# Patient Record
Sex: Male | Born: 1935 | Race: White | Hispanic: No | Marital: Married | Smoking: Current every day smoker
Health system: Southern US, Community
[De-identification: ages and names within clinical notes are randomized; demographics above are authoritative.]

## PROBLEM LIST (undated history)

## (undated) DIAGNOSIS — Z9889 Other specified postprocedural states: Secondary | ICD-10-CM

## (undated) DIAGNOSIS — R112 Nausea with vomiting, unspecified: Secondary | ICD-10-CM

## (undated) DIAGNOSIS — I480 Paroxysmal atrial fibrillation: Secondary | ICD-10-CM

## (undated) DIAGNOSIS — I5032 Chronic diastolic (congestive) heart failure: Secondary | ICD-10-CM

## (undated) DIAGNOSIS — R011 Cardiac murmur, unspecified: Secondary | ICD-10-CM

## (undated) DIAGNOSIS — I10 Essential (primary) hypertension: Secondary | ICD-10-CM

## (undated) DIAGNOSIS — M069 Rheumatoid arthritis, unspecified: Secondary | ICD-10-CM

## (undated) DIAGNOSIS — J189 Pneumonia, unspecified organism: Secondary | ICD-10-CM

## (undated) DIAGNOSIS — I499 Cardiac arrhythmia, unspecified: Secondary | ICD-10-CM

## (undated) HISTORY — PX: PROSTATE SURGERY: SHX751

## (undated) HISTORY — PX: CATARACT EXTRACTION: SUR2

## (undated) HISTORY — DX: Paroxysmal atrial fibrillation: I48.0

## (undated) HISTORY — DX: Chronic diastolic (congestive) heart failure: I50.32

## (undated) HISTORY — DX: Rheumatoid arthritis, unspecified: M06.9

## (undated) HISTORY — DX: Essential (primary) hypertension: I10

## (undated) HISTORY — PX: COLONOSCOPY: SHX174

---

## 2011-03-12 DIAGNOSIS — M069 Rheumatoid arthritis, unspecified: Secondary | ICD-10-CM | POA: Diagnosis not present

## 2011-03-20 DIAGNOSIS — H40059 Ocular hypertension, unspecified eye: Secondary | ICD-10-CM | POA: Diagnosis not present

## 2011-03-20 DIAGNOSIS — H524 Presbyopia: Secondary | ICD-10-CM | POA: Diagnosis not present

## 2011-03-20 DIAGNOSIS — H1045 Other chronic allergic conjunctivitis: Secondary | ICD-10-CM | POA: Diagnosis not present

## 2011-03-20 DIAGNOSIS — H16229 Keratoconjunctivitis sicca, not specified as Sjogren's, unspecified eye: Secondary | ICD-10-CM | POA: Diagnosis not present

## 2011-03-20 DIAGNOSIS — H01009 Unspecified blepharitis unspecified eye, unspecified eyelid: Secondary | ICD-10-CM | POA: Diagnosis not present

## 2011-03-26 DIAGNOSIS — M069 Rheumatoid arthritis, unspecified: Secondary | ICD-10-CM | POA: Diagnosis not present

## 2011-04-12 DIAGNOSIS — M069 Rheumatoid arthritis, unspecified: Secondary | ICD-10-CM | POA: Diagnosis not present

## 2011-04-30 DIAGNOSIS — R918 Other nonspecific abnormal finding of lung field: Secondary | ICD-10-CM | POA: Diagnosis not present

## 2011-04-30 DIAGNOSIS — R5381 Other malaise: Secondary | ICD-10-CM | POA: Diagnosis not present

## 2011-04-30 DIAGNOSIS — J90 Pleural effusion, not elsewhere classified: Secondary | ICD-10-CM | POA: Diagnosis not present

## 2011-04-30 DIAGNOSIS — R5383 Other fatigue: Secondary | ICD-10-CM | POA: Diagnosis not present

## 2011-04-30 DIAGNOSIS — R509 Fever, unspecified: Secondary | ICD-10-CM | POA: Diagnosis not present

## 2011-04-30 DIAGNOSIS — M069 Rheumatoid arthritis, unspecified: Secondary | ICD-10-CM | POA: Diagnosis not present

## 2011-05-07 DIAGNOSIS — H40059 Ocular hypertension, unspecified eye: Secondary | ICD-10-CM | POA: Diagnosis not present

## 2011-05-10 DIAGNOSIS — M069 Rheumatoid arthritis, unspecified: Secondary | ICD-10-CM | POA: Diagnosis not present

## 2011-05-15 DIAGNOSIS — J189 Pneumonia, unspecified organism: Secondary | ICD-10-CM | POA: Diagnosis not present

## 2011-05-24 ENCOUNTER — Ambulatory Visit: Payer: Medicare Other | Admitting: Internal Medicine

## 2011-05-28 DIAGNOSIS — J90 Pleural effusion, not elsewhere classified: Secondary | ICD-10-CM | POA: Diagnosis not present

## 2011-05-28 DIAGNOSIS — R918 Other nonspecific abnormal finding of lung field: Secondary | ICD-10-CM | POA: Diagnosis not present

## 2011-05-28 DIAGNOSIS — J189 Pneumonia, unspecified organism: Secondary | ICD-10-CM | POA: Diagnosis not present

## 2011-06-05 ENCOUNTER — Encounter: Payer: Self-pay | Admitting: Internal Medicine

## 2011-06-05 ENCOUNTER — Ambulatory Visit
Admission: RE | Admit: 2011-06-05 | Discharge: 2011-06-05 | Disposition: A | Discharge status: Home / Self Care | Payer: Medicare Other | Source: Ambulatory Visit | Attending: Internal Medicine | Admitting: Internal Medicine

## 2011-06-05 ENCOUNTER — Ambulatory Visit (INDEPENDENT_AMBULATORY_CARE_PROVIDER_SITE_OTHER): Payer: Medicare Other | Admitting: Internal Medicine

## 2011-06-05 VITALS — BP 148/94 | HR 73 | Temp 97.9°F | Wt 151.0 lb

## 2011-06-05 DIAGNOSIS — H409 Unspecified glaucoma: Secondary | ICD-10-CM

## 2011-06-05 DIAGNOSIS — A150 Tuberculosis of lung: Secondary | ICD-10-CM | POA: Diagnosis not present

## 2011-06-05 DIAGNOSIS — I10 Essential (primary) hypertension: Secondary | ICD-10-CM | POA: Insufficient documentation

## 2011-06-05 DIAGNOSIS — M069 Rheumatoid arthritis, unspecified: Secondary | ICD-10-CM | POA: Insufficient documentation

## 2011-06-05 DIAGNOSIS — Z8701 Personal history of pneumonia (recurrent): Secondary | ICD-10-CM | POA: Diagnosis not present

## 2011-06-05 DIAGNOSIS — Z227 Latent tuberculosis: Secondary | ICD-10-CM

## 2011-06-05 DIAGNOSIS — R918 Other nonspecific abnormal finding of lung field: Secondary | ICD-10-CM | POA: Diagnosis not present

## 2011-06-05 MED ORDER — RIFAMPIN 300 MG PO CAPS: 300.0000 mg | ORAL_CAPSULE | Freq: Two times a day (BID) | ORAL | Status: AC

## 2011-06-05 NOTE — Progress Notes (Signed)
INFECTIOUS DISEASES INITIAL VISIT  RFV: positive quantiferon/latent TB in setting of using remicaide for RA  Subjective:    Patient ID: Cory Gonzalez, male    DOB: September 13, 1929, 78 y.o.   MRN: [id number]  HPI "Cory Gonzalez" is a pleasant 78yo Male with history of HTN, [name], currently on methotrexate and remicaide. He has received 2 doses of remicaide since end of February, but had delayed test results of positive quantiferon. The patient states that he used to work in funeral services and occasionally had to undergo TB skin testing. He states that his arm was often red, not raised, "didn't look right" but always passed. When he was started on enbrel in 2008, he did undergo a TB skin test which was also thought to be unreactive, although red appearing, and went on to getting immunomodulators. He feels that he may have been exposed to TB through his profession, but has never had systemic symptoms of cough, weight loss, nightsweats or fevers. He did however develop pneumonia, presenting with cough and fevers, in late March/early April for which he was treated with a course of levofloxacin for which he has improved completely. He had repeat CXR in late April which showed still residual abnormality from pneumonia.  Meds: mtx 10mg  weekly mvi daily Fish oil 1000mg  daily Amlodipine 10mg  daily Latanoprost QHS Tramadol PRN remicaide 100mg  3mg /kg, Q3months Active Ambulatory Problems    Diagnosis Date Noted  . 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 Rheumatoid arthritis 06/05/2011  . Hypertension 06/05/2011  . Glaucoma 06/05/2011   Resolved Ambulatory Problems    Diagnosis Date Noted  . No Resolved Ambulatory Problems   No Additional Past Medical History     Review of Systems  Constitutional: Negative for fever, chills, diaphoresis, activity change, appetite change, fatigue and unexpected weight change.  HENT: Negative for congestion, sore throat, rhinorrhea, sneezing, trouble  swallowing and sinus pressure.  Eyes: Negative for photophobia and visual disturbance.  Respiratory: Negative for cough, chest tightness, shortness of breath, wheezing and stridor.  Cardiovascular: Negative for chest pain, palpitations and leg swelling.  Gastrointestinal: Negative for nausea, vomiting, abdominal pain, diarrhea, constipation, blood in stool, abdominal distention and anal bleeding.  Genitourinary: Negative for dysuria, hematuria, flank pain and difficulty urinating.  Musculoskeletal: Negative for myalgias, back pain, joint swelling, arthralgias and gait problem.  Skin: Negative for color change, pallor, rash and wound.  Neurological: Negative for dizziness, tremors, weakness and light-headedness.  Hematological: Negative for adenopathy. Does not bruise/bleed easily.  Psychiatric/Behavioral: Negative for behavioral problems, confusion, sleep disturbance, dysphoric mood, decreased concentration and agitation.   Social history = married, lives outside of [place]. Previously worked in [employer]. Smokes 1-2 pipes daily. No drinking. No recent travel.   Family history = has no family status information on file.      Objective:   Physical Exam BP 148/94  Pulse 73  Temp(Src) 97.9 F (36.6 C) (Oral)  Wt 151 lb (68.493 kg)  General Appearance:    Alert, cooperative, no distress, appears stated age  Head:    Normocephalic, without obvious abnormality, atraumatic  Eyes:    PERRL, conjunctiva/corneas clear, EOM's intact,  Ears:    Normal TM's and external ear canals, both ears  Nose:   Nares normal, septum midline, mucosa normal, no drainage   or sinus tenderness  Throat:   Lips, mucosa, and tongue normal; teeth and gums normal  Neck:   Supple, symmetrical, trachea midline, no adenopathy;  Back:     Symmetric, no curvature, ROM normal, no CVA tenderness  Lungs:     Clear to auscultation bilaterally, respirations unlabored     Heart:    Regular rate and rhythm, S1  and S2 normal, no murmur, rub   or gallop  Abdomen:     Soft, non-tender, bowel sounds active all four quadrants,    no masses, no organomegaly        Extremities:   Extremities normal, atraumatic, no cyanosis or edema  Pulses:   2+ and symmetric all extremities  Skin:   Skin color, texture, turgor normal, no rashes or lesions  Lymph nodes:   Cervical, supraclavicular, and axillary nodes normal  Neurologic:   CNII-XII intact. Normal strength, sensation and reflexes      throughout         Assessment & Plan:  Latent tb = by history it appears that patient may have been exposed a few decades ago. His lifetime risk of having active TB is 5% by now, but he is receiving remicaide. We will advise to have him undergo tx for latent tb. we will do rif 300mg  BID x 4 months; will get cxr. Since patient does not have ongoing symptoms of fever, cough, it is less likely he has active TB. We have asked patient to call back if he has new onset fever, nightsweats, cough, jaundice, abdominal pain. Have given him precaution that his urine will become orange due to rifampin.  Drug interaction = we have reviewed his meds that it appears that it is ok to give rifampin with methotrexate.  Rheumatoid arthritis = would ask Dr. Anson [place] to hold on next few doses of remicaide ( next 4 months), until the patient finishes his latent tb treatment  SE surveillance =will check baseline LFTs and next check LFTS on weekly basis to ensure no liver inflammation associated with meds.  rtc in 2 months

## 2011-06-11 DIAGNOSIS — M069 Rheumatoid arthritis, unspecified: Secondary | ICD-10-CM | POA: Diagnosis not present

## 2011-06-11 DIAGNOSIS — R7612 Nonspecific reaction to cell mediated immunity measurement of gamma interferon antigen response without active tuberculosis: Secondary | ICD-10-CM | POA: Diagnosis not present

## 2011-06-29 DIAGNOSIS — R918 Other nonspecific abnormal finding of lung field: Secondary | ICD-10-CM | POA: Diagnosis not present

## 2011-06-29 DIAGNOSIS — J984 Other disorders of lung: Secondary | ICD-10-CM | POA: Diagnosis not present

## 2011-06-29 DIAGNOSIS — J189 Pneumonia, unspecified organism: Secondary | ICD-10-CM | POA: Diagnosis not present

## 2011-07-05 DIAGNOSIS — H40059 Ocular hypertension, unspecified eye: Secondary | ICD-10-CM | POA: Diagnosis not present

## 2011-07-17 DIAGNOSIS — M069 Rheumatoid arthritis, unspecified: Secondary | ICD-10-CM | POA: Diagnosis not present

## 2011-08-06 ENCOUNTER — Ambulatory Visit (INDEPENDENT_AMBULATORY_CARE_PROVIDER_SITE_OTHER): Payer: Medicare Other | Admitting: Internal Medicine

## 2011-08-06 ENCOUNTER — Encounter: Payer: Self-pay | Admitting: Internal Medicine

## 2011-08-06 VITALS — BP 115/66 | HR 62 | Temp 98.6°F | Wt 152.0 lb

## 2011-08-06 DIAGNOSIS — Z227 Latent tuberculosis: Secondary | ICD-10-CM

## 2011-08-06 DIAGNOSIS — A150 Tuberculosis of lung: Secondary | ICD-10-CM

## 2011-08-06 LAB — COMPREHENSIVE METABOLIC PANEL
AST: 27 U/L (ref 0–37)
Albumin: 3.4 g/dL — ABNORMAL LOW (ref 3.5–5.2)
Alkaline Phosphatase: 73 U/L (ref 39–117)
BUN: 10 mg/dL (ref 6–23)
Creat: 0.81 mg/dL (ref 0.50–1.35)
Glucose, Bld: 96 mg/dL (ref 70–99)
Potassium: 3.7 mEq/L (ref 3.5–5.3)

## 2011-08-07 NOTE — Progress Notes (Signed)
ID CLINIC VISIT  RFV: 2 month into latent TB treatment  Subjective:    Patient ID: Cory Gonzalez, male    DOB: 10/05/1929, 78 y.o.   MRN: [id number]  HPI "[name]" is a pleasant 78yo Male with history of HTN, [name], currently on methotrexate and remicaide. He has received 2 doses of remicaide since end of February, but had delayed test results of positive quantiferon. He was seen for the first time in our ID clinic 2 months ago for latent TB treatment and evaluation. The patient was started on rifampin 300mg  BID for which he has been taking regularly without any difficulty. He also took a baseline cxr at that time which showed some residual linear streaking to left upper/middle lobe likely from recent pneumonia. Since being off of remicaide the patient states that he has had 3 bouts of RA attacks, left hand, right hand and left knee where he takes pulse doses prednisone over a week. He last took prednisone roughly 2-3 weeks ago. He denies fever, chills, nightsweats, weight loss. He has a slight productive cough with clear phlegm that he has noticed. Otherwise, no difference in his energy level.  Meds: Rifampin 300mg  BID mtx 10mg  weekly  mvi daily  Fish oil 1000mg  daily  Amlodipine 10mg  daily  Latanoprost QHS  Tramadol PRN   Review of Systems Constitutional: Negative for fever, chills, diaphoresis, activity change, appetite change, fatigue and unexpected weight change.  HENT: Negative for congestion, sore throat, rhinorrhea, sneezing, trouble swallowing and sinus pressure.  Eyes: Negative for photophobia and visual disturbance.  Respiratory: Negative for cough, chest tightness, shortness of breath, wheezing and stridor.  Cardiovascular: Negative for chest pain, palpitations and leg swelling.  Gastrointestinal: Negative for nausea, vomiting, abdominal pain, diarrhea, constipation, blood in stool, abdominal distention and anal bleeding.  Genitourinary: Negative for  dysuria, hematuria, flank pain and difficulty urinating.  Musculoskeletal: + for arthralgias per hpi.  Skin: Negative for color change, pallor, rash and wound.  Neurological: Negative for dizziness, tremors, weakness and light-headedness.  Hematological: Negative for adenopathy. Does not bruise/bleed easily.  Psychiatric/Behavioral: Negative for behavioral problems, confusion, sleep disturbance, dysphoric mood, decreased concentration and agitation.       Objective:   Physical Exam  BP 115/66  Pulse 62  Temp 98.6 F (37 C) (Oral)  Wt 152 lb (68.947 kg) Physical Exam  Constitutional: He is oriented to person, place, and time. He appears well-developed and well-nourished. No distress.  HENT:  Mouth/Throat: Oropharynx is clear and moist. No oropharyngeal exudate.  Cardiovascular: Normal rate, regular rhythm and normal heart sounds. Exam reveals no gallop and no friction rub.  No murmur heard.  Pulmonary/Chest: Effort normal and breath sounds normal. No respiratory distress. He has no wheezes.  Abdominal: Soft. Bowel sounds are normal. He exhibits no distension. There is no tenderness.  Lymphadenopathy:  no cervical adenopathy.  Skin: Skin is warm and dry. No rash noted. No erythema.       Assessment & Plan:  1) latent TB = continue on 2 more months of rifampin to finish out course for latent TB. Will check CMP at this visit,  2) abn cxr = will repeat cxr at next visit to ensure changes from cxr in may are now resolved and will decide if need referral to pulmonology.  rtc in 2 months

## 2011-08-21 DIAGNOSIS — M069 Rheumatoid arthritis, unspecified: Secondary | ICD-10-CM | POA: Diagnosis not present

## 2011-08-21 DIAGNOSIS — R7612 Nonspecific reaction to cell mediated immunity measurement of gamma interferon antigen response without active tuberculosis: Secondary | ICD-10-CM | POA: Diagnosis not present

## 2011-08-31 DIAGNOSIS — M069 Rheumatoid arthritis, unspecified: Secondary | ICD-10-CM | POA: Diagnosis not present

## 2011-09-03 DIAGNOSIS — I10 Essential (primary) hypertension: Secondary | ICD-10-CM | POA: Diagnosis not present

## 2011-09-14 DIAGNOSIS — M069 Rheumatoid arthritis, unspecified: Secondary | ICD-10-CM | POA: Diagnosis not present

## 2011-09-17 DIAGNOSIS — I10 Essential (primary) hypertension: Secondary | ICD-10-CM | POA: Diagnosis not present

## 2011-10-03 DIAGNOSIS — N400 Enlarged prostate without lower urinary tract symptoms: Secondary | ICD-10-CM | POA: Diagnosis not present

## 2011-10-08 ENCOUNTER — Encounter: Payer: Self-pay | Admitting: Internal Medicine

## 2011-10-08 ENCOUNTER — Ambulatory Visit (INDEPENDENT_AMBULATORY_CARE_PROVIDER_SITE_OTHER): Payer: Medicare Other | Admitting: Internal Medicine

## 2011-10-08 ENCOUNTER — Ambulatory Visit
Admission: RE | Admit: 2011-10-08 | Discharge: 2011-10-08 | Disposition: A | Discharge status: Home / Self Care | Payer: Medicare Other | Source: Ambulatory Visit | Attending: Internal Medicine | Admitting: Internal Medicine

## 2011-10-08 VITALS — BP 122/67 | HR 52 | Temp 98.2°F | Wt 149.0 lb

## 2011-10-08 DIAGNOSIS — J449 Chronic obstructive pulmonary disease, unspecified: Secondary | ICD-10-CM | POA: Diagnosis not present

## 2011-10-08 DIAGNOSIS — Z227 Latent tuberculosis: Secondary | ICD-10-CM

## 2011-10-08 DIAGNOSIS — A150 Tuberculosis of lung: Secondary | ICD-10-CM

## 2011-10-08 NOTE — Assessment & Plan Note (Signed)
Patient has finished 4 month course of rifampin without difficulty. Repeat cxr today shows some improvement of lung infiltrate/scarring from his bout of pneumonia in the Spring.   - if needs to be re-screened for TB in the event of using other medications for RA, would recommend screening for pulmonary symptoms and cxr. His quantiferon testing will likely always remain positive.   - please note in his records that he finished a 4 month course of rifampin may to sept 2013 for latent TB

## 2011-10-08 NOTE — Progress Notes (Signed)
INFECTIOUS DISEASES CLINIC  RFV: completion of latent TB tx with rifampin x 4 months Subjective:    Patient ID: Laden Fieldhouse, male    DOB: 13-Nov-1929, 78 y.o.   MRN: [id number]  HPI "[name]" is a pleasant 78yo Male with history of HTN, [name], currently on methotrexate and remicaide. He has received 2 doses of remicaide since end of February, but had delayed test results of positive quantiferon.   He has just finished rifampin 300mg  BID x 4 months, last dose, last night for which he has been taking regularly without any difficulty. Since being off of remicaide the patient states that he has had 3 bouts of RA attacks, left hand, right hand and left knee where he has had 3 rounds of steroids. He was restarted on remicaide over the last month, and 3rd dose at the end of the week, then has a 2 month wait period. He denies fever, chills, nightsweats, weight loss. No cough. Otherwise, no difference in his energy level.  Joints not causing any problems after restarting remicaide. Currently getting tested LFTs q 2months.  Current Outpatient Prescriptions on File Prior to Visit  Medication Sig Dispense Refill  . amLODipine (NORVASC) 10 MG tablet Take 1 tablet by mouth Daily.      . Multiple Vitamin (MULITIVITAMIN WITH MINERALS) TABS Take 1 tablet by mouth daily.       Active Ambulatory Problems    Diagnosis Date Noted  . 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 Rheumatoid arthritis 06/05/2011  . Hypertension 06/05/2011   Resolved Ambulatory Problems    Diagnosis Date Noted  . Glaucoma 06/05/2011   No Additional Past Medical History     Review of Systems Constitutional: Negative for fever, chills, diaphoresis, activity change, appetite change, fatigue and unexpected weight change.  HENT: Negative for congestion, sore throat, rhinorrhea, sneezing, trouble swallowing and sinus pressure.  Eyes: Negative for photophobia and visual disturbance.  Respiratory: Negative for cough, chest  tightness, shortness of breath, wheezing and stridor.  Cardiovascular: Negative for chest pain, palpitations and leg swelling.  Gastrointestinal: Negative for nausea, vomiting, abdominal pain, diarrhea, constipation, blood in stool, abdominal distention and anal bleeding.  Genitourinary: Negative for dysuria, hematuria, flank pain and difficulty urinating.  Musculoskeletal: Negative for myalgias, back pain, joint swelling, arthralgias and gait problem.  Skin: Negative for color change, pallor, rash and wound.  Neurological: Negative for dizziness, tremors, weakness and light-headedness.  Hematological: Negative for adenopathy. Does not bruise/bleed easily.  Psychiatric/Behavioral: Negative for behavioral problems, confusion, sleep disturbance, dysphoric mood, decreased concentration and agitation.       Objective:   Physical Exam BP 122/67  Pulse 52  Temp 98.2 F (36.8 C) (Oral)  Wt 149 lb (67.586 kg) Physical Exam  Constitutional: He is oriented to person, place, and time. He appears well-developed and well-nourished. No distress.  HENT:  Mouth/Throat: Oropharynx is clear and moist. No oropharyngeal exudate.  Cardiovascular: Normal rate, regular rhythm and normal heart sounds.soft early systolic murmur BH apex. Pulmonary/Chest: Effort normal and breath sounds normal. No respiratory distress. He has no wheezes.  Abdominal: Soft. Bowel sounds are normal. He exhibits no distension. There is no tenderness.  Lymphadenopathy:  He has no cervical adenopathy.  Neurological: He is alert and oriented to person, place, and time.  Skin: Skin is warm and dry. No rash noted. No erythema.  Psychiatric: He has a normal mood and affect. His behavior is normal.   Imaging: cxr on 10/08/11: left lung area  looks slightly improved.      Assessment & Plan:  Cc: dr. Len Blalock. In [place] ([facility] . Dr. Dierdre Forth,

## 2011-10-10 DIAGNOSIS — N401 Enlarged prostate with lower urinary tract symptoms: Secondary | ICD-10-CM | POA: Diagnosis not present

## 2011-10-10 DIAGNOSIS — N529 Male erectile dysfunction, unspecified: Secondary | ICD-10-CM | POA: Diagnosis not present

## 2011-10-12 DIAGNOSIS — M069 Rheumatoid arthritis, unspecified: Secondary | ICD-10-CM | POA: Diagnosis not present

## 2011-10-22 DIAGNOSIS — R7612 Nonspecific reaction to cell mediated immunity measurement of gamma interferon antigen response without active tuberculosis: Secondary | ICD-10-CM | POA: Diagnosis not present

## 2011-10-22 DIAGNOSIS — Z23 Encounter for immunization: Secondary | ICD-10-CM | POA: Diagnosis not present

## 2011-10-22 DIAGNOSIS — M069 Rheumatoid arthritis, unspecified: Secondary | ICD-10-CM | POA: Diagnosis not present

## 2011-11-09 DIAGNOSIS — H40059 Ocular hypertension, unspecified eye: Secondary | ICD-10-CM | POA: Diagnosis not present

## 2011-12-07 DIAGNOSIS — M069 Rheumatoid arthritis, unspecified: Secondary | ICD-10-CM | POA: Diagnosis not present

## 2012-01-03 DIAGNOSIS — Z23 Encounter for immunization: Secondary | ICD-10-CM | POA: Diagnosis not present

## 2012-01-03 DIAGNOSIS — F17200 Nicotine dependence, unspecified, uncomplicated: Secondary | ICD-10-CM | POA: Diagnosis not present

## 2012-01-03 DIAGNOSIS — M5137 Other intervertebral disc degeneration, lumbosacral region: Secondary | ICD-10-CM | POA: Diagnosis not present

## 2012-01-03 DIAGNOSIS — N400 Enlarged prostate without lower urinary tract symptoms: Secondary | ICD-10-CM | POA: Diagnosis not present

## 2012-01-03 DIAGNOSIS — I10 Essential (primary) hypertension: Secondary | ICD-10-CM | POA: Diagnosis not present

## 2012-01-17 DIAGNOSIS — Z125 Encounter for screening for malignant neoplasm of prostate: Secondary | ICD-10-CM | POA: Diagnosis not present

## 2012-01-17 DIAGNOSIS — Z23 Encounter for immunization: Secondary | ICD-10-CM | POA: Diagnosis not present

## 2012-01-17 DIAGNOSIS — I10 Essential (primary) hypertension: Secondary | ICD-10-CM | POA: Diagnosis not present

## 2012-01-17 DIAGNOSIS — Z0000 Encounter for general adult medical examination without abnormal findings: Secondary | ICD-10-CM | POA: Diagnosis not present

## 2012-01-17 DIAGNOSIS — M069 Rheumatoid arthritis, unspecified: Secondary | ICD-10-CM | POA: Diagnosis not present

## 2012-01-31 DIAGNOSIS — N400 Enlarged prostate without lower urinary tract symptoms: Secondary | ICD-10-CM | POA: Diagnosis not present

## 2012-01-31 DIAGNOSIS — F17200 Nicotine dependence, unspecified, uncomplicated: Secondary | ICD-10-CM | POA: Diagnosis not present

## 2012-01-31 DIAGNOSIS — R011 Cardiac murmur, unspecified: Secondary | ICD-10-CM | POA: Diagnosis not present

## 2012-01-31 DIAGNOSIS — I10 Essential (primary) hypertension: Secondary | ICD-10-CM | POA: Diagnosis not present

## 2012-01-31 DIAGNOSIS — H6120 Impacted cerumen, unspecified ear: Secondary | ICD-10-CM | POA: Diagnosis not present

## 2012-02-06 DIAGNOSIS — M069 Rheumatoid arthritis, unspecified: Secondary | ICD-10-CM | POA: Diagnosis not present

## 2012-03-20 DIAGNOSIS — H40059 Ocular hypertension, unspecified eye: Secondary | ICD-10-CM | POA: Diagnosis not present

## 2012-04-01 DIAGNOSIS — M069 Rheumatoid arthritis, unspecified: Secondary | ICD-10-CM | POA: Diagnosis not present

## 2012-04-17 DIAGNOSIS — R7612 Nonspecific reaction to cell mediated immunity measurement of gamma interferon antigen response without active tuberculosis: Secondary | ICD-10-CM | POA: Diagnosis not present

## 2012-04-17 DIAGNOSIS — M069 Rheumatoid arthritis, unspecified: Secondary | ICD-10-CM | POA: Diagnosis not present

## 2012-05-27 DIAGNOSIS — M069 Rheumatoid arthritis, unspecified: Secondary | ICD-10-CM | POA: Diagnosis not present

## 2012-07-22 DIAGNOSIS — M069 Rheumatoid arthritis, unspecified: Secondary | ICD-10-CM | POA: Diagnosis not present

## 2012-07-24 DIAGNOSIS — H40059 Ocular hypertension, unspecified eye: Secondary | ICD-10-CM | POA: Diagnosis not present

## 2012-07-31 DIAGNOSIS — I10 Essential (primary) hypertension: Secondary | ICD-10-CM | POA: Diagnosis not present

## 2012-08-07 DIAGNOSIS — I10 Essential (primary) hypertension: Secondary | ICD-10-CM | POA: Diagnosis not present

## 2012-08-07 DIAGNOSIS — N400 Enlarged prostate without lower urinary tract symptoms: Secondary | ICD-10-CM | POA: Diagnosis not present

## 2012-08-07 DIAGNOSIS — F17200 Nicotine dependence, unspecified, uncomplicated: Secondary | ICD-10-CM | POA: Diagnosis not present

## 2012-08-07 DIAGNOSIS — M1990 Unspecified osteoarthritis, unspecified site: Secondary | ICD-10-CM | POA: Diagnosis not present

## 2012-08-23 DIAGNOSIS — W172XXA Fall into hole, initial encounter: Secondary | ICD-10-CM | POA: Diagnosis not present

## 2012-08-23 DIAGNOSIS — M25559 Pain in unspecified hip: Secondary | ICD-10-CM | POA: Diagnosis not present

## 2012-08-23 DIAGNOSIS — S79929A Unspecified injury of unspecified thigh, initial encounter: Secondary | ICD-10-CM | POA: Diagnosis not present

## 2012-08-23 DIAGNOSIS — IMO0001 Reserved for inherently not codable concepts without codable children: Secondary | ICD-10-CM | POA: Diagnosis not present

## 2012-09-16 DIAGNOSIS — T169XXA Foreign body in ear, unspecified ear, initial encounter: Secondary | ICD-10-CM | POA: Diagnosis not present

## 2012-09-16 DIAGNOSIS — M069 Rheumatoid arthritis, unspecified: Secondary | ICD-10-CM | POA: Diagnosis not present

## 2012-09-16 DIAGNOSIS — IMO0002 Reserved for concepts with insufficient information to code with codable children: Secondary | ICD-10-CM | POA: Diagnosis not present

## 2012-10-16 DIAGNOSIS — M25519 Pain in unspecified shoulder: Secondary | ICD-10-CM | POA: Diagnosis not present

## 2012-10-16 DIAGNOSIS — R7612 Nonspecific reaction to cell mediated immunity measurement of gamma interferon antigen response without active tuberculosis: Secondary | ICD-10-CM | POA: Diagnosis not present

## 2012-10-16 DIAGNOSIS — M069 Rheumatoid arthritis, unspecified: Secondary | ICD-10-CM | POA: Diagnosis not present

## 2012-10-23 DIAGNOSIS — N401 Enlarged prostate with lower urinary tract symptoms: Secondary | ICD-10-CM | POA: Diagnosis not present

## 2012-10-30 DIAGNOSIS — N529 Male erectile dysfunction, unspecified: Secondary | ICD-10-CM | POA: Diagnosis not present

## 2012-10-30 DIAGNOSIS — D126 Benign neoplasm of colon, unspecified: Secondary | ICD-10-CM | POA: Diagnosis not present

## 2012-10-30 DIAGNOSIS — N402 Nodular prostate without lower urinary tract symptoms: Secondary | ICD-10-CM | POA: Diagnosis not present

## 2012-10-30 DIAGNOSIS — N401 Enlarged prostate with lower urinary tract symptoms: Secondary | ICD-10-CM | POA: Diagnosis not present

## 2012-11-11 DIAGNOSIS — M069 Rheumatoid arthritis, unspecified: Secondary | ICD-10-CM | POA: Diagnosis not present

## 2012-11-24 DIAGNOSIS — H40059 Ocular hypertension, unspecified eye: Secondary | ICD-10-CM | POA: Diagnosis not present

## 2012-11-26 DIAGNOSIS — M25519 Pain in unspecified shoulder: Secondary | ICD-10-CM | POA: Diagnosis not present

## 2012-11-26 DIAGNOSIS — S43429A Sprain of unspecified rotator cuff capsule, initial encounter: Secondary | ICD-10-CM | POA: Diagnosis not present

## 2012-11-27 DIAGNOSIS — M25519 Pain in unspecified shoulder: Secondary | ICD-10-CM | POA: Diagnosis not present

## 2013-01-06 DIAGNOSIS — M069 Rheumatoid arthritis, unspecified: Secondary | ICD-10-CM | POA: Diagnosis not present

## 2013-02-03 DIAGNOSIS — Z125 Encounter for screening for malignant neoplasm of prostate: Secondary | ICD-10-CM | POA: Diagnosis not present

## 2013-02-03 DIAGNOSIS — Z0000 Encounter for general adult medical examination without abnormal findings: Secondary | ICD-10-CM | POA: Diagnosis not present

## 2013-02-03 DIAGNOSIS — I10 Essential (primary) hypertension: Secondary | ICD-10-CM | POA: Diagnosis not present

## 2013-02-03 DIAGNOSIS — F17200 Nicotine dependence, unspecified, uncomplicated: Secondary | ICD-10-CM | POA: Diagnosis not present

## 2013-02-03 DIAGNOSIS — Z1331 Encounter for screening for depression: Secondary | ICD-10-CM | POA: Diagnosis not present

## 2013-02-12 DIAGNOSIS — N400 Enlarged prostate without lower urinary tract symptoms: Secondary | ICD-10-CM | POA: Diagnosis not present

## 2013-02-12 DIAGNOSIS — N183 Chronic kidney disease, stage 3 unspecified: Secondary | ICD-10-CM | POA: Diagnosis not present

## 2013-02-12 DIAGNOSIS — F17200 Nicotine dependence, unspecified, uncomplicated: Secondary | ICD-10-CM | POA: Diagnosis not present

## 2013-02-12 DIAGNOSIS — I10 Essential (primary) hypertension: Secondary | ICD-10-CM | POA: Diagnosis not present

## 2013-02-12 DIAGNOSIS — R011 Cardiac murmur, unspecified: Secondary | ICD-10-CM | POA: Diagnosis not present

## 2013-02-13 ENCOUNTER — Other Ambulatory Visit (HOSPITAL_COMMUNITY): Payer: Self-pay | Admitting: Internal Medicine

## 2013-02-13 DIAGNOSIS — R011 Cardiac murmur, unspecified: Secondary | ICD-10-CM

## 2013-02-16 DIAGNOSIS — R7612 Nonspecific reaction to cell mediated immunity measurement of gamma interferon antigen response without active tuberculosis: Secondary | ICD-10-CM | POA: Diagnosis not present

## 2013-02-16 DIAGNOSIS — M069 Rheumatoid arthritis, unspecified: Secondary | ICD-10-CM | POA: Diagnosis not present

## 2013-02-16 DIAGNOSIS — M25519 Pain in unspecified shoulder: Secondary | ICD-10-CM | POA: Diagnosis not present

## 2013-02-26 ENCOUNTER — Ambulatory Visit (HOSPITAL_COMMUNITY)
Admission: RE | Admit: 2013-02-26 | Discharge: 2013-02-26 | Disposition: A | Discharge status: Home / Self Care | Payer: Medicare Other | Source: Ambulatory Visit | Attending: Cardiovascular Disease | Admitting: Cardiovascular Disease

## 2013-02-26 DIAGNOSIS — I369 Nonrheumatic tricuspid valve disorder, unspecified: Secondary | ICD-10-CM | POA: Diagnosis not present

## 2013-02-26 DIAGNOSIS — R011 Cardiac murmur, unspecified: Secondary | ICD-10-CM | POA: Diagnosis not present

## 2013-02-26 NOTE — Progress Notes (Signed)
2D Echo Performed 02/26/2013    Makari Sanko, RCS  

## 2013-03-03 DIAGNOSIS — M069 Rheumatoid arthritis, unspecified: Secondary | ICD-10-CM | POA: Diagnosis not present

## 2013-03-23 DIAGNOSIS — H40059 Ocular hypertension, unspecified eye: Secondary | ICD-10-CM | POA: Diagnosis not present

## 2013-03-23 DIAGNOSIS — H26499 Other secondary cataract, unspecified eye: Secondary | ICD-10-CM | POA: Diagnosis not present

## 2013-04-30 DIAGNOSIS — M069 Rheumatoid arthritis, unspecified: Secondary | ICD-10-CM | POA: Diagnosis not present

## 2013-05-07 DIAGNOSIS — M79609 Pain in unspecified limb: Secondary | ICD-10-CM | POA: Diagnosis not present

## 2013-05-14 DIAGNOSIS — M79609 Pain in unspecified limb: Secondary | ICD-10-CM | POA: Diagnosis not present

## 2013-05-14 DIAGNOSIS — S93609A Unspecified sprain of unspecified foot, initial encounter: Secondary | ICD-10-CM | POA: Diagnosis not present

## 2013-06-15 DIAGNOSIS — M7730 Calcaneal spur, unspecified foot: Secondary | ICD-10-CM | POA: Diagnosis not present

## 2013-06-15 DIAGNOSIS — M069 Rheumatoid arthritis, unspecified: Secondary | ICD-10-CM | POA: Diagnosis not present

## 2013-06-15 DIAGNOSIS — M25519 Pain in unspecified shoulder: Secondary | ICD-10-CM | POA: Diagnosis not present

## 2013-06-15 DIAGNOSIS — R7612 Nonspecific reaction to cell mediated immunity measurement of gamma interferon antigen response without active tuberculosis: Secondary | ICD-10-CM | POA: Diagnosis not present

## 2013-06-15 DIAGNOSIS — M79609 Pain in unspecified limb: Secondary | ICD-10-CM | POA: Diagnosis not present

## 2013-06-25 DIAGNOSIS — M069 Rheumatoid arthritis, unspecified: Secondary | ICD-10-CM | POA: Diagnosis not present

## 2013-07-21 DIAGNOSIS — H40059 Ocular hypertension, unspecified eye: Secondary | ICD-10-CM | POA: Diagnosis not present

## 2013-08-13 DIAGNOSIS — I10 Essential (primary) hypertension: Secondary | ICD-10-CM | POA: Diagnosis not present

## 2013-08-18 DIAGNOSIS — M069 Rheumatoid arthritis, unspecified: Secondary | ICD-10-CM | POA: Diagnosis not present

## 2013-08-20 DIAGNOSIS — N400 Enlarged prostate without lower urinary tract symptoms: Secondary | ICD-10-CM | POA: Diagnosis not present

## 2013-08-20 DIAGNOSIS — F17200 Nicotine dependence, unspecified, uncomplicated: Secondary | ICD-10-CM | POA: Diagnosis not present

## 2013-08-20 DIAGNOSIS — N183 Chronic kidney disease, stage 3 unspecified: Secondary | ICD-10-CM | POA: Diagnosis not present

## 2013-08-20 DIAGNOSIS — I10 Essential (primary) hypertension: Secondary | ICD-10-CM | POA: Diagnosis not present

## 2013-09-07 DIAGNOSIS — M25519 Pain in unspecified shoulder: Secondary | ICD-10-CM | POA: Diagnosis not present

## 2013-09-07 DIAGNOSIS — M069 Rheumatoid arthritis, unspecified: Secondary | ICD-10-CM | POA: Diagnosis not present

## 2013-09-07 DIAGNOSIS — R7612 Nonspecific reaction to cell mediated immunity measurement of gamma interferon antigen response without active tuberculosis: Secondary | ICD-10-CM | POA: Diagnosis not present

## 2013-09-07 DIAGNOSIS — L989 Disorder of the skin and subcutaneous tissue, unspecified: Secondary | ICD-10-CM | POA: Diagnosis not present

## 2013-09-21 DIAGNOSIS — D235 Other benign neoplasm of skin of trunk: Secondary | ICD-10-CM | POA: Diagnosis not present

## 2013-09-21 DIAGNOSIS — C44319 Basal cell carcinoma of skin of other parts of face: Secondary | ICD-10-CM | POA: Diagnosis not present

## 2013-09-21 DIAGNOSIS — L821 Other seborrheic keratosis: Secondary | ICD-10-CM | POA: Diagnosis not present

## 2013-09-21 DIAGNOSIS — I781 Nevus, non-neoplastic: Secondary | ICD-10-CM | POA: Diagnosis not present

## 2013-10-13 DIAGNOSIS — M069 Rheumatoid arthritis, unspecified: Secondary | ICD-10-CM | POA: Diagnosis not present

## 2013-10-19 DIAGNOSIS — Z85828 Personal history of other malignant neoplasm of skin: Secondary | ICD-10-CM | POA: Diagnosis not present

## 2013-10-19 DIAGNOSIS — L570 Actinic keratosis: Secondary | ICD-10-CM | POA: Diagnosis not present

## 2013-11-16 DIAGNOSIS — H40053 Ocular hypertension, bilateral: Secondary | ICD-10-CM | POA: Diagnosis not present

## 2013-12-08 DIAGNOSIS — Z23 Encounter for immunization: Secondary | ICD-10-CM | POA: Diagnosis not present

## 2013-12-08 DIAGNOSIS — M0589 Other rheumatoid arthritis with rheumatoid factor of multiple sites: Secondary | ICD-10-CM | POA: Diagnosis not present

## 2013-12-16 DIAGNOSIS — C44519 Basal cell carcinoma of skin of other part of trunk: Secondary | ICD-10-CM | POA: Diagnosis not present

## 2014-01-07 DIAGNOSIS — M0589 Other rheumatoid arthritis with rheumatoid factor of multiple sites: Secondary | ICD-10-CM | POA: Diagnosis not present

## 2014-01-07 DIAGNOSIS — M25511 Pain in right shoulder: Secondary | ICD-10-CM | POA: Diagnosis not present

## 2014-01-07 DIAGNOSIS — R7612 Nonspecific reaction to cell mediated immunity measurement of gamma interferon antigen response without active tuberculosis: Secondary | ICD-10-CM | POA: Diagnosis not present

## 2014-01-07 DIAGNOSIS — M25512 Pain in left shoulder: Secondary | ICD-10-CM | POA: Diagnosis not present

## 2014-02-02 DIAGNOSIS — M0589 Other rheumatoid arthritis with rheumatoid factor of multiple sites: Secondary | ICD-10-CM | POA: Diagnosis not present

## 2014-02-08 DIAGNOSIS — Z85828 Personal history of other malignant neoplasm of skin: Secondary | ICD-10-CM | POA: Diagnosis not present

## 2014-02-08 DIAGNOSIS — Z08 Encounter for follow-up examination after completed treatment for malignant neoplasm: Secondary | ICD-10-CM | POA: Diagnosis not present

## 2014-02-08 DIAGNOSIS — L570 Actinic keratosis: Secondary | ICD-10-CM | POA: Diagnosis not present

## 2014-02-08 DIAGNOSIS — X32XXXD Exposure to sunlight, subsequent encounter: Secondary | ICD-10-CM | POA: Diagnosis not present

## 2014-02-11 DIAGNOSIS — Z125 Encounter for screening for malignant neoplasm of prostate: Secondary | ICD-10-CM | POA: Diagnosis not present

## 2014-02-11 DIAGNOSIS — Z1389 Encounter for screening for other disorder: Secondary | ICD-10-CM | POA: Diagnosis not present

## 2014-02-11 DIAGNOSIS — F17290 Nicotine dependence, other tobacco product, uncomplicated: Secondary | ICD-10-CM | POA: Diagnosis not present

## 2014-02-11 DIAGNOSIS — Z0000 Encounter for general adult medical examination without abnormal findings: Secondary | ICD-10-CM | POA: Diagnosis not present

## 2014-02-11 DIAGNOSIS — E538 Deficiency of other specified B group vitamins: Secondary | ICD-10-CM | POA: Diagnosis not present

## 2014-02-11 DIAGNOSIS — Z23 Encounter for immunization: Secondary | ICD-10-CM | POA: Diagnosis not present

## 2014-02-11 DIAGNOSIS — I10 Essential (primary) hypertension: Secondary | ICD-10-CM | POA: Diagnosis not present

## 2014-02-12 DIAGNOSIS — H4011X4 Primary open-angle glaucoma, indeterminate stage: Secondary | ICD-10-CM | POA: Diagnosis not present

## 2014-02-18 DIAGNOSIS — I10 Essential (primary) hypertension: Secondary | ICD-10-CM | POA: Diagnosis not present

## 2014-02-18 DIAGNOSIS — N183 Chronic kidney disease, stage 3 (moderate): Secondary | ICD-10-CM | POA: Diagnosis not present

## 2014-02-18 DIAGNOSIS — F17290 Nicotine dependence, other tobacco product, uncomplicated: Secondary | ICD-10-CM | POA: Diagnosis not present

## 2014-02-18 DIAGNOSIS — R010 Benign and innocent cardiac murmurs: Secondary | ICD-10-CM | POA: Diagnosis not present

## 2014-02-18 DIAGNOSIS — N400 Enlarged prostate without lower urinary tract symptoms: Secondary | ICD-10-CM | POA: Diagnosis not present

## 2014-03-23 DIAGNOSIS — H40053 Ocular hypertension, bilateral: Secondary | ICD-10-CM | POA: Diagnosis not present

## 2014-03-23 DIAGNOSIS — H1045 Other chronic allergic conjunctivitis: Secondary | ICD-10-CM | POA: Diagnosis not present

## 2014-03-30 DIAGNOSIS — M0589 Other rheumatoid arthritis with rheumatoid factor of multiple sites: Secondary | ICD-10-CM | POA: Diagnosis not present

## 2014-05-10 DIAGNOSIS — C4491 Basal cell carcinoma of skin, unspecified: Secondary | ICD-10-CM | POA: Diagnosis not present

## 2014-05-10 DIAGNOSIS — M150 Primary generalized (osteo)arthritis: Secondary | ICD-10-CM | POA: Diagnosis not present

## 2014-05-10 DIAGNOSIS — M0589 Other rheumatoid arthritis with rheumatoid factor of multiple sites: Secondary | ICD-10-CM | POA: Diagnosis not present

## 2014-05-10 DIAGNOSIS — R7612 Nonspecific reaction to cell mediated immunity measurement of gamma interferon antigen response without active tuberculosis: Secondary | ICD-10-CM | POA: Diagnosis not present

## 2014-05-10 DIAGNOSIS — M25519 Pain in unspecified shoulder: Secondary | ICD-10-CM | POA: Diagnosis not present

## 2014-05-25 DIAGNOSIS — M0589 Other rheumatoid arthritis with rheumatoid factor of multiple sites: Secondary | ICD-10-CM | POA: Diagnosis not present

## 2014-07-21 DIAGNOSIS — M0589 Other rheumatoid arthritis with rheumatoid factor of multiple sites: Secondary | ICD-10-CM | POA: Diagnosis not present

## 2014-07-26 DIAGNOSIS — H40053 Ocular hypertension, bilateral: Secondary | ICD-10-CM | POA: Diagnosis not present

## 2014-08-12 DIAGNOSIS — I10 Essential (primary) hypertension: Secondary | ICD-10-CM | POA: Diagnosis not present

## 2014-08-12 DIAGNOSIS — N400 Enlarged prostate without lower urinary tract symptoms: Secondary | ICD-10-CM | POA: Diagnosis not present

## 2014-08-19 DIAGNOSIS — N183 Chronic kidney disease, stage 3 (moderate): Secondary | ICD-10-CM | POA: Diagnosis not present

## 2014-08-19 DIAGNOSIS — I129 Hypertensive chronic kidney disease with stage 1 through stage 4 chronic kidney disease, or unspecified chronic kidney disease: Secondary | ICD-10-CM | POA: Diagnosis not present

## 2014-08-19 DIAGNOSIS — J449 Chronic obstructive pulmonary disease, unspecified: Secondary | ICD-10-CM | POA: Diagnosis not present

## 2014-08-19 DIAGNOSIS — F17290 Nicotine dependence, other tobacco product, uncomplicated: Secondary | ICD-10-CM | POA: Diagnosis not present

## 2014-08-19 DIAGNOSIS — I5032 Chronic diastolic (congestive) heart failure: Secondary | ICD-10-CM | POA: Diagnosis not present

## 2014-08-24 DIAGNOSIS — M545 Low back pain: Secondary | ICD-10-CM | POA: Diagnosis not present

## 2014-08-24 DIAGNOSIS — M9904 Segmental and somatic dysfunction of sacral region: Secondary | ICD-10-CM | POA: Diagnosis not present

## 2014-08-24 DIAGNOSIS — G541 Lumbosacral plexus disorders: Secondary | ICD-10-CM | POA: Diagnosis not present

## 2014-08-31 DIAGNOSIS — M9904 Segmental and somatic dysfunction of sacral region: Secondary | ICD-10-CM | POA: Diagnosis not present

## 2014-08-31 DIAGNOSIS — M545 Low back pain: Secondary | ICD-10-CM | POA: Diagnosis not present

## 2014-08-31 DIAGNOSIS — M5126 Other intervertebral disc displacement, lumbar region: Secondary | ICD-10-CM | POA: Diagnosis not present

## 2014-08-31 DIAGNOSIS — M5416 Radiculopathy, lumbar region: Secondary | ICD-10-CM | POA: Diagnosis not present

## 2014-09-02 DIAGNOSIS — M0589 Other rheumatoid arthritis with rheumatoid factor of multiple sites: Secondary | ICD-10-CM | POA: Diagnosis not present

## 2014-09-02 DIAGNOSIS — Z79899 Other long term (current) drug therapy: Secondary | ICD-10-CM | POA: Diagnosis not present

## 2014-09-07 DIAGNOSIS — S336XXA Sprain of sacroiliac joint, initial encounter: Secondary | ICD-10-CM | POA: Diagnosis not present

## 2014-09-07 DIAGNOSIS — M545 Low back pain: Secondary | ICD-10-CM | POA: Diagnosis not present

## 2014-09-07 DIAGNOSIS — M5416 Radiculopathy, lumbar region: Secondary | ICD-10-CM | POA: Diagnosis not present

## 2014-09-07 DIAGNOSIS — M9904 Segmental and somatic dysfunction of sacral region: Secondary | ICD-10-CM | POA: Diagnosis not present

## 2014-09-14 DIAGNOSIS — M4806 Spinal stenosis, lumbar region: Secondary | ICD-10-CM | POA: Diagnosis not present

## 2014-09-14 DIAGNOSIS — M9904 Segmental and somatic dysfunction of sacral region: Secondary | ICD-10-CM | POA: Diagnosis not present

## 2014-09-14 DIAGNOSIS — M5126 Other intervertebral disc displacement, lumbar region: Secondary | ICD-10-CM | POA: Diagnosis not present

## 2014-09-14 DIAGNOSIS — M5416 Radiculopathy, lumbar region: Secondary | ICD-10-CM | POA: Diagnosis not present

## 2014-09-14 DIAGNOSIS — M545 Low back pain: Secondary | ICD-10-CM | POA: Diagnosis not present

## 2014-09-15 DIAGNOSIS — M0589 Other rheumatoid arthritis with rheumatoid factor of multiple sites: Secondary | ICD-10-CM | POA: Diagnosis not present

## 2014-09-15 DIAGNOSIS — R7612 Nonspecific reaction to cell mediated immunity measurement of gamma interferon antigen response without active tuberculosis: Secondary | ICD-10-CM | POA: Diagnosis not present

## 2014-09-15 DIAGNOSIS — R5383 Other fatigue: Secondary | ICD-10-CM | POA: Diagnosis not present

## 2014-09-17 DIAGNOSIS — S7002XA Contusion of left hip, initial encounter: Secondary | ICD-10-CM | POA: Diagnosis not present

## 2014-09-21 DIAGNOSIS — M9904 Segmental and somatic dysfunction of sacral region: Secondary | ICD-10-CM | POA: Diagnosis not present

## 2014-09-21 DIAGNOSIS — S336XXA Sprain of sacroiliac joint, initial encounter: Secondary | ICD-10-CM | POA: Diagnosis not present

## 2014-09-21 DIAGNOSIS — M5416 Radiculopathy, lumbar region: Secondary | ICD-10-CM | POA: Diagnosis not present

## 2014-09-21 DIAGNOSIS — M545 Low back pain: Secondary | ICD-10-CM | POA: Diagnosis not present

## 2014-09-28 DIAGNOSIS — M4806 Spinal stenosis, lumbar region: Secondary | ICD-10-CM | POA: Diagnosis not present

## 2014-09-28 DIAGNOSIS — M5126 Other intervertebral disc displacement, lumbar region: Secondary | ICD-10-CM | POA: Diagnosis not present

## 2014-09-28 DIAGNOSIS — M9904 Segmental and somatic dysfunction of sacral region: Secondary | ICD-10-CM | POA: Diagnosis not present

## 2014-09-28 DIAGNOSIS — M545 Low back pain: Secondary | ICD-10-CM | POA: Diagnosis not present

## 2014-09-28 DIAGNOSIS — M5416 Radiculopathy, lumbar region: Secondary | ICD-10-CM | POA: Diagnosis not present

## 2014-10-07 DIAGNOSIS — M797 Fibromyalgia: Secondary | ICD-10-CM | POA: Diagnosis not present

## 2014-10-07 DIAGNOSIS — M9903 Segmental and somatic dysfunction of lumbar region: Secondary | ICD-10-CM | POA: Diagnosis not present

## 2014-10-07 DIAGNOSIS — M545 Low back pain: Secondary | ICD-10-CM | POA: Diagnosis not present

## 2014-10-28 DIAGNOSIS — M4806 Spinal stenosis, lumbar region: Secondary | ICD-10-CM | POA: Diagnosis not present

## 2014-10-28 DIAGNOSIS — M9904 Segmental and somatic dysfunction of sacral region: Secondary | ICD-10-CM | POA: Diagnosis not present

## 2014-10-28 DIAGNOSIS — M5126 Other intervertebral disc displacement, lumbar region: Secondary | ICD-10-CM | POA: Diagnosis not present

## 2014-10-28 DIAGNOSIS — M5416 Radiculopathy, lumbar region: Secondary | ICD-10-CM | POA: Diagnosis not present

## 2014-10-28 DIAGNOSIS — M545 Low back pain: Secondary | ICD-10-CM | POA: Diagnosis not present

## 2014-11-04 DIAGNOSIS — M9904 Segmental and somatic dysfunction of sacral region: Secondary | ICD-10-CM | POA: Diagnosis not present

## 2014-11-04 DIAGNOSIS — M5416 Radiculopathy, lumbar region: Secondary | ICD-10-CM | POA: Diagnosis not present

## 2014-11-04 DIAGNOSIS — M4806 Spinal stenosis, lumbar region: Secondary | ICD-10-CM | POA: Diagnosis not present

## 2014-11-04 DIAGNOSIS — M5126 Other intervertebral disc displacement, lumbar region: Secondary | ICD-10-CM | POA: Diagnosis not present

## 2014-11-10 DIAGNOSIS — M0589 Other rheumatoid arthritis with rheumatoid factor of multiple sites: Secondary | ICD-10-CM | POA: Diagnosis not present

## 2014-11-10 DIAGNOSIS — R5383 Other fatigue: Secondary | ICD-10-CM | POA: Diagnosis not present

## 2014-11-24 DIAGNOSIS — M9904 Segmental and somatic dysfunction of sacral region: Secondary | ICD-10-CM | POA: Diagnosis not present

## 2014-11-24 DIAGNOSIS — M5432 Sciatica, left side: Secondary | ICD-10-CM | POA: Diagnosis not present

## 2014-11-24 DIAGNOSIS — M9903 Segmental and somatic dysfunction of lumbar region: Secondary | ICD-10-CM | POA: Diagnosis not present

## 2014-11-24 DIAGNOSIS — M545 Low back pain: Secondary | ICD-10-CM | POA: Diagnosis not present

## 2014-11-30 DIAGNOSIS — H40053 Ocular hypertension, bilateral: Secondary | ICD-10-CM | POA: Diagnosis not present

## 2014-12-02 ENCOUNTER — Ambulatory Visit (INDEPENDENT_AMBULATORY_CARE_PROVIDER_SITE_OTHER): Payer: Medicare Other | Admitting: Internal Medicine

## 2014-12-02 ENCOUNTER — Encounter: Payer: Self-pay | Admitting: Internal Medicine

## 2014-12-02 ENCOUNTER — Ambulatory Visit
Admission: RE | Admit: 2014-12-02 | Discharge: 2014-12-02 | Disposition: A | Discharge status: Home / Self Care | Payer: Medicare Other | Source: Ambulatory Visit | Attending: Internal Medicine | Admitting: Internal Medicine

## 2014-12-02 VITALS — BP 109/74 | HR 60 | Temp 97.7°F | Ht 66.0 in | Wt 143.0 lb

## 2014-12-02 DIAGNOSIS — R7611 Nonspecific reaction to tuberculin skin test without active tuberculosis: Secondary | ICD-10-CM

## 2014-12-02 DIAGNOSIS — Z227 Latent tuberculosis: Secondary | ICD-10-CM

## 2014-12-02 DIAGNOSIS — J449 Chronic obstructive pulmonary disease, unspecified: Secondary | ICD-10-CM | POA: Diagnosis not present

## 2014-12-02 NOTE — Progress Notes (Signed)
Rfv: latent tb treatment and recent weight loss Subjective:    Patient ID: Cory Gonzalez, male    DOB: [birth year], 81 y.o.   MRN: [id number]  HPI Cory Gonzalez is an 81yo M with Hx of rheumatoid arthritis for which he takes mtx as well as humira injections. He was treated for LTBI in Summer of 2013 for which he finished 4 month course of rifampin. He is establishing care with a new rheumatologist who drew basic labs, hepatitis panel and quantiferon. Since his quantiferon was positive, he was referred back to the ID clinic.  The patient denies any productive cough, fever, chills, nightsweats. Did have 10lb weight loss over the summer, that appears to be intentional  Overall doing well with his health.  I have reviewed his clinic paperwork from rheumatologist and labs recently drawn in mid october  No Known Allergies  Current outpatient prescriptions:  .  amLODipine (NORVASC) 10 MG tablet, Take 1 tablet by mouth Daily., Disp: , Rfl:  .  celecoxib (CELEBREX) 200 MG capsule, , Disp: , Rfl: 0 .  folic acid (FOLVITE) 1 MG tablet, Take 1 mg by mouth 2 (two) times daily., Disp: , Rfl:  .  InFLIXimab (REMICADE IV), Inject into the vein every 8 (eight) weeks., Disp: , Rfl:  .  latanoprost (XALATAN) 0.005 % ophthalmic solution, , Disp: , Rfl: 10 .  lisinopril-hydrochlorothiazide (PRINZIDE,ZESTORETIC) 10-12.5 MG tablet, , Disp: , Rfl: 0 .  methotrexate (RHEUMATREX) 2.5 MG tablet, , Disp: , Rfl: 1 .  Multiple Vitamin (MULITIVITAMIN WITH MINERALS) TABS, Take 1 tablet by mouth daily., Disp: , Rfl:  .  Omega-3 Fatty Acids (FISH OIL) 1000 MG CAPS, Take 1 capsule by mouth daily., Disp: , Rfl:  .  vitamin C (ASCORBIC ACID) 500 MG tablet, Take 500 mg by mouth daily., Disp: , Rfl:   Active Ambulatory Problems    Diagnosis Date Noted  . 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 Rheumatoid arthritis(714.0) 06/05/2011  . Hypertension 06/05/2011   Resolved Ambulatory Problems    Diagnosis Date Noted  . Glaucoma 06/05/2011     No Additional Past Medical History   Social History  Substance Use Topics  . Smoking status: Current Every Day Smoker -- 50 years    Types: Pipe  . Smokeless tobacco: Former Systems developer  . Alcohol Use: No  family history is not on file. no hx of TB in the family Review of Systems  Constitutional: Negative for fever, chills, diaphoresis, activity change, appetite change, fatigue and unexpected weight change.  HENT: Negative for congestion, sore throat, rhinorrhea, sneezing, trouble swallowing and sinus pressure.  Eyes: Negative for photophobia and visual disturbance.  Respiratory: Negative for cough, chest tightness, shortness of breath, wheezing and stridor.  Cardiovascular: Negative for chest pain, palpitations and leg swelling.  Gastrointestinal: Negative for nausea, vomiting, abdominal pain, diarrhea, constipation, blood in stool, abdominal distention and anal bleeding.  Genitourinary: Negative for dysuria, hematuria, flank pain and difficulty urinating.  Musculoskeletal: Negative for myalgias, back pain, joint swelling, arthralgias and gait problem.  Skin: Negative for color change, pallor, rash and wound.  Neurological: Negative for dizziness, tremors, weakness and light-headedness.  Hematological: Negative for adenopathy. Does not bruise/bleed easily.  Psychiatric/Behavioral: Negative for behavioral problems, confusion, sleep disturbance, dysphoric mood, decreased concentration and agitation.       Objective:   Physical Exam BP 109/74 mmHg  Pulse 60  Temp(Src) 97.7 F (36.5 C)  Ht 5\' 6"  (1.676 m)  Wt 143 lb (64.864 kg)  BMI 23.09 kg/m2  Physical Exam  Constitutional: He is oriented to person, place, and time. He appears well-developed and well-nourished. No distress.  HENT:  Mouth/Throat: Oropharynx is clear and moist. No oropharyngeal exudate.  Cardiovascular: Normal rate, regular rhythm and normal heart sounds. Exam reveals no gallop and no friction rub.  No murmur heard.   Pulmonary/Chest: Effort normal and breath sounds normal. No respiratory distress. He has no wheezes.  Lymphadenopathy:  He has no cervical adenopathy.  Neurological: He is alert and oriented to person, place, and time.  Skin: Skin is warm and dry. No rash noted. No erythema.  Psychiatric: He has a normal mood and affect. His behavior is normal.   Imaging 11/3: cxr looks unchanged from prior,  FINDINGS: Stable left and right lung base scarring. Decreased atelectasis left upper lobe with mild residual scarring.  Markedly uncoiling of the aorta unchanged. Heart size normal. Vascular pattern normal. Hyperinflation suggests COPD.  IMPRESSION: No acute findings. COPD with bilateral mild scarring.       Assessment & Plan:  HX of LTBI =Will get cxr tosee if any new lesions that would make one suspect active TB. Presently he does not subscribe to symptoms suggestive of active disease  He finished taking ltbi treatment in 2013, no need to repeat treatment. Can continue with RA treatment  Since CXR is negative, recommend to come back to clinic only if needs to or has symptoms of fevers, chills, nightsweat, SOB, cough

## 2014-12-15 DIAGNOSIS — M797 Fibromyalgia: Secondary | ICD-10-CM | POA: Diagnosis not present

## 2014-12-15 DIAGNOSIS — M545 Low back pain: Secondary | ICD-10-CM | POA: Diagnosis not present

## 2014-12-15 DIAGNOSIS — M9904 Segmental and somatic dysfunction of sacral region: Secondary | ICD-10-CM | POA: Diagnosis not present

## 2014-12-15 DIAGNOSIS — M9903 Segmental and somatic dysfunction of lumbar region: Secondary | ICD-10-CM | POA: Diagnosis not present

## 2014-12-28 DIAGNOSIS — R42 Dizziness and giddiness: Secondary | ICD-10-CM | POA: Diagnosis not present

## 2014-12-28 DIAGNOSIS — I10 Essential (primary) hypertension: Secondary | ICD-10-CM | POA: Diagnosis not present

## 2015-01-03 DIAGNOSIS — Z79899 Other long term (current) drug therapy: Secondary | ICD-10-CM | POA: Diagnosis not present

## 2015-01-03 DIAGNOSIS — M0589 Other rheumatoid arthritis with rheumatoid factor of multiple sites: Secondary | ICD-10-CM | POA: Diagnosis not present

## 2015-01-03 DIAGNOSIS — I129 Hypertensive chronic kidney disease with stage 1 through stage 4 chronic kidney disease, or unspecified chronic kidney disease: Secondary | ICD-10-CM | POA: Diagnosis not present

## 2015-01-03 DIAGNOSIS — M25512 Pain in left shoulder: Secondary | ICD-10-CM | POA: Diagnosis not present

## 2015-01-05 DIAGNOSIS — M4806 Spinal stenosis, lumbar region: Secondary | ICD-10-CM | POA: Diagnosis not present

## 2015-01-05 DIAGNOSIS — M5416 Radiculopathy, lumbar region: Secondary | ICD-10-CM | POA: Diagnosis not present

## 2015-01-05 DIAGNOSIS — M9904 Segmental and somatic dysfunction of sacral region: Secondary | ICD-10-CM | POA: Diagnosis not present

## 2015-01-05 DIAGNOSIS — M545 Low back pain: Secondary | ICD-10-CM | POA: Diagnosis not present

## 2015-02-02 DIAGNOSIS — M797 Fibromyalgia: Secondary | ICD-10-CM | POA: Diagnosis not present

## 2015-02-02 DIAGNOSIS — M9904 Segmental and somatic dysfunction of sacral region: Secondary | ICD-10-CM | POA: Diagnosis not present

## 2015-02-02 DIAGNOSIS — M9903 Segmental and somatic dysfunction of lumbar region: Secondary | ICD-10-CM | POA: Diagnosis not present

## 2015-02-02 DIAGNOSIS — M545 Low back pain: Secondary | ICD-10-CM | POA: Diagnosis not present

## 2015-02-17 DIAGNOSIS — Z0000 Encounter for general adult medical examination without abnormal findings: Secondary | ICD-10-CM | POA: Diagnosis not present

## 2015-02-17 DIAGNOSIS — Z1389 Encounter for screening for other disorder: Secondary | ICD-10-CM | POA: Diagnosis not present

## 2015-02-17 DIAGNOSIS — M25519 Pain in unspecified shoulder: Secondary | ICD-10-CM | POA: Diagnosis not present

## 2015-02-17 DIAGNOSIS — Z125 Encounter for screening for malignant neoplasm of prostate: Secondary | ICD-10-CM | POA: Diagnosis not present

## 2015-02-17 DIAGNOSIS — I129 Hypertensive chronic kidney disease with stage 1 through stage 4 chronic kidney disease, or unspecified chronic kidney disease: Secondary | ICD-10-CM | POA: Diagnosis not present

## 2015-02-17 DIAGNOSIS — M069 Rheumatoid arthritis, unspecified: Secondary | ICD-10-CM | POA: Diagnosis not present

## 2015-02-28 DIAGNOSIS — M0589 Other rheumatoid arthritis with rheumatoid factor of multiple sites: Secondary | ICD-10-CM | POA: Diagnosis not present

## 2015-03-02 DIAGNOSIS — M797 Fibromyalgia: Secondary | ICD-10-CM | POA: Diagnosis not present

## 2015-03-02 DIAGNOSIS — M9904 Segmental and somatic dysfunction of sacral region: Secondary | ICD-10-CM | POA: Diagnosis not present

## 2015-03-02 DIAGNOSIS — M545 Low back pain: Secondary | ICD-10-CM | POA: Diagnosis not present

## 2015-03-02 DIAGNOSIS — M9903 Segmental and somatic dysfunction of lumbar region: Secondary | ICD-10-CM | POA: Diagnosis not present

## 2015-03-30 DIAGNOSIS — M9904 Segmental and somatic dysfunction of sacral region: Secondary | ICD-10-CM | POA: Diagnosis not present

## 2015-03-30 DIAGNOSIS — M9903 Segmental and somatic dysfunction of lumbar region: Secondary | ICD-10-CM | POA: Diagnosis not present

## 2015-03-30 DIAGNOSIS — M797 Fibromyalgia: Secondary | ICD-10-CM | POA: Diagnosis not present

## 2015-03-30 DIAGNOSIS — M545 Low back pain: Secondary | ICD-10-CM | POA: Diagnosis not present

## 2015-04-01 DIAGNOSIS — H52203 Unspecified astigmatism, bilateral: Secondary | ICD-10-CM | POA: Diagnosis not present

## 2015-04-01 DIAGNOSIS — H40053 Ocular hypertension, bilateral: Secondary | ICD-10-CM | POA: Diagnosis not present

## 2015-04-01 DIAGNOSIS — H26492 Other secondary cataract, left eye: Secondary | ICD-10-CM | POA: Diagnosis not present

## 2015-04-01 DIAGNOSIS — Z961 Presence of intraocular lens: Secondary | ICD-10-CM | POA: Diagnosis not present

## 2015-04-04 DIAGNOSIS — M0589 Other rheumatoid arthritis with rheumatoid factor of multiple sites: Secondary | ICD-10-CM | POA: Diagnosis not present

## 2015-04-04 DIAGNOSIS — Z79899 Other long term (current) drug therapy: Secondary | ICD-10-CM | POA: Diagnosis not present

## 2015-04-04 DIAGNOSIS — M25512 Pain in left shoulder: Secondary | ICD-10-CM | POA: Diagnosis not present

## 2015-04-12 DIAGNOSIS — J449 Chronic obstructive pulmonary disease, unspecified: Secondary | ICD-10-CM | POA: Diagnosis not present

## 2015-04-12 DIAGNOSIS — I129 Hypertensive chronic kidney disease with stage 1 through stage 4 chronic kidney disease, or unspecified chronic kidney disease: Secondary | ICD-10-CM | POA: Diagnosis not present

## 2015-04-12 DIAGNOSIS — I4891 Unspecified atrial fibrillation: Secondary | ICD-10-CM | POA: Diagnosis not present

## 2015-04-12 DIAGNOSIS — I499 Cardiac arrhythmia, unspecified: Secondary | ICD-10-CM | POA: Diagnosis not present

## 2015-04-12 DIAGNOSIS — I5032 Chronic diastolic (congestive) heart failure: Secondary | ICD-10-CM | POA: Diagnosis not present

## 2015-04-25 DIAGNOSIS — M0589 Other rheumatoid arthritis with rheumatoid factor of multiple sites: Secondary | ICD-10-CM | POA: Diagnosis not present

## 2015-04-26 ENCOUNTER — Telehealth: Payer: Self-pay | Admitting: Cardiovascular Disease

## 2015-04-26 NOTE — Telephone Encounter (Signed)
Received records from [facility] for appointment on 05/03/15 with Dr Oval Linsey.  Records given to [facility] (medical records) for Dr Blenda Mounts schedule on 05/03/15. lp

## 2015-04-27 DIAGNOSIS — M797 Fibromyalgia: Secondary | ICD-10-CM | POA: Diagnosis not present

## 2015-04-27 DIAGNOSIS — M4806 Spinal stenosis, lumbar region: Secondary | ICD-10-CM | POA: Diagnosis not present

## 2015-04-27 DIAGNOSIS — M9904 Segmental and somatic dysfunction of sacral region: Secondary | ICD-10-CM | POA: Diagnosis not present

## 2015-04-27 DIAGNOSIS — M9903 Segmental and somatic dysfunction of lumbar region: Secondary | ICD-10-CM | POA: Diagnosis not present

## 2015-05-02 NOTE — Progress Notes (Signed)
Cardiology Office Note   Date:  05/03/2015   ID:  Cory Gonzalez, DOB [birth year], MRN [id number]  PCP:  No primary care provider on file.  Cardiologist:   Cory Harness, MD   Chief Complaint  Patient presents with  . New Patient (Initial Visit)    denies cp, sob, le edema, or claudication      History of Present Illness: Cory Gonzalez is a 82 y.o. male with hypertension, chronic diastolic heart failure, COPD, CKD 3 and RA who presents for management of new onset atrial fibrillation. Cory Gonzalez saw his PCP, Dr. Thressa Gonzalez on 04/12/15.  At that appointment he was noted to be in atrial fibrillation with a heart rate of 56. He was started on Xarelto and scheduled to follow-up with cardiology.  He was asymptomatic at the time and continues to deny palpitations, shortness of breath, lightheadedness, or dizziness.  He cut his foot since being on Xarelto and notes that it took a very long time for the stop bleeding. Cory Gonzalez reports at least a year of intermittent episodes of chest pain. The episodes last for a few minutes at a time and typically occur with exertion. It improves with rest. There is no associated shortness of breath, nausea, vomiting, or diaphoresis. The pain is substernal and does not radiate. It is mild in severity. He denies any prior stress testing.  He has not noted any lower extremity edema, orthopnea or PND.    Past Medical History  Diagnosis Date  . Hypertension   . Chronic diastolic heart failure ([place])   . Paroxysmal atrial fibrillation (H[facility])   . Rheumatoid arthritis ([place])     No past surgical history on file.   Current Outpatient Prescriptions  Medication Sig Dispense Refill  . amLODipine (NORVASC) 10 MG tablet Take 1 tablet by mouth Daily.    . celecoxib (CELEBREX) 200 MG capsule Take 200 mg by mouth daily as needed for mild pain.   0  . folic acid (FOLVITE) 1 MG tablet Take 1 mg by mouth 2 (two) times daily.    . InFLIXimab (REMICADE IV) Inject into  the vein every 8 (eight) weeks.    [name] Kitchen latanoprost (XALATAN) 0.005 % ophthalmic solution Place 1 drop into both eyes at bedtime.   10  . lisinopril-hydrochlorothiazide (PRINZIDE,ZESTORETIC) 10-12.5 MG tablet Take 1 tablet by mouth daily.   0  . methotrexate (RHEUMATREX) 2.5 MG tablet Take 10 mg by mouth once a week.   1  . Multiple Vitamin (MULITIVITAMIN WITH MINERALS) TABS Take 1 tablet by mouth daily.    . Omega-3 Fatty Acids (FISH OIL) 1000 MG CAPS Take 1 capsule by mouth daily.    . rivaroxaban (XARELTO) 20 MG TABS tablet Take 1 tablet (20 mg total) by mouth daily with supper. 90 tablet 3  . vitamin C (ASCORBIC ACID) 500 MG tablet Take 500 mg by mouth daily.     No current facility-administered medications for this visit.    Allergies:   Review of patient's allergies indicates no known allergies.    Social History:  The patient  reports that he has been smoking Pipe.  He has quit using smokeless tobacco. He reports that he does not drink alcohol or use illicit drugs.   Family History:  The patient's family history includes Aneurysm in his brother; COPD in his sister; Dementia in his sister.    ROS:  Please see the history of present illness.   Otherwise, review of systems are positive  for none.   All other systems are reviewed and negative.    PHYSICAL EXAM: VS:  BP 140/90 mmHg  Pulse 72  Ht 5\' 6"  (1.676 m)  Wt 64.774 kg (142 lb 12.8 oz)  BMI 23.06 kg/m2 , BMI Body mass index is 23.06 kg/(m^2). GENERAL:  Well appearing HEENT:  Pupils equal round and reactive, fundi not visualized, oral mucosa unremarkable NECK:  No jugular venous distention, waveform within normal limits, carotid upstroke brisk and symmetric, no bruits, no thyromegaly LYMPHATICS:  No cervical adenopathy LUNGS:  Clear to auscultation bilaterally HEART:  RRR.  PMI not displaced or sustained,S1 and S2 within normal limits, no S3, no S4, no clicks, no rubs, I/VI systolic murmur at the RUSB ABD:  Flat, positive bowel  sounds normal in frequency in pitch, no bruits, no rebound, no guarding, no midline pulsatile mass, no hepatomegaly, no splenomegaly EXT:  2 plus pulses throughout, no edema, no cyanosis no clubbing SKIN:  No rashes no nodules NEURO:  Cranial nerves II through XII grossly intact, motor grossly intact throughout PSYCH:  Cognitively intact, oriented to person place and time    EKG:  EKG is ordered today. The ekg ordered today demonstrates sinus rhythm rate 72 bpm.    04/12/15: Atrial fibrillation. Rate 60 bpm. Left axis deviation.  Echo 02/26/13:   Study Conclusions  - Left ventricle: The cavity size was normal. Wall thickness was increased in a pattern of mild LVH. There was mild focal basal hypertrophy of the septum. Systolic function was normal. The estimated ejection fraction was in the range of 55% to 60%. Wall motion was normal; there were no regional wall motion abnormalities. Doppler parameters are consistent with abnormal left ventricular relaxation (grade 1 diastolic dysfunction). - Mitral valve: Calcified annulus. - Left atrium: The atrium was mildly dilated. - Pulmonary arteries: PA peak pressure: 77mm Hg (S).    Recent Labs: No results found for requested labs within last 365 days.   04/25/15: WBC 5.0, hemoglobin 13.1, hematocrit 38, platelets 208 Sodium 136, potassium 4, BUN 24, creatinine 1 AST 31, ALT 27   Lipid Panel No results found for: CHOL, TRIG, HDL, CHOLHDL, VLDL, LDLCALC, LDLDIRECT    Wt Readings from Last 3 Encounters:  05/03/15 64.774 kg (142 lb 12.8 oz)  12/02/14 64.864 kg (143 lb)  10/08/11 67.586 kg (149 lb)      ASSESSMENT AND PLAN:  # Paroxysmal atrial fibrillation:  Cory Gonzalez was found to be in atrial fibrillation when he recently saw his primary care physician. He is not symptomatic. We'll obtain an echocardiogram, TSH, and free T4. He already had basically lives with his primary care physician.  Xarelto as dosed  appropriately for his renal function. He does not require any rate control.  He has been taking Celebrex for rheumatoid arthritis pain. He should not be taking NSAIDs while on oral anticoagulation. He'll discuss this with his rheumatologist. This patients CHA2DS2-VASc Score and unadjusted Ischemic Stroke Rate (% per year) is equal to 3.2 % stroke rate/year from a score of 3 Above score calculated as 1 point each if present [CHF, HTN, DM, Vascular=MI/PAD/Aortic Plaque, Age if 65-74, or Male] Above score calculated as 2 points each if present [Age > 75, or Stroke/TIA/TE]  # Chest pain: Ms. Yuill reports episodes of intermittent chest pain. The symptoms are atypical, but he has several risk factors for ischemia. He is unable to walk on a treadmill. Therefore, we will obtain a Lexiscan Cardiolite to evaluate for ischemia.  Current medicines are reviewed at length with the patient today.  The patient does not have concerns regarding medicines.  The following changes have been made:  no change  Labs/ tests ordered today include:   Orders Placed This Encounter  Procedures  . TSH  . T4, free  . Myocardial Perfusion Imaging  . EKG 12-Lead  . ECHOCARDIOGRAM COMPLETE     Disposition:   FU with Anniya Whiters C. Oval Linsey, MD, [facility] 6 months   This note was written with the assistance of speech recognition software.  Please excuse any transcriptional errors.  Signed, Katya Rolston C. Oval Linsey, MD, [facility]  05/03/2015 11:17 PM    [facility]

## 2015-05-03 ENCOUNTER — Ambulatory Visit (INDEPENDENT_AMBULATORY_CARE_PROVIDER_SITE_OTHER): Payer: Medicare Other | Admitting: Cardiovascular Disease

## 2015-05-03 ENCOUNTER — Encounter: Payer: Self-pay | Admitting: Cardiovascular Disease

## 2015-05-03 VITALS — BP 140/90 | HR 72 | Ht 66.0 in | Wt 142.8 lb

## 2015-05-03 DIAGNOSIS — I4891 Unspecified atrial fibrillation: Secondary | ICD-10-CM | POA: Diagnosis not present

## 2015-05-03 DIAGNOSIS — I480 Paroxysmal atrial fibrillation: Secondary | ICD-10-CM | POA: Insufficient documentation

## 2015-05-03 DIAGNOSIS — I5032 Chronic diastolic (congestive) heart failure: Secondary | ICD-10-CM | POA: Insufficient documentation

## 2015-05-03 DIAGNOSIS — R079 Chest pain, unspecified: Secondary | ICD-10-CM

## 2015-05-03 DIAGNOSIS — M069 Rheumatoid arthritis, unspecified: Secondary | ICD-10-CM | POA: Insufficient documentation

## 2015-05-03 MED ORDER — RIVAROXABAN 20 MG PO TABS: 20.0000 mg | ORAL_TABLET | Freq: Every day | ORAL | Status: DC

## 2015-05-03 NOTE — Patient Instructions (Addendum)
Medication Instructions:  STOP YOUR CELEBREX AND    Labwork: Your physician recommends that you return for lab work AS SOON AS POSSIBLE. The lab can be found on the FIRST FLOOR of out building in [address]    Testing/Procedures: Your physician has requested that you have an echocardiogram. Echocardiography is a painless test that uses sound waves to create images of your heart. It provides your doctor with information about the size and shape of your heart and how well your heart's chambers and valves are working. This procedure takes approximately one hour. There are no restrictions for this procedure.  Your physician has requested that you have a lexiscan myoview. For further information please visit [email or web address]. Please follow instruction sheet, as given.    Follow-Up: Your physician wants you to follow-up in: 6 MONTHS.  You will receive a reminder letter in the mail two months in advance. If you don't receive a letter, please call our office to schedule the follow-up appointment.    If you need a refill on your cardiac medications before your next appointment, please call your pharmacy.   Echocardiogram An echocardiogram, or echocardiography, uses sound waves (ultrasound) to produce an image of your heart. The echocardiogram is simple, painless, obtained within a short period of time, and offers valuable information to your health care provider. The images from an echocardiogram can provide information such as:  Evidence of coronary artery disease (CAD).  Heart size.  Heart muscle function.  Heart valve function.  Aneurysm detection.  Evidence of a past heart attack.  Fluid buildup around the heart.  Heart muscle thickening.  Assess heart valve function. LET [facility] CARE PROVIDER KNOW ABOUT:  Any allergies you have.  All medicines you are taking, including vitamins, herbs, eye drops, creams, and over-the-counter medicines.  Previous problems you or  members of your family have had with the use of anesthetics.  Any blood disorders you have.  Previous surgeries you have had.  Medical conditions you have.  Possibility of pregnancy, if this applies. BEFORE THE PROCEDURE  No special preparation is needed. Eat and drink normally.  PROCEDURE   In order to produce an image of your heart, gel will be applied to your chest and a wand-like tool (transducer) will be moved over your chest. The gel will help transmit the sound waves from the transducer. The sound waves will harmlessly bounce off your heart to allow the heart images to be captured in real-time motion. These images will then be recorded.  You may need an IV to receive a medicine that improves the quality of the pictures. AFTER THE PROCEDURE You may return to your normal schedule including diet, activities, and medicines, unless your health care provider tells you otherwise.   This information is not intended to replace advice given to you by your health care provider. Make sure you discuss any questions you have with your health care provider.   Document Released: 01/13/2000 Document Revised: 02/05/2014 Document Reviewed: 09/22/2012 Elsevier Interactive Patient Education [employer].

## 2015-05-11 ENCOUNTER — Telehealth (HOSPITAL_COMMUNITY): Payer: Self-pay

## 2015-05-11 NOTE — Telephone Encounter (Signed)
Encounter complete. 

## 2015-05-13 ENCOUNTER — Ambulatory Visit (HOSPITAL_COMMUNITY)
Admission: RE | Admit: 2015-05-13 | Discharge: 2015-05-13 | Disposition: A | Discharge status: Home / Self Care | Payer: Medicare Other | Source: Ambulatory Visit | Attending: Cardiovascular Disease | Admitting: Cardiovascular Disease

## 2015-05-13 ENCOUNTER — Ambulatory Visit (HOSPITAL_BASED_OUTPATIENT_CLINIC_OR_DEPARTMENT_OTHER)
Admission: RE | Admit: 2015-05-13 | Discharge: 2015-05-13 | Disposition: A | Discharge status: Home / Self Care | Payer: Medicare Other | Source: Ambulatory Visit | Attending: Cardiovascular Disease | Admitting: Cardiovascular Disease

## 2015-05-13 DIAGNOSIS — I4891 Unspecified atrial fibrillation: Secondary | ICD-10-CM

## 2015-05-13 DIAGNOSIS — J449 Chronic obstructive pulmonary disease, unspecified: Secondary | ICD-10-CM | POA: Diagnosis not present

## 2015-05-13 DIAGNOSIS — I059 Rheumatic mitral valve disease, unspecified: Secondary | ICD-10-CM | POA: Insufficient documentation

## 2015-05-13 DIAGNOSIS — I110 Hypertensive heart disease with heart failure: Secondary | ICD-10-CM | POA: Insufficient documentation

## 2015-05-13 DIAGNOSIS — R079 Chest pain, unspecified: Secondary | ICD-10-CM

## 2015-05-13 DIAGNOSIS — R5383 Other fatigue: Secondary | ICD-10-CM | POA: Insufficient documentation

## 2015-05-13 DIAGNOSIS — I509 Heart failure, unspecified: Secondary | ICD-10-CM | POA: Diagnosis not present

## 2015-05-13 LAB — MYOCARDIAL PERFUSION IMAGING
CHL CUP NUCLEAR SDS: 2
CSEPPHR: 67 {beats}/min
LV dias vol: 78 mL (ref 62–150)
LV sys vol: 27 mL
Rest HR: 48 {beats}/min
SRS: 1
SSS: 3
TID: 1.13

## 2015-05-13 LAB — TSH: TSH: 0.6 m[IU]/L (ref 0.40–4.50)

## 2015-05-13 LAB — ECHOCARDIOGRAM COMPLETE
Height: 66 in
WEIGHTICAEL: 2272 [oz_av]

## 2015-05-13 LAB — T4, FREE: Free T4: 1.3 ng/dL (ref 0.8–1.8)

## 2015-05-13 MED ORDER — TECHNETIUM TC 99M SESTAMIBI GENERIC - CARDIOLITE
28.9000 | Freq: Once | INTRAVENOUS | Status: AC | PRN
  Administered 2015-05-13: 28.9 via INTRAVENOUS

## 2015-05-13 MED ORDER — REGADENOSON 0.4 MG/5ML IV SOLN
0.4000 mg | Freq: Once | INTRAVENOUS | Status: AC
  Administered 2015-05-13: 0.4 mg via INTRAVENOUS

## 2015-05-13 MED ORDER — TECHNETIUM TC 99M SESTAMIBI GENERIC - CARDIOLITE
10.0000 | Freq: Once | INTRAVENOUS | Status: AC | PRN
  Administered 2015-05-13: 10 via INTRAVENOUS

## 2015-05-19 ENCOUNTER — Telehealth: Payer: Self-pay | Admitting: *Deleted

## 2015-05-19 NOTE — Telephone Encounter (Signed)
-----   Message from Skeet Latch, MD sent at 05/17/2015 12:25 PM EDT ----- Normal thyroid function

## 2015-05-19 NOTE — Telephone Encounter (Signed)
-----   Message from Skeet Latch, MD sent at 05/15/2015 10:47 PM EDT ----- Echo shows that his heart does not relax completely.  It will be important keep his blood pressure well-controlled.  Otherwise normal echo.

## 2015-05-19 NOTE — Telephone Encounter (Signed)
-----   Message from Skeet Latch, MD sent at 05/15/2015 10:46 PM EDT ----- Normal stress test

## 2015-05-19 NOTE — Telephone Encounter (Signed)
Advised patient of results.  

## 2015-05-31 ENCOUNTER — Encounter: Payer: Self-pay | Admitting: Cardiovascular Disease

## 2015-06-01 DIAGNOSIS — M545 Low back pain: Secondary | ICD-10-CM | POA: Diagnosis not present

## 2015-06-01 DIAGNOSIS — M797 Fibromyalgia: Secondary | ICD-10-CM | POA: Diagnosis not present

## 2015-06-01 DIAGNOSIS — M9903 Segmental and somatic dysfunction of lumbar region: Secondary | ICD-10-CM | POA: Diagnosis not present

## 2015-06-01 DIAGNOSIS — M9904 Segmental and somatic dysfunction of sacral region: Secondary | ICD-10-CM | POA: Diagnosis not present

## 2015-06-21 DIAGNOSIS — M0589 Other rheumatoid arthritis with rheumatoid factor of multiple sites: Secondary | ICD-10-CM | POA: Diagnosis not present

## 2015-06-29 DIAGNOSIS — M797 Fibromyalgia: Secondary | ICD-10-CM | POA: Diagnosis not present

## 2015-06-29 DIAGNOSIS — M9903 Segmental and somatic dysfunction of lumbar region: Secondary | ICD-10-CM | POA: Diagnosis not present

## 2015-06-29 DIAGNOSIS — M9904 Segmental and somatic dysfunction of sacral region: Secondary | ICD-10-CM | POA: Diagnosis not present

## 2015-06-29 DIAGNOSIS — M545 Low back pain: Secondary | ICD-10-CM | POA: Diagnosis not present

## 2015-07-05 DIAGNOSIS — W57XXXA Bitten or stung by nonvenomous insect and other nonvenomous arthropods, initial encounter: Secondary | ICD-10-CM | POA: Diagnosis not present

## 2015-07-05 DIAGNOSIS — S20461A Insect bite (nonvenomous) of right back wall of thorax, initial encounter: Secondary | ICD-10-CM | POA: Diagnosis not present

## 2015-07-06 NOTE — Addendum Note (Signed)
Addended by: Alvina Filbert B on: 07/06/2015 09:58 AM   Modules accepted: Orders, Medications

## 2015-07-27 DIAGNOSIS — M545 Low back pain: Secondary | ICD-10-CM | POA: Diagnosis not present

## 2015-07-27 DIAGNOSIS — M542 Cervicalgia: Secondary | ICD-10-CM | POA: Diagnosis not present

## 2015-07-27 DIAGNOSIS — M9903 Segmental and somatic dysfunction of lumbar region: Secondary | ICD-10-CM | POA: Diagnosis not present

## 2015-07-27 DIAGNOSIS — M9901 Segmental and somatic dysfunction of cervical region: Secondary | ICD-10-CM | POA: Diagnosis not present

## 2015-08-04 DIAGNOSIS — M25512 Pain in left shoulder: Secondary | ICD-10-CM | POA: Diagnosis not present

## 2015-08-04 DIAGNOSIS — Z79899 Other long term (current) drug therapy: Secondary | ICD-10-CM | POA: Diagnosis not present

## 2015-08-04 DIAGNOSIS — M0589 Other rheumatoid arthritis with rheumatoid factor of multiple sites: Secondary | ICD-10-CM | POA: Diagnosis not present

## 2015-08-08 DIAGNOSIS — H40053 Ocular hypertension, bilateral: Secondary | ICD-10-CM | POA: Diagnosis not present

## 2015-08-16 DIAGNOSIS — M0589 Other rheumatoid arthritis with rheumatoid factor of multiple sites: Secondary | ICD-10-CM | POA: Diagnosis not present

## 2015-08-24 DIAGNOSIS — M545 Low back pain: Secondary | ICD-10-CM | POA: Diagnosis not present

## 2015-08-24 DIAGNOSIS — M9904 Segmental and somatic dysfunction of sacral region: Secondary | ICD-10-CM | POA: Diagnosis not present

## 2015-08-24 DIAGNOSIS — M9903 Segmental and somatic dysfunction of lumbar region: Secondary | ICD-10-CM | POA: Diagnosis not present

## 2015-08-24 DIAGNOSIS — M797 Fibromyalgia: Secondary | ICD-10-CM | POA: Diagnosis not present

## 2015-09-21 DIAGNOSIS — M9901 Segmental and somatic dysfunction of cervical region: Secondary | ICD-10-CM | POA: Diagnosis not present

## 2015-09-21 DIAGNOSIS — M545 Low back pain: Secondary | ICD-10-CM | POA: Diagnosis not present

## 2015-09-21 DIAGNOSIS — M797 Fibromyalgia: Secondary | ICD-10-CM | POA: Diagnosis not present

## 2015-09-21 DIAGNOSIS — M9903 Segmental and somatic dysfunction of lumbar region: Secondary | ICD-10-CM | POA: Diagnosis not present

## 2015-10-06 DIAGNOSIS — I129 Hypertensive chronic kidney disease with stage 1 through stage 4 chronic kidney disease, or unspecified chronic kidney disease: Secondary | ICD-10-CM | POA: Diagnosis not present

## 2015-10-06 DIAGNOSIS — I10 Essential (primary) hypertension: Secondary | ICD-10-CM | POA: Diagnosis not present

## 2015-10-06 DIAGNOSIS — N400 Enlarged prostate without lower urinary tract symptoms: Secondary | ICD-10-CM | POA: Diagnosis not present

## 2015-10-06 DIAGNOSIS — M0589 Other rheumatoid arthritis with rheumatoid factor of multiple sites: Secondary | ICD-10-CM | POA: Diagnosis not present

## 2015-10-11 DIAGNOSIS — M0589 Other rheumatoid arthritis with rheumatoid factor of multiple sites: Secondary | ICD-10-CM | POA: Diagnosis not present

## 2015-10-12 DIAGNOSIS — J449 Chronic obstructive pulmonary disease, unspecified: Secondary | ICD-10-CM | POA: Diagnosis not present

## 2015-10-12 DIAGNOSIS — I129 Hypertensive chronic kidney disease with stage 1 through stage 4 chronic kidney disease, or unspecified chronic kidney disease: Secondary | ICD-10-CM | POA: Diagnosis not present

## 2015-10-12 DIAGNOSIS — N183 Chronic kidney disease, stage 3 (moderate): Secondary | ICD-10-CM | POA: Diagnosis not present

## 2015-10-12 DIAGNOSIS — F17290 Nicotine dependence, other tobacco product, uncomplicated: Secondary | ICD-10-CM | POA: Diagnosis not present

## 2015-10-12 DIAGNOSIS — Z23 Encounter for immunization: Secondary | ICD-10-CM | POA: Diagnosis not present

## 2015-10-12 DIAGNOSIS — I4891 Unspecified atrial fibrillation: Secondary | ICD-10-CM | POA: Diagnosis not present

## 2015-10-18 DIAGNOSIS — M9904 Segmental and somatic dysfunction of sacral region: Secondary | ICD-10-CM | POA: Diagnosis not present

## 2015-10-18 DIAGNOSIS — M797 Fibromyalgia: Secondary | ICD-10-CM | POA: Diagnosis not present

## 2015-10-18 DIAGNOSIS — M545 Low back pain: Secondary | ICD-10-CM | POA: Diagnosis not present

## 2015-10-18 DIAGNOSIS — M9903 Segmental and somatic dysfunction of lumbar region: Secondary | ICD-10-CM | POA: Diagnosis not present

## 2015-11-04 DIAGNOSIS — Z79899 Other long term (current) drug therapy: Secondary | ICD-10-CM | POA: Diagnosis not present

## 2015-11-04 DIAGNOSIS — M0589 Other rheumatoid arthritis with rheumatoid factor of multiple sites: Secondary | ICD-10-CM | POA: Diagnosis not present

## 2015-11-04 DIAGNOSIS — M25512 Pain in left shoulder: Secondary | ICD-10-CM | POA: Diagnosis not present

## 2015-11-10 ENCOUNTER — Ambulatory Visit (INDEPENDENT_AMBULATORY_CARE_PROVIDER_SITE_OTHER): Payer: Medicare Other | Admitting: Cardiovascular Disease

## 2015-11-10 VITALS — BP 139/79 | HR 58 | Ht 65.0 in | Wt 139.4 lb

## 2015-11-10 DIAGNOSIS — I10 Essential (primary) hypertension: Secondary | ICD-10-CM | POA: Diagnosis not present

## 2015-11-10 DIAGNOSIS — I480 Paroxysmal atrial fibrillation: Secondary | ICD-10-CM | POA: Diagnosis not present

## 2015-11-10 NOTE — Patient Instructions (Addendum)

## 2015-11-10 NOTE — Progress Notes (Signed)
Cardiology Office Note   Date:  11/10/2015   ID:  Cory Gonzalez, DOB [birth year], MRN [id number]  PCP:  Cory Sheller, MD  Cardiologist:   Cory Latch, MD   Chief Complaint  Patient presents with  . Follow-up    pt stated he's doing great, pt denied chest pain and SOB      History of Present Illness: Cory Gonzalez is a 82 y.o. male with hypertension, chronic diastolic heart failure, paroxysmal atrial fibrillation, COPD, CKD 3 and RA who presents for follow up.   Cory Gonzalez saw his PCP, Dr. Thressa Gonzalez on 04/12/15 and was noted to be in atrial fibrillation with a heart rate of 56. He was started on Xarelto and scheduled to follow-up with cardiology.  He was seen in clinic 04/2015 and referred for an echo that revealed LVEF 60-65% with grade 1 diastolic dysfunction.  At that appointment he also reported episodes of intermittent atypical chest pain and was referred for [facility] that was negative for ischemia.  Since his last appointment Cory Gonzalez has been doing well.  He denies any chest pain or shortness of breath. He hasn't noted any palpitations, lightheadedness or dizziness.  His main Complaint is that he has pain from arthritis and is no longer able to take Celebrex due to being on Xarelto.  He also wonders if he needs to be on the Xarelto anymore and is scared by the comercials on TV. He denies any bleeding.  Cory Gonzalez likes to garden and mow his lawn.  He has no exertional symptoms or edema.   Past Medical History:  Diagnosis Date  . Chronic diastolic heart failure ([place])   . Hypertension   . Paroxysmal atrial fibrillation ([facility])   . Rheumatoid arthritis [facility])     Past Surgical History:  Procedure Laterality Date  . CATARACT EXTRACTION    . PROSTATE SURGERY       Current Outpatient Prescriptions  Medication Sig Dispense Refill  . amLODipine (NORVASC) 10 MG tablet Take 1 tablet by mouth Daily.    . folic acid (FOLVITE) 1 MG tablet Take 1 mg by mouth 2 (two)  times daily.    . InFLIXimab (REMICADE IV) Inject into the vein every 8 (eight) weeks.    [name] Kitchen latanoprost (XALATAN) 0.005 % ophthalmic solution Place 1 drop into both eyes at bedtime.   10  . lisinopril-hydrochlorothiazide (PRINZIDE,ZESTORETIC) 10-12.5 MG tablet Take 1 tablet by mouth daily.   0  . methotrexate (RHEUMATREX) 2.5 MG tablet Take 10 mg by mouth once a week.   1  . Multiple Vitamin (MULITIVITAMIN WITH MINERALS) TABS Take 1 tablet by mouth daily.    . Omega-3 Fatty Acids (FISH OIL) 1000 MG CAPS Take 1 capsule by mouth daily.    . rivaroxaban (XARELTO) 20 MG TABS tablet Take 1 tablet (20 mg total) by mouth daily with supper. 90 tablet 3  . vitamin C (ASCORBIC ACID) 500 MG tablet Take 500 mg by mouth daily.     No current facility-administered medications for this visit.     Allergies:   Review of patient's allergies indicates no known allergies.    Social History:  The patient  reports that he has been smoking Pipe.  He has smoked for the past 50.00 years. He has quit using smokeless tobacco. He reports that he does not drink alcohol or use drugs.   Family History:  The patient's family history includes Aneurysm in his brother; COPD in his sister; Dementia in his  sister.    ROS:  Please see the history of present illness.   Otherwise, review of systems are positive for none.   All other systems are reviewed and negative.    PHYSICAL EXAM: VS:  BP 139/79   Pulse (!) 58   Ht 5\' 5"  (1.651 m)   Wt 139 lb 6.4 oz (63.2 kg)   BMI 23.20 kg/m  , BMI Body mass index is 23.2 kg/m. GENERAL:  Well appearing HEENT:  Pupils equal round and reactive, fundi not visualized, oral mucosa unremarkable NECK:  No jugular venous distention, waveform within normal limits, carotid upstroke brisk and symmetric, no bruits, no thyromegaly LYMPHATICS:  No cervical adenopathy LUNGS:  Clear to auscultation bilaterally HEART:  RRR.  PMI not displaced or sustained,S1 and S2 within normal limits, no S3, no  S4, no clicks, no rubs, I/VI systolic murmur at the RUSB ABD:  Flat, positive bowel sounds normal in frequency in pitch, no bruits, no rebound, no guarding, no midline pulsatile mass, no hepatomegaly, no splenomegaly EXT:  2 plus pulses throughout, no edema, no cyanosis no clubbing SKIN:  No rashes no nodules NEURO:  Cranial nerves II through XII grossly intact, motor grossly intact throughout PSYCH:  Cognitively intact, oriented to person place and time   EKG:  EKG is not ordered today. The ekg ordered 05/03/15 demonstrates sinus rhythm rate 72 bpm.    04/12/15: Atrial fibrillation. Rate 60 bpm. Left axis deviation.  Echo 05/13/15: LVEF 60-65%.  Grade 1 diastolic dysfunction.  Lexiscan Myoview 05/13/15:   Normal Lexiscan myoview study  There was no ST segment deviation noted during stress.  The study is normal.  This is a low risk study.  Nuclear stress EF: 65%. Normal LV function    Recent Labs: 05/13/2015: TSH 0.60   04/25/15: WBC 5.0, hemoglobin 13.1, hematocrit 38, platelets 208 Sodium 136, potassium 4, BUN 24, creatinine 1 AST 31, ALT 27   Lipid Panel No results found for: CHOL, TRIG, HDL, CHOLHDL, VLDL, LDLCALC, LDLDIRECT    Wt Readings from Last 3 Encounters:  11/10/15 139 lb 6.4 oz (63.2 kg)  05/13/15 142 lb (64.4 kg)  05/03/15 142 lb 12.8 oz (64.8 kg)      ASSESSMENT AND PLAN:  # Paroxysmal atrial fibrillation:  Cory Gonzalez remains asymptomatic. He is currently in sinus rhythm.  Echo was unremarkable. He would like to stop anticoagulation but has agreed to continue Xarelto.  This patients CHA2DS2-VASc Score and unadjusted Ischemic Stroke Rate (% per year) is equal to 3.2 % stroke rate/year from a score of 3 Above score calculated as 1 point each if present [CHF, HTN, DM, Vascular=MI/PAD/Aortic Plaque, Age if 65-74, or Male] Above score calculated as 2 points each if present [Age > 75, or Stroke/TIA/TE]  # Chest pain: Ms. Cory Gonzalez denies recurrent chest pain and  stress was negative.  # Hypertension: Blood pressure well-controlled on amlodipine, lisinopril and HCTZ.   Current medicines are reviewed at length with the patient today.  The patient does not have concerns regarding medicines.  The following changes have been made:  no change  Labs/ tests ordered today include:   No orders of the defined types were placed in this encounter.    Disposition:   FU with Louanna Vanliew C. Oval Linsey, MD, [facility] 6 months   This note was written with the assistance of speech recognition software.  Please excuse any transcriptional errors.  Signed, Plez Belton C. Oval Linsey, MD, [facility]  11/10/2015 9:26 AM    [facility]

## 2015-11-11 ENCOUNTER — Encounter: Payer: Self-pay | Admitting: Cardiovascular Disease

## 2015-11-12 DIAGNOSIS — S20219A Contusion of unspecified front wall of thorax, initial encounter: Secondary | ICD-10-CM | POA: Diagnosis not present

## 2015-11-12 DIAGNOSIS — R918 Other nonspecific abnormal finding of lung field: Secondary | ICD-10-CM | POA: Diagnosis not present

## 2015-11-12 DIAGNOSIS — S7002XA Contusion of left hip, initial encounter: Secondary | ICD-10-CM | POA: Diagnosis not present

## 2015-11-12 DIAGNOSIS — R42 Dizziness and giddiness: Secondary | ICD-10-CM | POA: Diagnosis not present

## 2015-11-12 DIAGNOSIS — S4992XA Unspecified injury of left shoulder and upper arm, initial encounter: Secondary | ICD-10-CM | POA: Diagnosis not present

## 2015-11-12 DIAGNOSIS — M25522 Pain in left elbow: Secondary | ICD-10-CM | POA: Diagnosis not present

## 2015-11-12 DIAGNOSIS — S40812A Abrasion of left upper arm, initial encounter: Secondary | ICD-10-CM | POA: Diagnosis not present

## 2015-11-12 DIAGNOSIS — T148XXA Other injury of unspecified body region, initial encounter: Secondary | ICD-10-CM | POA: Diagnosis not present

## 2015-11-12 DIAGNOSIS — S40012A Contusion of left shoulder, initial encounter: Secondary | ICD-10-CM | POA: Diagnosis not present

## 2015-11-12 DIAGNOSIS — S59902A Unspecified injury of left elbow, initial encounter: Secondary | ICD-10-CM | POA: Diagnosis not present

## 2015-11-12 DIAGNOSIS — M25552 Pain in left hip: Secondary | ICD-10-CM | POA: Diagnosis not present

## 2015-11-12 DIAGNOSIS — Z23 Encounter for immunization: Secondary | ICD-10-CM | POA: Diagnosis not present

## 2015-11-12 DIAGNOSIS — Z79899 Other long term (current) drug therapy: Secondary | ICD-10-CM | POA: Diagnosis not present

## 2015-11-12 DIAGNOSIS — I10 Essential (primary) hypertension: Secondary | ICD-10-CM | POA: Diagnosis not present

## 2015-11-12 DIAGNOSIS — S79912A Unspecified injury of left hip, initial encounter: Secondary | ICD-10-CM | POA: Diagnosis not present

## 2015-11-12 DIAGNOSIS — M25512 Pain in left shoulder: Secondary | ICD-10-CM | POA: Diagnosis not present

## 2015-11-12 DIAGNOSIS — R270 Ataxia, unspecified: Secondary | ICD-10-CM | POA: Diagnosis not present

## 2015-11-13 ENCOUNTER — Encounter: Payer: Self-pay | Admitting: Cardiovascular Disease

## 2015-11-14 ENCOUNTER — Encounter: Payer: Self-pay | Admitting: *Deleted

## 2015-11-14 NOTE — Telephone Encounter (Signed)
This encounter was created in error - please disregard.

## 2015-12-12 DIAGNOSIS — M0589 Other rheumatoid arthritis with rheumatoid factor of multiple sites: Secondary | ICD-10-CM | POA: Diagnosis not present

## 2015-12-12 DIAGNOSIS — R5383 Other fatigue: Secondary | ICD-10-CM | POA: Diagnosis not present

## 2015-12-21 DIAGNOSIS — H40053 Ocular hypertension, bilateral: Secondary | ICD-10-CM | POA: Diagnosis not present

## 2016-02-06 DIAGNOSIS — M0589 Other rheumatoid arthritis with rheumatoid factor of multiple sites: Secondary | ICD-10-CM | POA: Diagnosis not present

## 2016-02-21 DIAGNOSIS — H26492 Other secondary cataract, left eye: Secondary | ICD-10-CM | POA: Diagnosis not present

## 2016-02-21 DIAGNOSIS — H40053 Ocular hypertension, bilateral: Secondary | ICD-10-CM | POA: Diagnosis not present

## 2016-03-08 DIAGNOSIS — Z79899 Other long term (current) drug therapy: Secondary | ICD-10-CM | POA: Diagnosis not present

## 2016-03-08 DIAGNOSIS — M25512 Pain in left shoulder: Secondary | ICD-10-CM | POA: Diagnosis not present

## 2016-03-08 DIAGNOSIS — M0589 Other rheumatoid arthritis with rheumatoid factor of multiple sites: Secondary | ICD-10-CM | POA: Diagnosis not present

## 2016-04-02 DIAGNOSIS — M0589 Other rheumatoid arthritis with rheumatoid factor of multiple sites: Secondary | ICD-10-CM | POA: Diagnosis not present

## 2016-04-03 DIAGNOSIS — I10 Essential (primary) hypertension: Secondary | ICD-10-CM | POA: Diagnosis not present

## 2016-04-03 DIAGNOSIS — Z0000 Encounter for general adult medical examination without abnormal findings: Secondary | ICD-10-CM | POA: Diagnosis not present

## 2016-04-03 DIAGNOSIS — I129 Hypertensive chronic kidney disease with stage 1 through stage 4 chronic kidney disease, or unspecified chronic kidney disease: Secondary | ICD-10-CM | POA: Diagnosis not present

## 2016-04-03 DIAGNOSIS — Z125 Encounter for screening for malignant neoplasm of prostate: Secondary | ICD-10-CM | POA: Diagnosis not present

## 2016-04-11 DIAGNOSIS — I4891 Unspecified atrial fibrillation: Secondary | ICD-10-CM | POA: Diagnosis not present

## 2016-04-11 DIAGNOSIS — I10 Essential (primary) hypertension: Secondary | ICD-10-CM | POA: Diagnosis not present

## 2016-04-11 DIAGNOSIS — J449 Chronic obstructive pulmonary disease, unspecified: Secondary | ICD-10-CM | POA: Diagnosis not present

## 2016-04-11 DIAGNOSIS — Z720 Tobacco use: Secondary | ICD-10-CM | POA: Diagnosis not present

## 2016-04-19 DIAGNOSIS — H01004 Unspecified blepharitis left upper eyelid: Secondary | ICD-10-CM | POA: Diagnosis not present

## 2016-04-19 DIAGNOSIS — H01001 Unspecified blepharitis right upper eyelid: Secondary | ICD-10-CM | POA: Diagnosis not present

## 2016-04-19 DIAGNOSIS — H01005 Unspecified blepharitis left lower eyelid: Secondary | ICD-10-CM | POA: Diagnosis not present

## 2016-04-19 DIAGNOSIS — H01002 Unspecified blepharitis right lower eyelid: Secondary | ICD-10-CM | POA: Diagnosis not present

## 2016-04-19 DIAGNOSIS — H524 Presbyopia: Secondary | ICD-10-CM | POA: Diagnosis not present

## 2016-04-19 DIAGNOSIS — H40053 Ocular hypertension, bilateral: Secondary | ICD-10-CM | POA: Diagnosis not present

## 2016-04-19 DIAGNOSIS — H0100B Unspecified blepharitis left eye, upper and lower eyelids: Secondary | ICD-10-CM | POA: Insufficient documentation

## 2016-04-19 DIAGNOSIS — H52203 Unspecified astigmatism, bilateral: Secondary | ICD-10-CM | POA: Diagnosis not present

## 2016-04-19 DIAGNOSIS — Z961 Presence of intraocular lens: Secondary | ICD-10-CM | POA: Insufficient documentation

## 2016-05-29 DIAGNOSIS — M0589 Other rheumatoid arthritis with rheumatoid factor of multiple sites: Secondary | ICD-10-CM | POA: Diagnosis not present

## 2016-07-02 DIAGNOSIS — S80862A Insect bite (nonvenomous), left lower leg, initial encounter: Secondary | ICD-10-CM | POA: Diagnosis not present

## 2016-07-09 DIAGNOSIS — W57XXXD Bitten or stung by nonvenomous insect and other nonvenomous arthropods, subsequent encounter: Secondary | ICD-10-CM | POA: Diagnosis not present

## 2016-07-09 DIAGNOSIS — M25512 Pain in left shoulder: Secondary | ICD-10-CM | POA: Diagnosis not present

## 2016-07-09 DIAGNOSIS — Z79899 Other long term (current) drug therapy: Secondary | ICD-10-CM | POA: Diagnosis not present

## 2016-07-09 DIAGNOSIS — M0589 Other rheumatoid arthritis with rheumatoid factor of multiple sites: Secondary | ICD-10-CM | POA: Diagnosis not present

## 2016-07-25 DIAGNOSIS — M0589 Other rheumatoid arthritis with rheumatoid factor of multiple sites: Secondary | ICD-10-CM | POA: Diagnosis not present

## 2016-08-27 DIAGNOSIS — H40053 Ocular hypertension, bilateral: Secondary | ICD-10-CM | POA: Diagnosis not present

## 2016-09-20 DIAGNOSIS — M0589 Other rheumatoid arthritis with rheumatoid factor of multiple sites: Secondary | ICD-10-CM | POA: Diagnosis not present

## 2016-10-12 DIAGNOSIS — I5032 Chronic diastolic (congestive) heart failure: Secondary | ICD-10-CM | POA: Diagnosis not present

## 2016-10-12 DIAGNOSIS — N183 Chronic kidney disease, stage 3 (moderate): Secondary | ICD-10-CM | POA: Diagnosis not present

## 2016-10-12 DIAGNOSIS — I129 Hypertensive chronic kidney disease with stage 1 through stage 4 chronic kidney disease, or unspecified chronic kidney disease: Secondary | ICD-10-CM | POA: Diagnosis not present

## 2016-10-12 DIAGNOSIS — Z23 Encounter for immunization: Secondary | ICD-10-CM | POA: Diagnosis not present

## 2016-10-12 DIAGNOSIS — M150 Primary generalized (osteo)arthritis: Secondary | ICD-10-CM | POA: Diagnosis not present

## 2016-11-08 DIAGNOSIS — Z79899 Other long term (current) drug therapy: Secondary | ICD-10-CM | POA: Diagnosis not present

## 2016-11-08 DIAGNOSIS — M0589 Other rheumatoid arthritis with rheumatoid factor of multiple sites: Secondary | ICD-10-CM | POA: Diagnosis not present

## 2016-11-08 DIAGNOSIS — M4120 Other idiopathic scoliosis, site unspecified: Secondary | ICD-10-CM | POA: Diagnosis not present

## 2016-11-15 DIAGNOSIS — M0589 Other rheumatoid arthritis with rheumatoid factor of multiple sites: Secondary | ICD-10-CM | POA: Diagnosis not present

## 2016-12-07 ENCOUNTER — Encounter (HOSPITAL_BASED_OUTPATIENT_CLINIC_OR_DEPARTMENT_OTHER): Payer: Self-pay | Admitting: *Deleted

## 2016-12-07 ENCOUNTER — Emergency Department (HOSPITAL_BASED_OUTPATIENT_CLINIC_OR_DEPARTMENT_OTHER)
Admission: EM | Admit: 2016-12-07 | Discharge: 2016-12-07 | Disposition: A | Discharge status: Home / Self Care | Payer: Medicare Other | Attending: Emergency Medicine | Admitting: Emergency Medicine

## 2016-12-07 ENCOUNTER — Emergency Department (HOSPITAL_BASED_OUTPATIENT_CLINIC_OR_DEPARTMENT_OTHER): Payer: Medicare Other

## 2016-12-07 ENCOUNTER — Other Ambulatory Visit: Payer: Self-pay

## 2016-12-07 DIAGNOSIS — Z79899 Other long term (current) drug therapy: Secondary | ICD-10-CM | POA: Insufficient documentation

## 2016-12-07 DIAGNOSIS — I110 Hypertensive heart disease with heart failure: Secondary | ICD-10-CM | POA: Diagnosis not present

## 2016-12-07 DIAGNOSIS — F17290 Nicotine dependence, other tobacco product, uncomplicated: Secondary | ICD-10-CM | POA: Insufficient documentation

## 2016-12-07 DIAGNOSIS — R1030 Lower abdominal pain, unspecified: Secondary | ICD-10-CM | POA: Diagnosis not present

## 2016-12-07 DIAGNOSIS — I5032 Chronic diastolic (congestive) heart failure: Secondary | ICD-10-CM | POA: Diagnosis not present

## 2016-12-07 DIAGNOSIS — K449 Diaphragmatic hernia without obstruction or gangrene: Secondary | ICD-10-CM | POA: Diagnosis not present

## 2016-12-07 DIAGNOSIS — L03113 Cellulitis of right upper limb: Secondary | ICD-10-CM | POA: Insufficient documentation

## 2016-12-07 LAB — TROPONIN I: Troponin I: <0.03 ng/mL (ref <0.03)

## 2016-12-07 LAB — COMPREHENSIVE METABOLIC PANEL
ALK PHOS: 60 U/L (ref 38–126)
ALT: 21 U/L (ref 17–63)
AST: 41 U/L (ref 15–41)
Albumin: 4.1 g/dL (ref 3.5–5.0)
Anion gap: 9 (ref 5–15)
BUN: 15 mg/dL (ref 6–20)
CALCIUM: 9.1 mg/dL (ref 8.9–10.3)
CO2: 24 mmol/L (ref 22–32)
Chloride: 101 mmol/L (ref 101–111)
Creatinine, Ser: 1.11 mg/dL (ref 0.61–1.24)
GFR calc non Af Amer: 59 mL/min — ABNORMAL LOW (ref >60)
GLUCOSE: 121 mg/dL — AB (ref 65–99)
Potassium: 3.5 mmol/L (ref 3.5–5.1)
Sodium: 134 mmol/L — ABNORMAL LOW (ref 135–145)
Total Bilirubin: 1 mg/dL (ref 0.3–1.2)
Total Protein: 7.3 g/dL (ref 6.5–8.1)

## 2016-12-07 LAB — URINALYSIS, ROUTINE W REFLEX MICROSCOPIC
Bilirubin Urine: NEGATIVE
Glucose, UA: NEGATIVE mg/dL
Hgb urine dipstick: NEGATIVE
Ketones, ur: NEGATIVE mg/dL
LEUKOCYTES UA: NEGATIVE
NITRITE: NEGATIVE
PH: 6.5 (ref 5.0–8.0)
Protein, ur: NEGATIVE mg/dL

## 2016-12-07 LAB — CBC WITH DIFFERENTIAL/PLATELET
Basophils Absolute: 0 10*3/uL (ref 0.0–0.1)
Basophils Relative: 0 %
Eosinophils Absolute: 0 10*3/uL (ref 0.0–0.7)
Eosinophils Relative: 0 %
HEMATOCRIT: 41.1 % (ref 39.0–52.0)
Hemoglobin: 14.4 g/dL (ref 13.0–17.0)
LYMPHS ABS: 1 10*3/uL (ref 0.7–4.0)
LYMPHS PCT: 9 %
MCH: 35.6 pg — ABNORMAL HIGH (ref 26.0–34.0)
MCHC: 35 g/dL (ref 30.0–36.0)
MCV: 101.7 fL — AB (ref 78.0–100.0)
MONO ABS: 1.1 10*3/uL — AB (ref 0.1–1.0)
MONOS PCT: 9 %
Neutro Abs: 9.8 10*3/uL — ABNORMAL HIGH (ref 1.7–7.7)
Neutrophils Relative %: 82 %
Platelets: 188 10*3/uL (ref 150–400)
RBC: 4.04 MIL/uL — ABNORMAL LOW (ref 4.22–5.81)
RDW: 13.4 % (ref 11.5–15.5)
WBC: 11.9 10*3/uL — ABNORMAL HIGH (ref 4.0–10.5)

## 2016-12-07 LAB — LIPASE, BLOOD: Lipase: 41 U/L (ref 11–51)

## 2016-12-07 MED ORDER — CEPHALEXIN 500 MG PO CAPS: 1000.0000 mg | ORAL_CAPSULE | Freq: Two times a day (BID) | ORAL | 0 refills | Status: DC

## 2016-12-07 MED ORDER — IOPAMIDOL (ISOVUE-300) INJECTION 61%
100.0000 mL | Freq: Once | INTRAVENOUS | Status: AC | PRN
  Administered 2016-12-07: 100 mL via INTRAVENOUS

## 2016-12-07 MED FILL — CEPHALEXIN 500 MG CAPSULE: 500 | 7 days supply | Qty: 28 | Fill #0

## 2016-12-07 NOTE — ED Notes (Signed)
Pt on monitor 

## 2016-12-07 NOTE — ED Triage Notes (Signed)
Pt c/o diffuse abd pain x 2 days, also c/o right hand redness and swelling x 2 days

## 2016-12-07 NOTE — ED Notes (Signed)
Patient transported to CT 

## 2016-12-07 NOTE — ED Provider Notes (Signed)
[facility] EMERGENCY DEPARTMENT Provider Note   CSN: [id number] Arrival date & time: 12/07/16  1316     History   Chief Complaint Chief Complaint  Patient presents with  . Abdominal Pain    HPI Chukwuma Straus is a 83 y.o. male.  HPI Patient has had abdominal pain periodically over about the past month.  He reports typically is in his lower abdomen.  This morning it was fairly intense.  There was no associated vomiting, diarrhea.  Patient reports occasionally has constipation.  No urinary incontinence burning or urgency.  No fever.  Ports at this time it has improved significantly.  Patient also is having swelling and redness of his right hand.  He scraped it mowing the lawn about a week ago.  His wife has been cleaning it and putting antibiotic ointment on it.  She reports the wound seems to be healing well but starting last night today it started becoming swollen and red.  He has not developed any fever or general malaise. Past Medical History:  Diagnosis Date  . Chronic diastolic heart failure ([place])   . Hypertension   . Paroxysmal atrial fibrillation (HCC)   . Rheumatoid arthritis ([place])     There are no active problems to display for this patient.   Past Surgical History:  Procedure Laterality Date  . CATARACT EXTRACTION    . PROSTATE SURGERY         Home Medications    Prior to Admission medications   Medication Sig Start Date End Date Taking? Authorizing Provider  amLODipine (NORVASC) 10 MG tablet Take 1 tablet by mouth Daily. 08/06/11   [provider]  cephALEXin (KEFLEX) 500 MG capsule Take 2 capsules (1,000 mg total) 2 (two) times daily by mouth. 12/07/16   Charlesetta Shanks, MD  folic acid (FOLVITE) 1 MG tablet Take 1 mg by mouth 2 (two) times daily.    [provider]  InFLIXimab (REMICADE IV) Inject into the vein every 8 (eight) weeks.    [provider]  latanoprost (XALATAN) 0.005 % ophthalmic solution Place 1 drop into both  eyes at bedtime.  11/01/14   [provider]  lisinopril-hydrochlorothiazide (PRINZIDE,ZESTORETIC) 20-12.5 MG tablet Take 1 tablet by mouth daily. 11/03/15   [provider]  methotrexate (RHEUMATREX) 2.5 MG tablet Take 10 mg by mouth once a week.  10/06/14   [provider]  Multiple Vitamin (MULITIVITAMIN WITH MINERALS) TABS Take 1 tablet by mouth daily.    [provider]  Omega-3 Fatty Acids (FISH OIL) 1000 MG CAPS Take 1 capsule by mouth daily.    [provider]  rivaroxaban (XARELTO) 20 MG TABS tablet Take 1 tablet (20 mg total) by mouth daily with supper. 05/03/15   Skeet Latch, MD  vitamin C (ASCORBIC ACID) 500 MG tablet Take 500 mg by mouth daily.    [provider]    Family History Family History  Problem Relation Age of Onset  . COPD Sister   . Dementia Sister   . Aneurysm Brother     Social History Social History   Tobacco Use  . Smoking status: Current Every Day Smoker    Years: 50.00    Types: Pipe  . Smokeless tobacco: Former Network engineer Use Topics  . Alcohol use: No    Alcohol/week: 0.0 oz  . Drug use: No     Allergies   Patient has no known allergies.   Review of Systems Review of Systems 10 Systems  reviewed and are negative for acute change except as noted in the HPI.   Physical Exam Updated Vital Signs BP 127/83   Pulse 73   Temp 99.3 F (37.4 C)   Resp 17   Ht 5\' 4"  (1.626 m)   Wt 63 kg (139 lb)   SpO2 94%   BMI 23.86 kg/m   Physical Exam  Constitutional: He is oriented to person, place, and time. He appears well-developed and well-nourished. He does not appear ill.  HENT:  Head: Normocephalic and atraumatic.  Mouth/Throat: Oropharynx is clear and moist. No oropharyngeal exudate.  Tongue has brownish white plaque on it.  Posterior oropharynx is widely patent.  Eyes: EOM are normal. Pupils are equal, round, and reactive to light.  Neck: Neck supple.  Cardiovascular: Normal  rate, regular rhythm, normal heart sounds and intact distal pulses.  Pulmonary/Chest: Effort normal and breath sounds normal. No respiratory distress.  Abdominal: Soft. Bowel sounds are normal. He exhibits no distension. There is tenderness.  Mild suprapubic tenderness.  No guarding no mass.  Genitourinary:  Genitourinary Comments: Patient has penile prosthesis.  Swelling at the base of the penis.  Musculoskeletal: Normal range of motion. He exhibits edema and tenderness.  Has swelling and erythema of the dorsum of the right hand.  There is a healing abrasion.  No swelling that encompasses the palmar surface or the volar surface of the forearm. good Range of motion of the hand patient can flex and extend the fingers without significant pain.  See attached images.  Neurological: He is alert and oriented to person, place, and time. He has normal strength. No cranial nerve deficit. He exhibits normal muscle tone. Coordination normal. GCS eye subscore is 4. GCS verbal subscore is 5. GCS motor subscore is 6.  Skin: Skin is warm, dry and intact.  Psychiatric: He has a normal mood and affect.           ED Treatments / Results  Labs (all labs ordered are listed, but only abnormal results are displayed) Labs Reviewed  URINALYSIS, ROUTINE W REFLEX MICROSCOPIC - Abnormal; Notable for the following components:      Result Value   Specific Gravity, Urine <1.005 (*)    All other components within normal limits  CBC WITH DIFFERENTIAL/PLATELET - Abnormal; Notable for the following components:   WBC 11.9 (*)    RBC 4.04 (*)    MCV 101.7 (*)    MCH 35.6 (*)    Neutro Abs 9.8 (*)    Monocytes Absolute 1.1 (*)    All other components within normal limits  COMPREHENSIVE METABOLIC PANEL - Abnormal; Notable for the following components:   Sodium 134 (*)    Glucose, Bld 121 (*)    GFR calc non Af Amer 59 (*)    All other components within normal limits  LIPASE, BLOOD  TROPONIN I    EKG  EKG  Interpretation None       Radiology Ct Abdomen Pelvis W Contrast  Result Date: 12/07/2016 CLINICAL DATA:  Diffuse abdominal pain for 2 days. EXAM: CT ABDOMEN AND PELVIS WITH CONTRAST TECHNIQUE: Multidetector CT imaging of the abdomen and pelvis was performed using the standard protocol following bolus administration of intravenous contrast. CONTRAST:  124mL ISOVUE-300 IOPAMIDOL (ISOVUE-300) INJECTION 61% COMPARISON:  None. FINDINGS: Lower chest: Fat containing right posterior diaphragmatic hernia. Small hiatal hernia. Hepatobiliary: No focal liver abnormality is seen. No gallstones, gallbladder wall thickening, or biliary dilatation. Pancreas: Unremarkable. No pancreatic ductal dilatation or surrounding inflammatory  changes. Spleen: Normal in size without focal abnormality. Adrenals/Urinary Tract: Normal adrenal glands. 3.2 cm left renal cyst. Other smaller, too small to be accurately characterized by CT hypoattenuated lesions throughout both kidneys. No evidence of hydronephrosis. Stomach/Bowel: Stomach is within normal limits. Appendix appears normal. No evidence of bowel wall thickening, distention, or inflammatory changes. Vascular/Lymphatic: Aortic atherosclerosis and tortuosity. No enlarged abdominal or pelvic lymph nodes. Reproductive: Heterogeneous prostate gland. Penile prosthesis in place. Other: No abdominal wall hernia or abnormality. No abdominopelvic ascites. Musculoskeletal: Reverse S-shaped scoliosis with moderate to severe osteoarthritic changes of the lumbosacral spine. IMPRESSION: Small hiatal hernia. Otherwise no evidence of acute abnormalities within the gastrointestinal tract. Calcific atherosclerotic disease and tortuosity of the aorta. Heterogeneous appearance of the prostate gland. Please correlate to serum PSA values. Electronically Signed   By: Fidela Salisbury M.D.   On: 12/07/2016 15:55    Procedures Procedures (including critical care time)  Medications Ordered in  ED Medications  iopamidol (ISOVUE-300) 61 % injection 100 mL (100 mLs Intravenous Contrast Given 12/07/16 1526)     Initial Impression / Assessment and Plan / ED Course  I have reviewed the triage vital signs and the nursing notes.  Pertinent labs & imaging results that were available during my care of the patient were reviewed by me and considered in my medical decision making (see chart for details).  Clinical Course as of Dec 08 1631  Fri Dec 07, 2016  1453 BUN: 15 [MP]    Clinical Course User Index [MP] Charlesetta Shanks, MD     Final Clinical Impressions(s) / ED Diagnoses   Final diagnoses:  Lower abdominal pain  Cellulitis of right upper extremity  Patient presented with lower abdominal pain that has been sporadic.  It is actually resolved at this time.  CT does not show any acute findings.  Patient will continue to follow-up with his PCP regarding this issue. Patient also has cellulitis of the dorsum of the right hand.  He sustained an abrasion a week ago and swelling and redness of just started.  Patient has excellent range of motion of the hand he can flex and extend the fingers without any discomfort.  Will initiate Keflex at this time.  They have been counseled on the necessity to return should he develop any significant pain or difficulty with moving his hand.  ED Discharge Orders        Ordered    cephALEXin (KEFLEX) 500 MG capsule  2 times daily     12/07/16 1623       Charlesetta Shanks, MD 12/07/16 [phone]

## 2016-12-12 DIAGNOSIS — L570 Actinic keratosis: Secondary | ICD-10-CM | POA: Diagnosis not present

## 2016-12-12 DIAGNOSIS — X32XXXD Exposure to sunlight, subsequent encounter: Secondary | ICD-10-CM | POA: Diagnosis not present

## 2016-12-12 DIAGNOSIS — C44311 Basal cell carcinoma of skin of nose: Secondary | ICD-10-CM | POA: Diagnosis not present

## 2016-12-24 DIAGNOSIS — H40053 Ocular hypertension, bilateral: Secondary | ICD-10-CM | POA: Diagnosis not present

## 2017-01-09 DIAGNOSIS — X32XXXD Exposure to sunlight, subsequent encounter: Secondary | ICD-10-CM | POA: Diagnosis not present

## 2017-01-09 DIAGNOSIS — Z85828 Personal history of other malignant neoplasm of skin: Secondary | ICD-10-CM | POA: Diagnosis not present

## 2017-01-09 DIAGNOSIS — Z08 Encounter for follow-up examination after completed treatment for malignant neoplasm: Secondary | ICD-10-CM | POA: Diagnosis not present

## 2017-01-09 DIAGNOSIS — L570 Actinic keratosis: Secondary | ICD-10-CM | POA: Diagnosis not present

## 2017-01-10 DIAGNOSIS — M0589 Other rheumatoid arthritis with rheumatoid factor of multiple sites: Secondary | ICD-10-CM | POA: Diagnosis not present

## 2017-02-19 DIAGNOSIS — H40053 Ocular hypertension, bilateral: Secondary | ICD-10-CM | POA: Diagnosis not present

## 2017-02-19 DIAGNOSIS — H26492 Other secondary cataract, left eye: Secondary | ICD-10-CM | POA: Diagnosis not present

## 2017-03-07 DIAGNOSIS — M0589 Other rheumatoid arthritis with rheumatoid factor of multiple sites: Secondary | ICD-10-CM | POA: Diagnosis not present

## 2017-03-13 DIAGNOSIS — M4120 Other idiopathic scoliosis, site unspecified: Secondary | ICD-10-CM | POA: Diagnosis not present

## 2017-03-13 DIAGNOSIS — Z79899 Other long term (current) drug therapy: Secondary | ICD-10-CM | POA: Diagnosis not present

## 2017-03-13 DIAGNOSIS — M0589 Other rheumatoid arthritis with rheumatoid factor of multiple sites: Secondary | ICD-10-CM | POA: Diagnosis not present

## 2017-03-13 DIAGNOSIS — R42 Dizziness and giddiness: Secondary | ICD-10-CM | POA: Diagnosis not present

## 2017-04-19 DIAGNOSIS — Z0000 Encounter for general adult medical examination without abnormal findings: Secondary | ICD-10-CM | POA: Diagnosis not present

## 2017-04-19 DIAGNOSIS — I10 Essential (primary) hypertension: Secondary | ICD-10-CM | POA: Diagnosis not present

## 2017-04-19 DIAGNOSIS — I5032 Chronic diastolic (congestive) heart failure: Secondary | ICD-10-CM | POA: Diagnosis not present

## 2017-04-19 DIAGNOSIS — N183 Chronic kidney disease, stage 3 (moderate): Secondary | ICD-10-CM | POA: Diagnosis not present

## 2017-04-19 DIAGNOSIS — I129 Hypertensive chronic kidney disease with stage 1 through stage 4 chronic kidney disease, or unspecified chronic kidney disease: Secondary | ICD-10-CM | POA: Diagnosis not present

## 2017-04-23 DIAGNOSIS — H524 Presbyopia: Secondary | ICD-10-CM | POA: Diagnosis not present

## 2017-04-23 DIAGNOSIS — H40053 Ocular hypertension, bilateral: Secondary | ICD-10-CM | POA: Diagnosis not present

## 2017-04-23 DIAGNOSIS — Z961 Presence of intraocular lens: Secondary | ICD-10-CM | POA: Diagnosis not present

## 2017-04-23 DIAGNOSIS — H0100A Unspecified blepharitis right eye, upper and lower eyelids: Secondary | ICD-10-CM | POA: Diagnosis not present

## 2017-04-23 DIAGNOSIS — H0100B Unspecified blepharitis left eye, upper and lower eyelids: Secondary | ICD-10-CM | POA: Diagnosis not present

## 2017-04-23 DIAGNOSIS — H52203 Unspecified astigmatism, bilateral: Secondary | ICD-10-CM | POA: Diagnosis not present

## 2017-04-26 DIAGNOSIS — M069 Rheumatoid arthritis, unspecified: Secondary | ICD-10-CM | POA: Diagnosis not present

## 2017-04-26 DIAGNOSIS — I4891 Unspecified atrial fibrillation: Secondary | ICD-10-CM | POA: Diagnosis not present

## 2017-04-26 DIAGNOSIS — Z720 Tobacco use: Secondary | ICD-10-CM | POA: Diagnosis not present

## 2017-04-26 DIAGNOSIS — J449 Chronic obstructive pulmonary disease, unspecified: Secondary | ICD-10-CM | POA: Diagnosis not present

## 2017-04-26 DIAGNOSIS — R42 Dizziness and giddiness: Secondary | ICD-10-CM | POA: Diagnosis not present

## 2017-04-26 DIAGNOSIS — I10 Essential (primary) hypertension: Secondary | ICD-10-CM | POA: Diagnosis not present

## 2017-05-09 DIAGNOSIS — M0589 Other rheumatoid arthritis with rheumatoid factor of multiple sites: Secondary | ICD-10-CM | POA: Diagnosis not present

## 2017-05-14 ENCOUNTER — Encounter: Payer: Self-pay | Admitting: Physical Therapy

## 2017-05-14 ENCOUNTER — Other Ambulatory Visit: Payer: Self-pay

## 2017-05-14 ENCOUNTER — Ambulatory Visit: Payer: Medicare Other | Attending: Internal Medicine | Admitting: Physical Therapy

## 2017-05-14 DIAGNOSIS — R2681 Unsteadiness on feet: Secondary | ICD-10-CM | POA: Insufficient documentation

## 2017-05-14 DIAGNOSIS — R2689 Other abnormalities of gait and mobility: Secondary | ICD-10-CM | POA: Insufficient documentation

## 2017-05-14 DIAGNOSIS — R42 Dizziness and giddiness: Secondary | ICD-10-CM

## 2017-05-14 NOTE — Therapy (Signed)
[facility] [address]  [place], [place], [address] Phone: [phone]   Fax:  [phone]  Physical Therapy Evaluation  Patient Details  Name: Cory Gonzalez MRN: [id number] Date of Birth: 20-Jul-1929 Referring Provider: Dr. Merrilee Seashore   Encounter Date: 05/14/2017  PT End of Session - 05/14/17 1057    Visit Number  1    Number of Visits  16    Date for PT Re-Evaluation  07/09/17    Authorization Type  Medicare    PT Start Time  0925    PT Stop Time  1013    PT Time Calculation (min)  48 min    Activity Tolerance  Patient tolerated treatment well    Behavior During Therapy  [facility] for tasks assessed/performed       Past Medical History:  Diagnosis Date  . Chronic diastolic heart failure ([place])   . Hypertension   . Paroxysmal atrial fibrillation (HCC)   . Rheumatoid arthritis [facility])     Past Surgical History:  Procedure Laterality Date  . CATARACT EXTRACTION    . PROSTATE SURGERY      There were no vitals filed for this visit.   Subjective Assessment - 05/14/17 0926    Subjective  Patient rpeorting difficulty walking, balance, and dizziness. In the past 6 months has had several falls. May be dizzy when falling, otheriwse just looses balance. Tends to retropulse. Uses SPC intermitently - has good and bad days. Reports dizziness with positional changes - supine, sitting, standings. Denies N&T into legs/feet. Reports a history of back pain - sees a chiro for this.     Pertinent History  HTN, RA    Patient Stated Goals  improve balance    Currently in Pain?  No/denies         [facility] PT Assessment - 05/14/17 0932      Assessment   Medical Diagnosis  balance, dizziness    Referring Provider  Dr. Merrilee Seashore    Next MD Visit  prn    Prior Therapy  no      Precautions   Precautions  None      Restrictions   Weight Bearing Restrictions  No      Balance Screen   Has the patient fallen in  the past 6 months  Yes    How many times?  -- several    Has the patient had a decrease in activity level because of a fear of falling?   No    Is the patient reluctant to leave their home because of a fear of falling?   No      Home Film/video editor residence    Living Arrangements  Spouse/significant other    Home Access  Stairs to enter    Entrance Stairs-Number of Steps  1    [place]  One level    [place] - single point      Prior Function   Level of Stem  Retired    Insurance underwriter and farmer    Leisure  gardening      Cognition   Overall Cognitive Status  Within Functional Limits for tasks assessed      Sensation   Light Touch  Appears Intact      Coordination   Gross Motor Movements are Fluid and Coordinated  Yes  ROM / Strength   AROM / PROM / Strength  AROM;Strength      AROM   Overall AROM   Within functional limits for tasks performed    Overall AROM Comments  B LE      Strength   Strength Assessment Site  Hip;Knee    Right/Left Hip  Right;Left    Right Hip Flexion  4/5    Left Hip Flexion  4/5    Right/Left Knee  Right;Left    Right Knee Flexion  4+/5    Right Knee Extension  4+/5    Left Knee Flexion  4+/5    Left Knee Extension  4+/5      Transfers   Five time sit to stand comments   16.93      Ambulation/Gait   Ambulation/Gait  Yes    Ambulation/Gait Assistance  6: Modified independent (Device/Increase time)    Ambulation Distance (Feet)  100 Feet    Assistive device  Straight cane    Gait Pattern  Step-through pattern;Trunk flexed;Wide base of support    Ambulation Surface  Level;Indoor      Balance   Balance Assessed  Yes      Standardized Balance Assessment   Standardized Balance Assessment  Berg Balance Test;Timed Up and Go Test;Five Times Sit to Stand;Dynamic Gait Index      Berg Balance Test   Sit to Stand  Able to stand   independently using hands    Standing Unsupported  Able to stand 2 minutes with supervision    Sitting with Back Unsupported but Feet Supported on Floor or Stool  Able to sit safely and securely 2 minutes    Stand to Sit  Controls descent by using hands    Transfers  Able to transfer safely, definite need of hands    Standing Unsupported with Eyes Closed  Able to stand 10 seconds with supervision    Standing Ubsupported with Feet Together  Needs help to attain position but able to stand for 30 seconds with feet together    From Standing, Reach Forward with Outstretched Arm  Can reach forward >5 cm safely (2")    From Standing Position, Pick up Object from Floor  Able to pick up shoe, needs supervision    From Standing Position, Turn to Look Behind Over each Shoulder  Looks behind from both sides and weight shifts well    Turn 360 Degrees  Able to turn 360 degrees safely but slowly    Standing Unsupported, Alternately Place Feet on Step/Stool  Able to complete >2 steps/needs minimal assist    Standing Unsupported, One Foot in Front  Needs help to step but can hold 15 seconds    Standing on One Leg  Tries to lift leg/unable to hold 3 seconds but remains standing independently    Total Score  34      Dynamic Gait Index   Level Surface  Normal    Change in Gait Speed  Moderate Impairment    Gait with Horizontal Head Turns  Mild Impairment    Gait with Vertical Head Turns  Mild Impairment    Gait and Pivot Turn  Mild Impairment    Step Over Obstacle  Mild Impairment    Step Around Obstacles  Mild Impairment    Steps  Moderate Impairment    Total Score  15      Timed Up and Go Test   Normal TUG (seconds)  14.25    Manual TUG (  seconds)  13.53                Objective measurements completed on examination: See above findings.              PT Education - 05/14/17 1056    Education provided  Yes    Education Details  exam findings, POC    Person(s) Educated  Patient     Methods  Explanation    Comprehension  Verbalized understanding;Returned demonstration       PT Short Term Goals - 05/14/17 1102      PT SHORT TERM GOAL #1   Title  patient to be independent with initial HEP    Status  New    Target Date  06/04/17      PT SHORT TERM GOAL #2   Title  patient to demonstrate gait speed of >/= 2.62 ft/sec without evidence of instability    Status  New    Target Date  06/04/17        PT Long Term Goals - 05/14/17 1103      PT LONG TERM GOAL #1   Title  patient to be independent with advanced HEP    Status  New    Target Date  07/09/17      PT LONG TERM GOAL #2   Title  patient to improve Berg Balance to >/= 46/56 for reduced fall risk    Status  New    Target Date  07/09/17      PT LONG TERM GOAL #3   Title  patient to improve DGI to >/=19 for reduced fall risk    Status  New    Target Date  07/09/17      PT LONG TERM GOAL #4   Title  patient to report no falls for greater than 1 month    Status  New    Target Date  07/09/17             Plan - 05/14/17 1105    Clinical Impression Statement  Cory Gonzalez is a pleasant 84 y/o male presenting to [facility] today regarding balance deficits with a history of several falls within the past 6 months. Patient and wife report dizziness associated with falls - unsure of true etiology of dizziness currently - will plan to continue to assess this. Patient today scoring in high fall risk with Berg Balance and DGI today. Patient and wife educated on POC and need for skilled PT intervention. Patient to benefit from skilled PT intervetnion to address fall risk and promote reduced falls.     Clinical Presentation  Stable    Clinical Decision Making  Low    Rehab Potential  Good    PT Frequency  2x / week    PT Duration  8 weeks    PT Treatment/Interventions  ADLs/Self Care Home Management;Cryotherapy;Electrical Stimulation;Moist Heat;Therapeutic exercise;Therapeutic activities;Functional mobility  training;Stair training;Gait training;DME Instruction;Balance training;Neuromuscular re-education;Patient/family education;Manual techniques;Taping    Consulted and Agree with Plan of Care  Patient       Patient will benefit from skilled therapeutic intervention in order to improve the following deficits and impairments:  Decreased balance, Decreased mobility, Dizziness, Decreased safety awareness  Visit Diagnosis: Unsteadiness on feet  Other abnormalities of gait and mobility  Dizziness and giddiness     Problem List There are no active problems to display for this patient.    Lanney Gins, PT, DPT 05/14/17 11:12 AM   [facility]  [facility] [address]  [place], [place], [address] Phone: [phone]   Fax:  [phone]  Name: Cory Gonzalez MRN: [id number] Date of Birth: 26-May-1929

## 2017-05-17 ENCOUNTER — Encounter: Payer: Self-pay | Admitting: Physical Therapy

## 2017-05-17 ENCOUNTER — Ambulatory Visit: Payer: Medicare Other | Admitting: Physical Therapy

## 2017-05-17 DIAGNOSIS — R2681 Unsteadiness on feet: Secondary | ICD-10-CM | POA: Diagnosis not present

## 2017-05-17 DIAGNOSIS — R42 Dizziness and giddiness: Secondary | ICD-10-CM

## 2017-05-17 DIAGNOSIS — R2689 Other abnormalities of gait and mobility: Secondary | ICD-10-CM

## 2017-05-17 NOTE — Therapy (Signed)
[facility] [address]  [place], [place], [address] Phone: [phone]   Fax:  [phone]  Physical Therapy Treatment  Patient Details  Name: Cory Gonzalez MRN: [id number] Date of Birth: 11-22-1929 Referring Provider: Dr. Merrilee Seashore   Encounter Date: 05/17/2017  PT End of Session - 05/17/17 0840    Visit Number  2    Number of Visits  16    Date for PT Re-Evaluation  07/09/17    Authorization Type  Medicare    PT Start Time  0753    PT Stop Time  0835    PT Time Calculation (min)  42 min    Activity Tolerance  Patient tolerated treatment well    Behavior During Therapy  [facility] for tasks assessed/performed       Past Medical History:  Diagnosis Date  . Chronic diastolic heart failure ([place])   . Hypertension   . Paroxysmal atrial fibrillation (HCC)   . Rheumatoid arthritis [facility])     Past Surgical History:  Procedure Laterality Date  . CATARACT EXTRACTION    . PROSTATE SURGERY      There were no vitals filed for this visit.  Subjective Assessment - 05/17/17 0839    Subjective  doing well - able to garden yesterdya with no falls    Pertinent History  HTN, RA    Patient Stated Goals  improve balance    Currently in Pain?  No/denies                            Balance Exercises - 05/17/17 0758      OTAGO PROGRAM   Head Movements  Standing;5 reps    Neck Movements  Standing;5 reps    Back Extension  Standing;5 reps    Trunk Movements  Standing;5 reps    Ankle Movements  Sitting;10 reps    Knee Extensor  10 reps    Knee Flexor  10 reps    Hip ABductor  10 reps    Ankle Plantorflexors  20 reps, support    Ankle Dorsiflexors  20 reps, support    Knee Bends  10 reps, support    Backwards Walking  Support    Walking and Turning Around  No assistive device    Sideways Walking  Assistive device    Tandem Stance  10 seconds, support    Tandem Walk  Support    One Leg Stand   10 seconds, support    Heel Walking  Support    Toe Walk  Support    Heel Toe Walking Backward  No support support    Sit to Stand  10 reps, no support    Stair Walking  -- deferred          PT Short Term Goals - 05/17/17 0840      PT SHORT TERM GOAL #1   Title  patient to be independent with initial HEP    Status  On-going      PT SHORT TERM GOAL #2   Title  patient to demonstrate gait speed of >/= 2.62 ft/sec without evidence of instability    Status  On-going        PT Long Term Goals - 05/17/17 0840      PT LONG TERM GOAL #1   Title  patient to be independent with advanced HEP    Status  On-going  PT LONG TERM GOAL #2   Title  patient to improve Berg Balance to >/= 46/56 for reduced fall risk    Status  On-going      PT LONG TERM GOAL #3   Title  patient to improve DGI to >/=19 for reduced fall risk    Status  On-going      PT LONG TERM GOAL #4   Title  patient to report no falls for greater than 1 month    Status  On-going            Plan - 05/17/17 0840    Clinical Impression Statement  Patient doing well today - feels today is a "good day". Without SPC today with noted slight scissoring gait pattern increasing his fall risk. Instructed on home use of OTAGO program today without issue. Does require counter/UE support for near all activities with re-inforcement to use support at home for good safety with good understanding. Will continue to progress towards goals.      PT Treatment/Interventions  ADLs/Self Care Home Management;Cryotherapy;Electrical Stimulation;Moist Heat;Therapeutic exercise;Therapeutic activities;Functional mobility training;Stair training;Gait training;DME Instruction;Balance training;Neuromuscular re-education;Patient/family education;Manual techniques;Taping    Consulted and Agree with Plan of Care  Patient       Patient will benefit from skilled therapeutic intervention in order to improve the following deficits and impairments:   Decreased balance, Decreased mobility, Dizziness, Decreased safety awareness  Visit Diagnosis: Unsteadiness on feet  Other abnormalities of gait and mobility  Dizziness and giddiness     Problem List There are no active problems to display for this patient.    Lanney Gins, PT, DPT 05/17/17 8:42 AM   [facility] [address]  [place], [place], [address] Phone: [phone]   Fax:  [phone]  Name: Daquavion Catala MRN: [id number] Date of Birth: 09/17/1929

## 2017-05-21 ENCOUNTER — Encounter: Payer: Self-pay | Admitting: Physical Therapy

## 2017-05-21 ENCOUNTER — Ambulatory Visit: Payer: Medicare Other | Admitting: Physical Therapy

## 2017-05-21 DIAGNOSIS — R2689 Other abnormalities of gait and mobility: Secondary | ICD-10-CM

## 2017-05-21 DIAGNOSIS — R2681 Unsteadiness on feet: Secondary | ICD-10-CM

## 2017-05-21 DIAGNOSIS — R42 Dizziness and giddiness: Secondary | ICD-10-CM

## 2017-05-21 NOTE — Therapy (Signed)
[facility] [address]  [place], [place], [address] Phone: [phone]   Fax:  [phone]  Physical Therapy Treatment  Patient Details  Name: Cory Gonzalez MRN: [id number] Date of Birth: 1929/05/18 Referring Provider: Dr. Merrilee Seashore   Encounter Date: 05/21/2017  PT End of Session - 05/21/17 0840    Visit Number  3    Number of Visits  16    Date for PT Re-Evaluation  07/09/17    Authorization Type  Medicare    PT Start Time  0838    PT Stop Time  0920    PT Time Calculation (min)  42 min    Activity Tolerance  Patient tolerated treatment well    Behavior During Therapy  [facility] for tasks assessed/performed       Past Medical History:  Diagnosis Date  . Chronic diastolic heart failure ([place])   . Hypertension   . Paroxysmal atrial fibrillation (HCC)   . Rheumatoid arthritis [facility])     Past Surgical History:  Procedure Laterality Date  . CATARACT EXTRACTION    . PROSTATE SURGERY      There were no vitals filed for this visit.  Subjective Assessment - 05/21/17 0839    Subjective  doing well - feeling some dizzy this moring - worked hard in the garden this weekend    Pertinent History  HTN, RA    Patient Stated Goals  improve balance    Currently in Pain?  No/denies                       [facility] Adult PT Treatment/Exercise - 05/21/17 0841      Exercises   Exercises  Knee/Hip      Knee/Hip Exercises: Aerobic   Nustep  L5 x 6 min          Balance Exercises - 05/21/17 0902      Balance Exercises: Standing   Standing Eyes Opened  Narrow base of support (BOS);Foam/compliant surface feet together and tandem stance - tossing bean bags    Standing Eyes Closed  Narrow base of support (BOS);3 reps;20 secs moderate sway    Step Ups  Forward;6 inch;UE support 1 R LE x 12, L LE x 12    Tandem Gait  Forward;Upper extremity support x 40 feet    Cone Rotation  Solid surface;Upper  extremity assist 2 double taps, knockovers    Sit to Stand Time  sit to stand - sitting and standing on foam x 12 reps - no UE support          PT Short Term Goals - 05/17/17 0840      PT SHORT TERM GOAL #1   Title  patient to be independent with initial HEP    Status  On-going      PT SHORT TERM GOAL #2   Title  patient to demonstrate gait speed of >/= 2.62 ft/sec without evidence of instability    Status  On-going        PT Long Term Goals - 05/17/17 0840      PT LONG TERM GOAL #1   Title  patient to be independent with advanced HEP    Status  On-going      PT LONG TERM GOAL #2   Title  patient to improve Berg Balance to >/= 46/56 for reduced fall risk    Status  On-going      PT LONG  TERM GOAL #3   Title  patient to improve DGI to >/=19 for reduced fall risk    Status  On-going      PT LONG TERM GOAL #4   Title  patient to report no falls for greater than 1 month    Status  On-going            Plan - 05/21/17 0841    Clinical Impression Statement  Ray doing well - feels like today is an off-balance day - worked long hours on knees yesterday on his knees. Patient doing well with all balance work today - does require CGA to SUP for general safety as patient with moderate sway with narrow BOS, balance with EC, as well as on foam/compliant surfaces. Will continue to progress balance at upcoming visits.     PT Treatment/Interventions  ADLs/Self Care Home Management;Cryotherapy;Electrical Stimulation;Moist Heat;Therapeutic exercise;Therapeutic activities;Functional mobility training;Stair training;Gait training;DME Instruction;Balance training;Neuromuscular re-education;Patient/family education;Manual techniques;Taping    Consulted and Agree with Plan of Care  Patient       Patient will benefit from skilled therapeutic intervention in order to improve the following deficits and impairments:  Decreased balance, Decreased mobility, Dizziness, Decreased safety  awareness  Visit Diagnosis: Unsteadiness on feet  Other abnormalities of gait and mobility  Dizziness and giddiness     Problem List There are no active problems to display for this patient.    Lanney Gins, PT, DPT 05/21/17 9:38 AM   [facility] [address]  [place], [place], [address] Phone: [phone]   Fax:  [phone]  Name: Chayne Baumgart MRN: [id number] Date of Birth: 1929-03-18

## 2017-05-24 ENCOUNTER — Ambulatory Visit: Payer: Medicare Other | Admitting: Physical Therapy

## 2017-05-24 ENCOUNTER — Encounter: Payer: Self-pay | Admitting: Physical Therapy

## 2017-05-24 DIAGNOSIS — R2681 Unsteadiness on feet: Secondary | ICD-10-CM

## 2017-05-24 DIAGNOSIS — R42 Dizziness and giddiness: Secondary | ICD-10-CM | POA: Diagnosis not present

## 2017-05-24 DIAGNOSIS — R2689 Other abnormalities of gait and mobility: Secondary | ICD-10-CM | POA: Diagnosis not present

## 2017-05-24 NOTE — Therapy (Signed)
[facility] [address]  [place], [place], [address] Phone: [phone]   Fax:  [phone]  Physical Therapy Treatment  Patient Details  Name: Cory Gonzalez MRN: [id number] Date of Birth: [birth year] Referring Provider: Dr. Merrilee Seashore   Encounter Date: 05/24/2017  PT End of Session - 05/24/17 0930    Visit Number  4    Number of Visits  16    Date for PT Re-Evaluation  07/09/17    Authorization Type  Medicare    PT Start Time  0928    PT Stop Time  1012    PT Time Calculation (min)  44 min    Activity Tolerance  Patient tolerated treatment well    Behavior During Therapy  [facility] for tasks assessed/performed       Past Medical History:  Diagnosis Date  . Chronic diastolic heart failure ([place])   . Hypertension   . Paroxysmal atrial fibrillation (HCC)   . Rheumatoid arthritis [facility]     Past Surgical History:  Procedure Laterality Date  . CATARACT EXTRACTION    . PROSTATE SURGERY      There were no vitals filed for this visit.  Subjective Assessment - 05/24/17 0929    Subjective  balance was good yesterday - has been doing HEP    Pertinent History  HTN, RA    Patient Stated Goals  improve balance    Currently in Pain?  No/denies                       [facility] Adult PT Treatment/Exercise - 05/24/17 0931      Knee/Hip Exercises: Aerobic   Nustep  L5 x 6 min          Balance Exercises - 05/24/17 1052      Balance Exercises: Standing   Standing Eyes Opened  Narrow base of support (BOS);Head turns;Foam/compliant surface narrow BOS + tandem - horizontal/vertical head turns    Step Ups  -- 8" - 1 step firm, 1 + foam    Balance Beam  fwd/bwd tandem - UE support; side stepping - UE support    Other Standing Exercises  standing ball kicks x 2-3 min    Overall Comments  -- standing perturbations from PT - 1 LOB          PT Short Term Goals - 05/17/17 0840      PT SHORT TERM  GOAL #1   Title  patient to be independent with initial HEP    Status  On-going      PT SHORT TERM GOAL #2   Title  patient to demonstrate gait speed of >/= 2.62 ft/sec without evidence of instability    Status  On-going        PT Long Term Goals - 05/17/17 0840      PT LONG TERM GOAL #1   Title  patient to be independent with advanced HEP    Status  On-going      PT LONG TERM GOAL #2   Title  patient to improve Berg Balance to >/= 46/56 for reduced fall risk    Status  On-going      PT LONG TERM GOAL #3   Title  patient to improve DGI to >/=19 for reduced fall risk    Status  On-going      PT LONG TERM GOAL #4   Title  patient to report no falls for greater  than 1 month    Status  On-going            Plan - 05/24/17 0930    Clinical Impression Statement  Cory Gonzalez doing well today - reports his balance is feeling better. Tolerable to all balance work today with narrow BOS, tandem, as well as external perturbations from PT. Does continue to require CGA to SUP for general safety. Making good progress towards goals.     PT Treatment/Interventions  ADLs/Self Care Home Management;Cryotherapy;Electrical Stimulation;Moist Heat;Therapeutic exercise;Therapeutic activities;Functional mobility training;Stair training;Gait training;DME Instruction;Balance training;Neuromuscular re-education;Patient/family education;Manual techniques;Taping    Consulted and Agree with Plan of Care  Patient       Patient will benefit from skilled therapeutic intervention in order to improve the following deficits and impairments:  Decreased balance, Decreased mobility, Dizziness, Decreased safety awareness  Visit Diagnosis: Unsteadiness on feet  Other abnormalities of gait and mobility  Dizziness and giddiness     Problem List There are no active problems to display for this patient.   Lanney Gins, PT, DPT 05/24/17 10:57 AM   [facility] [address]  [place], [place], [address] Phone: [phone]   Fax:  [phone]  Name: Cory Gonzalez MRN: [id number] Date of Birth: November 22, 1929

## 2017-05-28 ENCOUNTER — Ambulatory Visit: Payer: Medicare Other | Admitting: Physical Therapy

## 2017-05-28 ENCOUNTER — Encounter: Payer: Self-pay | Admitting: Physical Therapy

## 2017-05-28 DIAGNOSIS — R2681 Unsteadiness on feet: Secondary | ICD-10-CM

## 2017-05-28 DIAGNOSIS — R42 Dizziness and giddiness: Secondary | ICD-10-CM

## 2017-05-28 DIAGNOSIS — R2689 Other abnormalities of gait and mobility: Secondary | ICD-10-CM | POA: Diagnosis not present

## 2017-05-28 NOTE — Therapy (Signed)
[facility] [address]  [place], [place], [address] Phone: [phone]   Fax:  [phone]  Physical Therapy Treatment  Patient Details  Name: Cory Gonzalez MRN: [id number] Date of Birth: 1929-07-10 Referring Provider: Dr. Merrilee Seashore   Encounter Date: 05/28/2017  PT End of Session - 05/28/17 0850    Visit Number  5    Number of Visits  16    Date for PT Re-Evaluation  07/09/17    Authorization Type  Medicare    PT Start Time  0848    PT Stop Time  0928    PT Time Calculation (min)  40 min    Activity Tolerance  Patient tolerated treatment well    Behavior During Therapy  [facility] for tasks assessed/performed       Past Medical History:  Diagnosis Date  . Chronic diastolic heart failure ([place])   . Hypertension   . Paroxysmal atrial fibrillation (HCC)   . Rheumatoid arthritis [facility])     Past Surgical History:  Procedure Laterality Date  . CATARACT EXTRACTION    . PROSTATE SURGERY      There were no vitals filed for this visit.  Subjective Assessment - 05/28/17 0849    Subjective  doing well - thinks his balance is getting better    Pertinent History  HTN, RA    Patient Stated Goals  improve balance    Currently in Pain?  No/denies                       [facility] Adult PT Treatment/Exercise - 05/28/17 0852      Ambulation/Gait   Stairs  Yes    Stairs Assistance  6: Modified independent (Device/Increase time);5: Supervision    Stair Management Technique  One rail Right;Alternating pattern    Number of Stairs  13 2 sets    Height of Stairs  8      Neuro Re-ed    Neuro Re-ed Details   gait on grassy surfaces x 500 feet - no LOB      Knee/Hip Exercises: Aerobic   Nustep  L5 x 6 min      Knee/Hip Exercises: Machines for Strengthening   Cybex Leg Press  B LE - 25# x 20      Knee/Hip Exercises: Standing   Heel Raises  Both;15 reps negative          Balance Exercises -  05/28/17 0924      Balance Exercises: Standing   Standing Eyes Opened  Narrow base of support (BOS);Foam/compliant surface vertical and horizontal head turns     Tandem Stance  Eyes open;Foam/compliant surface vertical and horziontal head turns    Step Ups  -- curb navigation x 6 reps    Other Standing Exercises  B side stepping - yellow tband x 20 feet; fwd/bwd resisted walking - yellow tband 2 x 20 feet          PT Short Term Goals - 05/17/17 0840      PT SHORT TERM GOAL #1   Title  patient to be independent with initial HEP    Status  On-going      PT SHORT TERM GOAL #2   Title  patient to demonstrate gait speed of >/= 2.62 ft/sec without evidence of instability    Status  On-going        PT Long Term Goals - 05/17/17 0840  PT LONG TERM GOAL #1   Title  patient to be independent with advanced HEP    Status  On-going      PT LONG TERM GOAL #2   Title  patient to improve Berg Balance to >/= 46/56 for reduced fall risk    Status  On-going      PT LONG TERM GOAL #3   Title  patient to improve DGI to >/=19 for reduced fall risk    Status  On-going      PT LONG TERM GOAL #4   Title  patient to report no falls for greater than 1 month    Status  On-going            Plan - 05/28/17 0851    Clinical Impression Statement  Patient doing well today with all strengthening and balance work - able to [employer] on grassy surface without LOB as well as up down curb - some instability noted with turning but doe not loose balance or hands on assist for balance - simply SUP to CGA. Patient making good progess towards goals.     PT Treatment/Interventions  ADLs/Self Care Home Management;Cryotherapy;Electrical Stimulation;Moist Heat;Therapeutic exercise;Therapeutic activities;Functional mobility training;Stair training;Gait training;DME Instruction;Balance training;Neuromuscular re-education;Patient/family education;Manual techniques;Taping    Consulted and Agree with Plan of  Care  Patient       Patient will benefit from skilled therapeutic intervention in order to improve the following deficits and impairments:  Decreased balance, Decreased mobility, Dizziness, Decreased safety awareness  Visit Diagnosis: Unsteadiness on feet  Other abnormalities of gait and mobility  Dizziness and giddiness     Problem List There are no active problems to display for this patient.   Lanney Gins, PT, DPT 05/28/17 12:16 PM   [facility] [address]  [place], [place], [address] Phone: [phone]   Fax:  [phone]  Name: Cory Gonzalez MRN: [id number] Date of Birth: [birth year]

## 2017-05-31 ENCOUNTER — Encounter: Payer: Self-pay | Admitting: Physical Therapy

## 2017-05-31 ENCOUNTER — Emergency Department (HOSPITAL_BASED_OUTPATIENT_CLINIC_OR_DEPARTMENT_OTHER): Payer: Medicare Other

## 2017-05-31 ENCOUNTER — Ambulatory Visit: Payer: Medicare Other | Attending: Internal Medicine | Admitting: Physical Therapy

## 2017-05-31 ENCOUNTER — Emergency Department (HOSPITAL_BASED_OUTPATIENT_CLINIC_OR_DEPARTMENT_OTHER)
Admission: EM | Admit: 2017-05-31 | Discharge: 2017-05-31 | Disposition: A | Discharge status: Home / Self Care | Payer: Medicare Other | Attending: Emergency Medicine | Admitting: Emergency Medicine

## 2017-05-31 ENCOUNTER — Other Ambulatory Visit: Payer: Self-pay

## 2017-05-31 ENCOUNTER — Encounter (HOSPITAL_BASED_OUTPATIENT_CLINIC_OR_DEPARTMENT_OTHER): Payer: Self-pay | Admitting: Emergency Medicine

## 2017-05-31 VITALS — BP 118/68

## 2017-05-31 DIAGNOSIS — Y9389 Activity, other specified: Secondary | ICD-10-CM | POA: Diagnosis not present

## 2017-05-31 DIAGNOSIS — W228XXA Striking against or struck by other objects, initial encounter: Secondary | ICD-10-CM | POA: Diagnosis not present

## 2017-05-31 DIAGNOSIS — F17290 Nicotine dependence, other tobacco product, uncomplicated: Secondary | ICD-10-CM | POA: Insufficient documentation

## 2017-05-31 DIAGNOSIS — R2681 Unsteadiness on feet: Secondary | ICD-10-CM | POA: Diagnosis not present

## 2017-05-31 DIAGNOSIS — I110 Hypertensive heart disease with heart failure: Secondary | ICD-10-CM | POA: Diagnosis not present

## 2017-05-31 DIAGNOSIS — I5032 Chronic diastolic (congestive) heart failure: Secondary | ICD-10-CM | POA: Diagnosis not present

## 2017-05-31 DIAGNOSIS — R2689 Other abnormalities of gait and mobility: Secondary | ICD-10-CM

## 2017-05-31 DIAGNOSIS — Y999 Unspecified external cause status: Secondary | ICD-10-CM | POA: Insufficient documentation

## 2017-05-31 DIAGNOSIS — L03113 Cellulitis of right upper limb: Secondary | ICD-10-CM | POA: Insufficient documentation

## 2017-05-31 DIAGNOSIS — Y9289 Other specified places as the place of occurrence of the external cause: Secondary | ICD-10-CM | POA: Insufficient documentation

## 2017-05-31 DIAGNOSIS — R42 Dizziness and giddiness: Secondary | ICD-10-CM | POA: Diagnosis not present

## 2017-05-31 DIAGNOSIS — M79641 Pain in right hand: Secondary | ICD-10-CM | POA: Diagnosis not present

## 2017-05-31 MED ORDER — CEPHALEXIN 500 MG PO CAPS: 500.0000 mg | ORAL_CAPSULE | Freq: Three times a day (TID) | ORAL | 0 refills | Status: AC

## 2017-05-31 MED FILL — CEPHALEXIN 500 MG CAPSULE: 500 | 7 days supply | Qty: 21 | Fill #0

## 2017-05-31 NOTE — Therapy (Signed)
[facility] [address]  [place], [place], [address] Phone: [phone]   Fax:  [phone]  Physical Therapy Treatment  Patient Details  Name: Cory Gonzalez MRN: [id number] Date of Birth: Jun 02, 1929 Referring Provider: Dr. Merrilee Seashore   Encounter Date: 05/31/2017  PT End of Session - 05/31/17 0946    Visit Number  6    Number of Visits  16    Date for PT Re-Evaluation  07/09/17    Authorization Type  Medicare    PT Start Time  0845    PT Stop Time  0928    PT Time Calculation (min)  43 min    Equipment Utilized During Treatment  Gait belt    Activity Tolerance  Patient tolerated treatment well    Behavior During Therapy  [facility] for tasks assessed/performed       Past Medical History:  Diagnosis Date  . Chronic diastolic heart failure ([place])   . Hypertension   . Paroxysmal atrial fibrillation (HCC)   . Rheumatoid arthritis [facility])     Past Surgical History:  Procedure Laterality Date  . CATARACT EXTRACTION    . PROSTATE SURGERY      Vitals:   05/31/17 0932  BP: 118/68    Subjective Assessment - 05/31/17 0848    Subjective  Patient reports that he fell the day before yesterday and "I have not been good." Occured when he felt "drunk/dizzy" and fell; suffered a laceration on L forearm. Reports he hurt his R wrist pulling a water hose.    Currently in Pain?  Yes    Pain Location  Wrist    Pain Orientation  Right    Pain Type  Acute pain                       [facility] Adult PT Treatment/Exercise - 05/31/17 0001      High Level Balance   High Level Balance Activities  Marching forwards    High Level Balance Comments  6 min Walking forward while catching blue ball; min A to correct perturbations      Exercises   Exercises  Knee/Hip      Knee/Hip Exercises: Aerobic   Nustep  L5 x 7 min      Knee/Hip Exercises: Standing   Hip Extension  AROM;15 reps;Right;Left;Limitations    Extension Limitations  Verbal and tactile cues to straighten knee and keep upright trunk; 2 finger UE support on counter    Other Standing Knee Exercises  Step on and off dynadisc; each LE 1 min Min guard/assist intermittently    Other Standing Knee Exercises  Mini wall squats; 15x Verbal/tactile cues to keep spine on wall and avoid valgus R      Knee/Hip Exercises: Seated   Sit to Sand  5 reps;2 sets;with UE support Cues to engage glutes and upright; on airex; min guard                PT Short Term Goals - 05/17/17 0840      PT SHORT TERM GOAL #1   Title  patient to be independent with initial HEP    Status  On-going      PT SHORT TERM GOAL #2   Title  patient to demonstrate gait speed of >/= 2.62 ft/sec without evidence of instability    Status  On-going        PT Long Term Goals - 05/17/17 0840  PT LONG TERM GOAL #1   Title  patient to be independent with advanced HEP    Status  On-going      PT LONG TERM GOAL #2   Title  patient to improve Berg Balance to >/= 46/56 for reduced fall risk    Status  On-going      PT LONG TERM GOAL #3   Title  patient to improve DGI to >/=19 for reduced fall risk    Status  On-going      PT LONG TERM GOAL #4   Title  patient to report no falls for greater than 1 month    Status  On-going            Plan - 05/31/17 0947    Clinical Impression Statement  Patient arrived to session with wife today and without cane. Reports that he had a fall a couple days ago and sustained a L forearm laceration due to dizziness. Also reports that he hurt his wrist pulling on a garden hose- R hand appears tight and edematous and wrist PROM is restricted  and painful in flexion/extension. Patient with slowness and difficulty walking when arriving to clinic; wife reports this is not a good week for him. Took patient's BP- 118/82mmHg. Patient tolerated functional LE ther-ex with verbal/tactile cues to correct form. Tolerated balance challenges  with min A to recover from perturbations. Due to concern about R wrist swelling, escorted patient to ED downstairs.    PT Treatment/Interventions  ADLs/Self Care Home Management;Cryotherapy;Electrical Stimulation;Moist Heat;Therapeutic exercise;Therapeutic activities;Functional mobility training;Stair training;Gait training;DME Instruction;Balance training;Neuromuscular re-education;Patient/family education;Manual techniques;Taping    PT Next Visit Plan  Re-assess STS on foam and general balance abilities; progress as tolerated    Consulted and Agree with Plan of Care  Patient       Patient will benefit from skilled therapeutic intervention in order to improve the following deficits and impairments:  Decreased balance, Decreased mobility, Dizziness, Decreased safety awareness  Visit Diagnosis: Unsteadiness on feet  Other abnormalities of gait and mobility  Dizziness and giddiness     Problem List There are no active problems to display for this patient.   Janene Harvey, PT, DPT 05/31/17 10:24 AM    [facility] [place], [place], [address] Phone: [phone]   Fax:  [phone]  Name: Cory Gonzalez MRN: [id number] Date of Birth: 1929-03-08

## 2017-05-31 NOTE — Discharge Instructions (Signed)
You were seen in the ED today with right hand pain and swelling. We are starting an antibiotic for possible skin infection. Call your PCP today and schedule a follow up appointment for next week. Return to the ED with any fever, chills, or worsening hand redness/pain.

## 2017-05-31 NOTE — ED Triage Notes (Signed)
Patient reports injury to right hand after hitting it last Friday.  Swelling and erythema noted to right hand.

## 2017-05-31 NOTE — ED Provider Notes (Signed)
Emergency Department Provider Note   I have reviewed the triage vital signs and the nursing notes.   HISTORY  Chief Complaint Cellulitis   HPI Cory Gonzalez is a 84 y.o. male with PMH of dCHF, HTN, PAF, and RA presents to the emergency department for evaluation of right hand pain and swelling.  The patient injured the hand during minor farm accident with 1 week prior.  Patient was using an ax to try and get a rock out of the tiller when he pulled it backwards and struck his hand on tiller frame. He had pain in the hand with swelling which has continued over the last week.  He reports hand warmness and redness.  He has mild pain with movement of the hand.  No fevers or shaking chills.  Denies any wrist or elbow pain.  No lacerations or abrasions during the injury. Denies numbness/tingling. Patient is right hand dominant.    Past Medical History:  Diagnosis Date  . Chronic diastolic heart failure ([place])   . Hypertension   . Paroxysmal atrial fibrillation (H[facility])   . Rheumatoid arthritis ([place])     There are no active problems to display for this patient.   Past Surgical History:  Procedure Laterality Date  . CATARACT EXTRACTION    . PROSTATE SURGERY      Current Outpatient Rx  . Order #: [id number] Class: Historical Med  . Order #: [id number] Class: Historical Med  . Order #: [id number] Class: Print  . Order #: [id number] Class: Historical Med  . Order #: [id number] Class: Historical Med  . Order #: [id number] Class: Historical Med  . Order #: [id number] Class: Historical Med  . Order #: [id number] Class: Historical Med  . Order #: [id number] Class: Historical Med  . Order #: [id number] Class: Historical Med  . Order #: [id number] Class: Normal  . Order #: [id number] Class: Historical Med    Allergies Patient has no known allergies.  Family History  Problem Relation Age of Onset  . COPD Sister   . Dementia Sister   . Aneurysm Brother     Social History Social History   Tobacco Use  .  Smoking status: Current Every Day Smoker    Years: 50.00    Types: Pipe  . Smokeless tobacco: Former Network engineer Use Topics  . Alcohol use: No    Alcohol/week: 0.0 oz  . Drug use: No    Review of Systems  Constitutional: No fever/chills Eyes: No visual changes. ENT: No sore throat. Cardiovascular: Denies chest pain. Respiratory: Denies shortness of breath. Gastrointestinal: No abdominal pain.  No nausea, no vomiting.  No diarrhea.  No constipation. Genitourinary: Negative for dysuria. Musculoskeletal: Negative for back pain. Positive right hand pain/swelling.  Skin: Right hand redness.  Neurological: Negative for headaches, focal weakness or numbness.  10-point ROS otherwise negative.  ____________________________________________   PHYSICAL EXAM:  VITAL SIGNS: ED Triage Vitals  Enc Vitals Group     BP 05/31/17 0934 134/86     Pulse Rate 05/31/17 0934 94     Resp 05/31/17 0934 20     Temp 05/31/17 0934 97.9 F (36.6 C)     Temp Source 05/31/17 0934 Oral     SpO2 05/31/17 0934 96 %     Weight 05/31/17 0934 140 lb (63.5 kg)     Height 05/31/17 0934 5\' 5"  (1.651 m)     Pain Score 05/31/17 0940 0   Constitutional: Alert and oriented. Well appearing and in no acute distress. Eyes: Conjunctivae are normal.  Head: Atraumatic. Nose: No  congestion/rhinnorhea. Mouth/Throat: Mucous membranes are moist.   Neck: No stridor.  Cardiovascular: Good peripheral circulation.  Respiratory: Normal respiratory effort.  Gastrointestinal:  No distention.  Musculoskeletal: No lower extremity tenderness nor edema. No gross deformities of extremities. Swelling diffusely over the dorsum of the right hand. No focal joint swelling/pain. Mild erythema with warmth. No tracking erythema up the forearm. No wrist tenderness to diffuse palpation including the snuff box.  Neurologic:No gross focal neurologic deficits are appreciated.  Skin:  Skin is warm, dry and intact.    ____________________________________________  RADIOLOGY  Dg Hand Complete Right  Result Date: 05/31/2017 CLINICAL DATA:  Right hand pain EXAM: RIGHT HAND - COMPLETE 3+ VIEW COMPARISON:  None. FINDINGS: Joint space narrowing diffusely throughout the IP joints, most pronounced in the DIP joints. Joint space narrowing in the 1st through 3rd MCP joints. Degenerative changes in the wrist at the 1st carpometacarpal joint. No acute bony abnormality. Specifically, no fracture, subluxation, or dislocation. Diffuse soft tissue swelling. IMPRESSION: Moderate arthritic changes throughout the right hand and wrist, likely osteoarthritis. No acute bony abnormality. Electronically Signed   By: Rolm Baptise M.D.   On: 05/31/2017 10:16    ____________________________________________   PROCEDURES  Procedure(s) performed:   Procedures  None ____________________________________________   INITIAL IMPRESSION / ASSESSMENT AND PLAN / ED COURSE  Pertinent labs & imaging results that were available during my care of the patient were reviewed by me and considered in my medical decision making (see chart for details).  Patient presents to the emergency department for evaluation of right hand swelling after injury 1 week prior.  There is no evidence of laceration or skin breakdown on exam.  Patient has diffuse swelling with mild erythema.  Suspect a mild cellulitis.  Patient is afebrile.  Given symptom onset with injury plan for plain film of the right hand and reassessment after fracture rule out. No evidence of compartment syndrome or developing sepsis. No concern for upper extremity DVT or clinical indication for further imaging.   Plain film of the hand negative for fracture. Plan for Keflex coverage and 48 hour follow up either with PCP or the ED. Discussed reasons to return to the ED sooner.   At this time, I do not feel there is any life-threatening condition present. I have reviewed and discussed all  results (EKG, imaging, lab, urine as appropriate), exam findings with patient. I have reviewed nursing notes and appropriate previous records.  I feel the patient is safe to be discharged home without further emergent workup. Discussed usual and customary return precautions. Patient and family (if present) verbalize understanding and are comfortable with this plan.  Patient will follow-up with their primary care provider. If they do not have a primary care provider, information for follow-up has been provided to them. All questions have been answered.    ____________________________________________  FINAL CLINICAL IMPRESSION(S) / ED DIAGNOSES  Final diagnoses:  Cellulitis of right hand    Note:  This document was prepared using Dragon voice recognition software and may include unintentional dictation errors.  Nanda Quinton, MD Emergency Medicine    Shawan Tosh, [facility], MD 05/31/17 [phone]

## 2017-06-05 DIAGNOSIS — L03113 Cellulitis of right upper limb: Secondary | ICD-10-CM | POA: Diagnosis present

## 2017-06-05 DIAGNOSIS — M1990 Unspecified osteoarthritis, unspecified site: Secondary | ICD-10-CM | POA: Diagnosis not present

## 2017-06-05 DIAGNOSIS — S4991XA Unspecified injury of right shoulder and upper arm, initial encounter: Secondary | ICD-10-CM | POA: Diagnosis not present

## 2017-06-05 DIAGNOSIS — M7989 Other specified soft tissue disorders: Secondary | ICD-10-CM | POA: Diagnosis not present

## 2017-06-05 DIAGNOSIS — E785 Hyperlipidemia, unspecified: Secondary | ICD-10-CM | POA: Diagnosis not present

## 2017-06-05 DIAGNOSIS — I10 Essential (primary) hypertension: Secondary | ICD-10-CM | POA: Diagnosis present

## 2017-06-05 DIAGNOSIS — M19049 Primary osteoarthritis, unspecified hand: Secondary | ICD-10-CM | POA: Diagnosis not present

## 2017-06-05 DIAGNOSIS — F17200 Nicotine dependence, unspecified, uncomplicated: Secondary | ICD-10-CM | POA: Diagnosis present

## 2017-06-05 DIAGNOSIS — S60221A Contusion of right hand, initial encounter: Secondary | ICD-10-CM | POA: Diagnosis not present

## 2017-06-11 ENCOUNTER — Encounter: Payer: Self-pay | Admitting: Physical Therapy

## 2017-06-11 ENCOUNTER — Ambulatory Visit: Payer: Medicare Other | Admitting: Physical Therapy

## 2017-06-11 DIAGNOSIS — R42 Dizziness and giddiness: Secondary | ICD-10-CM

## 2017-06-11 DIAGNOSIS — R2689 Other abnormalities of gait and mobility: Secondary | ICD-10-CM | POA: Diagnosis not present

## 2017-06-11 DIAGNOSIS — R2681 Unsteadiness on feet: Secondary | ICD-10-CM | POA: Diagnosis not present

## 2017-06-11 NOTE — Therapy (Signed)
[facility] [address]  [place], [place], [address] Phone: [phone]   Fax:  [phone]  Physical Therapy Treatment  Patient Details  Name: Cory Gonzalez MRN: [id number] Date of Birth: 03/31/1929 Referring Provider: Dr. Merrilee Seashore   Encounter Date: 06/11/2017  PT End of Session - 06/11/17 1311    Visit Number  7    Number of Visits  16    Date for PT Re-Evaluation  07/09/17    Authorization Type  Medicare    PT Start Time  1308    PT Stop Time  1350    PT Time Calculation (min)  42 min    Activity Tolerance  Patient tolerated treatment well    Behavior During Therapy  [facility] for tasks assessed/performed       Past Medical History:  Diagnosis Date  . Chronic diastolic heart failure ([place])   . Hypertension   . Paroxysmal atrial fibrillation (HCC)   . Rheumatoid arthritis [facility])     Past Surgical History:  Procedure Laterality Date  . CATARACT EXTRACTION    . PROSTATE SURGERY      There were no vitals filed for this visit.  Subjective Assessment - 06/11/17 1310    Subjective  was in hospital 3 days due to R hand swelling - [place] rule out blood clot - negative, xray negative, blood cultures negative.    Pertinent History  HTN, RA    Patient Stated Goals  improve balance    Currently in Pain?  No/denies    Pain Score  0-No pain                       [facility] Adult PT Treatment/Exercise - 06/11/17 1312      Knee/Hip Exercises: Aerobic   Nustep  L5 x 6 min      Knee/Hip Exercises: Seated   Other Seated Knee/Hip Exercises  seated on orange pball - alternating LAQ x 10, alternating hip flexion x 10    Hamstring Curl  Strengthening;Both;20 reps    Hamstring Limitations  green tband    Abduction/Adduction   Strengthening;Both;15 reps    Abd/Adduction Limitations  green tband - abduction          Balance Exercises - 06/11/17 1321      Balance Exercises: Standing   SLS  Eyes  open;5 reps;10 secs;Upper extremity support 2;Solid surface    Standing, One Foot on a Step  Eyes open;8 inch alternating toe taps 2 x 10 reps - 1 set foam surface    Tandem Gait  Forward;2 reps 30 feet    Retro Gait  2 reps 30 feet    Other Standing Exercises  balloon taps - stnaidng on AirEx x 3 min; diagonal ball pass 2 x 10 reps - on AirEx          PT Short Term Goals - 05/17/17 0840      PT SHORT TERM GOAL #1   Title  patient to be independent with initial HEP    Status  On-going      PT SHORT TERM GOAL #2   Title  patient to demonstrate gait speed of >/= 2.62 ft/sec without evidence of instability    Status  On-going        PT Long Term Goals - 05/17/17 0840      PT LONG TERM GOAL #1   Title  patient to be independent with advanced HEP  Status  On-going      PT LONG TERM GOAL #2   Title  patient to improve Berg Balance to >/= 46/56 for reduced fall risk    Status  On-going      PT LONG TERM GOAL #3   Title  patient to improve DGI to >/=19 for reduced fall risk    Status  On-going      PT LONG TERM GOAL #4   Title  patient to report no falls for greater than 1 month    Status  On-going            Plan - 06/11/17 1312    Clinical Impression Statement  Cory Gonzalez reporting 3 night stay at hospital in [facility] due to R hand swelling - no definitive diagnosis as all test were negative. patient today with still some minor swelling but no reports of pain or tenderness at R hand. Patient doing well with all balance activities - noted improvements in balance on compliant surface, but does still require some CGA to SUP for safety. Patient making good progress towards goals.     PT Treatment/Interventions  ADLs/Self Care Home Management;Cryotherapy;Electrical Stimulation;Moist Heat;Therapeutic exercise;Therapeutic activities;Functional mobility training;Stair training;Gait training;DME Instruction;Balance training;Neuromuscular re-education;Patient/family education;Manual  techniques;Taping    Consulted and Agree with Plan of Care  Patient       Patient will benefit from skilled therapeutic intervention in order to improve the following deficits and impairments:  Decreased balance, Decreased mobility, Dizziness, Decreased safety awareness  Visit Diagnosis: Unsteadiness on feet  Other abnormalities of gait and mobility  Dizziness and giddiness     Problem List There are no active problems to display for this patient.    Lanney Gins, PT, DPT 06/11/17 2:00 PM   [facility] [address]  [place], [place], [address] Phone: [phone]   Fax:  [phone]  Name: Cory Gonzalez MRN: [id number] Date of Birth: 07-21-1929

## 2017-06-14 ENCOUNTER — Ambulatory Visit: Payer: Medicare Other | Admitting: Physical Therapy

## 2017-06-14 ENCOUNTER — Encounter: Payer: Self-pay | Admitting: Physical Therapy

## 2017-06-14 DIAGNOSIS — R2689 Other abnormalities of gait and mobility: Secondary | ICD-10-CM | POA: Diagnosis not present

## 2017-06-14 DIAGNOSIS — R42 Dizziness and giddiness: Secondary | ICD-10-CM | POA: Diagnosis not present

## 2017-06-14 DIAGNOSIS — R2681 Unsteadiness on feet: Secondary | ICD-10-CM | POA: Diagnosis not present

## 2017-06-14 NOTE — Therapy (Signed)
Cory Gonzalez [address]  [place], [place], [address] Phone: [phone]   Fax:  [phone]  Physical Therapy Treatment  Patient Details  Name: Cory Gonzalez MRN: [id number] Date of Birth: 1929-06-27 Referring Provider: Dr. Merrilee Seashore   Encounter Date: 06/14/2017  PT End of Session - 06/14/17 0841    Visit Number  8    Number of Visits  16    Date for PT Re-Evaluation  07/09/17    Authorization Type  Medicare    PT Start Time  0838    PT Stop Time  0921    PT Time Calculation (min)  43 min    Activity Tolerance  Patient tolerated treatment well    Behavior During Therapy  [facility] for tasks assessed/performed       Past Medical History:  Diagnosis Date  . Chronic diastolic heart failure ([place])   . Hypertension   . Paroxysmal atrial fibrillation (HCC)   . Rheumatoid arthritis [facility])     Past Surgical History:  Procedure Laterality Date  . CATARACT EXTRACTION    . PROSTATE SURGERY      There were no vitals filed for this visit.  Subjective Assessment - 06/14/17 0840    Subjective  doing well - doesn't feel like his balance is too good today    Pertinent History  HTN, RA    Patient Stated Goals  improve balance    Currently in Pain?  No/denies    Pain Score  0-No pain                       [facility] Adult PT Treatment/Exercise - 06/14/17 0001      Knee/Hip Exercises: Aerobic   Nustep  L5 x 6 min      Knee/Hip Exercises: Standing   Heel Raises  Both;15 reps 3#; toe raises x 15    Hip Abduction  Stengthening;Both;15 reps;Knee straight    Abduction Limitations  3#    Hip Extension  Stengthening;Both;15 reps;Knee straight    Extension Limitations  3#    Functional Squat  15 reps BUE support    Other Standing Knee Exercises  toe clear to 8" step - 3# at ankle x 15 reps      Knee/Hip Exercises: Seated   Long Arc Quad  Strengthening;Both;15 reps;Weights    Long Arc Quad Weight  3 lbs.           Balance Exercises - 06/14/17 0908      Balance Exercises: Standing   Tandem Stance  Eyes open;2 reps;30 secs B LE    SLS  Eyes open;Upper extremity support 1;5 reps;10 secs;Solid surface          PT Short Term Goals - 05/17/17 0840      PT SHORT TERM GOAL #1   Title  patient to be independent with initial HEP    Status  On-going      PT SHORT TERM GOAL #2   Title  patient to demonstrate gait speed of >/= 2.62 ft/sec without evidence of instability    Status  On-going        PT Long Term Goals - 05/17/17 0840      PT LONG TERM GOAL #1   Title  patient to be independent with advanced HEP    Status  On-going      PT LONG TERM GOAL #2   Title  patient to improve [employer]  to >/= 46/56 for reduced fall risk    Status  On-going      PT LONG TERM GOAL #3   Title  patient to improve DGI to >/=19 for reduced fall risk    Status  On-going      PT LONG TERM GOAL #4   Title  patient to report no falls for greater than 1 month    Status  On-going            Plan - 06/14/17 0842    Clinical Impression Statement  Cory Gonzalez doing well today - did a lot of activity yesterday in garden, with residual fatigue today. Subjective reports of feeling off balance, but no LOB or instability noted today in session. PT sesison focusing on LE strengthening as well as narrow BOS balance with good tolerance. Making good progess towards goals.     PT Treatment/Interventions  ADLs/Self Care Home Management;Cryotherapy;Electrical Stimulation;Moist Heat;Therapeutic exercise;Therapeutic activities;Functional mobility training;Stair training;Gait training;DME Instruction;Balance training;Neuromuscular re-education;Patient/family education;Manual techniques;Taping    Consulted and Agree with Plan of Care  Patient       Patient will benefit from skilled therapeutic intervention in order to improve the following deficits and impairments:  Decreased balance, Decreased mobility, Dizziness,  Decreased safety awareness  Visit Diagnosis: Unsteadiness on feet  Other abnormalities of gait and mobility  Dizziness and giddiness     Problem List There are no active problems to display for this patient.    Lanney Gins, PT, DPT 06/14/17 9:25 AM   [facility] [address]  [place], [place], [address] Phone: [phone]   Fax:  [phone]  Name: Cory Gonzalez MRN: [id number] Date of Birth: [birth year]

## 2017-06-18 ENCOUNTER — Ambulatory Visit: Payer: Medicare Other | Admitting: Physical Therapy

## 2017-06-18 ENCOUNTER — Encounter: Payer: Self-pay | Admitting: Physical Therapy

## 2017-06-18 DIAGNOSIS — R2681 Unsteadiness on feet: Secondary | ICD-10-CM | POA: Diagnosis not present

## 2017-06-18 DIAGNOSIS — R2689 Other abnormalities of gait and mobility: Secondary | ICD-10-CM | POA: Diagnosis not present

## 2017-06-18 DIAGNOSIS — R42 Dizziness and giddiness: Secondary | ICD-10-CM | POA: Diagnosis not present

## 2017-06-18 NOTE — Therapy (Signed)
[facility] [address]  [place], [place], [address] Phone: [phone]   Fax:  [phone]  Physical Therapy Treatment  Patient Details  Name: Cory Gonzalez MRN: [id number] Date of Birth: 03/16/1929 Referring Provider: Dr. Merrilee Seashore   Encounter Date: 06/18/2017  PT End of Session - 06/18/17 0843    Visit Number  9    Number of Visits  16    Date for PT Re-Evaluation  07/09/17    Authorization Type  Medicare    PT Start Time  0840    PT Stop Time  0921    PT Time Calculation (min)  41 min    Activity Tolerance  Patient tolerated treatment well    Behavior During Therapy  [facility] for tasks assessed/performed       Past Medical History:  Diagnosis Date  . Chronic diastolic heart failure ([place])   . Hypertension   . Paroxysmal atrial fibrillation (HCC)   . Rheumatoid arthritis [facility])     Past Surgical History:  Procedure Laterality Date  . CATARACT EXTRACTION    . PROSTATE SURGERY      There were no vitals filed for this visit.  Subjective Assessment - 06/18/17 0842    Subjective  feeling well - no new complaints; does have a slight black eye for unknown reason    Pertinent History  HTN, RA    Patient Stated Goals  improve balance    Currently in Pain?  No/denies    Pain Score  0-No pain                       [facility] Adult PT Treatment/Exercise - 06/18/17 0844      Knee/Hip Exercises: Aerobic   Nustep  L5 x 6 min          Balance Exercises - 06/18/17 0846      Balance Exercises: Standing   Standing Eyes Opened  Narrow base of support (BOS);Head turns;Foam/compliant surface horizontal and vertical head turns    Tandem Stance  Eyes open;Upper extremity support 1;1 rep;30 secs bilateral    Step Ups  Forward;Lateral;UE support 1 8" x 15 reps each    Sidestepping  2 reps;Theraband red tband - 2 x 20 feet each direction    Other Standing Exercises  resisted forward giat - 2 laps  around gym - black tband; gait with cognitive tasks - heavy VC for forward gaze          PT Short Term Goals - 05/17/17 0840      PT SHORT TERM GOAL #1   Title  patient to be independent with initial HEP    Status  On-going      PT SHORT TERM GOAL #2   Title  patient to demonstrate gait speed of >/= 2.62 ft/sec without evidence of instability    Status  On-going        PT Long Term Goals - 05/17/17 0840      PT LONG TERM GOAL #1   Title  patient to be independent with advanced HEP    Status  On-going      PT LONG TERM GOAL #2   Title  patient to improve Berg Balance to >/= 46/56 for reduced fall risk    Status  On-going      PT LONG TERM GOAL #3   Title  patient to improve DGI to >/=19 for reduced fall risk  Status  On-going      PT LONG TERM GOAL #4   Title  patient to report no falls for greater than 1 month    Status  On-going            Plan - 06/18/17 0844    Clinical Impression Statement  Ray doing well today - good tolerance and ability to perform standing balance activities with greater stability and less need for physical assist to maintain upright. Patient reporting no falls in a few weeks at home while maintaining high activity level on various surfaces.     PT Treatment/Interventions  ADLs/Self Care Home Management;Cryotherapy;Electrical Stimulation;Moist Heat;Therapeutic exercise;Therapeutic activities;Functional mobility training;Stair training;Gait training;DME Instruction;Balance training;Neuromuscular re-education;Patient/family education;Manual techniques;Taping    Consulted and Agree with Plan of Care  Patient       Patient will benefit from skilled therapeutic intervention in order to improve the following deficits and impairments:  Decreased balance, Decreased mobility, Dizziness, Decreased safety awareness  Visit Diagnosis: Unsteadiness on feet  Other abnormalities of gait and mobility  Dizziness and giddiness     Problem  List There are no active problems to display for this patient.   Lanney Gins, PT, DPT 06/18/17 9:29 AM   [facility] [address]  [place], [place], [address] Phone: [phone]   Fax:  [phone]  Name: Hung Rhinesmith MRN: [id number] Date of Birth: 1929/09/16

## 2017-06-25 ENCOUNTER — Ambulatory Visit: Payer: Medicare Other | Admitting: Physical Therapy

## 2017-06-25 ENCOUNTER — Encounter: Payer: Self-pay | Admitting: Physical Therapy

## 2017-06-25 DIAGNOSIS — R2681 Unsteadiness on feet: Secondary | ICD-10-CM | POA: Diagnosis not present

## 2017-06-25 DIAGNOSIS — R42 Dizziness and giddiness: Secondary | ICD-10-CM

## 2017-06-25 DIAGNOSIS — R2689 Other abnormalities of gait and mobility: Secondary | ICD-10-CM | POA: Diagnosis not present

## 2017-06-25 NOTE — Therapy (Signed)
[facility] [address]  [place], [place], [address] Phone: [phone]   Fax:  [phone]  Physical Therapy Treatment  Patient Details  Name: Cory Gonzalez MRN: [id number] Date of Birth: Apr 11, 1929 Referring Provider: Dr. Merrilee Seashore  Progress Note Reporting Period 05/14/17 to 06/25/17  See note below for Objective Data and Assessment of Progress/Goals.     Encounter Date: 06/25/2017  PT End of Session - 06/25/17 1359    Visit Number  10    Number of Visits  16    Date for PT Re-Evaluation  07/09/17    Authorization Type  Medicare    PT Start Time  1357    PT Stop Time  1443    PT Time Calculation (min)  46 min    Activity Tolerance  Patient tolerated treatment well    Behavior During Therapy  WFL for tasks assessed/performed       Past Medical History:  Diagnosis Date  . Chronic diastolic heart failure ([place])   . Hypertension   . Paroxysmal atrial fibrillation (HCC)   . Rheumatoid arthritis [facility])     Past Surgical History:  Procedure Laterality Date  . CATARACT EXTRACTION    . PROSTATE SURGERY      There were no vitals filed for this visit.  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  No/denies    Pain Score  0-No pain         [facility] PT Assessment - 06/25/17 0001      Ambulation/Gait   Gait velocity  3.5 ft/sec      Berg Balance Test   Sit to Stand  Able to stand without using hands and stabilize independently    Standing Unsupported  Able to stand safely 2 minutes    Sitting with Back Unsupported but Feet Supported on Floor or Stool  Able to sit safely and securely 2 minutes    Stand to Sit  Sits safely with minimal use of hands    Transfers  Able to transfer safely, minor use of hands    Standing Unsupported with Eyes Closed   Able to stand 10 seconds safely    Standing Ubsupported with Feet Together  Able to place feet together independently and stand for 1 minute with supervision    From Standing, Reach Forward with Outstretched Arm  Can reach forward >12 cm safely (5")    From Standing Position, Pick up Object from Floor  Able to pick up shoe safely and easily    From Standing Position, Turn to Look Behind Over each Shoulder  Looks behind from both sides and weight shifts well    Turn 360 Degrees  Able to turn 360 degrees safely but slowly    Standing Unsupported, Alternately Place Feet on Step/Stool  Able to complete >2 steps/needs minimal assist    Standing Unsupported, One Foot in Front  Needs help to step but can hold 15 seconds    Standing on One Leg  Tries to lift leg/unable to hold 3 seconds but remains standing independently    Total Score  43      Dynamic Gait Index   Level Surface  Normal    Change in Gait Speed  Normal    Gait  with Horizontal Head Turns  Normal    Gait with Vertical Head Turns  Mild Impairment    Gait and Pivot Turn  Mild Impairment    Step Over Obstacle  Mild Impairment    Step Around Obstacles  Normal    Steps  Mild Impairment    Total Score  20                   [facility] Adult PT Treatment/Exercise - 06/25/17 0001      Knee/Hip Exercises: Aerobic   Nustep  L5 x 6 min      Knee/Hip Exercises: Seated   Other Seated Knee/Hip Exercises  4 way resisted ankle - green tband x 15 reps each - bilateral               PT Short Term Goals - 06/25/17 1419      PT SHORT TERM GOAL #1   Title  patient to be independent with initial HEP    Status  Achieved      PT SHORT TERM GOAL #2   Title  patient to demonstrate gait speed of >/= 2.62 ft/sec without evidence of instability    Status  Achieved        PT Long Term Goals - 06/25/17 1423      PT LONG TERM GOAL #1   Title  patient to be independent with advanced HEP    Status  On-going      PT LONG TERM  GOAL #2   Title  patient to improve Berg Balance to >/= 46/56 for reduced fall risk    Status  On-going      PT LONG TERM GOAL #3   Title  patient to improve DGI to >/=19 for reduced fall risk    Status  Achieved      PT LONG TERM GOAL #4   Title  patient to report no falls for greater than 1 month    Status  Achieved            Plan - 06/25/17 1359    Clinical Impression Statement  patient presenting to PT today - general fatigue as patient has been busy with working recently. Does report continnued fear of falling on grassy and uneven surfaces - refuses to use cane during work. Patient making good progress with gaol testing today. Meeting DGI and gait speed goal, and nearly achieving Berg goal. Patient and PT to decide at next visit on continuation of treatment vs transition to HEP. Patient to continue to benefit from skilled PT intervention to address remaining balance deficits. No falls x 1 month.     PT Treatment/Interventions  ADLs/Self Care Home Management;Cryotherapy;Electrical Stimulation;Moist Heat;Therapeutic exercise;Therapeutic activities;Functional mobility training;Stair training;Gait training;DME Instruction;Balance training;Neuromuscular re-education;Patient/family education;Manual techniques;Taping    Consulted and Agree with Plan of Care  Patient       Patient will benefit from skilled therapeutic intervention in order to improve the following deficits and impairments:  Decreased balance, Decreased mobility, Dizziness, Decreased safety awareness  Visit Diagnosis: Unsteadiness on feet  Other abnormalities of gait and mobility  Dizziness and giddiness     Problem List There are no active problems to display for this patient.    Lanney Gins, PT, DPT 06/25/17 2:58 PM   [facility] [address]  [place], [place], [address] Phone: [phone]   Fax:  [phone]  Name: Cory Gonzalez MRN: [id number] Date of Birth:  05/07/1929   

## 2017-06-28 ENCOUNTER — Ambulatory Visit: Payer: Medicare Other | Admitting: Physical Therapy

## 2017-06-28 ENCOUNTER — Encounter: Payer: Self-pay | Admitting: Physical Therapy

## 2017-06-28 DIAGNOSIS — R2681 Unsteadiness on feet: Secondary | ICD-10-CM

## 2017-06-28 DIAGNOSIS — R2689 Other abnormalities of gait and mobility: Secondary | ICD-10-CM

## 2017-06-28 DIAGNOSIS — R42 Dizziness and giddiness: Secondary | ICD-10-CM

## 2017-06-28 NOTE — Therapy (Signed)
[facility] [address]  [place], [place], [address] Phone: [phone]   Fax:  [phone]  Physical Therapy Treatment  Patient Details  Name: Cory Gonzalez MRN: [id number] Date of Birth: 1929-02-11 Referring Provider: Dr. Merrilee Seashore   Encounter Date: 06/28/2017  PT End of Session - 06/28/17 0806    Visit Number  11    Number of Visits  16    Date for PT Re-Evaluation  07/09/17    Authorization Type  Medicare    PT Start Time  0803    PT Stop Time  0842    PT Time Calculation (min)  39 min    Activity Tolerance  Patient tolerated treatment well    Behavior During Therapy  [facility] for tasks assessed/performed       Past Medical History:  Diagnosis Date  . Chronic diastolic heart failure ([place])   . Hypertension   . Paroxysmal atrial fibrillation (HCC)   . Rheumatoid arthritis [facility])     Past Surgical History:  Procedure Laterality Date  . CATARACT EXTRACTION    . PROSTATE SURGERY      There were no vitals filed for this visit.  Subjective Assessment - 06/28/17 0805    Subjective  has had a busy week - feels like balance is better    Pertinent History  HTN, RA    Patient Stated Goals  improve balance    Currently in Pain?  No/denies    Pain Score  0-No pain                       [facility] Adult PT Treatment/Exercise - 06/28/17 0807      Knee/Hip Exercises: Aerobic   Nustep  L6 x 6 min          Balance Exercises - 06/28/17 0813      Balance Exercises: Standing   Standing Eyes Opened  Narrow base of support (BOS);Foam/compliant surface;Head turns    Standing Eyes Closed  Narrow base of support (BOS);Foam/compliant surface;30 secs    Step Ups  Forward 8" step - no UE support    Balance Beam  fwd/bwd tandem - UE support; side stepping - UE support    Sit to Stand Time  sit to stand from mat table - onto AirEx x 15 - UE support to push up    Other Standing Exercises  standing  alternating ball kicks - CGA to SUP          PT Short Term Goals - 06/25/17 1419      PT SHORT TERM GOAL #1   Title  patient to be independent with initial HEP    Status  Achieved      PT SHORT TERM GOAL #2   Title  patient to demonstrate gait speed of >/= 2.62 ft/sec without evidence of instability    Status  Achieved        PT Long Term Goals - 06/28/17 0949      PT LONG TERM GOAL #1   Title  patient to be independent with advanced HEP    Status  Achieved      PT LONG TERM GOAL #2   Title  patient to improve Berg Balance to >/= 46/56 for reduced fall risk    Status  Partially Met      PT LONG TERM GOAL #3   Title  patient to improve DGI to >/=19 for reduced fall  risk    Status  Achieved      PT LONG TERM GOAL #4   Title  patient to report no falls for greater than 1 month    Status  Achieved            Plan - 06/28/17 0807    Clinical Impression Statement  Cory Gonzalez doing well - has progressed balance wonderfully in all aspects including narrow BOS, compliant surfaces, eyes closed, as well as stepping outside of BOS. Discussion to continue to work on [employer] program at home and PT and patient both feel as if he can safely transition to home program at this time. Patient reports no falls in one month which a great achievement for him. Patient meeting or partially meeting all goals at this time. WIll plan to d/c from PT at this time. WIll gladly see patient in the future for any other concerns.     PT Treatment/Interventions  ADLs/Self Care Home Management;Cryotherapy;Electrical Stimulation;Moist Heat;Therapeutic exercise;Therapeutic activities;Functional mobility training;Stair training;Gait training;DME Instruction;Balance training;Neuromuscular re-education;Patient/family education;Manual techniques;Taping    Consulted and Agree with Plan of Care  Patient       Patient will benefit from skilled therapeutic intervention in order to improve the following deficits and  impairments:  Decreased balance, Decreased mobility, Dizziness, Decreased safety awareness  Visit Diagnosis: Unsteadiness on feet  Other abnormalities of gait and mobility  Dizziness and giddiness     Problem List There are no active problems to display for this patient.    Lanney Gins, PT, DPT 06/28/17 9:52 AM   [facility] [address]  [place], [place], [address] Phone: [phone]   Fax:  [phone]  Name: Cory Gonzalez MRN: [id number] Date of Birth: 1930/01/27

## 2017-07-02 ENCOUNTER — Ambulatory Visit: Payer: Medicare Other | Admitting: Physical Therapy

## 2017-07-04 DIAGNOSIS — M0589 Other rheumatoid arthritis with rheumatoid factor of multiple sites: Secondary | ICD-10-CM | POA: Diagnosis not present

## 2017-07-29 DIAGNOSIS — L570 Actinic keratosis: Secondary | ICD-10-CM | POA: Diagnosis not present

## 2017-07-29 DIAGNOSIS — C4441 Basal cell carcinoma of skin of scalp and neck: Secondary | ICD-10-CM | POA: Diagnosis not present

## 2017-07-29 DIAGNOSIS — X32XXXD Exposure to sunlight, subsequent encounter: Secondary | ICD-10-CM | POA: Diagnosis not present

## 2017-07-29 DIAGNOSIS — Z08 Encounter for follow-up examination after completed treatment for malignant neoplasm: Secondary | ICD-10-CM | POA: Diagnosis not present

## 2017-07-29 DIAGNOSIS — Z85828 Personal history of other malignant neoplasm of skin: Secondary | ICD-10-CM | POA: Diagnosis not present

## 2017-07-29 DIAGNOSIS — L739 Follicular disorder, unspecified: Secondary | ICD-10-CM | POA: Diagnosis not present

## 2017-08-28 DIAGNOSIS — X32XXXD Exposure to sunlight, subsequent encounter: Secondary | ICD-10-CM | POA: Diagnosis not present

## 2017-08-28 DIAGNOSIS — L570 Actinic keratosis: Secondary | ICD-10-CM | POA: Diagnosis not present

## 2017-08-28 DIAGNOSIS — Z85828 Personal history of other malignant neoplasm of skin: Secondary | ICD-10-CM | POA: Diagnosis not present

## 2017-08-28 DIAGNOSIS — Z08 Encounter for follow-up examination after completed treatment for malignant neoplasm: Secondary | ICD-10-CM | POA: Diagnosis not present

## 2017-08-29 DIAGNOSIS — M0589 Other rheumatoid arthritis with rheumatoid factor of multiple sites: Secondary | ICD-10-CM | POA: Diagnosis not present

## 2017-09-05 DIAGNOSIS — R42 Dizziness and giddiness: Secondary | ICD-10-CM | POA: Diagnosis not present

## 2017-09-05 DIAGNOSIS — M4120 Other idiopathic scoliosis, site unspecified: Secondary | ICD-10-CM | POA: Diagnosis not present

## 2017-09-05 DIAGNOSIS — Z79899 Other long term (current) drug therapy: Secondary | ICD-10-CM | POA: Diagnosis not present

## 2017-09-05 DIAGNOSIS — M0589 Other rheumatoid arthritis with rheumatoid factor of multiple sites: Secondary | ICD-10-CM | POA: Diagnosis not present

## 2017-09-10 ENCOUNTER — Emergency Department (HOSPITAL_BASED_OUTPATIENT_CLINIC_OR_DEPARTMENT_OTHER)
Admission: EM | Admit: 2017-09-10 | Discharge: 2017-09-10 | Disposition: A | Discharge status: Home / Self Care | Payer: Medicare Other | Attending: Emergency Medicine | Admitting: Emergency Medicine

## 2017-09-10 ENCOUNTER — Encounter (HOSPITAL_BASED_OUTPATIENT_CLINIC_OR_DEPARTMENT_OTHER): Payer: Self-pay | Admitting: *Deleted

## 2017-09-10 ENCOUNTER — Emergency Department (HOSPITAL_BASED_OUTPATIENT_CLINIC_OR_DEPARTMENT_OTHER): Payer: Medicare Other

## 2017-09-10 ENCOUNTER — Other Ambulatory Visit: Payer: Self-pay

## 2017-09-10 DIAGNOSIS — Y9289 Other specified places as the place of occurrence of the external cause: Secondary | ICD-10-CM | POA: Insufficient documentation

## 2017-09-10 DIAGNOSIS — S52024A Nondisplaced fracture of olecranon process without intraarticular extension of right ulna, initial encounter for closed fracture: Secondary | ICD-10-CM | POA: Insufficient documentation

## 2017-09-10 DIAGNOSIS — Y9389 Activity, other specified: Secondary | ICD-10-CM | POA: Insufficient documentation

## 2017-09-10 DIAGNOSIS — S4991XA Unspecified injury of right shoulder and upper arm, initial encounter: Secondary | ICD-10-CM | POA: Diagnosis not present

## 2017-09-10 DIAGNOSIS — S52034A Nondisplaced fracture of olecranon process with intraarticular extension of right ulna, initial encounter for closed fracture: Secondary | ICD-10-CM | POA: Diagnosis not present

## 2017-09-10 DIAGNOSIS — I110 Hypertensive heart disease with heart failure: Secondary | ICD-10-CM | POA: Insufficient documentation

## 2017-09-10 DIAGNOSIS — S52021A Displaced fracture of olecranon process without intraarticular extension of right ulna, initial encounter for closed fracture: Secondary | ICD-10-CM

## 2017-09-10 DIAGNOSIS — W01198A Fall on same level from slipping, tripping and stumbling with subsequent striking against other object, initial encounter: Secondary | ICD-10-CM | POA: Diagnosis not present

## 2017-09-10 DIAGNOSIS — M25511 Pain in right shoulder: Secondary | ICD-10-CM | POA: Insufficient documentation

## 2017-09-10 DIAGNOSIS — Y999 Unspecified external cause status: Secondary | ICD-10-CM | POA: Diagnosis not present

## 2017-09-10 DIAGNOSIS — Z7901 Long term (current) use of anticoagulants: Secondary | ICD-10-CM | POA: Diagnosis not present

## 2017-09-10 DIAGNOSIS — Z79899 Other long term (current) drug therapy: Secondary | ICD-10-CM | POA: Diagnosis not present

## 2017-09-10 DIAGNOSIS — W19XXXA Unspecified fall, initial encounter: Secondary | ICD-10-CM

## 2017-09-10 DIAGNOSIS — I5032 Chronic diastolic (congestive) heart failure: Secondary | ICD-10-CM | POA: Insufficient documentation

## 2017-09-10 DIAGNOSIS — F17210 Nicotine dependence, cigarettes, uncomplicated: Secondary | ICD-10-CM | POA: Diagnosis not present

## 2017-09-10 MED ORDER — HYDROCODONE-ACETAMINOPHEN 5-325 MG PO TABS: 1.0000 | ORAL_TABLET | ORAL | 0 refills | Status: DC | PRN

## 2017-09-10 MED ORDER — HYDROCODONE-ACETAMINOPHEN 5-325 MG PO TABS
1.0000 | ORAL_TABLET | Freq: Once | ORAL | Status: AC
  Administered 2017-09-10: 1 via ORAL
  Filled 2017-09-10: qty 1

## 2017-09-10 NOTE — Discharge Instructions (Signed)
You were evaluated in the emergency department for injuries from a fall.  You had x-rays of your shoulder and elbow and were found to have an elbow fracture.  We placed you in a splint and a sling.  You will need to keep the splint on until you follow-up with orthopedics.  Please call Dr. Narda Amber office tomorrow.  Tylenol for pain.

## 2017-09-10 NOTE — ED Provider Notes (Signed)
[facility] EMERGENCY DEPARTMENT Provider Note   CSN: [id number] Arrival date & time: 09/10/17  1951     History   Chief Complaint Chief Complaint  Patient presents with  . Fall    HPI Cory Gonzalez is a 84 y.o. male.  He presents after a fall outside when slipping on the wet grass.  He is right-hand dominant and he said he lost his balance on slipping and landed on his right elbow and also injured his right shoulder.  He is complaining of moderate right elbow pain and mild right shoulder pain.  They are both worse with any kind of movement.  Is not associated with any numbness.  He denies any other injuries and no loss of consciousness.  He is tried nothing for it.  The history is provided by the patient and the spouse.  Fall  This is a new problem. The current episode started 3 to 5 hours ago. The problem occurs constantly. The problem has not changed since onset.Pertinent negatives include no chest pain, no abdominal pain, no headaches and no shortness of breath. The symptoms are aggravated by bending. The symptoms are relieved by rest. He has tried rest for the symptoms. The treatment provided mild relief.    Past Medical History:  Diagnosis Date  . Chronic diastolic heart failure ([place])   . Hypertension   . Paroxysmal atrial fibrillation (HCC)   . Rheumatoid arthritis ([place])     There are no active problems to display for this patient.   Past Surgical History:  Procedure Laterality Date  . CATARACT EXTRACTION    . PROSTATE SURGERY          Home Medications    Prior to Admission medications   Medication Sig Start Date End Date Taking? Authorizing Provider  amLODipine (NORVASC) 10 MG tablet Take 1 tablet by mouth Daily. 08/06/11   [provider]  folic acid (FOLVITE) 1 MG tablet Take 1 mg by mouth 2 (two) times daily.    [provider]  InFLIXimab (REMICADE IV) Inject into the vein every 8 (eight) weeks.    [provider]    latanoprost (XALATAN) 0.005 % ophthalmic solution Place 1 drop into both eyes at bedtime.  11/01/14   [provider]  lisinopril-hydrochlorothiazide (PRINZIDE,ZESTORETIC) 20-12.5 MG tablet Take 1 tablet by mouth daily. 11/03/15   [provider]  methotrexate (RHEUMATREX) 2.5 MG tablet Take 10 mg by mouth once a week.  10/06/14   [provider]  Multiple Vitamin (MULITIVITAMIN WITH MINERALS) TABS Take 1 tablet by mouth daily.    [provider]  Omega-3 Fatty Acids (FISH OIL) 1000 MG CAPS Take 1 capsule by mouth daily.    [provider]  predniSONE (DELTASONE) 5 MG tablet Take 5 mg by mouth daily with breakfast.    [provider]  rivaroxaban (XARELTO) 20 MG TABS tablet Take 1 tablet (20 mg total) by mouth daily with supper. Patient not taking: Reported on 05/14/2017 05/03/15   Skeet Latch, MD  vitamin C (ASCORBIC ACID) 500 MG tablet Take 500 mg by mouth daily.    [provider]    Family History Family History  Problem Relation Age of Onset  . COPD Sister   . Dementia Sister   . Aneurysm Brother     Social History Social History   Tobacco Use  . Smoking status: Current Every Day Smoker    Years: 50.00    Types: Pipe  . Smokeless tobacco:  Former Systems developer  Substance Use Topics  . Alcohol use: No    Alcohol/week: 0.0 standard drinks  . Drug use: No     Allergies   Patient has no known allergies.   Review of Systems Review of Systems  Constitutional: Negative for fever.  HENT: Negative for sore throat.   Eyes: Negative for visual disturbance.  Respiratory: Negative for shortness of breath.   Cardiovascular: Negative for chest pain.  Gastrointestinal: Negative for abdominal pain.  Genitourinary: Negative for dysuria.  Skin: Negative for rash.  Neurological: Negative for headaches.     Physical Exam Updated Vital Signs BP (!) 153/87 (BP Location: Left Arm)   Pulse 65   Temp 98.7 F (37.1 C) (Oral)    Resp 16   Ht 5\' 5"  (1.651 m)   Wt 63.5 kg   SpO2 97%   BMI 23.30 kg/m   Physical Exam  Constitutional: He appears well-developed and well-nourished.  HENT:  Head: Normocephalic.  Nose: No sinus tenderness.  Eyes: Pupils are equal, round, and reactive to light. Conjunctivae and EOM are normal.  Neck: Neck supple.  Cardiovascular: Normal rate and regular rhythm.  No murmur heard. Pulmonary/Chest: Effort normal and breath sounds normal. No respiratory distress.  Abdominal: Soft. There is no tenderness.  Musculoskeletal: He exhibits no edema.  Patient has tenderness over his olecranon.  He does get some vague shoulder tenderness.  He is nontender forearm wrist and hand.  Distal intact motor and sensation.  Radial pulse 2+ and cap refill brisk.  Other extremities full range of motion without any tenderness or limitations.  Neurological: He is alert.  Skin: Skin is warm and dry. Capillary refill takes less than 2 seconds.  Psychiatric: He has a normal mood and affect.  Nursing note and vitals reviewed.    ED Treatments / Results  Labs (all labs ordered are listed, but only abnormal results are displayed) Labs Reviewed - No data to display  EKG None  Radiology Dg Shoulder Right  Result Date: 09/10/2017 CLINICAL DATA:  Initial evaluation for acute trauma, fall. EXAM: RIGHT SHOULDER - 2+ VIEW COMPARISON:  None. FINDINGS: No acute fracture or dislocation. Humeral head mildly subluxed superiorly relative to the glenoid, suggesting underlying rotator cuff pathology. Osteoarthritic changes about the acromioclavicular and glenohumeral articulations. No acute soft tissue abnormality. Osteopenia noted. IMPRESSION: 1. No acute osseous abnormality about the right shoulder. 2. 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 Electronically Signed   By: Jeannine Boga M.D.   On: 09/10/2017 20:55   Dg Elbow Complete Right  Result Date:  09/10/2017 CLINICAL DATA:  Initial evaluation for acute trauma, fall. EXAM: RIGHT ELBOW - COMPLETE 3+ VIEW COMPARISON:  None. FINDINGS: There is an acute oblique nondisplaced fracture through the olecranon with intra-articular extension. Radial head intact. Distal humerus intact. Soft tissue swelling at the posterior elbow. Scattered osteoarthritic changes noted about the elbow. IMPRESSION: Acute nondisplaced fracture through the olecranon with intra-articular extension. Electronically Signed   By: Jeannine Boga M.D.   On: 09/10/2017 20:52    Procedures Procedures (including critical care time)  Medications Ordered in ED Medications - No data to display   Initial Impression / Assessment and Plan / ED Course  I have reviewed the triage vital signs and the nursing notes.  Pertinent labs & imaging results that were available during my care of the patient were reviewed by me and considered in my medical decision making (see chart for details).  Clinical Course as of Aug  [place] Sep 10, 2017  2121 Discussed with Dr. Lorin Mercy from orthopedics.  He recommends placing the patient in a long-arm posterior splint and sling and he can see them in the office tomorrow or Friday.  I relayed these instructions to the patient and his spouse and they understand.   [MB]  2159 Splint applied by tech and when I reevaluated him he is in good position with normal CSMs distal.  He understands the plan for follow-up.  He got a dose of hydrocodone here and his wife and he both understand that it may be sedating and to be careful of it.   [MB]    Clinical Course User Index [MB] Hayden Rasmussen, MD    Final Clinical Impressions(s) / ED Diagnoses   Final diagnoses:  Closed fracture of right olecranon process, initial encounter  Acute pain of right shoulder  Fall, initial encounter    ED Discharge Orders    None       Hayden Rasmussen, MD 09/11/17 1143

## 2017-09-10 NOTE — ED Notes (Signed)
ED Provider at bedside. 

## 2017-09-10 NOTE — ED Triage Notes (Signed)
He slipped outside and fell. Injury to his right elbow and shoulder.

## 2017-09-10 NOTE — ED Notes (Signed)
Pt offered ice, refused.

## 2017-09-11 ENCOUNTER — Ambulatory Visit (INDEPENDENT_AMBULATORY_CARE_PROVIDER_SITE_OTHER): Payer: Medicare Other | Admitting: Orthopaedic Surgery

## 2017-09-11 ENCOUNTER — Encounter (INDEPENDENT_AMBULATORY_CARE_PROVIDER_SITE_OTHER): Payer: Self-pay | Admitting: Orthopaedic Surgery

## 2017-09-11 VITALS — BP 135/86 | HR 68 | Ht 65.0 in | Wt 140.0 lb

## 2017-09-11 DIAGNOSIS — S52021A Displaced fracture of olecranon process without intraarticular extension of right ulna, initial encounter for closed fracture: Secondary | ICD-10-CM

## 2017-09-11 NOTE — Progress Notes (Signed)
Office Visit Note   Patient: Cory Gonzalez           Date of Birth: 23-Dec-1929           MRN: [id number] Visit Date: 09/11/2017              Requested by: Thressa Sheller, [address], [place], [place] [address] PCP: [facility]   Assessment & Plan: Visit Diagnoses:  1. Closed fracture of right olecranon process, initial encounter     Plan: Plan ORIF right olecranon.  This is an intra-articular fracture and there is an intra-articular gap of 8 mm.  We discussed screw fixation with figure-of-eight tension band wiring.  Possible upper extremity block with general anesthesia.  Risks of anesthesia heart attack stroke discussed.  He understands and requests we proceed.  Follow-Up Instructions: No follow-ups on file.   Orders:  No orders of the defined types were placed in this encounter.  No orders of the defined types were placed in this encounter.     Procedures: No procedures performed   Clinical Data: No additional findings.   Subjective: Chief Complaint  Patient presents with  . Right Shoulder - Pain  . Right Elbow - Fracture    HPI 84 year old male fell outside when he slipped on the wet grass suffering a right displaced olecranon fracture.  He has had long-standing problems with his right shoulder with chronic rotator cuff full-thickness tear.  He was unable to extend his elbow and x-rays the emergency room demonstrate a displaced olecranon fracture intra-articular.  Patient had a history of atrial fib at one point he was started on Xarelto but is no longer on it.  He had history of diastolic dysfunction with ejection fraction of 60 to 65% in April 2017.  He saw  Dr. Skeet Latch at [facility] heart care.  Current meds include Norvasc Folvite, Remicade Rheumatrex, HCTZ 5 mg prednisone daily, vitamin C.  Review of Systems is systems positive for chronic heart failure with ejection fraction 2017 of 60 to 65%.  History of hypertension,  rheumatoid arthritis, chronic right rotator cuff tear, paroxysmal atrial fib.  Negative for chest pain.   Objective: Vital Signs: BP 135/86   Pulse 68   Ht 5\' 5"  (1.651 m)   Wt 140 lb (63.5 kg)   BMI 23.30 kg/m   Physical Exam  Constitutional: He is oriented to person, place, and time. He appears well-developed and well-nourished.  HENT:  Head: Normocephalic and atraumatic.  Eyes: Pupils are equal, round, and reactive to light. EOM are normal.  Neck: No tracheal deviation present. No thyromegaly present.  Cardiovascular: Normal rate.  Pulmonary/Chest: Effort normal. He has no wheezes.  Abdominal: Soft. Bowel sounds are normal.  Neurological: He is alert and oriented to person, place, and time.  Skin: Skin is warm and dry. Capillary refill takes less than 2 seconds.  Psychiatric: He has a normal mood and affect. His behavior is normal. Judgment and thought content normal.    Ortho Exam patient send a posterior right upper extremity splint well-padded.  There is some ecchymosis over the wrist.  Ulnar sensation in the fingertips is intact.  Crepitus with right shoulder range of motion.  Specialty Comments:  No specialty comments available.  Imaging: Dg Shoulder Right  Result Date: 09/10/2017 CLINICAL DATA:  Initial evaluation for acute trauma, fall. EXAM: RIGHT SHOULDER - 2+ VIEW COMPARISON:  None. FINDINGS: No acute fracture or dislocation. Humeral head mildly subluxed superiorly relative to the  glenoid, suggesting underlying rotator cuff pathology. Osteoarthritic changes about the acromioclavicular and glenohumeral articulations. No acute soft tissue abnormality. Osteopenia noted. IMPRESSION: 1. No acute osseous abnormality about the right shoulder. 2. 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 Electronically Signed   By: Jeannine Boga M.D.   On: 09/10/2017 20:55   Dg Elbow Complete Right  Result Date: 09/10/2017 CLINICAL DATA:   Initial evaluation for acute trauma, fall. EXAM: RIGHT ELBOW - COMPLETE 3+ VIEW COMPARISON:  None. FINDINGS: There is an acute oblique nondisplaced fracture through the olecranon with intra-articular extension. Radial head intact. Distal humerus intact. Soft tissue swelling at the posterior elbow. Scattered osteoarthritic changes noted about the elbow. IMPRESSION: Acute nondisplaced fracture through the olecranon with intra-articular extension. Electronically Signed   By: Jeannine Boga M.D.   On: 09/10/2017 20:52     PMFS History: There are no active problems to display for this patient.  Past Medical History:  Diagnosis Date  . Chronic diastolic heart failure ([place])   . Hypertension   . Paroxysmal atrial fibrillation (HCC)   . Rheumatoid arthritis ([place])     Family History  Problem Relation Age of Onset  . COPD Sister   . Dementia Sister   . Aneurysm Brother     Past Surgical History:  Procedure Laterality Date  . CATARACT EXTRACTION    . PROSTATE SURGERY     Social History   Occupational History  . Not on file  Tobacco Use  . Smoking status: Current Every Day Smoker    Years: 50.00    Types: Pipe  . Smokeless tobacco: Former Network engineer and Sexual Activity  . Alcohol use: No    Alcohol/week: 0.0 standard drinks  . Drug use: No  . Sexual activity: Not on file

## 2017-09-12 ENCOUNTER — Telehealth (INDEPENDENT_AMBULATORY_CARE_PROVIDER_SITE_OTHER): Payer: Self-pay | Admitting: Orthopaedic Surgery

## 2017-09-12 DIAGNOSIS — I5032 Chronic diastolic (congestive) heart failure: Secondary | ICD-10-CM | POA: Diagnosis not present

## 2017-09-12 DIAGNOSIS — I4891 Unspecified atrial fibrillation: Secondary | ICD-10-CM | POA: Diagnosis not present

## 2017-09-12 DIAGNOSIS — Z01818 Encounter for other preprocedural examination: Secondary | ICD-10-CM | POA: Diagnosis not present

## 2017-09-12 DIAGNOSIS — J449 Chronic obstructive pulmonary disease, unspecified: Secondary | ICD-10-CM | POA: Diagnosis not present

## 2017-09-12 DIAGNOSIS — I10 Essential (primary) hypertension: Secondary | ICD-10-CM | POA: Diagnosis not present

## 2017-09-12 NOTE — Telephone Encounter (Signed)
FYI. Please advise.

## 2017-09-12 NOTE — Telephone Encounter (Signed)
Push surgery to Wednesday. thanks

## 2017-09-12 NOTE — Telephone Encounter (Signed)
See below

## 2017-09-12 NOTE — Telephone Encounter (Signed)
Patient scheduled for surgery Monday August 19th at [facility].  Medical clearance has been received by patient's PCP Dr. Ashby Dawes at [facility]  [phone], however patient is not cleared by his PCP from a Cardiac Standpoint. Patient has an appointment to see Cardiologist Skeet Latch 4pm on Tuesday August 20th (day after scheduled surgery).  I called [facility] and spoke with Aveletta [phone] requesting earlier appointment for cardiac clearance, but she states they are completely booked and the patient has not been seen in 2 years.

## 2017-09-13 NOTE — Progress Notes (Addendum)
Anesthesia Chart Review: SAME DAY WORK-UP  Case:  [id number] Date/Time:  09/18/17 1459   Procedure:  OPEN REDUCTION INTERNAL FIXATION (ORIF) RIGHT ELBOW/OLECRANON FRACTURE (Right )   Anesthesia type:  General   Pre-op diagnosis:  right displaced olecranon fx   Location:  [facility] OR ROOM 06 / [place] OR   Surgeon:  [name], MD      DISCUSSION: Patient is a 84 year old male scheduled for the above procedure.  History includes smoking, PAF, chronic diastolic CHF, HTN, RA (on prednisone, methotrexate, Remicade).  Cardiologist is Skeet Latch, MD, but last visit was in 2017. Patient is scheduled to see Dr. Oval Linsey for follow-up and preoperative evaluation on 09/17/17. He was taking Xarelto for PAF, but is listed as no longer taking.  Chart will be left for follow-up cardiology recommendations.  ADDENDUM 09/17/17 5:40 PM: Patient was seen by cardiologist Dr. Oval Linsey this afternoon. She wrote: "# Preoperative risk assessment: [name] has no exertional symptoms or unstable cardiac conditions.  He is able to achieve >4 METs and does not require any further cardiac testing.  He is at acceptable risk for surgery." She discussed pros/cons of anticoagulation, and he is willing to resume Xarelto postoperatively when deemed stable by his surgeon. She was recommending a lower dose, 15 mg instead of 20 mg.  He is a same day work-up, so he will get labs on arrival, but if results are acceptable then I would anticipate that he can proceed as planned.     PROVIDERS: - PCP is with [facility]. Reportedly, patient was given medical clearance by Merrilee Seashore, MD for surgery, but recommended cardiology clearance as well. Records pending. - Skeet Latch, MD is cardiologist.   LABS: Unless more recent labs received then he will need updated labs on the day of surgery.   IMAGES: Xray right elbow 09/10/17: IMPRESSION: Acute nondisplaced fracture through the olecranon  with intra-articular extension.   EKG: 12/07/16: SR, atrial premature complexes. LAFB. Reverse r wave progression V2-3, consider anterior infarct.    CV: Nuclear stress test 05/13/15:  Normal Lexiscan myoview study  There was no ST segment deviation noted during stress.  The study is normal.  This is a low risk study.  Nuclear stress EF: 65%. Normal LV function  Echo 05/13/15: Study Conclusions - Left ventricle: The cavity size was normal. Systolic function was   normal. The estimated ejection fraction was in the range of 60%   to 65%. Wall motion was normal; there were no regional wall   motion abnormalities. Doppler parameters are consistent with   abnormal left ventricular relaxation (grade 1 diastolic   dysfunction). - Aortic valve: Valve area (VTI): 2.6 cm^2. Valve area (Vmax): 2.42   cm^2. - Mitral valve: Calcified annulus.   Past Medical History:  Diagnosis Date  . Chronic diastolic heart failure ([place])   . Hypertension   . Paroxysmal atrial fibrillation (HCC)   . Rheumatoid arthritis [facility])     Past Surgical History:  Procedure Laterality Date  . CATARACT EXTRACTION    . PROSTATE SURGERY      MEDICATIONS: No current facility-administered medications for this encounter.    [name] Kitchen amLODipine (NORVASC) 10 MG tablet  . folic acid (FOLVITE) 1 MG tablet  . ibuprofen (ADVIL,MOTRIN) 200 MG tablet  . InFLIXimab (REMICADE IV)  . latanoprost (XALATAN) 0.005 % ophthalmic solution  . lisinopril-hydrochlorothiazide (PRINZIDE,ZESTORETIC) 20-12.5 MG tablet  . methotrexate (RHEUMATREX) 2.5 MG tablet  . Multiple Vitamin (MULITIVITAMIN WITH MINERALS) TABS  .  polyethylene glycol (MIRALAX / GLYCOLAX) packet  . predniSONE (DELTASONE) 5 MG tablet  . vitamin C (ASCORBIC ACID) 500 MG tablet  . HYDROcodone-acetaminophen (NORCO/VICODIN) 5-325 MG tablet  . rivaroxaban (XARELTO) 20 MG TABS tablet  Xarelto is listed as "not taking".   George Hugh [facility] Short Stay  Center/Anesthesiology Phone [phone] 09/13/2017 4:06 PM

## 2017-09-16 ENCOUNTER — Encounter (HOSPITAL_COMMUNITY): Payer: Self-pay | Admitting: *Deleted

## 2017-09-16 ENCOUNTER — Other Ambulatory Visit: Payer: Self-pay

## 2017-09-16 NOTE — Progress Notes (Signed)
Spoke with Cory Gonzalez's wife for pre-op call, Cory Gonzalez handed her the phone when I called. Cory Gonzalez has hx of A-fib per his medical record, Cory Gonzalez's wife states that she's not sure if he really had it and if he did it was just one time. She states he stopped the Xarelto "probably about 2 years ago". Cory Gonzalez has not seen Dr. Oval Linsey since 2017, he has an appt with her tomorrow for medical clearance. Stressed the importance of Cory Gonzalez going to that appt tomorrow to his wife. She voiced understanding. Mrs. Watchman states Cory Gonzalez has had a heart murmur since he was a child and has never had any issues with it. She states Cory Gonzalez is not diabetic.  Instructed Mrs. Chenard to have Cory Gonzalez hold his Multivitamins and NSAIDS as of today prior to surgery.

## 2017-09-17 ENCOUNTER — Encounter: Payer: Self-pay | Admitting: Cardiovascular Disease

## 2017-09-17 ENCOUNTER — Ambulatory Visit (INDEPENDENT_AMBULATORY_CARE_PROVIDER_SITE_OTHER): Payer: Medicare Other | Admitting: Cardiovascular Disease

## 2017-09-17 ENCOUNTER — Encounter: Payer: Self-pay | Admitting: *Deleted

## 2017-09-17 VITALS — BP 110/60 | HR 51 | Ht 65.5 in | Wt 143.8 lb

## 2017-09-17 DIAGNOSIS — Z01818 Encounter for other preprocedural examination: Secondary | ICD-10-CM

## 2017-09-17 DIAGNOSIS — I10 Essential (primary) hypertension: Secondary | ICD-10-CM

## 2017-09-17 DIAGNOSIS — I2510 Atherosclerotic heart disease of native coronary artery without angina pectoris: Secondary | ICD-10-CM

## 2017-09-17 DIAGNOSIS — I480 Paroxysmal atrial fibrillation: Secondary | ICD-10-CM

## 2017-09-17 DIAGNOSIS — E7800 Pure hypercholesterolemia, unspecified: Secondary | ICD-10-CM | POA: Diagnosis not present

## 2017-09-17 DIAGNOSIS — I4891 Unspecified atrial fibrillation: Secondary | ICD-10-CM

## 2017-09-17 MED ORDER — RIVAROXABAN 15 MG PO TABS
15.0000 mg | ORAL_TABLET | Freq: Every day | ORAL | 5 refills | Status: DC
Start: 2017-09-17 — End: 2023-08-14

## 2017-09-17 NOTE — Progress Notes (Signed)
Cardiology Office Note   Date:  09/17/2017   ID:  [name], DOB 1929/11/13, MRN [id number]  PCP:  [facility]  Cardiologist:   Skeet Latch, MD   No chief complaint on file.   History of Present Illness: Cory Gonzalez is a 84 y.o. male with hypertension, chronic diastolic heart failure, paroxysmal atrial fibrillation, COPD, CKD 3 and RA who presents for follow up.   Mr. Keadle saw his PCP, Dr. Thressa Sheller on 04/12/15 and was noted to be in atrial fibrillation with a heart rate of 56. He was started on Xarelto and scheduled to follow-up with cardiology.  He was seen in clinic 04/2015 and referred for an echo that revealed LVEF 60-65% with grade 1 diastolic dysfunction.  At that appointment he also reported episodes of intermittent atypical chest pain and was referred for [facility] that was negative for ischemia.  Since his last appointment Mr. Taborda has been doing well.  He denies any chest pain or shortness of breath. He hasn't noted any palpitations, lightheadedness or dizziness.  He continues to be active but his balance is poor.  Earlier this month he was throwing away a garbage bag when he lost his balance and fell.  There was no preceding chest pain or shortness of breath.  He has fallen every month or two due to losing his balance.  This time he broke his elbow.  He has a cane but doesn't like using it.  He continues to be active by mowing his lawn.  He is not convinced that he actually has atrial fibrillation.  The prescription ran out approximately 1 year ago.  He did not have any symptoms when he was in atrial fibrillation and denies any recent palpitations, lightheadedness, or dizziness.  He has not noted any chest pain or shortness of breath.  He sometimes has lower extremity edema to the ankles but denies orthopnea or PND.  It typically improves with elevation of his legs.  He is able to walk more than 4 blocks or up a flight of stairs without any chest  pain or shortness of breath.   Past Medical History:  Diagnosis Date  . Chronic diastolic heart failure ([place])   . Heart murmur    since childhood, never has had any problems  . Hypertension   . Paroxysmal atrial fibrillation (HCC)   . Pneumonia   . PONV (postoperative nausea and vomiting)    a little nausea  . Rheumatoid arthritis [facility])     Past Surgical History:  Procedure Laterality Date  . CATARACT EXTRACTION Bilateral   . COLONOSCOPY    . PROSTATE SURGERY       Current Outpatient Medications  Medication Sig Dispense Refill  . amLODipine (NORVASC) 10 MG tablet Take 10 mg by mouth daily.     . folic acid (FOLVITE) 1 MG tablet Take 2 mg by mouth daily.     . InFLIXimab (REMICADE IV) Inject into the vein every 8 (eight) weeks.    [name] Kitchen latanoprost (XALATAN) 0.005 % ophthalmic solution Place 1 drop into both eyes at bedtime.   10  . lisinopril-hydrochlorothiazide (PRINZIDE,ZESTORETIC) 20-12.5 MG tablet Take 1 tablet by mouth daily.  0  . methotrexate (RHEUMATREX) 2.5 MG tablet Take 10 mg by mouth every Friday.   1  . Multiple Vitamin (MULITIVITAMIN WITH MINERALS) TABS Take 1 tablet by mouth daily.    . polyethylene glycol (MIRALAX / GLYCOLAX) packet Take 17 g by mouth daily as needed for  moderate constipation.    . predniSONE (DELTASONE) 5 MG tablet Take 5 mg by mouth daily with breakfast.    . rivaroxaban (XARELTO) 15 MG TABS tablet Take 1 tablet (15 mg total) by mouth daily with supper. 30 tablet 5  . vitamin C (ASCORBIC ACID) 500 MG tablet Take 500 mg by mouth daily.     No current facility-administered medications for this visit.     Allergies:   Patient has no known allergies.    Social History:  The patient  reports that he has been smoking pipe. He has smoked for the past 50.00 years. He has quit using smokeless tobacco. He reports that he does not drink alcohol or use drugs.   Family History:  The patient's family history includes Aneurysm in his brother; COPD in his  sister; Dementia in his sister.    ROS:  Please see the history of present illness.   Otherwise, review of systems are positive for none.   All other systems are reviewed and negative.    PHYSICAL EXAM: VS:  BP 110/60   Pulse (!) 51   Ht 5' 5.5" (1.664 m)   Wt 143 lb 12.8 oz (65.2 kg)   BMI 23.57 kg/m  , BMI Body mass index is 23.57 kg/m. GENERAL:  Well appearing HEENT: Pupils equal round and reactive, fundi not visualized, oral mucosa unremarkable NECK:  No jugular venous distention, waveform within normal limits, carotid upstroke brisk and symmetric, no bruits, no thyromegaly LYMPHATICS:  No cervical adenopathy LUNGS:  Clear to auscultation bilaterally HEART:  RRR.  PMI not displaced or sustained, ABD:  Flat, positive bowel sounds normal in frequency in pitch, no bruits, no rebound, no guarding, no midline pulsatile mass, no hepatomegaly, no splenomegaly EXT:  2 plus pulses throughout, 1+ lower extremity edem to the ankles bilaterally, no cyanosis no clubbing SKIN:  No rashes no nodules NEURO:  Cranial nerves II through XII grossly intact, motor grossly intact throughout PSYCH:  Cognitively intact, oriented to person place and time   EKG:  EKG is ordered today. The ekg ordered 05/03/15 demonstrates sinus rhythm rate 72 bpm.    04/12/15: Atrial fibrillation. Rate 60 bpm. Left axis deviation. 09/17/17: Sinus bradycardia.  Rate 51 bpm.    Echo 05/13/15: LVEF 60-65%.  Grade 1 diastolic dysfunction.  Lexiscan Myoview 05/13/15:   Normal Lexiscan myoview study  There was no ST segment deviation noted during stress.  The study is normal.  This is a low risk study.  Nuclear stress EF: 65%. Normal LV function    Recent Labs: 12/07/2016: ALT 21; BUN 15; Creatinine, Ser 1.11; Hemoglobin 14.4; Platelets 188; Potassium 3.5; Sodium 134   04/25/15: WBC 5.0, hemoglobin 13.1, hematocrit 38, platelets 208 Sodium 136, potassium 4, BUN 24, creatinine 1 AST 31, ALT 27   Lipid Panel No  results found for: CHOL, TRIG, HDL, CHOLHDL, VLDL, LDLCALC, LDLDIRECT    Wt Readings from Last 3 Encounters:  09/17/17 143 lb 12.8 oz (65.2 kg)  09/11/17 140 lb (63.5 kg)  09/10/17 140 lb (63.5 kg)      ASSESSMENT AND PLAN:  # Paroxysmal atrial fibrillation:  Mr. Krist remains in this rhythm.  He has not had any known recurrent episodes of atrial fibrillation.  He was asymptomatic when in atrial fibrillation.  We discussed the pros and cons of anticoagulation.  He is willing to resume Xarelto postoperatively when deemed stable by his orthopedic surgeon.  His weight is on the borderline for dosing of Xarelto.  Given that he is over 80 we will start with Xarelto 15 mg daily instead of 20 mg despite the fact that his renal function is okay.  Given his frequent falls I would rather air on the side of less anticoagulation.  Encouraged him to use his cane when walking.  No beta blocker 2/2 bradycardia.  This patients CHA2DS2-VASc Score and unadjusted Ischemic Stroke Rate (% per year) is equal to 3.2 % stroke rate/year from a score of 3 Above score calculated as 1 point each if present [CHF, HTN, DM, Vascular=MI/PAD/Aortic Plaque, Age if 65-74, or Male] Above score calculated as 2 points each if present [Age > 75, or Stroke/TIA/TE]  # Preoperative risk assessment: Mr. Usery has no exertional symptoms or unstable cardiac conditions.  He is able to achieve >4 METs and does not require any further cardiac testing.  He is at acceptable risk for surgery.  # Murmur: Stable and lifelong.  No significant valvular disease on echo in 2017.  # Hypertension: Blood pressure well-controlled on amlodipine, lisinopril and HCTZ.   Current medicines are reviewed at length with the patient today.  The patient does not have concerns regarding medicines.  The following changes have been made:  Restart Xarelto post-op.  Labs/ tests ordered today include:   Orders Placed This Encounter  Procedures  . EKG 12-Lead      Disposition:   FU with Quill Grinder C. Oval Linsey, MD, [facility] 6 months     Signed, [place] Oval Linsey, MD, [facility]  09/17/2017 5:17 PM    [place]

## 2017-09-17 NOTE — Patient Instructions (Signed)
Medication Instructions:  START XARELTO 15 MG DAILY WITH YOUR EVENING MEAL WHEN DR Lorin Mercy FEELS IT IS OK FOR YOU TO BEGIN AFTER SURGERY.   DO NOT TAKE ADVIL/IBUPROFEN AFTER YOU BEGIN XARELTO   Labwork: NONE  Testing/Procedures: NONE  Follow-Up: Your physician recommends that you schedule a follow-up appointment in: 3 MONTHS  Any Other Special Instructions Will Be Listed Below (If Applicable).  YOU ARE CLEARED FROM A CARDIAC STANDPOINT FOR SURGERY  If you need a refill on your cardiac medications before your next appointment, please call your pharmacy.

## 2017-09-18 ENCOUNTER — Ambulatory Visit (HOSPITAL_COMMUNITY): Payer: Medicare Other | Admitting: Vascular Surgery

## 2017-09-18 ENCOUNTER — Encounter (HOSPITAL_COMMUNITY)
Admission: RE | Disposition: A | Discharge status: Home / Self Care | Payer: Self-pay | Source: Ambulatory Visit | Attending: Orthopaedic Surgery

## 2017-09-18 ENCOUNTER — Telehealth: Payer: Self-pay | Admitting: Cardiology

## 2017-09-18 ENCOUNTER — Inpatient Hospital Stay (HOSPITAL_COMMUNITY): Payer: Medicare Other

## 2017-09-18 ENCOUNTER — Observation Stay (HOSPITAL_COMMUNITY): Payer: Medicare Other

## 2017-09-18 ENCOUNTER — Ambulatory Visit (HOSPITAL_COMMUNITY): Payer: Medicare Other

## 2017-09-18 ENCOUNTER — Encounter (HOSPITAL_COMMUNITY): Payer: Self-pay | Admitting: *Deleted

## 2017-09-18 ENCOUNTER — Other Ambulatory Visit: Payer: Self-pay

## 2017-09-18 ENCOUNTER — Inpatient Hospital Stay (HOSPITAL_COMMUNITY)
Admission: RE | Admit: 2017-09-18 | Discharge: 2017-09-24 | DRG: 511 | Disposition: A | Discharge status: Home / Self Care | Payer: Medicare Other | Source: Ambulatory Visit | Attending: Orthopaedic Surgery | Admitting: Orthopaedic Surgery

## 2017-09-18 DIAGNOSIS — Z79899 Other long term (current) drug therapy: Secondary | ICD-10-CM | POA: Diagnosis not present

## 2017-09-18 DIAGNOSIS — J95812 Postprocedural air leak: Secondary | ICD-10-CM | POA: Diagnosis not present

## 2017-09-18 DIAGNOSIS — Z09 Encounter for follow-up examination after completed treatment for conditions other than malignant neoplasm: Secondary | ICD-10-CM

## 2017-09-18 DIAGNOSIS — T797XXA Traumatic subcutaneous emphysema, initial encounter: Secondary | ICD-10-CM | POA: Diagnosis not present

## 2017-09-18 DIAGNOSIS — J9311 Primary spontaneous pneumothorax: Secondary | ICD-10-CM | POA: Diagnosis not present

## 2017-09-18 DIAGNOSIS — I5032 Chronic diastolic (congestive) heart failure: Secondary | ICD-10-CM | POA: Diagnosis not present

## 2017-09-18 DIAGNOSIS — M069 Rheumatoid arthritis, unspecified: Secondary | ICD-10-CM | POA: Diagnosis present

## 2017-09-18 DIAGNOSIS — Z4682 Encounter for fitting and adjustment of non-vascular catheter: Secondary | ICD-10-CM

## 2017-09-18 DIAGNOSIS — Y838 Other surgical procedures as the cause of abnormal reaction of the patient, or of later complication, without mention of misadventure at the time of the procedure: Secondary | ICD-10-CM | POA: Diagnosis not present

## 2017-09-18 DIAGNOSIS — I110 Hypertensive heart disease with heart failure: Secondary | ICD-10-CM | POA: Diagnosis present

## 2017-09-18 DIAGNOSIS — S52021D Displaced fracture of olecranon process without intraarticular extension of right ulna, subsequent encounter for closed fracture with routine healing: Secondary | ICD-10-CM | POA: Diagnosis not present

## 2017-09-18 DIAGNOSIS — I480 Paroxysmal atrial fibrillation: Secondary | ICD-10-CM | POA: Diagnosis present

## 2017-09-18 DIAGNOSIS — G8918 Other acute postprocedural pain: Secondary | ICD-10-CM | POA: Diagnosis not present

## 2017-09-18 DIAGNOSIS — W19XXXA Unspecified fall, initial encounter: Secondary | ICD-10-CM | POA: Diagnosis present

## 2017-09-18 DIAGNOSIS — S52023A Displaced fracture of olecranon process without intraarticular extension of unspecified ulna, initial encounter for closed fracture: Secondary | ICD-10-CM | POA: Diagnosis present

## 2017-09-18 DIAGNOSIS — K5900 Constipation, unspecified: Secondary | ICD-10-CM | POA: Diagnosis not present

## 2017-09-18 DIAGNOSIS — J95811 Postprocedural pneumothorax: Secondary | ICD-10-CM | POA: Diagnosis not present

## 2017-09-18 DIAGNOSIS — I10 Essential (primary) hypertension: Secondary | ICD-10-CM | POA: Diagnosis not present

## 2017-09-18 DIAGNOSIS — F17200 Nicotine dependence, unspecified, uncomplicated: Secondary | ICD-10-CM | POA: Diagnosis present

## 2017-09-18 DIAGNOSIS — Z7952 Long term (current) use of systemic steroids: Secondary | ICD-10-CM | POA: Diagnosis not present

## 2017-09-18 DIAGNOSIS — J939 Pneumothorax, unspecified: Secondary | ICD-10-CM | POA: Diagnosis not present

## 2017-09-18 DIAGNOSIS — S52021A Displaced fracture of olecranon process without intraarticular extension of right ulna, initial encounter for closed fracture: Principal | ICD-10-CM | POA: Diagnosis present

## 2017-09-18 DIAGNOSIS — J9811 Atelectasis: Secondary | ICD-10-CM | POA: Diagnosis not present

## 2017-09-18 DIAGNOSIS — J439 Emphysema, unspecified: Secondary | ICD-10-CM | POA: Diagnosis not present

## 2017-09-18 DIAGNOSIS — Z419 Encounter for procedure for purposes other than remedying health state, unspecified: Secondary | ICD-10-CM

## 2017-09-18 HISTORY — DX: Nausea with vomiting, unspecified: R11.2

## 2017-09-18 HISTORY — DX: Cardiac murmur, unspecified: R01.1

## 2017-09-18 HISTORY — DX: Other specified postprocedural states: Z98.890

## 2017-09-18 HISTORY — PX: ORIF ELBOW FRACTURE: SHX5031

## 2017-09-18 HISTORY — DX: Pneumonia, unspecified organism: J18.9

## 2017-09-18 LAB — CBC
HEMATOCRIT: 38.5 % — AB (ref 39.0–52.0)
Hemoglobin: 12.9 g/dL — ABNORMAL LOW (ref 13.0–17.0)
MCH: 35.3 pg — AB (ref 26.0–34.0)
MCHC: 33.5 g/dL (ref 30.0–36.0)
MCV: 105.5 fL — AB (ref 78.0–100.0)
PLATELETS: 243 10*3/uL (ref 150–400)
RBC: 3.65 MIL/uL — AB (ref 4.22–5.81)
RDW: 14.2 % (ref 11.5–15.5)
WBC: 7.3 10*3/uL (ref 4.0–10.5)

## 2017-09-18 LAB — PROTIME-INR
INR: 1.1
Prothrombin Time: 14.1 s (ref 11.4–15.2)

## 2017-09-18 LAB — BASIC METABOLIC PANEL
Anion gap: 8 (ref 5–15)
BUN: 17 mg/dL (ref 8–23)
CO2: 26 mmol/L (ref 22–32)
Calcium: 9.3 mg/dL (ref 8.9–10.3)
Chloride: 109 mmol/L (ref 98–111)
Creatinine, Ser: 0.94 mg/dL (ref 0.61–1.24)
GFR calc Af Amer: >60 mL/min (ref >60)
GLUCOSE: 88 mg/dL (ref 70–99)
POTASSIUM: 3.8 mmol/L (ref 3.5–5.1)
Sodium: 143 mmol/L (ref 135–145)

## 2017-09-18 SURGERY — OPEN REDUCTION INTERNAL FIXATION (ORIF) ELBOW/OLECRANON FRACTURE
Anesthesia: General | Site: Elbow | Laterality: Right

## 2017-09-18 MED ORDER — FENTANYL CITRATE (PF) 100 MCG/2ML IJ SOLN: 25.0000 ug | INTRAMUSCULAR | Status: DC | PRN

## 2017-09-18 MED ORDER — ROCURONIUM BROMIDE 50 MG/5ML IV SOSY
PREFILLED_SYRINGE | INTRAVENOUS | Status: AC
  Filled 2017-09-18: qty 5

## 2017-09-18 MED ORDER — GLYCOPYRROLATE PF 0.2 MG/ML IJ SOSY
PREFILLED_SYRINGE | INTRAMUSCULAR | Status: DC | PRN
  Administered 2017-09-18: .2 mg via INTRAVENOUS

## 2017-09-18 MED ORDER — ACETAMINOPHEN 325 MG PO TABS: 325.0000 mg | ORAL_TABLET | Freq: Four times a day (QID) | ORAL | Status: DC | PRN

## 2017-09-18 MED ORDER — LIDOCAINE 2% (20 MG/ML) 5 ML SYRINGE
INTRAMUSCULAR | Status: AC
  Filled 2017-09-18: qty 5

## 2017-09-18 MED ORDER — ROCURONIUM BROMIDE 10 MG/ML (PF) SYRINGE
PREFILLED_SYRINGE | INTRAVENOUS | Status: DC | PRN
  Administered 2017-09-18: 50 mg via INTRAVENOUS

## 2017-09-18 MED ORDER — PROPOFOL 10 MG/ML IV BOLUS
INTRAVENOUS | Status: AC
  Filled 2017-09-18: qty 20

## 2017-09-18 MED ORDER — METOCLOPRAMIDE HCL 5 MG/ML IJ SOLN: 5.0000 mg | Freq: Three times a day (TID) | INTRAMUSCULAR | Status: DC | PRN

## 2017-09-18 MED ORDER — POLYETHYLENE GLYCOL 3350 17 G PO PACK
17.0000 g | PACK | Freq: Every day | ORAL | Status: DC | PRN
  Administered 2017-09-20 – 2017-09-23 (×4): 17 g via ORAL
  Filled 2017-09-18 (×4): qty 1

## 2017-09-18 MED ORDER — DEXAMETHASONE SODIUM PHOSPHATE 10 MG/ML IJ SOLN
INTRAMUSCULAR | Status: DC | PRN
  Administered 2017-09-18: 10 mg via INTRAVENOUS

## 2017-09-18 MED ORDER — CEFAZOLIN SODIUM-DEXTROSE 2-3 GM-%(50ML) IV SOLR
INTRAVENOUS | Status: DC | PRN
  Administered 2017-09-18: 2 g via INTRAVENOUS

## 2017-09-18 MED ORDER — LATANOPROST 0.005 % OP SOLN
1.0000 [drp] | Freq: Every day | OPHTHALMIC | Status: DC
  Administered 2017-09-18 – 2017-09-23 (×6): 1 [drp] via OPHTHALMIC
  Filled 2017-09-18: qty 2.5

## 2017-09-18 MED ORDER — FOLIC ACID 1 MG PO TABS
2.0000 mg | ORAL_TABLET | Freq: Every day | ORAL | Status: DC
  Administered 2017-09-19 – 2017-09-24 (×6): 2 mg via ORAL
  Filled 2017-09-18 (×6): qty 2

## 2017-09-18 MED ORDER — SUGAMMADEX SODIUM 200 MG/2ML IV SOLN
INTRAVENOUS | Status: DC | PRN
  Administered 2017-09-18: 200 mg via INTRAVENOUS

## 2017-09-18 MED ORDER — EPHEDRINE 5 MG/ML INJ
INTRAVENOUS | Status: AC
  Filled 2017-09-18: qty 10

## 2017-09-18 MED ORDER — CEFAZOLIN SODIUM-DEXTROSE 2-4 GM/100ML-% IV SOLN
INTRAVENOUS | Status: AC
  Filled 2017-09-18: qty 100

## 2017-09-18 MED ORDER — FENTANYL CITRATE (PF) 100 MCG/2ML IJ SOLN
50.0000 ug | Freq: Once | INTRAMUSCULAR | Status: AC
  Administered 2017-09-18: 50 ug via INTRAVENOUS

## 2017-09-18 MED ORDER — LACTATED RINGERS IV SOLN
INTRAVENOUS | Status: DC
  Administered 2017-09-18 (×2): via INTRAVENOUS

## 2017-09-18 MED ORDER — CEFAZOLIN SODIUM-DEXTROSE 1-4 GM/50ML-% IV SOLN
1.0000 g | Freq: Four times a day (QID) | INTRAVENOUS | Status: AC
  Administered 2017-09-18 – 2017-09-19 (×3): 1 g via INTRAVENOUS
  Filled 2017-09-18 (×3): qty 50

## 2017-09-18 MED ORDER — LIDOCAINE HCL (PF) 1 % IJ SOLN
INTRAMUSCULAR | Status: AC
  Filled 2017-09-18: qty 30

## 2017-09-18 MED ORDER — METOCLOPRAMIDE HCL 10 MG PO TABS: 5.0000 mg | ORAL_TABLET | Freq: Three times a day (TID) | ORAL | Status: DC | PRN

## 2017-09-18 MED ORDER — HYDROCODONE-ACETAMINOPHEN 5-325 MG PO TABS
1.0000 | ORAL_TABLET | ORAL | Status: DC | PRN
  Administered 2017-09-18 – 2017-09-19 (×5): 1 via ORAL
  Administered 2017-09-20 (×2): 2 via ORAL
  Administered 2017-09-23 (×2): 1 via ORAL
  Filled 2017-09-18: qty 1
  Filled 2017-09-18: qty 2
  Filled 2017-09-18 (×5): qty 1
  Filled 2017-09-18: qty 2
  Filled 2017-09-18: qty 1

## 2017-09-18 MED ORDER — GLYCOPYRROLATE PF 0.2 MG/ML IJ SOSY
PREFILLED_SYRINGE | INTRAMUSCULAR | Status: AC
  Filled 2017-09-18: qty 1

## 2017-09-18 MED ORDER — ONDANSETRON HCL 4 MG/2ML IJ SOLN: 4.0000 mg | Freq: Once | INTRAMUSCULAR | Status: DC | PRN

## 2017-09-18 MED ORDER — ONDANSETRON HCL 4 MG/2ML IJ SOLN: 4.0000 mg | Freq: Four times a day (QID) | INTRAMUSCULAR | Status: DC | PRN

## 2017-09-18 MED ORDER — PROPOFOL 10 MG/ML IV BOLUS
INTRAVENOUS | Status: DC | PRN
  Administered 2017-09-18: 100 mg via INTRAVENOUS

## 2017-09-18 MED ORDER — DEXAMETHASONE SODIUM PHOSPHATE 10 MG/ML IJ SOLN
INTRAMUSCULAR | Status: AC
  Filled 2017-09-18: qty 1

## 2017-09-18 MED ORDER — ONDANSETRON HCL 4 MG PO TABS: 4.0000 mg | ORAL_TABLET | Freq: Four times a day (QID) | ORAL | Status: DC | PRN

## 2017-09-18 MED ORDER — 0.9 % SODIUM CHLORIDE (POUR BTL) OPTIME
TOPICAL | Status: DC | PRN
  Administered 2017-09-18: 1000 mL

## 2017-09-18 MED ORDER — FENTANYL CITRATE (PF) 250 MCG/5ML IJ SOLN
INTRAMUSCULAR | Status: AC
  Filled 2017-09-18: qty 5

## 2017-09-18 MED ORDER — AMLODIPINE BESYLATE 10 MG PO TABS
10.0000 mg | ORAL_TABLET | Freq: Every day | ORAL | Status: DC
  Administered 2017-09-19 – 2017-09-24 (×6): 10 mg via ORAL
  Filled 2017-09-18 (×6): qty 1

## 2017-09-18 MED ORDER — PREDNISONE 5 MG PO TABS
5.0000 mg | ORAL_TABLET | Freq: Every day | ORAL | Status: DC
  Administered 2017-09-19 – 2017-09-24 (×6): 5 mg via ORAL
  Filled 2017-09-18 (×6): qty 1

## 2017-09-18 MED ORDER — EPHEDRINE SULFATE-NACL 50-0.9 MG/10ML-% IV SOSY
PREFILLED_SYRINGE | INTRAVENOUS | Status: DC | PRN
  Administered 2017-09-18: 5 mg via INTRAVENOUS
  Administered 2017-09-18: 10 mg via INTRAVENOUS

## 2017-09-18 MED ORDER — FENTANYL CITRATE (PF) 100 MCG/2ML IJ SOLN
INTRAMUSCULAR | Status: AC
  Administered 2017-09-18: 50 ug via INTRAVENOUS
  Filled 2017-09-18: qty 2

## 2017-09-18 MED ORDER — LIDOCAINE 2% (20 MG/ML) 5 ML SYRINGE
INTRAMUSCULAR | Status: DC | PRN
  Administered 2017-09-18: 40 mg via INTRAVENOUS

## 2017-09-18 MED ORDER — FENTANYL CITRATE (PF) 250 MCG/5ML IJ SOLN
INTRAMUSCULAR | Status: DC | PRN
  Administered 2017-09-18: 50 ug via INTRAVENOUS

## 2017-09-18 MED ORDER — BUPIVACAINE HCL (PF) 0.25 % IJ SOLN
INTRAMUSCULAR | Status: AC
  Filled 2017-09-18: qty 30

## 2017-09-18 MED ORDER — METHOTREXATE 2.5 MG PO TABS
10.0000 mg | ORAL_TABLET | ORAL | Status: DC
  Administered 2017-09-20: 10 mg via ORAL
  Filled 2017-09-18: qty 4

## 2017-09-18 MED ORDER — ONDANSETRON HCL 4 MG/2ML IJ SOLN
INTRAMUSCULAR | Status: DC | PRN
  Administered 2017-09-18: 4 mg via INTRAVENOUS

## 2017-09-18 MED ORDER — ONDANSETRON HCL 4 MG/2ML IJ SOLN
INTRAMUSCULAR | Status: AC
  Filled 2017-09-18: qty 2

## 2017-09-18 MED ORDER — DOCUSATE SODIUM 100 MG PO CAPS
100.0000 mg | ORAL_CAPSULE | Freq: Two times a day (BID) | ORAL | Status: DC
  Administered 2017-09-18 – 2017-09-24 (×10): 100 mg via ORAL
  Filled 2017-09-18 (×11): qty 1

## 2017-09-18 SURGICAL SUPPLY — 55 items
BANDAGE ELASTIC 4 VELCRO ST LF (GAUZE/BANDAGES/DRESSINGS) ×3 IMPLANT
BLADE AVERAGE 25MMX9MM (BLADE) ×1
BLADE AVERAGE 25X9 (BLADE) ×2 IMPLANT
BLADE CLIPPER SURG (BLADE) ×3 IMPLANT
BNDG COHESIVE 3X5 TAN STRL LF (GAUZE/BANDAGES/DRESSINGS) ×3 IMPLANT
BNDG COHESIVE 4X5 TAN STRL (GAUZE/BANDAGES/DRESSINGS) ×3 IMPLANT
BNDG ESMARK 4X9 LF (GAUZE/BANDAGES/DRESSINGS) ×3 IMPLANT
CLEANER TIP ELECTROSURG 2X2 (MISCELLANEOUS) ×3 IMPLANT
COVER SURGICAL LIGHT HANDLE (MISCELLANEOUS) ×3 IMPLANT
CUFF TOURNIQUET SINGLE 24IN (TOURNIQUET CUFF) ×3 IMPLANT
DRAPE C-ARM 42X72 X-RAY (DRAPES) ×3 IMPLANT
DRAPE INCISE IOBAN 66X45 STRL (DRAPES) ×3 IMPLANT
DRAPE U-SHAPE 47X51 STRL (DRAPES) ×3 IMPLANT
ELECT REM PT RETURN 9FT ADLT (ELECTROSURGICAL) ×3
ELECTRODE REM PT RTRN 9FT ADLT (ELECTROSURGICAL) ×1 IMPLANT
GAUZE SPONGE 4X4 12PLY STRL (GAUZE/BANDAGES/DRESSINGS) ×3 IMPLANT
GAUZE XEROFORM 5X9 LF (GAUZE/BANDAGES/DRESSINGS) ×3 IMPLANT
GLOVE BIOGEL PI IND STRL 8 (GLOVE) ×2 IMPLANT
GLOVE BIOGEL PI INDICATOR 8 (GLOVE) ×4
GLOVE ORTHO TXT STRL SZ7.5 (GLOVE) ×6 IMPLANT
GOWN STRL REUS W/ TWL LRG LVL3 (GOWN DISPOSABLE) ×1 IMPLANT
GOWN STRL REUS W/ TWL XL LVL3 (GOWN DISPOSABLE) ×1 IMPLANT
GOWN STRL REUS W/TWL 2XL LVL3 (GOWN DISPOSABLE) ×3 IMPLANT
GOWN STRL REUS W/TWL LRG LVL3 (GOWN DISPOSABLE) ×2
GOWN STRL REUS W/TWL XL LVL3 (GOWN DISPOSABLE) ×2
GUIDEWIRE THREAD TIP 3.2X9 (WIRE) ×3 IMPLANT
KIT BASIN OR (CUSTOM PROCEDURE TRAY) ×3 IMPLANT
KIT TURNOVER KIT B (KITS) ×3 IMPLANT
MANIFOLD NEPTUNE II (INSTRUMENTS) ×3 IMPLANT
NEEDLE 1/2 CIR MAYO (NEEDLE) ×6 IMPLANT
NS IRRIG 1000ML POUR BTL (IV SOLUTION) ×3 IMPLANT
PACK ORTHO EXTREMITY (CUSTOM PROCEDURE TRAY) ×3 IMPLANT
PAD ABD 8X10 STRL (GAUZE/BANDAGES/DRESSINGS) ×3 IMPLANT
PAD ARMBOARD 7.5X6 YLW CONV (MISCELLANEOUS) ×6 IMPLANT
PAD CAST 4YDX4 CTTN HI CHSV (CAST SUPPLIES) ×1 IMPLANT
PADDING CAST COTTON 4X4 STRL (CAST SUPPLIES) ×2
PADDING CAST COTTON 6X4 STRL (CAST SUPPLIES) ×3 IMPLANT
SCREW CANN 2 THD 75 6.5X3 (Screw) ×3 IMPLANT
SLING ARM FOAM STRAP LRG (SOFTGOODS) ×3 IMPLANT
SPONGE LAP 18X18 X RAY DECT (DISPOSABLE) ×3 IMPLANT
STAPLER VISISTAT 35W (STAPLE) ×3 IMPLANT
STOCKINETTE IMPERVIOUS 9X36 MD (GAUZE/BANDAGES/DRESSINGS) ×3 IMPLANT
SUCTION FRAZIER HANDLE 10FR (MISCELLANEOUS) ×2
SUCTION TUBE FRAZIER 10FR DISP (MISCELLANEOUS) ×1 IMPLANT
SUT PROLENE 3 0 PS 2 (SUTURE) ×3 IMPLANT
SUT VIC AB 0 CT1 27 (SUTURE) ×4
SUT VIC AB 0 CT1 27XBRD ANBCTR (SUTURE) ×2 IMPLANT
SUT VIC AB 2-0 CT1 27 (SUTURE) ×6
SUT VIC AB 2-0 CT1 TAPERPNT 27 (SUTURE) ×3 IMPLANT
SYR CONTROL 10ML LL (SYRINGE) ×3 IMPLANT
TUBE CONNECTING 12'X1/4 (SUCTIONS) ×1
TUBE CONNECTING 12X1/4 (SUCTIONS) ×2 IMPLANT
UNDERPAD 30X30 (UNDERPADS AND DIAPERS) ×3 IMPLANT
WASHER 5.5MM STAINLESS STEEL (Washer) ×3 IMPLANT
YANKAUER SUCT BULB TIP NO VENT (SUCTIONS) ×3 IMPLANT

## 2017-09-18 NOTE — Anesthesia Procedure Notes (Signed)
Procedure Name: Intubation Date/Time: 09/18/2017 4:24 PM Performed by: Teressa Lower., CRNA Pre-anesthesia Checklist: Patient identified, Emergency Drugs available, Suction available and Patient being monitored Patient Re-evaluated:Patient Re-evaluated prior to induction Oxygen Delivery Method: Circle system utilized Preoxygenation: Pre-oxygenation with 100% oxygen Induction Type: IV induction Ventilation: Mask ventilation without difficulty Laryngoscope Size: Mac and 3 Grade View: Grade I Tube type: Oral Tube size: 7.5 mm Number of attempts: 1 Airway Equipment and Method: Stylet and Oral airway Placement Confirmation: ETT inserted through vocal cords under direct vision,  positive ETCO2 and breath sounds checked- equal and bilateral Secured at: 21 cm Tube secured with: Tape Dental Injury: Teeth and Oropharynx as per pre-operative assessment

## 2017-09-18 NOTE — Op Note (Signed)
Preop diagnosis: Right closed displaced olecranon fracture  Postop diagnosis: Same  Procedure: Open reduction internal fixation right closed displaced olecranon fracture  Surgeon: Rodell Perna, MD  Assistant: Benjiman Core, PA-C medically necessary and present for the entire procedure  Anesthesia: Preoperative block plus general anesthesia.  Implants: Biomet 75 mm partially-threaded cannulated screw with washer and figure-of-eight 18-gauge stainless steel wire tension band.  Tourniquet: 250 x 39 minutes  Procedure: After preoperative block induction general anesthesia lateral position on a beanbag with axillary roll proximal arm tourniquet DuraPrep to the tip of the finger stockinette extremity sheets and drapes were applied timeout procedure was completed Ancef was given prophylactically.  Midline incision was made over the fracture site extended proximally over the distal aspect of the triceps insertion site.  Fracture hematoma was evacuated curette was used fracture was reduced held with a towel clip and Steinmann pin for the 6.5 threaded screw was drilled using C-arm visualization down through the canal measured and 75 mm screw was placed with a washer.  Small drill bit was used to make drill holes in the olecranon wire was passed after bending and passed in a figure-of-eight fashion for tension band wiring tightened down securely and tightened with wire needle drivers and round handle as a fulcrum.  Figure-of-eight was placed and final spot pictures were taken showing good reduction of the olecranon fracture in anatomic position.  Tourniquet deflated irrigation with saline solution 2-0 Vicryl subtenons tissue skin staple closure postop dressing including hand dressing with posterior splint immobilizing the wrist elbow at 90 degrees with extra padding.  Patient tolerated procedure well was transferred to recovery room in stable condition.

## 2017-09-18 NOTE — Progress Notes (Signed)
Patient admitted from PACU. Alert but sleepy. Post op ORIF of right shoulder. Oxygen on 4L. Vitals stable. Telemetry applied and CCMD called. Family and patient oriented to room and surroundings.

## 2017-09-18 NOTE — Anesthesia Procedure Notes (Signed)
Anesthesia Regional Block: Supraclavicular block   Pre-Anesthetic Checklist: ,, timeout performed, Correct Patient, Correct Site, Correct Laterality, Correct Procedure, Correct Position, site marked, Risks and benefits discussed,  Surgical consent,  Pre-op evaluation,  At surgeon's request and post-op pain management  Laterality: Right  Prep: chloraprep       Needles:  Injection technique: Single-shot  Needle Type: Stimulator Needle - 80          Additional Needles:   Procedures:, nerve stimulator,,,,,,,  Narrative:  Start time: 09/18/2017 3:00 PM End time: 09/18/2017 3:10 PM Injection made incrementally with aspirations every 5 mL.  Performed by: Personally   Additional Notes: 20 cc 0.75% Ropivacaine with 0.3 mg clonidine injected easily

## 2017-09-18 NOTE — Anesthesia Postprocedure Evaluation (Signed)
Anesthesia Post Note  Patient: Cory Gonzalez  Procedure(s) Performed: OPEN REDUCTION INTERNAL FIXATION (ORIF) RIGHT ELBOW/OLECRANON FRACTURE (Right Elbow)     Patient location during evaluation: PACU Anesthesia Type: General and Regional Level of consciousness: awake and alert Pain management: pain level controlled Vital Signs Assessment: post-procedure vital signs reviewed and stable Respiratory status: spontaneous breathing, nonlabored ventilation, respiratory function stable and patient connected to nasal cannula oxygen Cardiovascular status: blood pressure returned to baseline and stable Postop Assessment: no apparent nausea or vomiting Anesthetic complications: yes Anesthetic complication details: anesthesia complicationsComments: Pt was found to have a pneumothorax in PACU by CXR. Probably caused by the supraclavicular nerve block for post op pain control. CT surgery was consulted and Dr. Cyndia Bent placed a chest tube. The patient will go to step down on nasal cannula oxygen in no respiratory distress. F/U CXR shows improved aeration of the right lung.    Last Vitals:  Vitals:   09/18/17 1955 09/18/17 2010  BP: 110/76   Pulse: (!) 57   Resp: (!) 9   Temp:  36.5 C  SpO2: 98%     Last Pain:  Vitals:   09/18/17 2010  TempSrc:   PainSc: Asleep                 Lauriann Milillo,W. EDMOND

## 2017-09-18 NOTE — Progress Notes (Signed)
Anesthesiology note: 84 year old male underwent ORIF of right olecranon fracture.  The patient has a history of paroxysmal atrial fibrillation but was in sinus rhythm prior to the surgery.  He underwent a right supraclavicular block prior to surgery for postop pain relief. He then underwent general endotracheal anesthesia. Intraoperatively he  developed atrial fibrillation with a rate between 80-100. He remained hemodynamically stable.  Following surgery he was extubated and brought to the recovery room.  He was noted to have some respiratory difficulty with oxygen saturation 85 to 90% on nonrebreather mask.  Chest x-ray revealed an approximately 80% right pneumothorax.  VS: T- 36.6 BP- 101/75 HR- 73 (atrial fibrillation) RR- 17 O2 Sat 91% on non-rebreather mask.  Impression: Right pneumothorax most likely secondary to  supraclavicular block prior to surgery.  Atrial fibrillation heart rate 75-90 with stable blood pressure.  Plan:   1. CT surgery consult for right chest tube. 2.  Cardiology notification the patient is now in atrial fibrillation 3.  Telemetry monitoring postoperatively.  Roberts Gaudy

## 2017-09-18 NOTE — Transfer of Care (Signed)
Immediate Anesthesia Transfer of Care Note  Patient: Cory Gonzalez  Procedure(s) Performed: OPEN REDUCTION INTERNAL FIXATION (ORIF) RIGHT ELBOW/OLECRANON FRACTURE (Right Elbow)  Patient Location: PACU  Anesthesia Type:GA combined with regional for post-op pain  Level of Consciousness: awake, alert  and oriented  Airway & Oxygen Therapy: Patient Spontanous Breathing and Patient connected to face mask oxygen  Post-op Assessment: Report given to RN and Post -op Vital signs reviewed and stable  Post vital signs: Reviewed and stable  Last Vitals:  Vitals Value Taken Time  BP 94/62 09/18/2017  5:23 PM  Temp    Pulse 46 09/18/2017  5:27 PM  Resp 18 09/18/2017  5:27 PM  SpO2 86 % 09/18/2017  5:27 PM  Vitals shown include unvalidated device data.  Last Pain:  Vitals:   09/18/17 1518  TempSrc:   PainSc: 0-No pain         Complications: No apparent anesthesia complications

## 2017-09-18 NOTE — Progress Notes (Signed)
Patient ID: Cory Gonzalez, male   DOB: 1929/03/18, 84 y.o.   MRN: [id number] Chest Tube Insertion Procedure Note  Indications:  Clinically significant Pneumothorax after supra-clavicular nerve block  Pre-operative Diagnosis: Pneumothorax  Post-operative Diagnosis: Pneumothorax  Procedure Details  Informed consent was obtained for the procedure, including sedation.  Risks of lung perforation, hemorrhage, arrhythmia, and adverse drug reaction were discussed.   After sterile skin prep, using standard technique, a 20 French tube was placed in the right lateral 7th rib space.  Findings: Large rush of air  Estimated Blood Loss:  Minimal         Specimens:  None              Complications:  None; patient tolerated the procedure well.         Disposition: admit to orthopedic service         Condition: stable  Attending Attestation: I performed the procedure.  CXR after procedure shows tube in good position and resolution of pneumothorax.

## 2017-09-18 NOTE — H&P (Signed)
Cory Gonzalez is an 84 y.o. male.   Chief Complaint: Right elbow pain HPI: 84 year old white male history of right olecranon fracture after fall comes in for surgical intervention.  We have received preop cardiac and medical clearances.  Past Medical History:  Diagnosis Date  . Chronic diastolic heart failure ([place])   . Heart murmur    since childhood, never has had any problems  . Hypertension   . Paroxysmal atrial fibrillation (HCC)   . Pneumonia   . PONV (postoperative nausea and vomiting)    a little nausea  . Rheumatoid arthritis [facility])     Past Surgical History:  Procedure Laterality Date  . CATARACT EXTRACTION Bilateral   . COLONOSCOPY    . PROSTATE SURGERY      Family History  Problem Relation Age of Onset  . COPD Sister   . Dementia Sister   . Aneurysm Brother    Social History:  reports that he has been smoking pipe. He has smoked for the past 50.00 years. He has quit using smokeless tobacco. He reports that he does not drink alcohol or use drugs.  Allergies: No Known Allergies  No medications prior to admission.    No results found for this or any previous visit (from the past 48 hour(s)). No results found.  Review of Systems  Constitutional: Negative.   HENT: Negative.   Respiratory: Negative.   Cardiovascular: Negative.   Gastrointestinal: Negative.   Genitourinary: Negative.   Musculoskeletal: Negative.     There were no vitals taken for this visit. Physical Exam  Constitutional: He is oriented to person, place, and time. No distress.  HENT:  Head: Normocephalic.  Eyes: Pupils are equal, round, and reactive to light.  Neck: Normal range of motion.  Cardiovascular:  Irregular rhythm.  Grade 2-0/2 systolic murmur  Respiratory: No respiratory distress.  GI: He exhibits no distension. There is no tenderness.  Musculoskeletal: He exhibits tenderness (right elbow with swelling. ).  Neurological: He is alert and oriented to person, place, and time.   Skin: Skin is warm and dry.  Psychiatric: He has a normal mood and affect.     Assessment/Plan Right olecranon fracture  We will proceed with ORIF as scheduled.  Surgical procedure along with potential rehab/recovery time discussed.  All questions answered and wishes to proceed.  Benjiman Core, PA-C 09/18/2017, 12:37 PM

## 2017-09-18 NOTE — Anesthesia Preprocedure Evaluation (Signed)
Anesthesia Evaluation  Patient identified by MRN, date of birth, ID band Patient awake    Reviewed: Allergy & Precautions, NPO status , Patient's Chart, lab work & pertinent test results  Airway Mallampati: II  TM Distance: >3 FB Neck ROM: Full    Dental  (+) Edentulous Upper, Edentulous Lower   Pulmonary Current Smoker,    breath sounds clear to auscultation       Cardiovascular hypertension,  Rhythm:Regular Rate:Normal     Neuro/Psych    GI/Hepatic   Endo/Other    Renal/GU      Musculoskeletal   Abdominal   Peds  Hematology   Anesthesia Other Findings   Reproductive/Obstetrics                             Anesthesia Physical Anesthesia Plan  ASA: III  Anesthesia Plan: General   Post-op Pain Management:  Regional for Post-op pain   Induction: Intravenous  PONV Risk Score and Plan: Ondansetron and Dexamethasone  Airway Management Planned: Oral ETT  Additional Equipment:   Intra-op Plan:   Post-operative Plan: Extubation in OR  Informed Consent: I have reviewed the patients History and Physical, chart, labs and discussed the procedure including the risks, benefits and alternatives for the proposed anesthesia with the patient or authorized representative who has indicated his/her understanding and acceptance.     Plan Discussed with: CRNA and Anesthesiologist  Anesthesia Plan Comments:         Anesthesia Quick Evaluation

## 2017-09-18 NOTE — Progress Notes (Signed)
Chest tube in place to 20cm of suction with  Air leak. Reporting nurse stated that MD is aware and okay with that.

## 2017-09-18 NOTE — Plan of Care (Signed)
?  Problem: Elimination: ?Goal: Will not experience complications related to urinary retention ?Outcome: Progressing ?  ?

## 2017-09-18 NOTE — Telephone Encounter (Signed)
Developed a fib with surgery.  Will resume xarelto as per Dr Oval Linsey.  Rate controlled will send to Dr. Oval Linsey.

## 2017-09-18 NOTE — Progress Notes (Signed)
Patient received from OR on 10 liter facemask with O2 sats ranging from the Mid 80's to high 80's. Bilateral breath sounds present with clear sounds throughout on the left and no sounds on the right. Dr Ola Spurr notified; Stat chest xray ordered. Dr Ola Spurr enroute. Patient placed on non rebreather with O2 sats climing to the low 90's.

## 2017-09-19 ENCOUNTER — Inpatient Hospital Stay (HOSPITAL_COMMUNITY): Payer: Medicare Other

## 2017-09-19 DIAGNOSIS — J939 Pneumothorax, unspecified: Secondary | ICD-10-CM

## 2017-09-19 LAB — BASIC METABOLIC PANEL
Anion gap: 8 (ref 5–15)
BUN: 19 mg/dL (ref 8–23)
CHLORIDE: 108 mmol/L (ref 98–111)
CO2: 22 mmol/L (ref 22–32)
Calcium: 8.4 mg/dL — ABNORMAL LOW (ref 8.9–10.3)
Creatinine, Ser: 0.94 mg/dL (ref 0.61–1.24)
GFR calc Af Amer: >60 mL/min (ref >60)
GFR calc non Af Amer: >60 mL/min (ref >60)
GLUCOSE: 145 mg/dL — AB (ref 70–99)
POTASSIUM: 4.1 mmol/L (ref 3.5–5.1)
Sodium: 138 mmol/L (ref 135–145)

## 2017-09-19 NOTE — Social Work (Signed)
CSW acknowledging consult for SNF placement, will continue to follow for therapy recommendations.  Alexander Mt, [place] Work [phone]

## 2017-09-19 NOTE — Progress Notes (Signed)
Anesthesiology Follow-up:  Awake and alert, breathing unlabored. No pain in right arm, some [place] discomfort from chest tube.  VS: T- 36.4 BP- 98/68 HR- 49 RR-16 O2 Sat 98% on 2L [place]  CXR: Right chest tube in good position, lung expanded without residual pneumothorax.  Labs -OK  84 year old male one day S/P ORIF of olecranon fracture, complicated by R. Pneumothorax most likely secondary to supraclavicular block. Chest tube on suction, mangement per CVTS.  Situation discussed with patient and his wife.  Roberts Gaudy

## 2017-09-19 NOTE — Plan of Care (Signed)
  Problem: Elimination: Goal: Will not experience complications related to urinary retention Outcome: Progressing   Problem: Pain Managment: Goal: General experience of comfort will improve 09/19/2017 0011 by Kayleen Memos, RN Outcome: Progressing 09/18/2017 2302 by Kayleen Memos, RN Outcome: Progressing   Problem: Safety: Goal: Ability to remain free from injury will improve Outcome: Progressing   Problem: Clinical Measurements: Goal: Ability to maintain clinical measurements within normal limits will improve Outcome: Progressing

## 2017-09-19 NOTE — Social Work (Signed)
CSW acknowledging therapy recommendations for HHPT. CSW signing off. Please consult if any additional needs arise or if this does not remain an appropriate discharge plan.   Alexander Mt, [place] Work [phone]

## 2017-09-19 NOTE — Progress Notes (Signed)
Subjective: 1 Day Post-Op Procedure(s) (LRB): OPEN REDUCTION INTERNAL FIXATION (ORIF) RIGHT ELBOW/OLECRANON FRACTURE (Right) Patient reports pain as mild at elbow, moderate pain from chest tube.    Objective: Vital signs in last 24 hours: Temp:  [96.7 F (35.9 C)-98 F (36.7 C)] 97.6 F (36.4 C) (08/22 0400) Pulse Rate:  [31-85] 52 (08/22 0400) Resp:  [9-26] 12 (08/22 0400) BP: (94-136)/(62-94) 94/82 (08/22 0400) SpO2:  [85 %-100 %] 97 % (08/22 0400) Weight:  [65.2 kg] 65.2 kg (08/21 1415)  Intake/Output from previous day: 08/21 0701 - 08/22 0700 In: 1000 [I.V.:1000] Out: 300 [Urine:280; Blood:20] Intake/Output this shift: No intake/output data recorded.  Recent Labs    09/18/17 1406  HGB 12.9*   Recent Labs    09/18/17 1406  WBC 7.3  RBC 3.65*  HCT 38.5*  PLT 243   Recent Labs    09/18/17 1406 09/19/17 0258  NA 143 138  K 3.8 4.1  CL 109 108  CO2 26 22  BUN 17 19  CREATININE 0.94 0.94  GLUCOSE 88 145*  CALCIUM 9.3 8.4*   Recent Labs    09/18/17 1406  INR 1.10    PE:    Block partial recovery, no finger extension, some finger flexion.  Chest tube slow leak.  Dg Elbow 2 Views Right  Result Date: 09/18/2017 CLINICAL DATA:  ORIF of a right elbow, olecranon fracture. EXAM: DG C-ARM 61-120 MIN; RIGHT ELBOW - 2 VIEW COMPARISON:  09/10/2017 FINDINGS: Images show placement of a screw and wire across the proximal ulna reducing the olecranon fracture into near anatomic alignment. Orthopedic hardware appears well-seated. Elbow joint is normally aligned. IMPRESSION: Well aligned olecranon fracture following ORIF. Electronically Signed   By: Lajean Manes M.D.   On: 09/18/2017 17:36   Dg Chest Port 1 View  Result Date: 09/18/2017 CLINICAL DATA:  Chest tube placement for pneumothorax EXAM: PORTABLE CHEST 1 VIEW COMPARISON:  Study obtained earlier in the day FINDINGS: There is now a chest tube on the right with resolution of pneumothorax. A small amount of soft  tissue air is present on the right. There is no longer midline shift. There is bibasilar atelectasis. Lungs elsewhere are clear. Heart is upper normal in size with pulmonary vascularity normal. There is aortic atherosclerosis. No evident bone lesions. IMPRESSION: Chest tube placed on the right with resolution of pneumothorax. Small amount of soft tissue air evident on the right. Bibasilar atelectasis is present. No consolidation. Stable cardiac silhouette. There is aortic atherosclerosis. Aortic Atherosclerosis (ICD10-I70.0). Electronically Signed   By: Lowella Grip III M.D.   On: 09/18/2017 19:22   Dg Chest Port 1 View  Result Date: 09/18/2017 CLINICAL DATA:  Pneumothorax, postoperative EXAM: PORTABLE CHEST 1 VIEW COMPARISON:  Portable exam 1811 hours compared to 11/12/2015 FINDINGS: Large RIGHT pneumothorax with complete collapse of the RIGHT lung. Mediastinal shift RIGHT to LEFT consistent with tension pneumothorax. Underlying emphysematous changes LEFT lung with LEFT basilar atelectasis and central peribronchial thickening. Normal heart size and pulmonary vascularity. Atherosclerotic calcification aorta. Bones demineralized. IMPRESSION: Large RIGHT tension pneumothorax with complete collapse of the RIGHT lung and mediastinal shift RIGHT to LEFT. Findings discussed with nurse in PACU on 09/18/2017 at 1835 hours; informed that Dr. Cyndia Bent is currently with patient to place chest tube. Electronically Signed   By: Lavonia Dana M.D.   On: 09/18/2017 18:37   Dg C-arm 1-60 Min  Result Date: 09/18/2017 CLINICAL DATA:  ORIF of a right elbow, olecranon fracture. EXAM: DG C-ARM 61-120  MIN; RIGHT ELBOW - 2 VIEW COMPARISON:  09/10/2017 FINDINGS: Images show placement of a screw and wire across the proximal ulna reducing the olecranon fracture into near anatomic alignment. Orthopedic hardware appears well-seated. Elbow joint is normally aligned. IMPRESSION: Well aligned olecranon fracture following ORIF.  Electronically Signed   By: Lajean Manes M.D.   On: 09/18/2017 17:36    Assessment/Plan: 1 Day Post-Op Procedure(s) (LRB): OPEN REDUCTION INTERNAL FIXATION (ORIF) RIGHT ELBOW/OLECRANON FRACTURE (Right) Pneumothorax post interscalene block. No dyspnea.  Pt. Changed to admit status .   Cory Gonzalez 09/19/2017, 7:35 AM

## 2017-09-19 NOTE — Evaluation (Signed)
Physical Therapy Evaluation Patient Details Name: Cory Gonzalez MRN: [id number] DOB: 11/03/1929 Today's Date: 09/19/2017   History of Present Illness  Patient is an 84 y/o male admitted after fall now s/p OPEN REDUCTION INTERNAL FIXATION (ORIF) RIGHT ELBOW/OLECRANON FRACTURE (Right) and with R chest tube due to pneumothorax following interscalene block.  PMH positive for RA, HTN, PAF and CHF.   Clinical Impression  Patient presents with decreased mobility due to weakness/pain/heaviness of L arm.  Currently minguard for safety with ambulation.  Will have wife to assist upon d/c.  Feel stable for d/c when ready and may need follow up HHPT at d/c.    Follow Up Recommendations Home health PT;Supervision/Assistance - 24 hour    Equipment Recommendations  None recommended by PT    Recommendations for Other Services       Precautions / Restrictions Precautions Precautions: Fall Required Braces or Orthoses: Sling Restrictions Weight Bearing Restrictions: No      Mobility  Bed Mobility Overal bed mobility: Needs Assistance Bed Mobility: Supine to Sit     Supine to sit: Supervision;HOB elevated     General bed mobility comments: assist for CT   Transfers Overall transfer level: Needs assistance   Transfers: Sit to/from Stand Sit to Stand: Min guard         General transfer comment: for balance/safety  Ambulation/Gait Ambulation/Gait assistance: Min assist;Min guard Gait Distance (Feet): 200 Feet Assistive device: None Gait Pattern/deviations: Step-through pattern;Step-to pattern;Decreased stride length;Wide base of support     General Gait Details: initially more broad based and with imbalance needing min A, then after increased distance ambulated with minguard A for balance/safety with improved stability and arm swing on the L  Stairs            Wheelchair Mobility    Modified Rankin (Stroke Patients Only)       Balance Overall balance assessment: Needs  assistance   Sitting balance-Leahy Scale: Good     Standing balance support: No upper extremity supported Standing balance-Leahy Scale: Fair                               Pertinent Vitals/Pain Pain Assessment: Faces Faces Pain Scale: Hurts even more Pain Location: R elbow with resting on arm in sitting Pain Descriptors / Indicators: Discomfort;Aching Pain Intervention(s): Monitored during session;Repositioned    Home Living Family/patient expects to be discharged to:: Private residence Living Arrangements: Spouse/significant other Available Help at Discharge: Family Type of Home: House Home Access: Stairs to enter Entrance Stairs-Rails: None Technical brewer of Steps: 2 Home Layout: One level Home Equipment: Cane - single point      Prior Function Level of Independence: Independent               Hand Dominance   Dominant Hand: Right    Extremity/Trunk Assessment   Upper Extremity Assessment Upper Extremity Assessment: RUE deficits/detail RUE Deficits / Details: splinted at elbow, lifts shoulder helping with other hand; able to grip R hand no difficulty    Lower Extremity Assessment Lower Extremity Assessment: Overall WFL for tasks assessed    Cervical / Trunk Assessment Cervical / Trunk Assessment: Kyphotic  Communication   Communication: HOH  Cognition Arousal/Alertness: Awake/alert Behavior During Therapy: WFL for tasks assessed/performed Overall Cognitive Status: Within Functional Limits for tasks assessed  General Comments General comments (skin integrity, edema, etc.): wife in the room and is able to assist as needed    Exercises     Assessment/Plan    PT Assessment Patient needs continued PT services  PT Problem List Decreased balance;Decreased knowledge of use of DME;Pain;Decreased range of motion;Decreased strength;Decreased mobility       PT Treatment  Interventions DME instruction;Therapeutic activities;Gait training;Therapeutic exercise;Patient/family education;Balance training;Stair training;Functional mobility training    PT Goals (Current goals can be found in the Care Plan section)  Acute Rehab PT Goals Patient Stated Goal: to return home PT Goal Formulation: With patient/family Time For Goal Achievement: 09/26/17 Potential to Achieve Goals: Good    Frequency Min 5X/week   Barriers to discharge        Co-evaluation               AM-PAC PT "6 Clicks" Daily Activity  Outcome Measure Difficulty turning over in bed (including adjusting bedclothes, sheets and blankets)?: A Little Difficulty moving from lying on back to sitting on the side of the bed? : A Little Difficulty sitting down on and standing up from a chair with arms (e.g., wheelchair, bedside commode, etc,.)?: A Little Help needed moving to and from a bed to chair (including a wheelchair)?: A Little Help needed walking in hospital room?: A Little   6 Click Score: 15    End of Session Equipment Utilized During Treatment: Gait belt;Other (comment)(sling) Activity Tolerance: Patient tolerated treatment well Patient left: in chair;with call bell/phone within reach;with family/visitor present   PT Visit Diagnosis: Other abnormalities of gait and mobility (R26.89);Pain;History of falling (Z91.81) Pain - Right/Left: Right Pain - part of body: Arm    Time: 1610-9604 PT Time Calculation (min) (ACUTE ONLY): 24 min   Charges:   PT Evaluation $PT Eval Moderate Complexity: 1 Mod PT Treatments $Gait Training: 8-22 mins        [place], [place] [phone] 09/19/2017   Reginia Naas 09/19/2017, 3:18 PM

## 2017-09-19 NOTE — Addendum Note (Signed)
Addendum  created 09/19/17 1025 by Roberts Gaudy, MD   Sign clinical note

## 2017-09-19 NOTE — Progress Notes (Addendum)
[address]       [place] [address]             [phone]      1 Day Post-Op Procedure(s) (LRB): OPEN REDUCTION INTERNAL FIXATION (ORIF) RIGHT ELBOW/OLECRANON FRACTURE (Right) Subjective: Denies SOB, sats good on 2 liters [place]  Objective: Vital signs in last 24 hours: Temp:  [96.7 F (35.9 C)-98 F (36.7 C)] 97.6 F (36.4 C) (08/22 0832) Pulse Rate:  [31-85] 49 (08/22 0832) Cardiac Rhythm: Normal sinus rhythm (08/22 0815) Resp:  [9-26] 16 (08/22 0832) BP: (94-136)/(62-94) 96/68 (08/22 0832) SpO2:  [85 %-100 %] 98 % (08/22 0832) Weight:  [65.2 kg] 65.2 kg (08/21 1415)  Hemodynamic parameters for last 24 hours:    Intake/Output from previous day: 08/21 0701 - 08/22 0700 In: 1000 [I.V.:1000] Out: 300 [Urine:280; Blood:20] Intake/Output this shift: Total I/O In: -  Out: 3 [Chest Tube:3]  General appearance: alert, cooperative and no distress Heart: regular rate and rhythm and soft systolic murmur Lungs: clear anteriorly, did not sit up d/t ortho surgery limitations Abdomen: soft, nontender Extremities: PAS in place, no calf tenderness  Lab Results: Recent Labs    09/18/17 1406  WBC 7.3  HGB 12.9*  HCT 38.5*  PLT 243   BMET:  Recent Labs    09/18/17 1406 09/19/17 0258  NA 143 138  K 3.8 4.1  CL 109 108  CO2 26 22  GLUCOSE 88 145*  BUN 17 19  CREATININE 0.94 0.94  CALCIUM 9.3 8.4*    PT/INR:  Recent Labs    09/18/17 1406  LABPROT 14.1  INR 1.10   ABG No results found for: PHART, HCO3, TCO2, ACIDBASEDEF, O2SAT CBG (last 3)  No results for input(s): GLUCAP in the last 72 hours.  Meds Scheduled Meds: . amLODipine  10 mg Oral Daily  . docusate sodium  100 mg Oral BID  . folic acid  2 mg Oral Daily  . latanoprost  1 drop Both Eyes QHS  . [START ON 09/20/2017] methotrexate  10 mg Oral Q Fri  . predniSONE  5 mg Oral Q breakfast   Continuous Infusions: PRN Meds:.acetaminophen, HYDROcodone-acetaminophen, metoCLOPramide  **OR** metoCLOPramide (REGLAN) injection, ondansetron **OR** ondansetron (ZOFRAN) IV, polyethylene glycol  Xrays Dg Elbow 2 Views Right  Result Date: 09/18/2017 CLINICAL DATA:  ORIF of a right elbow, olecranon fracture. EXAM: DG C-ARM 61-120 MIN; RIGHT ELBOW - 2 VIEW COMPARISON:  09/10/2017 FINDINGS: Images show placement of a screw and wire across the proximal ulna reducing the olecranon fracture into near anatomic alignment. Orthopedic hardware appears well-seated. Elbow joint is normally aligned. IMPRESSION: Well aligned olecranon fracture following ORIF. Electronically Signed   By: Lajean Manes M.D.   On: 09/18/2017 17:36   Dg Chest Port 1 View  Result Date: 09/19/2017 CLINICAL DATA:  Pneumothorax.  Chest tube. EXAM: PORTABLE CHEST 1 VIEW COMPARISON:  09/18/2017. FINDINGS: Right chest tube in stable position. No definite pneumothorax noted. Mild bibasilar atelectasis again noted. No pleural effusion. Stable cardiomegaly. Mild right chest wall subcutaneous emphysema again noted. IMPRESSION: 1. Right chest tube in stable position. No definite pneumothorax noted. Chest appears stable from prior exam. 2.  Persistent mild bibasilar atelectasis. 3.  Stable cardiomegaly.  No pulmonary venous congestion Electronically Signed   By: Marcello Moores  Register   On: 09/19/2017 07:58   Dg Chest Port 1 View  Result Date: 09/18/2017 CLINICAL DATA:  Chest tube placement for pneumothorax EXAM: PORTABLE CHEST 1 VIEW COMPARISON:  Study obtained earlier in the day FINDINGS: There is now a chest tube on the right with resolution of pneumothorax. A small amount of soft tissue air is present on the right. There is no longer midline shift. There is bibasilar atelectasis. Lungs elsewhere are clear. Heart is upper normal in size with pulmonary vascularity normal. There is aortic atherosclerosis. No evident bone lesions. IMPRESSION: Chest tube placed on the right with resolution of pneumothorax. Small amount of soft tissue air  evident on the right. Bibasilar atelectasis is present. No consolidation. Stable cardiac silhouette. There is aortic atherosclerosis. Aortic Atherosclerosis (ICD10-I70.0). Electronically Signed   By: Lowella Grip III M.D.   On: 09/18/2017 19:22   Dg Chest Port 1 View  Result Date: 09/18/2017 CLINICAL DATA:  Pneumothorax, postoperative EXAM: PORTABLE CHEST 1 VIEW COMPARISON:  Portable exam 1811 hours compared to 11/12/2015 FINDINGS: Large RIGHT pneumothorax with complete collapse of the RIGHT lung. Mediastinal shift RIGHT to LEFT consistent with tension pneumothorax. Underlying emphysematous changes LEFT lung with LEFT basilar atelectasis and central peribronchial thickening. Normal heart size and pulmonary vascularity. Atherosclerotic calcification aorta. Bones demineralized. IMPRESSION: Large RIGHT tension pneumothorax with complete collapse of the RIGHT lung and mediastinal shift RIGHT to LEFT. Findings discussed with nurse in PACU on 09/18/2017 at 1835 hours; informed that Dr. Cyndia Bent is currently with patient to place chest tube. Electronically Signed   By: Lavonia Dana M.D.   On: 09/18/2017 18:37   Dg C-arm 1-60 Min  Result Date: 09/18/2017 CLINICAL DATA:  ORIF of a right elbow, olecranon fracture. EXAM: DG C-ARM 61-120 MIN; RIGHT ELBOW - 2 VIEW COMPARISON:  09/10/2017 FINDINGS: Images show placement of a screw and wire across the proximal ulna reducing the olecranon fracture into near anatomic alignment. Orthopedic hardware appears well-seated. Elbow joint is normally aligned. IMPRESSION: Well aligned olecranon fracture following ORIF. Electronically Signed   By: Lajean Manes M.D.   On: 09/18/2017 17:36    Assessment/Plan: S/P Procedure(s) (LRB): OPEN REDUCTION INTERNAL FIXATION (ORIF) RIGHT ELBOW/OLECRANON FRACTURE (Right)  1 stable clinically, SBP 90's to low 100's, HR runs in 40-50's with some afib- currently not on tele ?- will order 2 sats 98 percent on RA 3 Chest tube- small air  leak, no drainage- cont to suction for now, hopefully H2O seal by tomorrow     LOS: 1 day    John Giovanni 09/19/2017 Pager [phone]   Chart reviewed, patient examined, agree with above. CXR looks ok with no ptx. Small air leak is present. Continue to suction today and try water seal tomorrow.

## 2017-09-20 ENCOUNTER — Inpatient Hospital Stay (HOSPITAL_COMMUNITY): Payer: Medicare Other

## 2017-09-20 LAB — BASIC METABOLIC PANEL WITH GFR
Anion gap: 6 (ref 5–15)
BUN: 27 mg/dL — ABNORMAL HIGH (ref 8–23)
CO2: 25 mmol/L (ref 22–32)
Calcium: 8.6 mg/dL — ABNORMAL LOW (ref 8.9–10.3)
Chloride: 105 mmol/L (ref 98–111)
Creatinine, Ser: 1.11 mg/dL (ref 0.61–1.24)
GFR calc Af Amer: >60 mL/min
GFR calc non Af Amer: 58 mL/min — ABNORMAL LOW
Glucose, Bld: 106 mg/dL — ABNORMAL HIGH (ref 70–99)
Potassium: 3.8 mmol/L (ref 3.5–5.1)
Sodium: 136 mmol/L (ref 135–145)

## 2017-09-20 MED ORDER — LIDOCAINE HCL (CARDIAC) PF 100 MG/5ML IV SOSY
PREFILLED_SYRINGE | INTRAVENOUS | Status: AC
  Administered 2017-09-20: 09:00:00
  Filled 2017-09-20: qty 5

## 2017-09-20 MED ORDER — LIDOCAINE HCL (PF) 1 % IJ SOLN
INTRAMUSCULAR | Status: AC
  Administered 2017-09-20: 09:00:00
  Filled 2017-09-20: qty 10

## 2017-09-20 MED ORDER — LIDOCAINE HCL (PF) 1 % IJ SOLN
INTRAMUSCULAR | Status: AC
  Administered 2017-09-20: 08:00:00
  Filled 2017-09-20: qty 10

## 2017-09-20 NOTE — Progress Notes (Signed)
Patient ID: Cory Gonzalez, male   DOB: 13-Sep-1929, 84 y.o.   MRN: [id number] CXR after new chest tube shows tube in good position and resolution on pneumothorax. Small intermittent air leak present.  Will keep to suction today and repeat CXR in am. If lung remains expanded may be able to put to water seal tomorrow.  Discussed status and plans with his wife.

## 2017-09-20 NOTE — Addendum Note (Signed)
Addendum  created 09/20/17 1927 by Roberts Gaudy, MD   Sign clinical note

## 2017-09-20 NOTE — Progress Notes (Signed)
Physical Therapy Treatment Patient Details Name: Cory Gonzalez MRN: [id number] DOB: May 16, 1929 Today's Date: 09/20/2017    History of Present Illness Patient is an 84 y/o male admitted after fall now s/p OPEN REDUCTION INTERNAL FIXATION (ORIF) RIGHT ELBOW/OLECRANON FRACTURE (Right) and with R chest tube due to pneumothorax following interscalene block.  PMH positive for RA, HTN, PAF and CHF.     PT Comments    Mobilized well during gait.  Initial instabilities lessened with time up allowing pt to scan and change speed without overt deviation.   Follow Up Recommendations  Home health PT;Supervision/Assistance - 24 hour     Equipment Recommendations  None recommended by PT    Recommendations for Other Services       Precautions / Restrictions Precautions Precautions: Fall;Other (comment) Precaution Comments: post op splint to R UE, chest tube Required Braces or Orthoses: Sling Restrictions Weight Bearing Restrictions: Yes RUE Weight Bearing: Non weight bearing Other Position/Activity Restrictions: no weightbearing orders, but cautiously NWB    Mobility  Bed Mobility               General bed mobility comments: still OOB in the recliner on arrival  Transfers Overall transfer level: Needs assistance Equipment used: None Transfers: Sit to/from Stand Sit to Stand: Min guard         General transfer comment: for safety  Ambulation/Gait Ambulation/Gait assistance: Min assist;Min guard Gait Distance (Feet): 350 Feet Assistive device: None Gait Pattern/deviations: Step-through pattern Gait velocity: slower with ability to speed up slightly to cuing. Gait velocity interpretation: <1.8 ft/sec, indicate of risk for recurrent falls General Gait Details: Initially wider BOS, mild wandering/staggering and a couple bump off the right walls/rails, but as he progressed, be became more steady and was able to scan and vary speeds without overt deviation.   Stairs              Wheelchair Mobility    Modified Rankin (Stroke Patients Only)       Balance Overall balance assessment: Needs assistance Sitting-balance support: No upper extremity supported;Feet supported Sitting balance-Leahy Scale: Good     Standing balance support: No upper extremity supported;During functional activity Standing balance-Leahy Scale: Fair Standing balance comment: min guard for safety                            Cognition Arousal/Alertness: Awake/alert Behavior During Therapy: WFL for tasks assessed/performed Overall Cognitive Status: Within Functional Limits for tasks assessed                                        Exercises      General Comments General comments (skin integrity, edema, etc.): Noted HR variable (in afib) and tachy to mid 120's during gait.  Pt was assyptomatic      Pertinent Vitals/Pain Pain Assessment: Faces Faces Pain Scale: Hurts even more Pain Location: R UE/ chest tube area  Pain Descriptors / Indicators: Discomfort;Aching Pain Intervention(s): Limited activity within patient's tolerance    Home Living Family/patient expects to be discharged to:: Private residence Living Arrangements: Spouse/significant other Available Help at Discharge: Family Type of Home: House Home Access: Stairs to enter Entrance Stairs-Rails: None Home Layout: One level Home Equipment: Cane - single point      Prior Function Level of Independence: Independent      Comments: independent ADLs, IADLs, driving  PT Goals (current goals can now be found in the care plan section) Acute Rehab PT Goals Patient Stated Goal: to return home PT Goal Formulation: With patient/family Time For Goal Achievement: 09/26/17 Potential to Achieve Goals: Good Progress towards PT goals: Progressing toward goals    Frequency    Min 5X/week      PT Plan Current plan remains appropriate    Co-evaluation              AM-PAC PT  "6 Clicks" Daily Activity  Outcome Measure  Difficulty turning over in bed (including adjusting bedclothes, sheets and blankets)?: A Little Difficulty moving from lying on back to sitting on the side of the bed? : A Little Difficulty sitting down on and standing up from a chair with arms (e.g., wheelchair, bedside commode, etc,.)?: A Little Help needed moving to and from a bed to chair (including a wheelchair)?: A Little Help needed walking in hospital room?: A Little Help needed climbing 3-5 steps with a railing? : A Little 6 Click Score: 18    End of Session   Activity Tolerance: Patient tolerated treatment well Patient left: in chair;with call bell/phone within reach;with family/visitor present Nurse Communication: Mobility status PT Visit Diagnosis: Other abnormalities of gait and mobility (R26.89);Pain;History of falling (Z91.81) Pain - Right/Left: Right Pain - part of body: Arm     Time: 9450-3888 PT Time Calculation (min) (ACUTE ONLY): 20 min  Charges:  $Gait Training: 8-22 mins                     09/20/2017  Donnella Sham, PT [phone]  (pager)   Tessie Fass Mitch Arquette 09/20/2017, 6:01 PM

## 2017-09-20 NOTE — Progress Notes (Signed)
Wrong time

## 2017-09-20 NOTE — Progress Notes (Signed)
Anesthesiology Follow-up:  Events of today noted. Patient resting with unlabored respirations, O2 Sat 97% on RA. CXR shows chest tube in good postion with R. Lung expanded and no pneumothorax. Condition discussed with patient and family.  Roberts Gaudy

## 2017-09-20 NOTE — Evaluation (Signed)
Occupational Therapy Evaluation Patient Details Name: Cory Gonzalez MRN: [id number] DOB: 08-03-1929 Today's Date: 09/20/2017    History of Present Illness Patient is an 84 y/o male admitted after fall now s/p OPEN REDUCTION INTERNAL FIXATION (ORIF) RIGHT ELBOW/OLECRANON FRACTURE (Right) and with R chest tube due to pneumothorax following interscalene block.  PMH positive for RA, HTN, PAF and CHF.    Clinical Impression   PTA patient independent and driving.  He currently requires min assist for UB ADL seated, min assist for LB ADL, min guard for toilet transfers, min assist for toileting, and setup for eating/grooming.  He is currently limited by pain (R chest tube area and R UE), impaired standing balance, decreased activity tolerance, and decreased safety awareness.  Educated patient and spouse on sling wear schedule, precautions, safety, ADL compensatory techniques, exercises, and recommendations.  Based on performance today, patient will benefit from continued OT services while admitted in order to maximize safety and independence prior to dc home, but do not anticipate need for further services after dc. Will continue to follow.      Follow Up Recommendations  No OT follow up;Follow surgeon's recommendation for DC plan and follow-up therapies;Supervision/Assistance - 24 hour    Equipment Recommendations  None recommended by OT    Recommendations for Other Services       Precautions / Restrictions Precautions Precautions: Fall;Other (comment) Precaution Comments: post op splint to R UE, chest tube Required Braces or Orthoses: Sling Restrictions Weight Bearing Restrictions: Yes RUE Weight Bearing: Non weight bearing Other Position/Activity Restrictions: no weightbearing orders, but cautiously NWB      Mobility Bed Mobility               General bed mobility comments: seated EOB upon entry   Transfers Overall transfer level: Needs assistance Equipment used:  None Transfers: Sit to/from Stand Sit to Stand: Min guard         General transfer comment: for balance and safety    Balance Overall balance assessment: Needs assistance Sitting-balance support: No upper extremity supported;Feet supported Sitting balance-Leahy Scale: Good     Standing balance support: No upper extremity supported;During functional activity Standing balance-Leahy Scale: Fair Standing balance comment: min guard for safety                           ADL either performed or assessed with clinical judgement   ADL Overall ADL's : Needs assistance/impaired Eating/Feeding: Set up;Sitting   Grooming: Set up;Sitting   Upper Body Bathing: Minimal assistance;Sitting   Lower Body Bathing: Minimal assistance;Sit to/from stand   Upper Body Dressing : Minimal assistance;Sitting   Lower Body Dressing: Minimal assistance;Sit to/from stand   Toilet Transfer: Min guard;Ambulation(simulated to recliner ) Toilet Transfer Details (indicate cue type and reason): min guard for safety, patient swaying posteriorly  Toileting- Clothing Manipulation and Hygiene: Minimal assistance;Sit to/from stand       Functional mobility during ADLs: Min guard General ADL Comments: pt frustrated with "all these tube and wires", decreased safety awareness with mobility and noted posterior swaying once standing requiring min gaurd assist     Vision Baseline Vision/History: Wears glasses Wears Glasses: At all times Patient Visual Report: No change from baseline Vision Assessment?: No apparent visual deficits     Perception     Praxis      Pertinent Vitals/Pain Pain Assessment: Faces Faces Pain Scale: Hurts even more Pain Location: R UE/ chest tube area  Pain Descriptors /  Indicators: Discomfort;Aching Pain Intervention(s): Limited activity within patient's tolerance;Repositioned     Hand Dominance Right   Extremity/Trunk Assessment Upper Extremity Assessment Upper  Extremity Assessment: RUE deficits/detail RUE Deficits / Details: post op splint to R UE; able to lift UE at shoulder and wiggle fingers  RUE: Unable to fully assess due to immobilization RUE Sensation: WNL RUE Coordination: decreased fine motor;decreased gross motor   Lower Extremity Assessment Lower Extremity Assessment: Defer to PT evaluation   Cervical / Trunk Assessment Cervical / Trunk Assessment: Kyphotic   Communication Communication Communication: HOH   Cognition Arousal/Alertness: Awake/alert Behavior During Therapy: WFL for tasks assessed/performed Overall Cognitive Status: Within Functional Limits for tasks assessed                                     General Comments  spouse present and supportive; educated on sling use, simple ROM exercises for shoulder and fingers to maintain mobility     Exercises     Shoulder Instructions      Home Living Family/patient expects to be discharged to:: Private residence Living Arrangements: Spouse/significant other Available Help at Discharge: Family Type of Home: House Home Access: Stairs to enter Technical brewer of Steps: 2 Entrance Stairs-Rails: None Home Layout: One level     Bathroom Shower/Tub: Occupational psychologist: Standard     Home Equipment: Cane - single point          Prior Functioning/Environment Level of Independence: Independent        Comments: independent ADLs, IADLs, driving         OT Problem List: Decreased strength;Decreased activity tolerance;Impaired balance (sitting and/or standing);Decreased safety awareness;Decreased knowledge of precautions;Pain;Impaired UE functional use      OT Treatment/Interventions: Self-care/ADL training;Therapeutic exercise;Balance training;Patient/family education;Therapeutic activities;DME and/or AE instruction    OT Goals(Current goals can be found in the care plan section) Acute Rehab OT Goals Patient Stated Goal: to  return home OT Goal Formulation: With patient/family Time For Goal Achievement: 10/04/17 Potential to Achieve Goals: Good  OT Frequency: Min 2X/week   Barriers to D/C:            Co-evaluation              AM-PAC PT "6 Clicks" Daily Activity     Outcome Measure Help from another person eating meals?: A Little Help from another person taking care of personal grooming?: A Little Help from another person toileting, which includes using toliet, bedpan, or urinal?: A Little Help from another person bathing (including washing, rinsing, drying)?: A Little Help from another person to put on and taking off regular upper body clothing?: A Little Help from another person to put on and taking off regular lower body clothing?: A Little 6 Click Score: 18   End of Session Equipment Utilized During Treatment: Other (comment);Oxygen(sling ) Nurse Communication: Mobility status;Other (comment)(pt needs)  Activity Tolerance: Patient tolerated treatment well Patient left: in chair;with call bell/phone within reach;with nursing/sitter in room  OT Visit Diagnosis: Unsteadiness on feet (R26.81);Muscle weakness (generalized) (M62.81);Pain Pain - Right/Left: Right Pain - part of body: Arm(chest)                Time: 8546-2703 OT Time Calculation (min): 16 min Charges:  OT General Charges $OT Visit: 1 Visit OT Evaluation $OT Eval Moderate Complexity: 1 [address] OTR/L  Pager [place]  09/20/2017, 4:50 PM

## 2017-09-20 NOTE — Progress Notes (Signed)
   Subjective: 2 Days Post-Op Procedure(s) (LRB): OPEN REDUCTION INTERNAL FIXATION (ORIF) RIGHT ELBOW/OLECRANON FRACTURE (Right) Patient reports pain as moderate.  Chest tube got partially pulled out with transfers . pneumo 50 percent recurrence. New CT placed. Lung inflated now.    Objective: Vital signs in last 24 hours: Temp:  [97.5 F (36.4 C)-98.7 F (37.1 C)] 97.6 F (36.4 C) (08/23 0845) Pulse Rate:  [48-56] 48 (08/23 0845) Resp:  [14-18] 18 (08/23 0845) BP: (106-157)/(61-78) 149/78 (08/23 0845) SpO2:  [92 %-100 %] 100 % (08/23 0845)  Intake/Output from previous day: 08/22 0701 - 08/23 0700 In: 480 [P.O.:480] Out: 1113 [Urine:1050; Chest Tube:63] Intake/Output this shift: No intake/output data recorded.  Recent Labs    09/18/17 1406  HGB 12.9*   Recent Labs    09/18/17 1406  WBC 7.3  RBC 3.65*  HCT 38.5*  PLT 243   Recent Labs    09/19/17 0258 09/20/17 0259  NA 138 136  K 4.1 3.8  CL 108 105  CO2 22 25  BUN 19 27*  CREATININE 0.94 1.11  GLUCOSE 145* 106*  CALCIUM 8.4* 8.6*   Recent Labs    09/18/17 1406  INR 1.10    Neurologically intact Dg Chest Port 1 View  Result Date: 09/20/2017 CLINICAL DATA:  Status post right chest tube placement today for pneumothorax. EXAM: PORTABLE CHEST 1 VIEW COMPARISON:  Single-view of the chest earlier today. FINDINGS: A new right chest tube is in place with the tip projecting in the apex across the midline over the trachea. Extensive subcutaneous emphysema is seen about the right chest. Right pneumothorax seen on the prior examination has nearly completely resolved. The left lung is expanded and clear. The lungs are emphysematous. Heart size is normal. Aortic atherosclerosis is noted. IMPRESSION: Near complete resolution of right pneumothorax after chest tube placement. Note is again made that the tip of the tube projects across the midline in the apex. Emphysema. Atherosclerosis. Electronically Signed   By: Inge Rise M.D.   On: 09/20/2017 08:52   Dg Chest Port 1 View  Result Date: 09/20/2017 CLINICAL DATA:  Right chest tube. EXAM: PORTABLE CHEST 1 VIEW COMPARISON:  Chest x-ray 09/19/2017. Chest x-rays 09/18/2017. Chest x-ray 11/12/2015, 12/02/2014. FINDINGS: Right chest tube noted with tip over the right mid chest. Recurrence of right-sided pneumothorax is noted. Pneumothorax is moderate-sized. Diffuse right chest wall subcutaneous emphysema. Mediastinum and hilar structures normal. Heart size normal. Bibasilar atelectasis again noted. Right apical pleural thickening again noted. No pleural effusion. IMPRESSION: 1. Right chest tube noted over the right mid chest. Recurrence of right-sided pneumothorax. Pneumothorax is moderate in size. Diffuse right chest wall subcutaneous emphysema. 2.  Bibasilar atelectasis again noted. Critical Value/emergent results were called by telephone at the time of interpretation on 09/20/2017 at 6:31 am to nurse Roselyn Reef, who verbally acknowledged these results. Electronically Signed   By: Marcello Moores  Register   On: 09/20/2017 06:39    Assessment/Plan: 2 Days Post-Op Procedure(s) (LRB): OPEN REDUCTION INTERNAL FIXATION (ORIF) RIGHT ELBOW/OLECRANON FRACTURE (Right) Plan:   Here until chest tube pulled and lung stays inflated. ( early vs mid next week ).   Cory Gonzalez 09/20/2017, 10:40 AM

## 2017-09-20 NOTE — CV Procedure (Signed)
Chest Tube Insertion Procedure Note  Indications:  Clinically significant Pneumothorax  Pre-operative Diagnosis: Pneumothorax due to chest tube getting pulled half way out.  Post-operative Diagnosis: same  Procedure Details  Informed consent was obtained for the procedure, including sedation.  Risks of lung perforation, hemorrhage, arrhythmia, and adverse drug reaction were discussed.   After sterile skin prep, using standard technique, a 20 French tube was placed in the right lateral 6th rib space.  Findings: None  Estimated Blood Loss:  Minimal         Specimens:  None              Complications:  None; patient tolerated the procedure well.         Disposition: procedure performed in his room         Condition: stable  Attending Attestation: I performed the procedure.

## 2017-09-21 ENCOUNTER — Inpatient Hospital Stay (HOSPITAL_COMMUNITY): Payer: Medicare Other

## 2017-09-21 LAB — BASIC METABOLIC PANEL
ANION GAP: 5 (ref 5–15)
BUN: 17 mg/dL (ref 8–23)
CO2: 27 mmol/L (ref 22–32)
Calcium: 8.5 mg/dL — ABNORMAL LOW (ref 8.9–10.3)
Chloride: 106 mmol/L (ref 98–111)
Creatinine, Ser: 0.98 mg/dL (ref 0.61–1.24)
GFR calc Af Amer: >60 mL/min (ref >60)
Glucose, Bld: 94 mg/dL (ref 70–99)
Potassium: 3.6 mmol/L (ref 3.5–5.1)
SODIUM: 138 mmol/L (ref 135–145)

## 2017-09-21 NOTE — Plan of Care (Signed)

## 2017-09-21 NOTE — Progress Notes (Signed)
Patient ID: Cory Gonzalez, male   DOB: 05/05/1929, 84 y.o.   MRN: [id number] Sittingu p and eating breakfast this am.  Comfortable overall with stable vitals.  Angry about his situation.  His right elbow is splinted and that is intact.  His chest tube is to suction - CT surgery following.  Can discharge to home one PTX resolved.

## 2017-09-21 NOTE — Progress Notes (Signed)
Pt c/o chest pain describing it as intermittent chest tightness and feeling sob.  Vitals taken and are stable. Zollie Beckers MD notified. States since vitals are normal would like him to be continued to be monitored and no ECG is needed at this time. Will continue to monitor.

## 2017-09-21 NOTE — Progress Notes (Signed)
      [address]       [place] [address]             [phone]      C/o pain from CT this AM  BP (!) 151/87 (BP Location: Left Arm)   Pulse 70   Temp 98.2 F (36.8 C) (Oral)   Resp (!) 27   Ht 5' 5.5" (1.664 m)   Wt 65.2 kg   SpO2 97%   BMI 23.57 kg/m   Lungs clear with equal BS + air leak   PORTABLE CHEST 1 VIEW  COMPARISON:  09/20/2017  FINDINGS: Right chest tube is repositioned. Tiny amount of pleural space air at the apex. Subcutaneous air as seen previously. Mild bibasilar atelectasis again demonstrated.  IMPRESSION: Right chest tube repositioned. Tiny amount of pleural air at the apex.   Electronically Signed   By: Nelson Chimes M.D.   On: 09/21/2017 07:06  Moderate air leak, lung well expanded with CT in place Keep on suction for now  [place]. Roxan Hockey, MD [facility] [phone]

## 2017-09-22 ENCOUNTER — Inpatient Hospital Stay (HOSPITAL_COMMUNITY): Payer: Medicare Other

## 2017-09-22 LAB — MRSA PCR SCREENING: MRSA by PCR: NEGATIVE

## 2017-09-22 NOTE — Addendum Note (Signed)
Addendum  created 09/22/17 1522 by Roberts Gaudy, MD   Sign clinical note

## 2017-09-22 NOTE — Progress Notes (Signed)
      [address]       [place] [address]             [phone]      Feels better this am  BP 127/83 (BP Location: Left Arm)   Pulse (!) 59   Temp 97.6 F (36.4 C) (Oral)   Resp 19   Ht 5' 5.5" (1.664 m)   Wt 65.2 kg   SpO2 100%   BMI 23.57 kg/m    BS equal bilaterally No air leak  PORTABLE CHEST 1 VIEW  COMPARISON:  Radiograph of September 21, 2017.  FINDINGS: The heart size and mediastinal contours are within normal limits. Stable position of right-sided chest tube is noted. Stable minimal right apical pneumothorax is noted. Large amount of subcutaneous emphysema is seen over the right chest wall. Mild left basilar subsegmental atelectasis or scarring is noted. The visualized skeletal structures are unremarkable.  IMPRESSION: Stable position of right-sided chest tube with stable minimal right apical pneumothorax. Large amount of subcutaneous emphysema is seen over the right chest wall.   Electronically Signed   By: Marijo Conception, M.D.   On: 09/22/2017 09:38  Will place CT to water seal today Repeat CXR in AM  Shyann Hefner C. Roxan Hockey, MD [facility] [phone]

## 2017-09-22 NOTE — Progress Notes (Signed)
Anesthesiology Follow-up:  Awake and alert, sitting in chair in good spirits. Chest tube now at water seal.   CXR shows minimal R. apical pneumothorax with extensive R. chest wall subcutaneous  emphysema.   VS: T- 36.4 BP- 12783 HR- 75 (afib) RR- 19 O2 Sat 99% on RA  He is in Afib, Xarelto has not been restarted yet. Hopefully chest tube can be removed in next 1-2 days.  Roberts Gaudy

## 2017-09-22 NOTE — Progress Notes (Signed)
Patient ID: Cory Gonzalez, male   DOB: 06/30/1929, 84 y.o.   MRN: [id number] Chest tube was put on water-seal this am by CT surgery.  Patient's family at the bedside.  He seems to be doing better overall.  Denies chest pain this am.  Vitals stable.  Right arm stable.  Hopefully discharge to home the next 1-2 days if continues to improve.

## 2017-09-23 ENCOUNTER — Encounter (HOSPITAL_COMMUNITY): Payer: Self-pay | Admitting: Orthopaedic Surgery

## 2017-09-23 ENCOUNTER — Inpatient Hospital Stay (HOSPITAL_COMMUNITY): Payer: Medicare Other

## 2017-09-23 NOTE — Progress Notes (Signed)
Anesthesiology Note:  Awake and alert, sitting in chair. In good spirits, appears comfortable.  VS: T-36.4 BP-130/86RR-19 O2 Sat 95% on RA  CXR stable on water  seal, no air leak.   Chest tube  Removed. Plan to discharge home tomorrow if CXR shows no recurrence of pneumothorax.  Roberts Gaudy

## 2017-09-23 NOTE — Progress Notes (Signed)
5 Days Post-Op Procedure(s) (LRB): OPEN REDUCTION INTERNAL FIXATION (ORIF) RIGHT ELBOW/OLECRANON FRACTURE (Right) Subjective: Only complaint is of some intermittent right sided chest pain.  Objective: Vital signs in last 24 hours: Temp:  [97.4 F (36.3 C)-98.7 F (37.1 C)] 98.7 F (37.1 C) (08/26 1200) Pulse Rate:  [68-95] 95 (08/26 1200) Cardiac Rhythm: Heart block (08/26 0702) Resp:  [15-121] 15 (08/26 1200) BP: (104-148)/(71-94) 128/90 (08/26 1200) SpO2:  [92 %-97 %] 92 % (08/26 1200)  Hemodynamic parameters for last 24 hours:    Intake/Output from previous day: 08/25 0701 - 08/26 0700 In: 960 [P.O.:960] Out: 2 [Chest Tube:2] Intake/Output this shift: Total I/O In: 480 [P.O.:480] Out: -   General appearance: alert and cooperative Heart: regular rate and rhythm, S1, S2 normal, no murmur, click, rub or gallop Lungs: clear to auscultation bilaterally chest tube with no air leak or tidaling.  Lab Results: No results for input(s): WBC, HGB, HCT, PLT in the last 72 hours. BMET:  Recent Labs    09/21/17 0257  NA 138  K 3.6  CL 106  CO2 27  GLUCOSE 94  BUN 17  CREATININE 0.98  CALCIUM 8.5*    PT/INR: No results for input(s): LABPROT, INR in the last 72 hours. ABG No results found for: PHART, HCO3, TCO2, ACIDBASEDEF, O2SAT CBG (last 3)  No results for input(s): GLUCAP in the last 72 hours.   CXR: tiny right apical ptx vs normal pleural line at apex.  Assessment/Plan: S/P Procedure(s) (LRB): OPEN REDUCTION INTERNAL FIXATION (ORIF) RIGHT ELBOW/OLECRANON FRACTURE (Right)  CXR looks fine and no air leak seen. Will remove chest tube and repeat CXR afterward. Then plan CXR in am and if no change he could go home tomorrow. Discussed with patient and wife.   LOS: 5 days    [name] 09/23/2017

## 2017-09-23 NOTE — Progress Notes (Signed)
Physical Therapy Treatment Patient Details Name: Cory Gonzalez MRN: [id number] DOB: 1929-02-19 Today's Date: 09/23/2017    History of Present Illness Patient is an 84 y/o male admitted after fall now s/p OPEN REDUCTION INTERNAL FIXATION (ORIF) RIGHT ELBOW/OLECRANON FRACTURE (Right) and with R chest tube due to pneumothorax following interscalene block.  PMH positive for RA, HTN, PAF and CHF.     PT Comments    Patient progressing well towards PT goals. Reports no pain today. Demonstrates some mild balance deficits but no overt LOB. Eager to return home. HR variable but pt asymptomatic. Pt has been ambulatory with nursing, recommend continuing this. Will plan for stair training next session to prepare for d/c home. Will follow,    Follow Up Recommendations  Home health PT;Supervision/Assistance - 24 hour     Equipment Recommendations  None recommended by PT    Recommendations for Other Services       Precautions / Restrictions Precautions Precautions: Fall Precaution Comments: post op splint to R UE, chest tube Required Braces or Orthoses: Sling Restrictions Weight Bearing Restrictions: Yes RUE Weight Bearing: Non weight bearing Other Position/Activity Restrictions: no weightbearing orders, but cautiously NWB    Mobility  Bed Mobility               General bed mobility comments: still OOB in the recliner on arrival  Transfers Overall transfer level: Needs assistance Equipment used: None Transfers: Sit to/from Stand Sit to Stand: Min guard         General transfer comment: Min guard for safety.   Ambulation/Gait Ambulation/Gait assistance: Min guard Gait Distance (Feet): 350 Feet Assistive device: Rolling walker (2 wheeled) Gait Pattern/deviations: Step-through pattern Gait velocity: decreased   General Gait Details: Some staggering/drifting at times, some mild SOB but no overt LOB.    Stairs             Wheelchair Mobility    Modified Rankin  (Stroke Patients Only)       Balance Overall balance assessment: Needs assistance Sitting-balance support: No upper extremity supported;Feet supported Sitting balance-Leahy Scale: Good     Standing balance support: During functional activity Standing balance-Leahy Scale: Fair Standing balance comment: min guard for safety. Able to stand/mobilize without UE support but slightly unsteady.                            Cognition Arousal/Alertness: Awake/alert Behavior During Therapy: WFL for tasks assessed/performed Overall Cognitive Status: Within Functional Limits for tasks assessed                                        Exercises      General Comments General comments (skin integrity, edema, etc.): Variable HR, asymptomatic.       Pertinent Vitals/Pain Pain Assessment: No/denies pain    Home Living                      Prior Function            PT Goals (current goals can now be found in the care plan section) Progress towards PT goals: Progressing toward goals    Frequency    Min 5X/week      PT Plan Current plan remains appropriate    Co-evaluation              AM-PAC PT "6  Clicks" Daily Activity  Outcome Measure  Difficulty turning over in bed (including adjusting bedclothes, sheets and blankets)?: A Little Difficulty moving from lying on back to sitting on the side of the bed? : A Little Difficulty sitting down on and standing up from a chair with arms (e.g., wheelchair, bedside commode, etc,.)?: None Help needed moving to and from a bed to chair (including a wheelchair)?: A Little Help needed walking in hospital room?: A Little Help needed climbing 3-5 steps with a railing? : A Little 6 Click Score: 19    End of Session Equipment Utilized During Treatment: Gait belt;Other (comment)(sling) Activity Tolerance: Patient tolerated treatment well Patient left: in chair;with call bell/phone within reach;with  family/visitor present Nurse Communication: Mobility status PT Visit Diagnosis: Other abnormalities of gait and mobility (R26.89);History of falling (Z91.81)     Time: 8341-9622 PT Time Calculation (min) (ACUTE ONLY): 18 min  Charges:  $Gait Training: 8-22 mins                     [place], [place], DPT [place] 09/23/2017, 10:05 AM

## 2017-09-23 NOTE — Care Management Important Message (Signed)
Important Message  Patient Details  Name: Cory Gonzalez MRN: [id number] Date of Birth: November 09, 1929   Medicare Important Message Given:  Yes    Bekah Igoe 09/23/2017, 4:08 PM

## 2017-09-23 NOTE — Addendum Note (Signed)
Addendum  created 09/23/17 1959 by Roberts Gaudy, MD   Sign clinical note

## 2017-09-23 NOTE — Progress Notes (Signed)
Occupational Therapy Treatment Patient Details Name: Cory Gonzalez MRN: [id number] DOB: Sep 27, 1929 Today's Date: 09/23/2017    History of present illness Patient is an 84 y/o male admitted after fall now s/p OPEN REDUCTION INTERNAL FIXATION (ORIF) RIGHT ELBOW/OLECRANON FRACTURE (Right) and with R chest tube due to pneumothorax following interscalene block.  PMH positive for RA, HTN, PAF and CHF.    OT comments  Pt demonstrating good progress toward OT goals this session. Facilitated AROM R digits and AAROM R shoulder with education concerning need to complete these throughout the day between therapy sessions. Pt verbalizes understanding. Pt able to complete standing dynamic balance tasks with L UE this session and no LOB although requiring guarding at times. Simulated walk-in shower transfer with grab bars and pt requiring min guard assist for this as well. He is very eager for chest tube to be removed. VSS throughout session. OT will continue to follow while admitted.    Follow Up Recommendations  No OT follow up;Follow surgeon's recommendation for DC plan and follow-up therapies;Supervision/Assistance - 24 hour    Equipment Recommendations  None recommended by OT    Recommendations for Other Services      Precautions / Restrictions Precautions Precautions: Fall Precaution Comments: post op splint to R UE, chest tube Required Braces or Orthoses: Sling Restrictions Weight Bearing Restrictions: Yes RUE Weight Bearing: Non weight bearing Other Position/Activity Restrictions: no weightbearing orders, but cautiously NWB       Mobility Bed Mobility               General bed mobility comments: OOB in recliner on my arrival  Transfers Overall transfer level: Needs assistance Equipment used: None Transfers: Sit to/from Stand Sit to Stand: Min guard         General transfer comment: Min guard for safety.     Balance Overall balance assessment: Needs  assistance Sitting-balance support: No upper extremity supported;Feet supported Sitting balance-Leahy Scale: Good     Standing balance support: During functional activity Standing balance-Leahy Scale: Fair Standing balance comment: Unsteady with dynamic tasks.                            ADL either performed or assessed with clinical judgement   ADL Overall ADL's : Needs assistance/impaired Eating/Feeding: Set up;Sitting   Grooming: Supervision/safety;Standing           Upper Body Dressing : Minimal assistance;Sitting Upper Body Dressing Details (indicate cue type and reason): Mod assist for sling     Toilet Transfer: Ambulation;Min guard Toilet Transfer Details (indicate cue type and reason): Supervision for safety throughout.  Toileting- Clothing Manipulation and Hygiene: Supervision/safety;Sit to/from stand   Tub/ Shower Transfer: Min guard;Ambulation;Grab bars Tub/Shower Transfer Details (indicate cue type and reason): simulated with small step over using grab bar for stability Functional mobility during ADLs: Supervision/safety;Min guard General ADL Comments: Pt wtih flat affect but willing to participate.      Vision   Vision Assessment?: No apparent visual deficits   Perception     Praxis      Cognition Arousal/Alertness: Awake/alert Behavior During Therapy: Flat affect Overall Cognitive Status: Within Functional Limits for tasks assessed                                 General Comments: Flat affect noted but functional with all tasks.         Exercises  Exercises: Other exercises Other Exercises Other Exercises: Facilitated improved dynamic balance with standing reaching tasks this session with L UE requiring min guard assist.  Other Exercises: Facilitated AROM R digits flexion and extension and AAROM R shoulder scaption x10 each.     Shoulder Instructions       General Comments VSS throughout    Pertinent Vitals/ Pain        Pain Assessment: No/denies pain  Home Living                                          Prior Functioning/Environment              Frequency  Min 2X/week        Progress Toward Goals  OT Goals(current goals can now be found in the care plan section)  Progress towards OT goals: Progressing toward goals  Acute Rehab OT Goals Patient Stated Goal: to return home OT Goal Formulation: With patient/family Time For Goal Achievement: 10/04/17 Potential to Achieve Goals: Good ADL Goals Pt Will Perform Grooming: with supervision;standing Pt Will Perform Upper Body Dressing: with set-up;sitting Pt Will Perform Lower Body Dressing: sit to/from stand;with supervision Pt Will Transfer to Toilet: with supervision;ambulating;regular height toilet Pt Will Perform Tub/Shower Transfer: Shower transfer;ambulating;with supervision  Plan Discharge plan remains appropriate    Co-evaluation                 AM-PAC PT "6 Clicks" Daily Activity     Outcome Measure   Help from another person eating meals?: A Little Help from another person taking care of personal grooming?: A Little Help from another person toileting, which includes using toliet, bedpan, or urinal?: A Little Help from another person bathing (including washing, rinsing, drying)?: A Little Help from another person to put on and taking off regular upper body clothing?: A Little Help from another person to put on and taking off regular lower body clothing?: A Little 6 Click Score: 18    End of Session Equipment Utilized During Treatment: Other (comment)(R shoulder sling)  OT Visit Diagnosis: Unsteadiness on feet (R26.81);Muscle weakness (generalized) (M62.81);Pain Pain - Right/Left: Right Pain - part of body: Arm   Activity Tolerance Patient tolerated treatment well   Patient Left in chair;with call bell/phone within reach;with nursing/sitter in room   Nurse Communication          Time:  8372-9021 OT Time Calculation (min): 17 min  Charges: OT General Charges $OT Visit: 1 Visit OT Treatments $Self Care/Home Management : 8-22 mins  Norman Herrlich, MS OTR/L  Pager: [place] 09/23/2017, 10:57 AM

## 2017-09-23 NOTE — Progress Notes (Signed)
Patient is complaining of intermittent upper right chest pain.  Does not have the pain right now. 09/23/2017 1200 pm Marlene Bast, RN

## 2017-09-24 ENCOUNTER — Inpatient Hospital Stay (HOSPITAL_COMMUNITY): Payer: Medicare Other

## 2017-09-24 MED ORDER — LACTULOSE 10 GM/15ML PO SOLN
20.0000 g | Freq: Once | ORAL | Status: AC
  Administered 2017-09-24: 20 g via ORAL
  Filled 2017-09-24: qty 30

## 2017-09-24 MED ORDER — HYDROCODONE-ACETAMINOPHEN 5-325 MG PO TABS: 1.0000 | ORAL_TABLET | ORAL | 0 refills | Status: DC | PRN

## 2017-09-24 NOTE — Progress Notes (Signed)
Patient ID: Cory Gonzalez, male   DOB: [birth year], 84 y.o.   MRN: [id number]   Subjective: 6 Days Post-Op Procedure(s) (LRB): OPEN REDUCTION INTERNAL FIXATION (ORIF) RIGHT ELBOW/OLECRANON FRACTURE (Right) Patient reports pain as mild.    Objective: Vital signs in last 24 hours: Temp:  [97.5 F (36.4 C)-98.7 F (37.1 C)] 97.5 F (36.4 C) (08/26 2220) Pulse Rate:  [82-96] 96 (08/26 2220) Resp:  [14-19] 14 (08/26 2220) BP: (115-136)/(86-95) 136/95 (08/26 2220) SpO2:  [92 %-100 %] 100 % (08/26 2220)  Intake/Output from previous day: 08/26 0701 - 08/27 0700 In: 720 [P.O.:720] Out: 358 [Urine:350; Chest Tube:8] Intake/Output this shift: No intake/output data recorded.  No results for input(s): HGB in the last 72 hours. No results for input(s): WBC, RBC, HCT, PLT in the last 72 hours. No results for input(s): NA, K, CL, CO2, BUN, CREATININE, GLUCOSE, CALCIUM in the last 72 hours. No results for input(s): LABPT, INR in the last 72 hours.  Neurologically intact no dypnea.  Dg Chest Port 1 View  Result Date: 09/23/2017 CLINICAL DATA:  Right chest tube removal today EXAM: PORTABLE CHEST 1 VIEW COMPARISON:  09/23/2017 FINDINGS: Right chest tube removed. No definite pneumothorax. Extensive subcutaneous gas in the right chest is unchanged Mild bibasilar atelectasis unchanged.  No effusion IMPRESSION: Right chest tube removed.  No definite pneumothorax Bibasilar atelectasis Electronically Signed   By: Franchot Gallo M.D.   On: 09/23/2017 16:09    Assessment/Plan: 6 Days Post-Op Procedure(s) (LRB): OPEN REDUCTION INTERNAL FIXATION (ORIF) RIGHT ELBOW/OLECRANON FRACTURE (Right) Plan:  Likely home after Thoracic gives the OK for discharge. Rx placed on chart norco. Restart Xeralto now. Office Friday for dressing change.   [name] 09/24/2017, 8:09 AM

## 2017-09-24 NOTE — Discharge Instructions (Signed)
Keep sling on. See Dr. Lorin Mercy on Friday for splint change. No lifting. No driving until seen on Friday

## 2017-09-24 NOTE — Care Management Note (Signed)
Case Management Note  Patient Details  Name: Cory Gonzalez MRN: [id number] Date of Birth: January 20, 1930  Subjective/Objective:   Patient is an 84 y/o male admitted after fall now s/p OPEN REDUCTION INTERNAL FIXATION (ORIF) RIGHT ELBOW/OLECRANON FRACTURE (Right) and with R chest tube due to pneumothorax following interscalene block.  PTA, pt independent of ADLS, lives with spouse.                   Action/Plan: Pt medically stable for dc home today with family.  PT recommending [place] follow up.  MD prefers no HH follow up; will see pt in office on Friday.  Expected Discharge Date:  09/24/17               Expected Discharge Plan:  Home/Self Care  In-House Referral:     Discharge planning Services  CM Consult  Post Acute Care Choice:    Choice offered to:     DME Arranged:    DME Agency:     HH Arranged:    HH Agency:     Status of Service:  Completed, signed off  If discussed at [employer] of Stay Meetings, dates discussed:    Additional Comments:  Reinaldo Raddle, RN, BSN  Trauma/Neuro ICU Case Manager [phone]

## 2017-09-24 NOTE — Progress Notes (Signed)
Discharge instructions given to patient and his wife at bedside. Discharge folder and instructions along with prescription given to patient. Patient wheeled out to car to go home with wife.

## 2017-09-24 NOTE — Progress Notes (Signed)
6 Days Post-Op Procedure(s) (LRB): OPEN REDUCTION INTERNAL FIXATION (ORIF) RIGHT ELBOW/OLECRANON FRACTURE (Right) Subjective: Only complaint is constipation with no BM since his surgery.  Objective: Vital signs in last 24 hours: Temp:  [97.5 F (36.4 C)-98.7 F (37.1 C)] 97.5 F (36.4 C) (08/26 2220) Pulse Rate:  [63-96] 63 (08/27 0750) Cardiac Rhythm: Heart block (08/27 0701) Resp:  [14-19] 15 (08/27 0750) BP: (115-146)/(86-95) 146/91 (08/27 0750) SpO2:  [92 %-100 %] 97 % (08/27 0750)  Hemodynamic parameters for last 24 hours:    Intake/Output from previous day: 08/26 0701 - 08/27 0700 In: 720 [P.O.:720] Out: 358 [Urine:350; Chest Tube:8] Intake/Output this shift: No intake/output data recorded.  General appearance: alert and cooperative Lungs: clear to auscultation bilaterally Chest wall subcutaneous emphysema stable.  Lab Results: No results for input(s): WBC, HGB, HCT, PLT in the last 72 hours. BMET: No results for input(s): NA, K, CL, CO2, GLUCOSE, BUN, CREATININE, CALCIUM in the last 72 hours.  PT/INR: No results for input(s): LABPROT, INR in the last 72 hours. ABG No results found for: PHART, HCO3, TCO2, ACIDBASEDEF, O2SAT CBG (last 3)  No results for input(s): GLUCAP in the last 72 hours.  Assessment/Plan: S/P Procedure(s) (LRB): OPEN REDUCTION INTERNAL FIXATION (ORIF) RIGHT ELBOW/OLECRANON FRACTURE (Right)  CXR today shows no ptx. I think he can go home and I will have my office call him to set up appt to see me in a week or so with a CXR and will remove the chest tube sutures at that time. Will give him a dose of lactulose prior to discharge for constipation.    LOS: 6 days    Cory Gonzalez 09/24/2017

## 2017-09-27 ENCOUNTER — Ambulatory Visit (INDEPENDENT_AMBULATORY_CARE_PROVIDER_SITE_OTHER): Payer: Medicare Other | Admitting: Orthopaedic Surgery

## 2017-09-27 ENCOUNTER — Encounter (INDEPENDENT_AMBULATORY_CARE_PROVIDER_SITE_OTHER): Payer: Self-pay | Admitting: Orthopaedic Surgery

## 2017-09-27 DIAGNOSIS — S52021A Displaced fracture of olecranon process without intraarticular extension of right ulna, initial encounter for closed fracture: Secondary | ICD-10-CM | POA: Diagnosis not present

## 2017-09-27 NOTE — Discharge Summary (Signed)
Patient ID: Cory Gonzalez MRN: [id number] DOB/AGE: 1929/12/18 84 y.o.  Admit date: 09/18/2017 Discharge date: 09/27/2017  Admission Diagnoses:  Active Problems:   Olecranon fracture   Pneumothorax on right   Discharge Diagnoses:  Active Problems:   Olecranon fracture   Pneumothorax on right  status post Procedure(s): OPEN REDUCTION INTERNAL FIXATION (ORIF) RIGHT ELBOW/OLECRANON FRACTURE  Past Medical History:  Diagnosis Date  . Chronic diastolic heart failure ([place])   . Heart murmur    since childhood, never has had any problems  . Hypertension   . Paroxysmal atrial fibrillation ([facility])   . Pneumonia   . PONV (postoperative nausea and vomiting)    a little nausea  . Rheumatoid arthritis ([place])     Surgeries: Procedure(s): OPEN REDUCTION INTERNAL FIXATION (ORIF) RIGHT ELBOW/OLECRANON FRACTURE on 09/18/2017   Consultants:   Discharged Condition: Improved  Hospital Course: Cory Gonzalez is an 84 y.o. male who was admitted 09/18/2017 for operative treatment of right olecranon fracture. Patient failed conservative treatments (please see the history and physical for the specifics) and had severe unremitting pain that affects sleep, daily activities and work/hobbies. After pre-op clearance, the patient was taken to the operating room on 09/18/2017 and underwent  Procedure(s): OPEN REDUCTION INTERNAL FIXATION (ORIF) RIGHT ELBOW/OLECRANON FRACTURE.    Patient was given perioperative antibiotics:  Anti-infectives (From admission, onward)   Start     Dose/Rate Route Frequency Ordered Stop   09/18/17 2200  ceFAZolin (ANCEF) IVPB 1 g/50 mL premix     1 g 100 mL/hr over 30 Minutes Intravenous Every 6 hours 09/18/17 2045 09/20/17 1022   09/18/17 1432  ceFAZolin (ANCEF) 2-4 GM/100ML-% IVPB    Note to Pharmacy:  [facility]   : cabinet override      09/18/17 1432 09/19/17 0244       Patient was given sequential compression devices and early ambulation to prevent DVT.   Patient  benefited maximally from hospital stay and there were no complications. At the time of discharge, the patient was urinating/moving their bowels without difficulty, tolerating a regular diet, pain is controlled with oral pain medications and they have been cleared by PT/OT.   Recent vital signs: No data found.   Recent laboratory studies: No results for input(s): WBC, HGB, HCT, PLT, NA, K, CL, CO2, BUN, CREATININE, GLUCOSE, INR, CALCIUM in the last 72 hours.  Invalid input(s): PT, 2   Discharge Medications:   Allergies as of 09/24/2017   No Known Allergies     Medication List    TAKE these medications   amLODipine 10 MG tablet Commonly known as:  NORVASC Take 10 mg by mouth daily.   folic acid 1 MG tablet Commonly known as:  FOLVITE Take 2 mg by mouth daily.   HYDROcodone-acetaminophen 5-325 MG tablet Commonly known as:  NORCO/VICODIN Take 1 tablet by mouth every 4 (four) hours as needed for moderate pain (pain score 4-6).   latanoprost 0.005 % ophthalmic solution Commonly known as:  XALATAN Place 1 drop into both eyes at bedtime.   lisinopril-hydrochlorothiazide 20-12.5 MG tablet Commonly known as:  PRINZIDE,ZESTORETIC Take 1 tablet by mouth daily.   methotrexate 2.5 MG tablet Commonly known as:  RHEUMATREX Take 10 mg by mouth every Friday.   multivitamin with minerals Tabs tablet Take 1 tablet by mouth daily.   polyethylene glycol packet Commonly known as:  MIRALAX / GLYCOLAX Take 17 g by mouth daily as needed for moderate constipation.   predniSONE 5 MG tablet Commonly known as:  DELTASONE Take 5 mg by mouth daily with breakfast.   REMICADE IV Inject into the vein every 8 (eight) weeks.   Rivaroxaban 15 MG Tabs tablet Commonly known as:  XARELTO Take 1 tablet (15 mg total) by mouth daily with supper.   vitamin C 500 MG tablet Commonly known as:  ASCORBIC ACID Take 500 mg by mouth daily.       Diagnostic Studies: Dg Shoulder Right  Result Date:  09/10/2017 CLINICAL DATA:  Initial evaluation for acute trauma, fall. EXAM: RIGHT SHOULDER - 2+ VIEW COMPARISON:  None. FINDINGS: No acute fracture or dislocation. Humeral head mildly subluxed superiorly relative to the glenoid, suggesting underlying rotator cuff pathology. Osteoarthritic changes about the acromioclavicular and glenohumeral articulations. No acute soft tissue abnormality. Osteopenia noted. IMPRESSION: 1. No acute osseous abnormality about the right shoulder. 2. 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 Electronically Signed   By: Jeannine Boga M.D.   On: 09/10/2017 20:55   Dg Elbow 2 Views Right  Result Date: 09/18/2017 CLINICAL DATA:  ORIF of a right elbow, olecranon fracture. EXAM: DG C-ARM 61-120 MIN; RIGHT ELBOW - 2 VIEW COMPARISON:  09/10/2017 FINDINGS: Images show placement of a screw and wire across the proximal ulna reducing the olecranon fracture into near anatomic alignment. Orthopedic hardware appears well-seated. Elbow joint is normally aligned. IMPRESSION: Well aligned olecranon fracture following ORIF. Electronically Signed   By: Lajean Manes M.D.   On: 09/18/2017 17:36   Dg Elbow Complete Right  Result Date: 09/10/2017 CLINICAL DATA:  Initial evaluation for acute trauma, fall. EXAM: RIGHT ELBOW - COMPLETE 3+ VIEW COMPARISON:  None. FINDINGS: There is an acute oblique nondisplaced fracture through the olecranon with intra-articular extension. Radial head intact. Distal humerus intact. Soft tissue swelling at the posterior elbow. Scattered osteoarthritic changes noted about the elbow. IMPRESSION: Acute nondisplaced fracture through the olecranon with intra-articular extension. Electronically Signed   By: Jeannine Boga M.D.   On: 09/10/2017 20:52   Dg Chest Port 1 View  Result Date: 09/24/2017 CLINICAL DATA:  84 year old male status post upper extremity ORIF six days ago, chest x-ray clinically significant  pneumothorax after supraclavicular nerve block. EXAM: PORTABLE CHEST 1 VIEW COMPARISON:  09/23/2017 and earlier. FINDINGS: Portable AP upright view at 0600 hours. Persistent moderate volume right chest wall and supraclavicular subcutaneous gas, however, no residual or recurrent right side pneumothorax identified peer stable lung volumes and mediastinal contours. No pulmonary edema. Curvilinear atelectasis at the medial right lung base. IMPRESSION: Continued chest wall subcutaneous emphysema, but no residual or recurrent right pneumothorax. Lung base scarring and atelectasis. Electronically Signed   By: Genevie Ann M.D.   On: 09/24/2017 09:12   Dg Chest Port 1 View  Result Date: 09/23/2017 CLINICAL DATA:  Right chest tube removal today EXAM: PORTABLE CHEST 1 VIEW COMPARISON:  09/23/2017 FINDINGS: Right chest tube removed. No definite pneumothorax. Extensive subcutaneous gas in the right chest is unchanged Mild bibasilar atelectasis unchanged.  No effusion IMPRESSION: Right chest tube removed.  No definite pneumothorax Bibasilar atelectasis Electronically Signed   By: Franchot Gallo M.D.   On: 09/23/2017 16:09   Dg Chest Port 1 View  Result Date: 09/23/2017 CLINICAL DATA:  Follow-up chest tube EXAM: PORTABLE CHEST 1 VIEW COMPARISON:  09/22/2017 FINDINGS: Right-sided thoracostomy tube is again identified. Previously seen apical pneumothorax on the right continues to decrease when compare with the prior exam. Only a minimal component remains. No focal infiltrate is seen. Mild left basilar scarring is noted. Cardiac  shadow is stable. No acute bony abnormality is seen. IMPRESSION: Minimal residual right pneumothorax. Electronically Signed   By: Inez Catalina M.D.   On: 09/23/2017 08:06   Dg Chest Port 1 View  Result Date: 09/22/2017 CLINICAL DATA:  Pneumothorax. EXAM: PORTABLE CHEST 1 VIEW COMPARISON:  Radiograph of September 21, 2017. FINDINGS: The heart size and mediastinal contours are within normal limits. Stable  position of right-sided chest tube is noted. Stable minimal right apical pneumothorax is noted. Large amount of subcutaneous emphysema is seen over the right chest wall. Mild left basilar subsegmental atelectasis or scarring is noted. The visualized skeletal structures are unremarkable. IMPRESSION: Stable position of right-sided chest tube with stable minimal right apical pneumothorax. Large amount of subcutaneous emphysema is seen over the right chest wall. Electronically Signed   By: Marijo Conception, M.D.   On: 09/22/2017 09:38   Dg Chest Port 1 View  Result Date: 09/21/2017 CLINICAL DATA:  Follow-up pneumothorax EXAM: PORTABLE CHEST 1 VIEW COMPARISON:  09/20/2017 FINDINGS: Right chest tube is repositioned. Tiny amount of pleural space air at the apex. Subcutaneous air as seen previously. Mild bibasilar atelectasis again demonstrated. IMPRESSION: Right chest tube repositioned. Tiny amount of pleural air at the apex. Electronically Signed   By: Nelson Chimes M.D.   On: 09/21/2017 07:06   Dg Chest Port 1 View  Result Date: 09/20/2017 CLINICAL DATA:  Status post right chest tube placement today for pneumothorax. EXAM: PORTABLE CHEST 1 VIEW COMPARISON:  Single-view of the chest earlier today. FINDINGS: A new right chest tube is in place with the tip projecting in the apex across the midline over the trachea. Extensive subcutaneous emphysema is seen about the right chest. Right pneumothorax seen on the prior examination has nearly completely resolved. The left lung is expanded and clear. The lungs are emphysematous. Heart size is normal. Aortic atherosclerosis is noted. IMPRESSION: Near complete resolution of right pneumothorax after chest tube placement. Note is again made that the tip of the tube projects across the midline in the apex. Emphysema. Atherosclerosis. Electronically Signed   By: Inge Rise M.D.   On: 09/20/2017 08:52   Dg Chest Port 1 View  Result Date: 09/20/2017 CLINICAL DATA:  Right  chest tube. EXAM: PORTABLE CHEST 1 VIEW COMPARISON:  Chest x-ray 09/19/2017. Chest x-rays 09/18/2017. Chest x-ray 11/12/2015, 12/02/2014. FINDINGS: Right chest tube noted with tip over the right mid chest. Recurrence of right-sided pneumothorax is noted. Pneumothorax is moderate-sized. Diffuse right chest wall subcutaneous emphysema. Mediastinum and hilar structures normal. Heart size normal. Bibasilar atelectasis again noted. Right apical pleural thickening again noted. No pleural effusion. IMPRESSION: 1. Right chest tube noted over the right mid chest. Recurrence of right-sided pneumothorax. Pneumothorax is moderate in size. Diffuse right chest wall subcutaneous emphysema. 2.  Bibasilar atelectasis again noted. Critical Value/emergent results were called by telephone at the time of interpretation on 09/20/2017 at 6:31 am to nurse Roselyn Reef, who verbally acknowledged these results. Electronically Signed   By: Marcello Moores  Register   On: 09/20/2017 06:39   Dg Chest Port 1 View  Result Date: 09/19/2017 CLINICAL DATA:  Pneumothorax.  Chest tube. EXAM: PORTABLE CHEST 1 VIEW COMPARISON:  09/18/2017. FINDINGS: Right chest tube in stable position. No definite pneumothorax noted. Mild bibasilar atelectasis again noted. No pleural effusion. Stable cardiomegaly. Mild right chest wall subcutaneous emphysema again noted. IMPRESSION: 1. Right chest tube in stable position. No definite pneumothorax noted. Chest appears stable from prior exam. 2.  Persistent mild bibasilar atelectasis. 3.  Stable cardiomegaly.  No pulmonary venous congestion Electronically Signed   By: Marcello Moores  Register   On: 09/19/2017 07:58   Dg Chest Port 1 View  Result Date: 09/18/2017 CLINICAL DATA:  Chest tube placement for pneumothorax EXAM: PORTABLE CHEST 1 VIEW COMPARISON:  Study obtained earlier in the day FINDINGS: There is now a chest tube on the right with resolution of pneumothorax. A small amount of soft tissue air is present on the right. There is no  longer midline shift. There is bibasilar atelectasis. Lungs elsewhere are clear. Heart is upper normal in size with pulmonary vascularity normal. There is aortic atherosclerosis. No evident bone lesions. IMPRESSION: Chest tube placed on the right with resolution of pneumothorax. Small amount of soft tissue air evident on the right. Bibasilar atelectasis is present. No consolidation. Stable cardiac silhouette. There is aortic atherosclerosis. Aortic Atherosclerosis (ICD10-I70.0). Electronically Signed   By: Lowella Grip III M.D.   On: 09/18/2017 19:22   Dg Chest Port 1 View  Result Date: 09/18/2017 CLINICAL DATA:  Pneumothorax, postoperative EXAM: PORTABLE CHEST 1 VIEW COMPARISON:  Portable exam 1811 hours compared to 11/12/2015 FINDINGS: Large RIGHT pneumothorax with complete collapse of the RIGHT lung. Mediastinal shift RIGHT to LEFT consistent with tension pneumothorax. Underlying emphysematous changes LEFT lung with LEFT basilar atelectasis and central peribronchial thickening. Normal heart size and pulmonary vascularity. Atherosclerotic calcification aorta. Bones demineralized. IMPRESSION: Large RIGHT tension pneumothorax with complete collapse of the RIGHT lung and mediastinal shift RIGHT to LEFT. Findings discussed with nurse in PACU on 09/18/2017 at 1835 hours; informed that Dr. Cyndia Bent is currently with patient to place chest tube. Electronically Signed   By: Lavonia Dana M.D.   On: 09/18/2017 18:37   Dg C-arm 1-60 Min  Result Date: 09/18/2017 CLINICAL DATA:  ORIF of a right elbow, olecranon fracture. EXAM: DG C-ARM 61-120 MIN; RIGHT ELBOW - 2 VIEW COMPARISON:  09/10/2017 FINDINGS: Images show placement of a screw and wire across the proximal ulna reducing the olecranon fracture into near anatomic alignment. Orthopedic hardware appears well-seated. Elbow joint is normally aligned. IMPRESSION: Well aligned olecranon fracture following ORIF. Electronically Signed   By: Lajean Manes M.D.   On:  09/18/2017 17:36        Discharge Plan:  discharge to home  Disposition:     Signed: Benjiman Core  09/27/2017, 1:36 PM

## 2017-09-27 NOTE — Progress Notes (Signed)
   Post-Op Visit Note   Patient: Cory Gonzalez           Date of Birth: 02/05/1929           MRN: [id number] Visit Date: 09/27/2017 PCP: [facility]   Assessment & Plan: Postop ORIF right olecranon.  He has tension band wiring and lag screw with washer.  Staples removed long-arm cast applied.  Return in 3 weeks for cast off and AP lateral x-ray of the elbow.  He is not taking any pain medication currently.  Patient had preoperative block with pneumothorax noted in the recovery room.  He had chest tube placed with reinflation of the lung and then with moving bed to chair with therapy some mild the chest tube was partially removed with repeat pneumothorax and a second chest tube placed.  No current dyspnea.  He has appointment on the 11th with Dr. Cyndia Bent.  Wife asked as to changes for before chest tube dressing and both sites look good new 4 x 4 applied.  Chief Complaint:  Chief Complaint  Patient presents with  . Right Elbow - Routine Post Op    09/18/17  ORIF Right Elbow Olecranon Fracture   Visit Diagnoses:  1. Closed fracture of right olecranon process, initial encounter     Plan: Staples removed Steri-Strips applied long-arm cast applied.  Return in 3 weeks for cast off and AP lateral x-ray of the right elbow.  Follow-Up Instructions: No follow-ups on file.   Orders:  No orders of the defined types were placed in this encounter.  No orders of the defined types were placed in this encounter.   Imaging: No results found.  PMFS History: Patient Active Problem List   Diagnosis Date Noted  . Pneumothorax on right 09/19/2017  . Olecranon fracture 09/18/2017   Past Medical History:  Diagnosis Date  . Chronic diastolic heart failure ([place])   . Heart murmur    since childhood, never has had any problems  . Hypertension   . Paroxysmal atrial fibrillation ([facility])   . Pneumonia   . PONV (postoperative nausea and vomiting)    a little nausea  . Rheumatoid arthritis  ([place])     Family History  Problem Relation Age of Onset  . COPD Sister   . Dementia Sister   . Aneurysm Brother     Past Surgical History:  Procedure Laterality Date  . CATARACT EXTRACTION Bilateral   . COLONOSCOPY    . ORIF ELBOW FRACTURE Right 09/18/2017   Procedure: OPEN REDUCTION INTERNAL FIXATION (ORIF) RIGHT ELBOW/OLECRANON FRACTURE;  Surgeon: [name], MD;  Location: [facility];  Service: Orthopedics;  Laterality: Right;  . PROSTATE SURGERY     Social History   Occupational History  . Not on file  Tobacco Use  . Smoking status: Current Every Day Smoker    Years: 50.00    Types: Pipe  . Smokeless tobacco: Former Network engineer and Sexual Activity  . Alcohol use: No    Alcohol/week: 0.0 standard drinks  . Drug use: No  . Sexual activity: Not on file

## 2017-10-08 ENCOUNTER — Other Ambulatory Visit: Payer: Self-pay | Admitting: Surgery

## 2017-10-08 DIAGNOSIS — J939 Pneumothorax, unspecified: Secondary | ICD-10-CM

## 2017-10-09 ENCOUNTER — Ambulatory Visit (INDEPENDENT_AMBULATORY_CARE_PROVIDER_SITE_OTHER): Payer: Medicare Other | Admitting: Surgery

## 2017-10-09 ENCOUNTER — Ambulatory Visit
Admission: RE | Admit: 2017-10-09 | Discharge: 2017-10-09 | Disposition: A | Discharge status: Home / Self Care | Payer: Medicare Other | Source: Ambulatory Visit | Attending: Surgery | Admitting: Surgery

## 2017-10-09 VITALS — BP 114/72 | HR 67 | Resp 20 | Ht 65.5 in | Wt 141.0 lb

## 2017-10-09 DIAGNOSIS — J939 Pneumothorax, unspecified: Secondary | ICD-10-CM | POA: Diagnosis not present

## 2017-10-09 DIAGNOSIS — J439 Emphysema, unspecified: Secondary | ICD-10-CM | POA: Diagnosis not present

## 2017-10-09 DIAGNOSIS — I2510 Atherosclerotic heart disease of native coronary artery without angina pectoris: Secondary | ICD-10-CM

## 2017-10-09 NOTE — Progress Notes (Signed)
     HPI:  The patient returns today for follow-up after treatment of an iatrogenic right pneumothorax with a chest tube. He had a nerve block performed by anesthesia after elbow surgery and developed a tension right pneumothorax. Unfortunately the first tube that was placed got pulled out accidentally and a second tube had to be placed. The air leak stopped in a couple days and the tube was removed. He has done well since discharge.  Current Outpatient Medications  Medication Sig Dispense Refill  . amLODipine (NORVASC) 10 MG tablet Take 10 mg by mouth daily.     . folic acid (FOLVITE) 1 MG tablet Take 2 mg by mouth daily.     . InFLIXimab (REMICADE IV) Inject into the vein every 8 (eight) weeks.    [name] Kitchen latanoprost (XALATAN) 0.005 % ophthalmic solution Place 1 drop into both eyes at bedtime.   10  . lisinopril-hydrochlorothiazide (PRINZIDE,ZESTORETIC) 20-12.5 MG tablet Take 1 tablet by mouth daily.  0  . methotrexate (RHEUMATREX) 2.5 MG tablet Take 10 mg by mouth every Friday.   1  . Multiple Vitamin (MULITIVITAMIN WITH MINERALS) TABS Take 1 tablet by mouth daily.    . polyethylene glycol (MIRALAX / GLYCOLAX) packet Take 17 g by mouth daily as needed for moderate constipation.    . predniSONE (DELTASONE) 5 MG tablet Take 5 mg by mouth daily with breakfast.    . rivaroxaban (XARELTO) 15 MG TABS tablet Take 1 tablet (15 mg total) by mouth daily with supper. 30 tablet 5  . vitamin C (ASCORBIC ACID) 500 MG tablet Take 500 mg by mouth daily.    [name] Kitchen HYDROcodone-acetaminophen (NORCO/VICODIN) 5-325 MG tablet Take 1 tablet by mouth every 4 (four) hours as needed for moderate pain (pain score 4-6). (Patient not taking: Reported on 09/27/2017) 20 tablet 0   No current facility-administered medications for this visit.      Physical Exam: BP 114/72   Pulse 67   Resp 20   Ht 5' 5.5" (1.664 m)   Wt 141 lb (64 kg)   SpO2 97% Comment: RA  BMI 23.11 kg/m  He looks well Lungs are clear The chest tube  sites are healing well and the suture removed.  Diagnostic Tests:  CLINICAL DATA:  84 year old who had a tension pneumothorax on 09/18/2017 treated with chest tube decompression. Patient discharged approximately 2 weeks ago. Follow-up subcutaneous emphysema.  EXAM: CHEST - 2 VIEW  COMPARISON:  09/24/2017, 09/23/2017 and earlier.  FINDINGS: Cardiac silhouette upper normal in size. Ectasia of the descending thoracic aorta with moderate atherosclerosis, unchanged. Prominent central LEFT pulmonary artery, unchanged. Hilar and mediastinal contours otherwise unremarkable.  No residual or recurrent RIGHT pneumothorax. Emphysematous changes throughout both lungs with scarring in the BILATERAL lower lobes, lingula and RIGHT MIDDLE LOBE, unchanged. No new pulmonary parenchymal abnormalities. Near complete resolution of subcutaneous emphysema in the RIGHT chest wall. Degenerative changes involving the thoracic and UPPER lumbar spine.  IMPRESSION: 1. No evidence of residual or recurrent RIGHT pneumothorax. 2. Scarring in the BILATERAL lower lobes, lingula and RIGHT middle lobe. No acute cardiopulmonary disease. 3.  Emphysema. (ICD10-J43.9) 4. Near complete resolution of subcutaneous emphysema in the RIGHT chest wall.   Electronically Signed   By: Evangeline Dakin M.D.   On: 10/09/2017 10:17  Impression:  The pneumothorax is resolved.   Plan:  No further follow-up is required.   [name], MD [facility] [phone]

## 2017-10-13 ENCOUNTER — Encounter: Payer: Self-pay | Admitting: Surgery

## 2017-10-18 ENCOUNTER — Ambulatory Visit (INDEPENDENT_AMBULATORY_CARE_PROVIDER_SITE_OTHER): Payer: Medicare Other

## 2017-10-18 ENCOUNTER — Ambulatory Visit (INDEPENDENT_AMBULATORY_CARE_PROVIDER_SITE_OTHER): Payer: Medicare Other | Admitting: Orthopaedic Surgery

## 2017-10-18 ENCOUNTER — Encounter (INDEPENDENT_AMBULATORY_CARE_PROVIDER_SITE_OTHER): Payer: Self-pay | Admitting: Orthopaedic Surgery

## 2017-10-18 VITALS — Ht 65.5 in | Wt 141.0 lb

## 2017-10-18 DIAGNOSIS — S52021A Displaced fracture of olecranon process without intraarticular extension of right ulna, initial encounter for closed fracture: Secondary | ICD-10-CM

## 2017-10-18 NOTE — Progress Notes (Signed)
   Post-Op Visit Note   Patient: Cory Gonzalez           Date of Birth: 09-07-1929           MRN: [id number] Visit Date: 10/18/2017 [facility]CP: [facility]   Assessment & Plan: 3 weeks post ORIF olecranon.  Steri-Strips look good.  He will use his sling he can remove his arm out of the sling to work on gentle range of motion of his elbow which we discussed.  Recheck 3 weeks AP lateral x-ray on return.  Chief Complaint:  Chief Complaint  Patient presents with  . Right Elbow - Follow-up    09/18/17 ORIF Right Elbow/Olecranon Fracture   Visit Diagnoses:  1. Closed fracture of right olecranon process, initial encounter     Plan: Return in 3 weeks x-rays on return as above.  Follow-Up Instructions: Return in about 3 weeks (around 11/08/2017).   Orders:  Orders Placed This Encounter  Procedures  . XR Elbow 2 Views Right   No orders of the defined types were placed in this encounter.   Imaging: Xr Elbow 2 Views Right  Result Date: 10/18/2017 2 view x-rays right elbow obtained and reviewed this shows ORIF olecranon good position alignment of the fracture and hardware as well as figure-of-eight tension band wire. Impression: Satisfactory ORIF right olecranon fracture.   PMFS History: Patient Active Problem List   Diagnosis Date Noted  . Pneumothorax on right 09/19/2017  . Olecranon fracture 09/18/2017   Past Medical History:  Diagnosis Date  . Chronic diastolic heart failure ([place])   . Heart murmur    since childhood, never has had any problems  . Hypertension   . Paroxysmal atrial fibrillation ([facility])   . Pneumonia   . PONV (postoperative nausea and vomiting)    a little nausea  . Rheumatoid arthritis ([place])     Family History  Problem Relation Age of Onset  . COPD Sister   . Dementia Sister   . Aneurysm Brother     Past Surgical History:  Procedure Laterality Date  . CATARACT EXTRACTION Bilateral   . COLONOSCOPY    . ORIF ELBOW FRACTURE Right 09/18/2017     Procedure: OPEN REDUCTION INTERNAL FIXATION (ORIF) RIGHT ELBOW/OLECRANON FRACTURE;  Surgeon: [name], MD;  Location: [facility];  Service: Orthopedics;  Laterality: Right;  . PROSTATE SURGERY     Social History   Occupational History  . Not on file  Tobacco Use  . Smoking status: Current Every Day Smoker    Years: 50.00    Types: Pipe  . Smokeless tobacco: Former Network engineer and Sexual Activity  . Alcohol use: No    Alcohol/week: 0.0 standard drinks  . Drug use: No  . Sexual activity: Not on file

## 2017-10-24 DIAGNOSIS — M0589 Other rheumatoid arthritis with rheumatoid factor of multiple sites: Secondary | ICD-10-CM | POA: Diagnosis not present

## 2017-10-25 DIAGNOSIS — R5383 Other fatigue: Secondary | ICD-10-CM | POA: Diagnosis not present

## 2017-10-25 DIAGNOSIS — N183 Chronic kidney disease, stage 3 (moderate): Secondary | ICD-10-CM | POA: Diagnosis not present

## 2017-11-01 DIAGNOSIS — J449 Chronic obstructive pulmonary disease, unspecified: Secondary | ICD-10-CM | POA: Diagnosis not present

## 2017-11-01 DIAGNOSIS — N183 Chronic kidney disease, stage 3 (moderate): Secondary | ICD-10-CM | POA: Diagnosis not present

## 2017-11-01 DIAGNOSIS — I4891 Unspecified atrial fibrillation: Secondary | ICD-10-CM | POA: Diagnosis not present

## 2017-11-01 DIAGNOSIS — M0589 Other rheumatoid arthritis with rheumatoid factor of multiple sites: Secondary | ICD-10-CM | POA: Diagnosis not present

## 2017-11-01 DIAGNOSIS — M7989 Other specified soft tissue disorders: Secondary | ICD-10-CM | POA: Diagnosis not present

## 2017-11-01 DIAGNOSIS — I10 Essential (primary) hypertension: Secondary | ICD-10-CM | POA: Diagnosis not present

## 2017-11-01 DIAGNOSIS — Z23 Encounter for immunization: Secondary | ICD-10-CM | POA: Diagnosis not present

## 2017-11-01 DIAGNOSIS — I5032 Chronic diastolic (congestive) heart failure: Secondary | ICD-10-CM | POA: Diagnosis not present

## 2017-11-04 DIAGNOSIS — H40053 Ocular hypertension, bilateral: Secondary | ICD-10-CM | POA: Diagnosis not present

## 2017-11-06 ENCOUNTER — Ambulatory Visit (INDEPENDENT_AMBULATORY_CARE_PROVIDER_SITE_OTHER): Payer: Medicare Other

## 2017-11-06 ENCOUNTER — Encounter (INDEPENDENT_AMBULATORY_CARE_PROVIDER_SITE_OTHER): Payer: Self-pay | Admitting: Orthopaedic Surgery

## 2017-11-06 ENCOUNTER — Ambulatory Visit (INDEPENDENT_AMBULATORY_CARE_PROVIDER_SITE_OTHER): Payer: Medicare Other | Admitting: Orthopaedic Surgery

## 2017-11-06 VITALS — BP 139/90 | HR 79 | Ht 65.5 in | Wt 141.0 lb

## 2017-11-06 DIAGNOSIS — S52021A Displaced fracture of olecranon process without intraarticular extension of right ulna, initial encounter for closed fracture: Secondary | ICD-10-CM

## 2017-11-06 NOTE — Progress Notes (Signed)
   Post-Op Visit Note   Patient: Cory Gonzalez           Date of Birth: 19-Nov-1929           MRN: [id number] Visit Date: 11/06/2017 PCP: [facility]   Assessment & Plan: Follow-up ORIF right olecranon on 09/18/2017.  X-rays demonstrate interval healing.  He has near full extension.  He lacks 10 to 12 cm touching thumb to his shoulder.  He is happy with the result of surgery does not have any pain and has not had any problems with shortness of breath which was pneumothorax that occurred after his block preoperatively.  I will check him for final visit in 2 months.  Single lateral and AP x-ray right elbow on return.  Chief Complaint:  Chief Complaint  Patient presents with  . Right Elbow - Follow-up    09/18/17 ORIF Right Elbow/Olecranon Fracture   Visit Diagnoses:  1. Closed fracture of right olecranon process, initial encounter     Plan: Return 2 months repeat x-rays right elbow on return.  Follow-Up Instructions: No follow-ups on file.   Orders:  Orders Placed This Encounter  Procedures  . XR Elbow 2 Views Right   No orders of the defined types were placed in this encounter.   Imaging: No results found.  PMFS History: Patient Active Problem List   Diagnosis Date Noted  . Pneumothorax on right 09/19/2017  . Olecranon fracture 09/18/2017   Past Medical History:  Diagnosis Date  . Chronic diastolic heart failure ([place])   . Heart murmur    since childhood, never has had any problems  . Hypertension   . Paroxysmal atrial fibrillation (HCC)   . Pneumonia   . PONV (postoperative nausea and vomiting)    a little nausea  . Rheumatoid arthritis ([place])     Family History  Problem Relation Age of Onset  . COPD Sister   . Dementia Sister   . Aneurysm Brother     Past Surgical History:  Procedure Laterality Date  . CATARACT EXTRACTION Bilateral   . COLONOSCOPY    . ORIF ELBOW FRACTURE Right 09/18/2017   Procedure: OPEN REDUCTION INTERNAL FIXATION (ORIF)  RIGHT ELBOW/OLECRANON FRACTURE;  Surgeon: [name], MD;  Location: [place];  Service: Orthopedics;  Laterality: Right;  . PROSTATE SURGERY     Social History   Occupational History  . Not on file  Tobacco Use  . Smoking status: Current Every Day Smoker    Years: 50.00    Types: Pipe  . Smokeless tobacco: Former Network engineer and Sexual Activity  . Alcohol use: No    Alcohol/week: 0.0 standard drinks  . Drug use: No  . Sexual activity: Not on file

## 2017-12-19 DIAGNOSIS — M0589 Other rheumatoid arthritis with rheumatoid factor of multiple sites: Secondary | ICD-10-CM | POA: Diagnosis not present

## 2017-12-23 ENCOUNTER — Ambulatory Visit (INDEPENDENT_AMBULATORY_CARE_PROVIDER_SITE_OTHER): Payer: Medicare Other | Admitting: Cardiovascular Disease

## 2017-12-23 ENCOUNTER — Encounter: Payer: Self-pay | Admitting: Cardiovascular Disease

## 2017-12-23 VITALS — BP 132/82 | HR 73 | Ht 65.5 in | Wt 144.4 lb

## 2017-12-23 DIAGNOSIS — I10 Essential (primary) hypertension: Secondary | ICD-10-CM

## 2017-12-23 DIAGNOSIS — I2510 Atherosclerotic heart disease of native coronary artery without angina pectoris: Secondary | ICD-10-CM | POA: Diagnosis not present

## 2017-12-23 DIAGNOSIS — I4891 Unspecified atrial fibrillation: Secondary | ICD-10-CM | POA: Diagnosis not present

## 2017-12-23 NOTE — Progress Notes (Signed)
Cardiology Office Note   Date:  12/23/2017   ID:  Geovany Trudo, DOB [birth year], MRN [id number]  PCP:  [facility]  Cardiologist:   Skeet Latch, MD   No chief complaint on file.   History of Present Illness: Rachael Zapanta is a 84 y.o. male with hypertension, chronic diastolic heart failure, paroxysmal atrial fibrillation, COPD, CKD 3 and RA who presents for follow up.   Mr. Welch saw his PCP, Dr. Thressa Sheller on 04/12/15 and was noted to be in atrial fibrillation with a heart rate of 56. He was started on Xarelto and scheduled to follow-up with cardiology.  He was seen in clinic 04/2015 and referred for an echo that revealed LVEF 60-65% with grade 1 diastolic dysfunction.  At that appointment he also reported episodes of intermittent atypical chest pain and was referred for [facility] that was negative for ischemia.  Since his last appointment Mr. Olden underwent surgery on his elbow.  That hospitalization was complicated by a R pneumothorax that occurred in the setting of getting a nerve block.  He had to have a chest tube placed twice.  He also had his car trunk fall on his hand.  This was comp gated by bleeding and infection that required antibiotics.  He is otherwise been doing well.  He has no chest pain or shortness of breath.  He has no lower extremity edema, orthopnea, or PND.  Yesterday he felt odd for most of the day.  He felt as though he were drunk.  He checked his blood pressure and it was 120/82.  His heart rate was 67.  He has had these dizzy spells intermittently for years and is unsure what causes them.  He denies any syncope or presyncope.   Past Medical History:  Diagnosis Date  . Chronic diastolic heart failure ([place])   . Heart murmur    since childhood, never has had any problems  . Hypertension   . Paroxysmal atrial fibrillation (HCC)   . Pneumonia   . PONV (postoperative nausea and vomiting)    a little nausea  . Rheumatoid arthritis  [facility])     Past Surgical History:  Procedure Laterality Date  . CATARACT EXTRACTION Bilateral   . COLONOSCOPY    . ORIF ELBOW FRACTURE Right 09/18/2017   Procedure: OPEN REDUCTION INTERNAL FIXATION (ORIF) RIGHT ELBOW/OLECRANON FRACTURE;  Surgeon: [name], MD;  Location: [place];  Service: Orthopedics;  Laterality: Right;  . PROSTATE SURGERY       Current Outpatient Medications  Medication Sig Dispense Refill  . amLODipine (NORVASC) 10 MG tablet Take 10 mg by mouth daily.     . folic acid (FOLVITE) 1 MG tablet Take 2 mg by mouth daily.     . InFLIXimab (REMICADE IV) Inject into the vein every 8 (eight) weeks.    [name] Kitchen latanoprost (XALATAN) 0.005 % ophthalmic solution Place 1 drop into both eyes at bedtime.   10  . lisinopril-hydrochlorothiazide (PRINZIDE,ZESTORETIC) 20-12.5 MG tablet Take 1 tablet by mouth daily.  0  . methotrexate (RHEUMATREX) 2.5 MG tablet Take 10 mg by mouth every Friday.   1  . Multiple Vitamin (MULITIVITAMIN WITH MINERALS) TABS Take 1 tablet by mouth daily.    . predniSONE (DELTASONE) 5 MG tablet Take 5 mg by mouth daily with breakfast.    . rivaroxaban (XARELTO) 15 MG TABS tablet Take 1 tablet (15 mg total) by mouth daily with supper. 30 tablet 5   No current facility-administered medications  for this visit.     Allergies:   Patient has no known allergies.    Social History:  The patient  reports that he has been smoking pipe. He has smoked for the past 50.00 years. He has quit using smokeless tobacco. He reports that he does not drink alcohol or use drugs.   Family History:  The patient's family history includes Aneurysm in his brother; COPD in his sister; Dementia in his sister.    ROS:  Please see the history of present illness.   Otherwise, review of systems are positive for none.   All other systems are reviewed and negative.    PHYSICAL EXAM: VS:  BP 132/82   Pulse 73   Ht 5' 5.5" (1.664 m)   Wt 144 lb 6.4 oz (65.5 kg)   BMI 23.66 kg/m  , BMI  Body mass index is 23.66 kg/m. GENERAL:  Well appearing HEENT: Pupils equal round and reactive, fundi not visualized, oral mucosa unremarkable NECK:  No jugular venous distention, waveform within normal limits, carotid upstroke brisk and symmetric, no bruits LUNGS:  Clear to auscultation bilaterally HEART:  RRR.  PMI not displaced or sustained,S1 and S2 within normal limits, no S3, no S4, no clicks, no rubs, II/VI systolic murmurs ABD:  Flat, positive bowel sounds normal in frequency in pitch, no bruits, no rebound, no guarding, no midline pulsatile mass, no hepatomegaly, no splenomegaly EXT:  2 plus pulses throughout, no edema, no cyanosis no clubbing SKIN:  No rashes no nodules NEURO:  Cranial nerves II through XII grossly intact, motor grossly intact throughout PSYCH:  Cognitively intact, oriented to person place and time   EKG:  EKG is ordered today. The ekg ordered 05/03/15 demonstrates sinus rhythm rate 72 bpm.    04/12/15: Atrial fibrillation. Rate 60 bpm. Left axis deviation. 09/17/17: Sinus bradycardia.  Rate 51 bpm.    Echo 05/13/15: LVEF 60-65%.  Grade 1 diastolic dysfunction.  Lexiscan Myoview 05/13/15:   Normal Lexiscan myoview study  There was no ST segment deviation noted during stress.  The study is normal.  This is a low risk study.  Nuclear stress EF: 65%. Normal LV function    Recent Labs: 09/18/2017: Hemoglobin 12.9; Platelets 243 09/21/2017: BUN 17; Creatinine, Ser 0.98; Potassium 3.6; Sodium 138   04/25/15: WBC 5.0, hemoglobin 13.1, hematocrit 38, platelets 208 Sodium 136, potassium 4, BUN 24, creatinine 1 AST 31, ALT 27   Lipid Panel No results found for: CHOL, TRIG, HDL, CHOLHDL, VLDL, LDLCALC, LDLDIRECT    Wt Readings from Last 3 Encounters:  12/23/17 144 lb 6.4 oz (65.5 kg)  11/06/17 141 lb (64 kg)  10/18/17 141 lb (64 kg)      ASSESSMENT AND PLAN:  # Paroxysmal atrial fibrillation:  Mr. Knoop remains in this rhythm.  He has not had any known  recurrent episodes of atrial fibrillation.  He was asymptomatic when in atrial fibrillation. Continue Xarelto.  Given his frequent falls I would rather air on the side of less anticoagulation.  Encouraged him to use his cane when walking.  No beta blocker 2/2 bradycardia.  This patients CHA2DS2-VASc Score and unadjusted Ischemic Stroke Rate (% per year) is equal to 3.2 % stroke rate/year from a score of 3 Above score calculated as 1 point each if present [CHF, HTN, DM, Vascular=MI/PAD/Aortic Plaque, Age if 65-74, or Male] Above score calculated as 2 points each if present [Age > 75, or Stroke/TIA/TE]  # Murmur: Stable and lifelong.  No significant valvular  disease on echo in 2017.  # Hypertension: Blood pressure was elevated but better on repeat.  Continue lisinopril/HCTZ and amlodipine.    Current medicines are reviewed at length with the patient today.  The patient does not have concerns regarding medicines.  The following changes have been made:  Restart Xarelto post-op.  Labs/ tests ordered today include:   No orders of the defined types were placed in this encounter.    Disposition:   FU with Kwanza Cancelliere C. Oval Linsey, MD, [facility] 6 months     Signed, [place] Oval Linsey, MD, [facility]  12/23/2017 1:16 PM    [facility]

## 2017-12-23 NOTE — Patient Instructions (Signed)
Medication Instructions:  Your physician recommends that you continue on your current medications as directed. Please refer to the Current Medication list given to you today.  If you need a refill on your cardiac medications before your next appointment, please call your pharmacy.   Lab work: NONE     Testing/Procedures: NONE  Follow-Up: At [facility], you and your health needs are our priority.  As part of our continuing mission to provide you with exceptional heart care, we have created designated Provider Care Teams.  These Care Teams include your primary Cardiologist (physician) and Advanced Practice Providers (APPs -  Physician Assistants and Nurse Practitioners) who all work together to provide you with the care you need, when you need it. You will need a follow up appointment in 6 months.  Please call our office 2 months in advance to schedule this appointment.  You may see DR [place] or one of the following Advanced Practice Providers on your designated Care Team:   Luke Kilroy, PA-C Krista Kroeger, PA-C . Callie Goodrich, PA-C    

## 2018-01-08 ENCOUNTER — Ambulatory Visit (INDEPENDENT_AMBULATORY_CARE_PROVIDER_SITE_OTHER): Payer: Medicare Other

## 2018-01-08 ENCOUNTER — Ambulatory Visit (INDEPENDENT_AMBULATORY_CARE_PROVIDER_SITE_OTHER): Payer: Medicare Other | Admitting: Orthopaedic Surgery

## 2018-01-08 ENCOUNTER — Encounter (INDEPENDENT_AMBULATORY_CARE_PROVIDER_SITE_OTHER): Payer: Self-pay | Admitting: Orthopaedic Surgery

## 2018-01-08 VITALS — BP 134/81 | HR 57

## 2018-01-08 DIAGNOSIS — S52021A Displaced fracture of olecranon process without intraarticular extension of right ulna, initial encounter for closed fracture: Secondary | ICD-10-CM

## 2018-01-08 DIAGNOSIS — I2510 Atherosclerotic heart disease of native coronary artery without angina pectoris: Secondary | ICD-10-CM

## 2018-01-08 NOTE — Progress Notes (Signed)
Office Visit Note   Patient: Cory Gonzalez           Date of Birth: August 15, 1929           MRN: [id number] Visit Date: 01/08/2018              Requested by: [facility] [address] [place], [place] [address] PCP: [facility]   Assessment & Plan: Visit Diagnoses:  1. Closed fracture of right olecranon process, initial encounter     Plan: We will release him from care he is happy with the results of fixation of the elbow has good elbow function no pain and good strength.  Return PRN  Follow-Up Instructions: No follow-ups on file.   Orders:  Orders Placed This Encounter  Procedures  . XR Elbow 2 Views Right   No orders of the defined types were placed in this encounter.     Procedures: No procedures performed   Clinical Data: No additional findings.   Subjective: Chief Complaint  Patient presents with  . Right Elbow - Pain, Follow-up    HPI 84 year old male post ORIF right olecranon with tension band fixation on 09/18/2017.  He returns for repeat x-rays.  He lacks about 4 degrees reaching full extension and can flex with thumb 3 to 4 inches from touching shoulder.  He can get his hand to his ear top of his head and does not have any pain.  Patient had a preoperative block and had complication with a pneumothorax treated with chest tube with satisfactory resolution.  Review of Systems reviewed updated unchanged from elbow surgery in August.   Objective: Vital Signs: BP 134/81   Pulse (!) 57   Physical Exam  Constitutional: He is oriented to person, place, and time. He appears well-developed and well-nourished.  HENT:  Head: Normocephalic and atraumatic.  Eyes: Pupils are equal, round, and reactive to light. EOM are normal.  Neck: No tracheal deviation present. No thyromegaly present.  Cardiovascular: Normal rate.  Pulmonary/Chest: Effort normal. He has no wheezes.  Abdominal: Soft. Bowel sounds are normal.    Neurological: He is alert and oriented to person, place, and time.  Skin: Skin is warm and dry. Capillary refill takes less than 2 seconds.  Psychiatric: He has a normal mood and affect. His behavior is normal. Judgment and thought content normal.    Ortho Exam good extension flexion of the elbow no pain no prominence of the screw, washer or wire.  He is happy with the result no crepitus with elbow range of motion.  Specialty Comments:  No specialty comments available.  Imaging: Xr Elbow 2 Views Right  Result Date: 01/08/2018 2 view x-rays right elbow obtained and reviewed.  This shows lag screw fixation olecranon fracture with figure-of-eight wiring with interval healing.  Position looks good no back out of the screw. Impression: Healed right olecranon fracture fixed with tension band fixation.    PMFS History: Patient Active Problem List   Diagnosis Date Noted  . Pneumothorax on right 09/19/2017  . Olecranon fracture 09/18/2017   Past Medical History:  Diagnosis Date  . Chronic diastolic heart failure ([place])   . Heart murmur    since childhood, never has had any problems  . Hypertension   . Paroxysmal atrial fibrillation (HCC)   . Pneumonia   . PONV (postoperative nausea and vomiting)    a little nausea  . Rheumatoid arthritis ([place])     Family History  Problem Relation Age of Onset  .  COPD Sister   . Dementia Sister   . Aneurysm Brother     Past Surgical History:  Procedure Laterality Date  . CATARACT EXTRACTION Bilateral   . COLONOSCOPY    . ORIF ELBOW FRACTURE Right 09/18/2017   Procedure: OPEN REDUCTION INTERNAL FIXATION (ORIF) RIGHT ELBOW/OLECRANON FRACTURE;  Surgeon: [name], MD;  Location: [place];  Service: Orthopedics;  Laterality: Right;  . PROSTATE SURGERY     Social History   Occupational History  . Not on file  Tobacco Use  . Smoking status: Current Every Day Smoker    Years: 50.00    Types: Pipe  . Smokeless tobacco: Former Network engineer  and Sexual Activity  . Alcohol use: No    Alcohol/week: 0.0 standard drinks  . Drug use: No  . Sexual activity: Not on file

## 2018-01-13 DIAGNOSIS — M4120 Other idiopathic scoliosis, site unspecified: Secondary | ICD-10-CM | POA: Diagnosis not present

## 2018-01-13 DIAGNOSIS — M25511 Pain in right shoulder: Secondary | ICD-10-CM | POA: Diagnosis not present

## 2018-01-13 DIAGNOSIS — M0589 Other rheumatoid arthritis with rheumatoid factor of multiple sites: Secondary | ICD-10-CM | POA: Diagnosis not present

## 2018-01-13 DIAGNOSIS — R42 Dizziness and giddiness: Secondary | ICD-10-CM | POA: Diagnosis not present

## 2018-01-13 DIAGNOSIS — Z79899 Other long term (current) drug therapy: Secondary | ICD-10-CM | POA: Diagnosis not present

## 2018-02-13 DIAGNOSIS — M0589 Other rheumatoid arthritis with rheumatoid factor of multiple sites: Secondary | ICD-10-CM | POA: Diagnosis not present

## 2018-04-10 DIAGNOSIS — M0589 Other rheumatoid arthritis with rheumatoid factor of multiple sites: Secondary | ICD-10-CM | POA: Diagnosis not present

## 2018-04-25 DIAGNOSIS — I129 Hypertensive chronic kidney disease with stage 1 through stage 4 chronic kidney disease, or unspecified chronic kidney disease: Secondary | ICD-10-CM | POA: Diagnosis not present

## 2018-04-25 DIAGNOSIS — I10 Essential (primary) hypertension: Secondary | ICD-10-CM | POA: Diagnosis not present

## 2018-04-25 DIAGNOSIS — Z0000 Encounter for general adult medical examination without abnormal findings: Secondary | ICD-10-CM | POA: Diagnosis not present

## 2018-04-25 DIAGNOSIS — Z79899 Other long term (current) drug therapy: Secondary | ICD-10-CM | POA: Diagnosis not present

## 2018-04-25 DIAGNOSIS — Z125 Encounter for screening for malignant neoplasm of prostate: Secondary | ICD-10-CM | POA: Diagnosis not present

## 2018-04-25 DIAGNOSIS — N183 Chronic kidney disease, stage 3 (moderate): Secondary | ICD-10-CM | POA: Diagnosis not present

## 2018-05-02 DIAGNOSIS — N183 Chronic kidney disease, stage 3 (moderate): Secondary | ICD-10-CM | POA: Diagnosis not present

## 2018-05-02 DIAGNOSIS — F17290 Nicotine dependence, other tobacco product, uncomplicated: Secondary | ICD-10-CM | POA: Diagnosis not present

## 2018-05-02 DIAGNOSIS — I4891 Unspecified atrial fibrillation: Secondary | ICD-10-CM | POA: Diagnosis not present

## 2018-05-02 DIAGNOSIS — J449 Chronic obstructive pulmonary disease, unspecified: Secondary | ICD-10-CM | POA: Diagnosis not present

## 2018-05-02 DIAGNOSIS — I129 Hypertensive chronic kidney disease with stage 1 through stage 4 chronic kidney disease, or unspecified chronic kidney disease: Secondary | ICD-10-CM | POA: Diagnosis not present

## 2018-05-02 DIAGNOSIS — M150 Primary generalized (osteo)arthritis: Secondary | ICD-10-CM | POA: Diagnosis not present

## 2018-05-02 DIAGNOSIS — I5032 Chronic diastolic (congestive) heart failure: Secondary | ICD-10-CM | POA: Diagnosis not present

## 2018-05-02 DIAGNOSIS — N400 Enlarged prostate without lower urinary tract symptoms: Secondary | ICD-10-CM | POA: Diagnosis not present

## 2018-05-02 DIAGNOSIS — R010 Benign and innocent cardiac murmurs: Secondary | ICD-10-CM | POA: Diagnosis not present

## 2018-05-02 DIAGNOSIS — M0589 Other rheumatoid arthritis with rheumatoid factor of multiple sites: Secondary | ICD-10-CM | POA: Diagnosis not present

## 2018-05-15 DIAGNOSIS — M4120 Other idiopathic scoliosis, site unspecified: Secondary | ICD-10-CM | POA: Diagnosis not present

## 2018-05-15 DIAGNOSIS — M0589 Other rheumatoid arthritis with rheumatoid factor of multiple sites: Secondary | ICD-10-CM | POA: Diagnosis not present

## 2018-05-15 DIAGNOSIS — R42 Dizziness and giddiness: Secondary | ICD-10-CM | POA: Diagnosis not present

## 2018-05-15 DIAGNOSIS — Z79899 Other long term (current) drug therapy: Secondary | ICD-10-CM | POA: Diagnosis not present

## 2018-05-21 DIAGNOSIS — R972 Elevated prostate specific antigen [PSA]: Secondary | ICD-10-CM | POA: Diagnosis not present

## 2018-06-02 ENCOUNTER — Telehealth: Payer: Self-pay | Admitting: Cardiovascular Disease

## 2018-06-03 ENCOUNTER — Telehealth (INDEPENDENT_AMBULATORY_CARE_PROVIDER_SITE_OTHER): Payer: Medicare Other | Admitting: Cardiovascular Disease

## 2018-06-03 ENCOUNTER — Encounter: Payer: Self-pay | Admitting: Cardiovascular Disease

## 2018-06-03 DIAGNOSIS — I10 Essential (primary) hypertension: Secondary | ICD-10-CM | POA: Diagnosis not present

## 2018-06-03 DIAGNOSIS — I480 Paroxysmal atrial fibrillation: Secondary | ICD-10-CM

## 2018-06-03 NOTE — Progress Notes (Signed)
Virtual Visit via Telephone Note   This visit type was conducted due to national recommendations for restrictions regarding the COVID-19 Pandemic (e.g. social distancing) in an effort to limit this patient's exposure and mitigate transmission in our community.  Due to his co-morbid illnesses, this patient is at least at moderate risk for complications without adequate follow up.  This format is felt to be most appropriate for this patient at this time.  The patient did not have access to video technology/had technical difficulties with video requiring transitioning to audio format only (telephone).  All issues noted in this document were discussed and addressed.  No physical exam could be performed with this format.  Please refer to the patient's chart for his  consent to telehealth for [facility].   Date:  06/03/2018   ID:  Cory Gonzalez, DOB 01-23-1930, MRN [id number]  Patient Location: Home Provider Location: Office  PCP:  [facility] [place]  Cardiologist:  Skeet Latch, MD  Electrophysiologist:  None   Evaluation Performed:  Follow-Up Visit  Chief Complaint:  Follow up  History of Present Illness:    Cory Gonzalez is a 85 y.o. male with hypertension, chronic diastolic heart failure, paroxysmal atrial fibrillation, COPD, CKD 3 and RA who presents for follow up.   Cory Gonzalez saw his PCP, Cory Gonzalez on 04/12/15 and was noted to be in atrial fibrillation with a heart rate of 56. He was started on Xarelto and scheduled to follow-up with cardiology.  He was seen in clinic 04/2015 and referred for an echo that revealed LVEF 60-65% with grade 1 diastolic dysfunction.  At that appointment he also reported episodes of intermittent atypical chest pain and was referred for [facility] that was negative for ischemia.  Cory Gonzalez underwent surgery on his elbow 08/2017.  That hospitalization was complicated by a R pneumothorax that occurred in the setting of getting a nerve  block.  He had to have a chest tube placed twice.  He also had his car trunk fall on his hand.  This was comp gated by bleeding and infection that required antibiotics.  At his last appointment he complained of intermittent dizziness. Lately he has been feeling well.  He denies chest pain or shortness of breath.  He feels unsteady on his feet but denies recent dizziness. He did PT for gait training which helped.  He doesn't like walking with his walker or cane and denies recent falls.  He denies palpitations but sometimes has episodes of fatigue that seem to occur randomly.  He wonders if this is atrial fibrillation.  There is no associated chest pain or shortness of breath and he sometimes notes it after working in his yard.  His PSA was elevated 03/2018 but he saw urology and is not interested in any additional testing or treatment due to his age and the fact that he feels well.   The patient does not have symptoms concerning for COVID-19 infection (fever, chills, cough, or new shortness of breath).    Past Medical History:  Diagnosis Date  . Chronic diastolic heart failure ([place])   . Heart murmur    since childhood, never has had any problems  . Hypertension   . Paroxysmal atrial fibrillation (HCC)   . Pneumonia   . PONV (postoperative nausea and vomiting)    a little nausea  . Rheumatoid arthritis [facility])    Past Surgical History:  Procedure Laterality Date  . CATARACT EXTRACTION Bilateral   . COLONOSCOPY    .  ORIF ELBOW FRACTURE Right 09/18/2017   Procedure: OPEN REDUCTION INTERNAL FIXATION (ORIF) RIGHT ELBOW/OLECRANON FRACTURE;  Surgeon: [name], MD;  Location: [place];  Service: Orthopedics;  Laterality: Right;  . PROSTATE SURGERY       Current Meds  Medication Sig  . amLODipine (NORVASC) 10 MG tablet Take 10 mg by mouth daily.   . folic acid (FOLVITE) 1 MG tablet Take 2 mg by mouth daily.   . InFLIXimab (REMICADE IV) Inject into the vein every 8 (eight) weeks.  [name] Kitchen latanoprost  (XALATAN) 0.005 % ophthalmic solution Place 1 drop into both eyes at bedtime.   [name] Kitchen lisinopril-hydrochlorothiazide (PRINZIDE,ZESTORETIC) 20-12.5 MG tablet Take 1 tablet by mouth daily.  . methotrexate (RHEUMATREX) 2.5 MG tablet Take 10 mg by mouth every Friday.   . Multiple Vitamin (MULITIVITAMIN WITH MINERALS) TABS Take 1 tablet by mouth daily.  . predniSONE (DELTASONE) 5 MG tablet Take 5 mg by mouth daily with breakfast.  . rivaroxaban (XARELTO) 15 MG TABS tablet Take 1 tablet (15 mg total) by mouth daily with supper.     Allergies:   Patient has no known allergies.   Social History   Tobacco Use  . Smoking status: Current Every Day Smoker    Years: 50.00    Types: Pipe  . Smokeless tobacco: Former Network engineer Use Topics  . Alcohol use: No    Alcohol/week: 0.0 standard drinks  . Drug use: No     Family Hx: The patient's family history includes Aneurysm in his brother; COPD in his sister; Dementia in his sister.  ROS:   Please see the history of present illness.     All other systems reviewed and are negative.   Prior CV studies:   The following studies were reviewed today:  Echo 05/13/15: LVEF 60-65%.  Grade 1 diastolic dysfunction.  Lexiscan Myoview 05/13/15:   Normal Lexiscan myoview study  There was no ST segment deviation noted during stress.  The study is normal.  This is a low risk study.  Nuclear stress EF: 65%. Normal LV function   Labs/Other Tests and Data Reviewed:    EKG:  No ECG reviewed.  Recent Labs: 09/18/2017: Hemoglobin 12.9; Platelets 243 09/21/2017: BUN 17; Creatinine, Ser 0.98; Potassium 3.6; Sodium 138   Recent Lipid Panel No results found for: CHOL, TRIG, HDL, CHOLHDL, LDLCALC, LDLDIRECT  Wt Readings from Last 3 Encounters:  06/03/18 140 lb (63.5 kg)  12/23/17 144 lb 6.4 oz (65.5 kg)  11/06/17 141 lb (64 kg)     Objective:    BP 120/82   Pulse (!) 54   Ht 5\' 5"  (1.651 m)   Wt 140 lb (63.5 kg)   BMI 23.30 kg/m  GENERAL:  Sounds well.  No acute distress. NEURO:  Speech fluent.  PSYCH:  Cognitively intact, oriented to person place and time   ASSESSMENT & PLAN:    # Paroxysmal atrial fibrillation:   He has occasional episodes of fatigue that may represent atrial fibrillation.  He is not interested in wearing a monitor again.  He has not had any known recurrent episodes of atrial fibrillation.  He was asymptomatic when in atrial fibrillation. Continue Xarelto.  Given his frequent falls I would rather error on the side of less anticoagulation.  Encouraged him to use his cane when walking.  No beta blocker 2/2 bradycardia.  This patients CHA2DS2-VASc Score and unadjusted Ischemic Stroke Rate (% per year) is equal to 3.2 % stroke rate/year from a score of  3 Above score calculated as 1 point each if present [CHF, HTN, DM, Vascular=MI/PAD/Aortic Plaque, Age if 65-74, or Male] Above score calculated as 2 points each if present [Age > 75, or Stroke/TIA/TE]  # Murmur: Stable and lifelong.  No significant valvular disease on echo in 2017.  # Hypertension: Blood pressure well-controlled.  Continue lisinopril/HCTZ and amlodipine.   COVID-19 Education: The signs and symptoms of COVID-19 were discussed with the patient and how to seek care for testing (follow up with PCP or arrange E-visit).  The importance of social distancing was discussed today.  Time:   Today, I have spent 15 minutes with the patient with telehealth technology discussing the above problems.     Medication Adjustments/Labs and Tests Ordered: Current medicines are reviewed at length with the patient today.  Concerns regarding medicines are outlined above.   Tests Ordered: No orders of the defined types were placed in this encounter.   Medication Changes: No orders of the defined types were placed in this encounter.   Disposition:  Follow up in 6 month(s)  Signed, Skeet Latch, MD  06/03/2018 1:24 PM    [facility]

## 2018-06-03 NOTE — Patient Instructions (Addendum)
Medication Instructions:  Your physician recommends that you continue on your current medications as directed. Please refer to the Current Medication list given to you today.  If you need a refill on your cardiac medications before your next appointment, please call your pharmacy.   Lab work: NONE  Testing/Procedures: NONE   Follow-Up: At [facility], you and your health needs are our priority.  As part of our continuing mission to provide you with exceptional heart care, we have created designated Provider Care Teams.  These Care Teams include your primary Cardiologist (physician) and Advanced Practice Providers (APPs -  Physician Assistants and Nurse Practitioners) who all work together to provide you with the care you need, when you need it. You will need a follow up appointment in 6 months.  Please call our office 2 months in advance to schedule this appointment.  You may see Lossie Kalp [place], MD or one of the following Advanced Practice Providers on your designated Care Team:   Luke Kilroy, PA-C Krista Kroeger, PA-C . Callie Goodrich, PA-C   

## 2018-06-05 DIAGNOSIS — H5203 Hypermetropia, bilateral: Secondary | ICD-10-CM | POA: Diagnosis not present

## 2018-06-05 DIAGNOSIS — H524 Presbyopia: Secondary | ICD-10-CM | POA: Diagnosis not present

## 2018-06-05 DIAGNOSIS — Z961 Presence of intraocular lens: Secondary | ICD-10-CM | POA: Diagnosis not present

## 2018-06-05 DIAGNOSIS — H0100A Unspecified blepharitis right eye, upper and lower eyelids: Secondary | ICD-10-CM | POA: Diagnosis not present

## 2018-06-05 DIAGNOSIS — H0100B Unspecified blepharitis left eye, upper and lower eyelids: Secondary | ICD-10-CM | POA: Diagnosis not present

## 2018-06-05 DIAGNOSIS — H52203 Unspecified astigmatism, bilateral: Secondary | ICD-10-CM | POA: Diagnosis not present

## 2018-06-05 DIAGNOSIS — H40053 Ocular hypertension, bilateral: Secondary | ICD-10-CM | POA: Diagnosis not present

## 2018-06-06 DIAGNOSIS — M0589 Other rheumatoid arthritis with rheumatoid factor of multiple sites: Secondary | ICD-10-CM | POA: Diagnosis not present

## 2018-06-19 ENCOUNTER — Telehealth: Payer: Medicare Other | Admitting: Cardiovascular Disease

## 2018-07-04 DIAGNOSIS — Z961 Presence of intraocular lens: Secondary | ICD-10-CM | POA: Diagnosis not present

## 2018-07-04 DIAGNOSIS — H40013 Open angle with borderline findings, low risk, bilateral: Secondary | ICD-10-CM | POA: Diagnosis not present

## 2018-07-28 NOTE — Telephone Encounter (Signed)
Opened in error

## 2018-08-08 DIAGNOSIS — M0589 Other rheumatoid arthritis with rheumatoid factor of multiple sites: Secondary | ICD-10-CM | POA: Diagnosis not present

## 2018-08-27 ENCOUNTER — Other Ambulatory Visit: Payer: Self-pay

## 2018-09-18 DIAGNOSIS — R7612 Nonspecific reaction to cell mediated immunity measurement of gamma interferon antigen response without active tuberculosis: Secondary | ICD-10-CM | POA: Diagnosis not present

## 2018-09-18 DIAGNOSIS — I10 Essential (primary) hypertension: Secondary | ICD-10-CM | POA: Diagnosis not present

## 2018-09-18 DIAGNOSIS — M4120 Other idiopathic scoliosis, site unspecified: Secondary | ICD-10-CM | POA: Diagnosis not present

## 2018-09-18 DIAGNOSIS — M1990 Unspecified osteoarthritis, unspecified site: Secondary | ICD-10-CM | POA: Diagnosis not present

## 2018-09-18 DIAGNOSIS — N183 Chronic kidney disease, stage 3 (moderate): Secondary | ICD-10-CM | POA: Diagnosis not present

## 2018-09-18 DIAGNOSIS — Z79899 Other long term (current) drug therapy: Secondary | ICD-10-CM | POA: Diagnosis not present

## 2018-09-18 DIAGNOSIS — R42 Dizziness and giddiness: Secondary | ICD-10-CM | POA: Diagnosis not present

## 2018-09-18 DIAGNOSIS — M0589 Other rheumatoid arthritis with rheumatoid factor of multiple sites: Secondary | ICD-10-CM | POA: Diagnosis not present

## 2018-09-24 DIAGNOSIS — R972 Elevated prostate specific antigen [PSA]: Secondary | ICD-10-CM | POA: Diagnosis not present

## 2018-10-01 DIAGNOSIS — R972 Elevated prostate specific antigen [PSA]: Secondary | ICD-10-CM | POA: Diagnosis not present

## 2018-10-03 DIAGNOSIS — M0589 Other rheumatoid arthritis with rheumatoid factor of multiple sites: Secondary | ICD-10-CM | POA: Diagnosis not present

## 2018-10-30 DIAGNOSIS — I10 Essential (primary) hypertension: Secondary | ICD-10-CM | POA: Diagnosis not present

## 2018-10-30 DIAGNOSIS — I129 Hypertensive chronic kidney disease with stage 1 through stage 4 chronic kidney disease, or unspecified chronic kidney disease: Secondary | ICD-10-CM | POA: Diagnosis not present

## 2018-10-30 DIAGNOSIS — I5032 Chronic diastolic (congestive) heart failure: Secondary | ICD-10-CM | POA: Diagnosis not present

## 2018-11-06 DIAGNOSIS — Z23 Encounter for immunization: Secondary | ICD-10-CM | POA: Diagnosis not present

## 2018-11-06 DIAGNOSIS — F17290 Nicotine dependence, other tobacco product, uncomplicated: Secondary | ICD-10-CM | POA: Diagnosis not present

## 2018-11-06 DIAGNOSIS — J449 Chronic obstructive pulmonary disease, unspecified: Secondary | ICD-10-CM | POA: Diagnosis not present

## 2018-11-06 DIAGNOSIS — N183 Chronic kidney disease, stage 3 unspecified: Secondary | ICD-10-CM | POA: Diagnosis not present

## 2018-11-06 DIAGNOSIS — M0589 Other rheumatoid arthritis with rheumatoid factor of multiple sites: Secondary | ICD-10-CM | POA: Diagnosis not present

## 2018-11-06 DIAGNOSIS — M150 Primary generalized (osteo)arthritis: Secondary | ICD-10-CM | POA: Diagnosis not present

## 2018-11-06 DIAGNOSIS — I129 Hypertensive chronic kidney disease with stage 1 through stage 4 chronic kidney disease, or unspecified chronic kidney disease: Secondary | ICD-10-CM | POA: Diagnosis not present

## 2018-11-06 DIAGNOSIS — R010 Benign and innocent cardiac murmurs: Secondary | ICD-10-CM | POA: Diagnosis not present

## 2018-11-06 DIAGNOSIS — I5032 Chronic diastolic (congestive) heart failure: Secondary | ICD-10-CM | POA: Diagnosis not present

## 2018-11-06 DIAGNOSIS — I4891 Unspecified atrial fibrillation: Secondary | ICD-10-CM | POA: Diagnosis not present

## 2018-11-06 DIAGNOSIS — M25519 Pain in unspecified shoulder: Secondary | ICD-10-CM | POA: Diagnosis not present

## 2018-11-26 ENCOUNTER — Encounter: Payer: Self-pay | Admitting: *Deleted

## 2018-11-28 DIAGNOSIS — M1990 Unspecified osteoarthritis, unspecified site: Secondary | ICD-10-CM | POA: Diagnosis not present

## 2018-11-28 DIAGNOSIS — M7552 Bursitis of left shoulder: Secondary | ICD-10-CM | POA: Diagnosis not present

## 2018-11-28 DIAGNOSIS — Z79899 Other long term (current) drug therapy: Secondary | ICD-10-CM | POA: Diagnosis not present

## 2018-11-28 DIAGNOSIS — M0589 Other rheumatoid arthritis with rheumatoid factor of multiple sites: Secondary | ICD-10-CM | POA: Diagnosis not present

## 2018-11-28 DIAGNOSIS — N183 Chronic kidney disease, stage 3 unspecified: Secondary | ICD-10-CM | POA: Diagnosis not present

## 2018-11-28 DIAGNOSIS — I10 Essential (primary) hypertension: Secondary | ICD-10-CM | POA: Diagnosis not present

## 2018-11-28 DIAGNOSIS — R7612 Nonspecific reaction to cell mediated immunity measurement of gamma interferon antigen response without active tuberculosis: Secondary | ICD-10-CM | POA: Diagnosis not present

## 2018-11-28 DIAGNOSIS — M4120 Other idiopathic scoliosis, site unspecified: Secondary | ICD-10-CM | POA: Diagnosis not present

## 2018-12-01 DIAGNOSIS — H40053 Ocular hypertension, bilateral: Secondary | ICD-10-CM | POA: Diagnosis not present

## 2018-12-01 DIAGNOSIS — H04123 Dry eye syndrome of bilateral lacrimal glands: Secondary | ICD-10-CM | POA: Diagnosis not present

## 2018-12-11 ENCOUNTER — Other Ambulatory Visit: Payer: Self-pay

## 2018-12-17 DIAGNOSIS — L570 Actinic keratosis: Secondary | ICD-10-CM | POA: Diagnosis not present

## 2018-12-17 DIAGNOSIS — Z08 Encounter for follow-up examination after completed treatment for malignant neoplasm: Secondary | ICD-10-CM | POA: Diagnosis not present

## 2018-12-17 DIAGNOSIS — X32XXXD Exposure to sunlight, subsequent encounter: Secondary | ICD-10-CM | POA: Diagnosis not present

## 2018-12-17 DIAGNOSIS — Z85828 Personal history of other malignant neoplasm of skin: Secondary | ICD-10-CM | POA: Diagnosis not present

## 2018-12-30 DIAGNOSIS — M7552 Bursitis of left shoulder: Secondary | ICD-10-CM | POA: Diagnosis not present

## 2018-12-30 DIAGNOSIS — R7612 Nonspecific reaction to cell mediated immunity measurement of gamma interferon antigen response without active tuberculosis: Secondary | ICD-10-CM | POA: Diagnosis not present

## 2018-12-30 DIAGNOSIS — M1990 Unspecified osteoarthritis, unspecified site: Secondary | ICD-10-CM | POA: Diagnosis not present

## 2018-12-30 DIAGNOSIS — Z79899 Other long term (current) drug therapy: Secondary | ICD-10-CM | POA: Diagnosis not present

## 2018-12-30 DIAGNOSIS — N183 Chronic kidney disease, stage 3 unspecified: Secondary | ICD-10-CM | POA: Diagnosis not present

## 2018-12-30 DIAGNOSIS — M4120 Other idiopathic scoliosis, site unspecified: Secondary | ICD-10-CM | POA: Diagnosis not present

## 2018-12-30 DIAGNOSIS — M0589 Other rheumatoid arthritis with rheumatoid factor of multiple sites: Secondary | ICD-10-CM | POA: Diagnosis not present

## 2018-12-30 DIAGNOSIS — I10 Essential (primary) hypertension: Secondary | ICD-10-CM | POA: Diagnosis not present

## 2019-03-26 DIAGNOSIS — M0589 Other rheumatoid arthritis with rheumatoid factor of multiple sites: Secondary | ICD-10-CM | POA: Diagnosis not present

## 2019-03-31 DIAGNOSIS — N183 Chronic kidney disease, stage 3 unspecified: Secondary | ICD-10-CM | POA: Diagnosis not present

## 2019-03-31 DIAGNOSIS — M1990 Unspecified osteoarthritis, unspecified site: Secondary | ICD-10-CM | POA: Diagnosis not present

## 2019-03-31 DIAGNOSIS — Z79899 Other long term (current) drug therapy: Secondary | ICD-10-CM | POA: Diagnosis not present

## 2019-03-31 DIAGNOSIS — R7612 Nonspecific reaction to cell mediated immunity measurement of gamma interferon antigen response without active tuberculosis: Secondary | ICD-10-CM | POA: Diagnosis not present

## 2019-03-31 DIAGNOSIS — M0589 Other rheumatoid arthritis with rheumatoid factor of multiple sites: Secondary | ICD-10-CM | POA: Diagnosis not present

## 2019-03-31 DIAGNOSIS — I10 Essential (primary) hypertension: Secondary | ICD-10-CM | POA: Diagnosis not present

## 2019-03-31 DIAGNOSIS — M4120 Other idiopathic scoliosis, site unspecified: Secondary | ICD-10-CM | POA: Diagnosis not present

## 2019-04-30 DIAGNOSIS — I129 Hypertensive chronic kidney disease with stage 1 through stage 4 chronic kidney disease, or unspecified chronic kidney disease: Secondary | ICD-10-CM | POA: Diagnosis not present

## 2019-04-30 DIAGNOSIS — I10 Essential (primary) hypertension: Secondary | ICD-10-CM | POA: Diagnosis not present

## 2019-04-30 DIAGNOSIS — Z1329 Encounter for screening for other suspected endocrine disorder: Secondary | ICD-10-CM | POA: Diagnosis not present

## 2019-04-30 DIAGNOSIS — Z0000 Encounter for general adult medical examination without abnormal findings: Secondary | ICD-10-CM | POA: Diagnosis not present

## 2019-05-07 DIAGNOSIS — F17290 Nicotine dependence, other tobacco product, uncomplicated: Secondary | ICD-10-CM | POA: Diagnosis not present

## 2019-05-07 DIAGNOSIS — R7612 Nonspecific reaction to cell mediated immunity measurement of gamma interferon antigen response without active tuberculosis: Secondary | ICD-10-CM | POA: Diagnosis not present

## 2019-05-07 DIAGNOSIS — M069 Rheumatoid arthritis, unspecified: Secondary | ICD-10-CM | POA: Diagnosis not present

## 2019-05-07 DIAGNOSIS — I5032 Chronic diastolic (congestive) heart failure: Secondary | ICD-10-CM | POA: Diagnosis not present

## 2019-05-07 DIAGNOSIS — N400 Enlarged prostate without lower urinary tract symptoms: Secondary | ICD-10-CM | POA: Diagnosis not present

## 2019-05-07 DIAGNOSIS — I129 Hypertensive chronic kidney disease with stage 1 through stage 4 chronic kidney disease, or unspecified chronic kidney disease: Secondary | ICD-10-CM | POA: Diagnosis not present

## 2019-05-07 DIAGNOSIS — M0589 Other rheumatoid arthritis with rheumatoid factor of multiple sites: Secondary | ICD-10-CM | POA: Diagnosis not present

## 2019-05-07 DIAGNOSIS — M1990 Unspecified osteoarthritis, unspecified site: Secondary | ICD-10-CM | POA: Diagnosis not present

## 2019-05-07 DIAGNOSIS — R010 Benign and innocent cardiac murmurs: Secondary | ICD-10-CM | POA: Diagnosis not present

## 2019-05-07 DIAGNOSIS — J449 Chronic obstructive pulmonary disease, unspecified: Secondary | ICD-10-CM | POA: Diagnosis not present

## 2019-05-07 DIAGNOSIS — I4891 Unspecified atrial fibrillation: Secondary | ICD-10-CM | POA: Diagnosis not present

## 2019-05-07 DIAGNOSIS — N183 Chronic kidney disease, stage 3 unspecified: Secondary | ICD-10-CM | POA: Diagnosis not present

## 2019-05-20 DIAGNOSIS — H35373 Puckering of macula, bilateral: Secondary | ICD-10-CM | POA: Diagnosis not present

## 2019-05-20 DIAGNOSIS — Z961 Presence of intraocular lens: Secondary | ICD-10-CM | POA: Diagnosis not present

## 2019-05-20 DIAGNOSIS — H26492 Other secondary cataract, left eye: Secondary | ICD-10-CM | POA: Diagnosis not present

## 2019-05-20 DIAGNOSIS — H40053 Ocular hypertension, bilateral: Secondary | ICD-10-CM | POA: Diagnosis not present

## 2019-05-26 DIAGNOSIS — M0589 Other rheumatoid arthritis with rheumatoid factor of multiple sites: Secondary | ICD-10-CM | POA: Diagnosis not present

## 2019-06-09 DIAGNOSIS — H0100A Unspecified blepharitis right eye, upper and lower eyelids: Secondary | ICD-10-CM | POA: Diagnosis not present

## 2019-06-09 DIAGNOSIS — H26492 Other secondary cataract, left eye: Secondary | ICD-10-CM | POA: Diagnosis not present

## 2019-06-09 DIAGNOSIS — H40053 Ocular hypertension, bilateral: Secondary | ICD-10-CM | POA: Diagnosis not present

## 2019-06-09 DIAGNOSIS — H04123 Dry eye syndrome of bilateral lacrimal glands: Secondary | ICD-10-CM | POA: Diagnosis not present

## 2019-06-09 DIAGNOSIS — Z961 Presence of intraocular lens: Secondary | ICD-10-CM | POA: Diagnosis not present

## 2019-06-09 DIAGNOSIS — H0100B Unspecified blepharitis left eye, upper and lower eyelids: Secondary | ICD-10-CM | POA: Diagnosis not present

## 2019-07-04 ENCOUNTER — Encounter (HOSPITAL_BASED_OUTPATIENT_CLINIC_OR_DEPARTMENT_OTHER): Payer: Self-pay | Admitting: Emergency Medicine

## 2019-07-04 ENCOUNTER — Other Ambulatory Visit: Payer: Self-pay

## 2019-07-04 ENCOUNTER — Emergency Department (HOSPITAL_BASED_OUTPATIENT_CLINIC_OR_DEPARTMENT_OTHER)
Admission: EM | Admit: 2019-07-04 | Discharge: 2019-07-04 | Disposition: A | Discharge status: Home / Self Care | Payer: Medicare Other | Attending: Emergency Medicine | Admitting: Emergency Medicine

## 2019-07-04 DIAGNOSIS — S30861A Insect bite (nonvenomous) of abdominal wall, initial encounter: Secondary | ICD-10-CM | POA: Insufficient documentation

## 2019-07-04 DIAGNOSIS — Y999 Unspecified external cause status: Secondary | ICD-10-CM | POA: Insufficient documentation

## 2019-07-04 DIAGNOSIS — Y939 Activity, unspecified: Secondary | ICD-10-CM | POA: Insufficient documentation

## 2019-07-04 DIAGNOSIS — W57XXXA Bitten or stung by nonvenomous insect and other nonvenomous arthropods, initial encounter: Secondary | ICD-10-CM | POA: Insufficient documentation

## 2019-07-04 DIAGNOSIS — F17200 Nicotine dependence, unspecified, uncomplicated: Secondary | ICD-10-CM | POA: Diagnosis not present

## 2019-07-04 DIAGNOSIS — Y929 Unspecified place or not applicable: Secondary | ICD-10-CM | POA: Insufficient documentation

## 2019-07-04 DIAGNOSIS — I10 Essential (primary) hypertension: Secondary | ICD-10-CM | POA: Insufficient documentation

## 2019-07-04 DIAGNOSIS — R6883 Chills (without fever): Secondary | ICD-10-CM | POA: Diagnosis not present

## 2019-07-04 LAB — CBC WITH DIFFERENTIAL/PLATELET
Abs Immature Granulocytes: 0.05 10*3/uL (ref 0.00–0.07)
Basophils Absolute: 0 10*3/uL (ref 0.0–0.1)
Basophils Relative: 0 %
Eosinophils Absolute: 0 10*3/uL (ref 0.0–0.5)
Eosinophils Relative: 0 %
HCT: 42.4 % (ref 39.0–52.0)
Hemoglobin: 14.5 g/dL (ref 13.0–17.0)
Immature Granulocytes: 1 %
Lymphocytes Relative: 8 %
Lymphs Abs: 0.6 10*3/uL — ABNORMAL LOW (ref 0.7–4.0)
MCH: 35.5 pg — ABNORMAL HIGH (ref 26.0–34.0)
MCHC: 34.2 g/dL (ref 30.0–36.0)
MCV: 103.7 fL — ABNORMAL HIGH (ref 80.0–100.0)
Monocytes Absolute: 0.8 10*3/uL (ref 0.1–1.0)
Monocytes Relative: 11 %
Neutro Abs: 5.6 10*3/uL (ref 1.7–7.7)
Neutrophils Relative %: 80 %
Platelets: 251 10*3/uL (ref 150–400)
RBC: 4.09 MIL/uL — ABNORMAL LOW (ref 4.22–5.81)
RDW: 14.5 % (ref 11.5–15.5)
WBC: 7.1 10*3/uL (ref 4.0–10.5)
nRBC: 0 % (ref 0.0–0.2)

## 2019-07-04 LAB — BASIC METABOLIC PANEL
Anion gap: 10 (ref 5–15)
BUN: 14 mg/dL (ref 8–23)
CO2: 22 mmol/L (ref 22–32)
Calcium: 8.9 mg/dL (ref 8.9–10.3)
Chloride: 101 mmol/L (ref 98–111)
Creatinine, Ser: 1 mg/dL (ref 0.61–1.24)
GFR calc Af Amer: >60 mL/min (ref >60)
GFR calc non Af Amer: >60 mL/min (ref >60)
Glucose, Bld: 117 mg/dL — ABNORMAL HIGH (ref 70–99)
Potassium: 4 mmol/L (ref 3.5–5.1)
Sodium: 133 mmol/L — ABNORMAL LOW (ref 135–145)

## 2019-07-04 MED ORDER — DOXYCYCLINE HYCLATE 100 MG PO TABS
200.0000 mg | ORAL_TABLET | Freq: Once | ORAL | Status: AC
  Administered 2019-07-04: 200 mg via ORAL
  Filled 2019-07-04: qty 2

## 2019-07-04 NOTE — Discharge Instructions (Addendum)
Recommend recheck with primary doctor this coming week.  If you develop fever, rash, chest pain, difficulty in breathing, abdominal pain or vomiting, recommend return to ER for reassessment.

## 2019-07-04 NOTE — ED Triage Notes (Signed)
Chills since yesterday. He had a tick on his stomach on Wednesday. Denies pain

## 2019-07-04 NOTE — ED Notes (Signed)
[facility] ED nursing note: states has no appetite, found a tick at left upper abd on wed, wife removed tick, pt states has had a fever and note felt well since wed

## 2019-07-04 NOTE — ED Provider Notes (Signed)
[facility] EMERGENCY DEPARTMENT Provider Note   CSN: [id number] Arrival date & time: 07/04/19  1031     History Chief Complaint  Patient presents with  . Chills    Cory Gonzalez is a 86 y.o. male.  Presents ER with concern for tick bite.  Patient states that wife noticed a tick on his stomach on Wednesday and she removed it, thinks that she was able to completely remove it however noted a small area of redness around the site.  Left upper quadrant of abdomen.  Felt like he has some chills, states his temperature normally is 96-97 but was up to 73F at home and here in ER.  No cough, shortness of breath, vomiting, abdominal pain, dysuria.  Otherwise feels well.  HPI     Past Medical History:  Diagnosis Date  . Chronic diastolic heart failure ([place])   . Heart murmur    since childhood, never has had any problems  . Hypertension   . Paroxysmal atrial fibrillation (HCC)   . Pneumonia   . PONV (postoperative nausea and vomiting)    a little nausea  . Rheumatoid arthritis [facility])     Patient Active Problem List   Diagnosis Date Noted  . Pneumothorax on right 09/19/2017  . Olecranon fracture 09/18/2017    Past Surgical History:  Procedure Laterality Date  . CATARACT EXTRACTION Bilateral   . COLONOSCOPY    . ORIF ELBOW FRACTURE Right 09/18/2017   Procedure: OPEN REDUCTION INTERNAL FIXATION (ORIF) RIGHT ELBOW/OLECRANON FRACTURE;  Surgeon: [name], MD;  Location: [place];  Service: Orthopedics;  Laterality: Right;  . PROSTATE SURGERY         Family History  Problem Relation Age of Onset  . COPD Sister   . Dementia Sister   . Aneurysm Brother     Social History   Tobacco Use  . Smoking status: Current Every Day Smoker    Years: 50.00    Types: Pipe  . Smokeless tobacco: Former Network engineer Use Topics  . Alcohol use: No    Alcohol/week: 0.0 standard drinks  . Drug use: No    Home Medications Prior to Admission medications   Medication Sig Start  Date End Date Taking? Authorizing Provider  amLODipine (NORVASC) 10 MG tablet Take 10 mg by mouth daily.  08/06/11   [provider]  folic acid (FOLVITE) 1 MG tablet Take 2 mg by mouth daily.     [provider]  InFLIXimab (REMICADE IV) Inject into the vein every 8 (eight) weeks.    [provider]  latanoprost (XALATAN) 0.005 % ophthalmic solution Place 1 drop into both eyes at bedtime.  11/01/14   [provider]  lisinopril-hydrochlorothiazide (PRINZIDE,ZESTORETIC) 20-12.5 MG tablet Take 1 tablet by mouth daily. 11/03/15   [provider]  methotrexate (RHEUMATREX) 2.5 MG tablet Take 10 mg by mouth every Friday.  10/06/14   [provider]  Multiple Vitamin (MULITIVITAMIN WITH MINERALS) TABS Take 1 tablet by mouth daily.    [provider]  predniSONE (DELTASONE) 5 MG tablet Take 5 mg by mouth daily with breakfast.    [provider]  rivaroxaban (XARELTO) 15 MG TABS tablet Take 1 tablet (15 mg total) by mouth daily with supper. 09/17/17   Skeet Latch, MD    Allergies    Patient has no known allergies.  Review of Systems   Review of Systems  Constitutional: Positive for chills. Negative for fever.  HENT: Negative for ear  pain and sore throat.   Eyes: Negative for pain and visual disturbance.  Respiratory: Negative for cough and shortness of breath.   Cardiovascular: Negative for chest pain and palpitations.  Gastrointestinal: Negative for abdominal pain and vomiting.  Genitourinary: Negative for dysuria and hematuria.  Musculoskeletal: Negative for arthralgias and back pain.  Skin: Negative for color change and rash.  Neurological: Negative for seizures and syncope.  All other systems reviewed and are negative.   Physical Exam Updated Vital Signs BP 125/79 (BP Location: Left Arm)   Pulse 65   Temp 98.7 F (37.1 C) (Oral)   Resp 16   Ht 5\' 5"  (1.651 m)   Wt 59 kg   SpO2 95%   BMI 21.63 kg/m    Physical Exam Vitals and nursing note reviewed.  Constitutional:      Appearance: He is well-developed.  HENT:     Head: Normocephalic and atraumatic.  Eyes:     Conjunctiva/sclera: Conjunctivae normal.  Cardiovascular:     Rate and Rhythm: Normal rate and regular rhythm.     Heart sounds: No murmur.  Pulmonary:     Effort: Pulmonary effort is normal. No respiratory distress.     Breath sounds: Normal breath sounds.  Abdominal:     General: Abdomen is flat.     Palpations: Abdomen is soft.     Tenderness: There is no abdominal tenderness. There is no guarding.     Hernia: No hernia is present.  Musculoskeletal:        General: No deformity or signs of injury.     Cervical back: Neck supple.  Skin:    General: Skin is warm and dry.     Comments: There is 0.5 cm area of erythema on left upper quadrant, no underlying induration, no tenderness, no drainage, no retained foreign body noted  Neurological:     General: No focal deficit present.     Mental Status: He is alert.     ED Results / Procedures / Treatments   Labs (all labs ordered are listed, but only abnormal results are displayed) Labs Reviewed  CBC WITH DIFFERENTIAL/PLATELET - Abnormal; Notable for the following components:      Result Value   RBC 4.09 (*)    MCV 103.7 (*)    MCH 35.5 (*)    Lymphs Abs 0.6 (*)    All other components within normal limits  BASIC METABOLIC PANEL - Abnormal; Notable for the following components:   Sodium 133 (*)    Glucose, Bld 117 (*)    All other components within normal limits    EKG None  Radiology No results found.  Procedures Procedures (including critical care time)  Medications Ordered in ED Medications  doxycycline (VIBRA-TABS) tablet 200 mg (200 mg Oral Given 07/04/19 1309)    ED Course  I have reviewed the triage vital signs and the nursing notes.  Pertinent labs & imaging results that were available during my care of the patient were reviewed by me and  considered in my medical decision making (see chart for details).    MDM Rules/Calculators/A&P                      86 year old male presented to ER with concern for chills as well as recent tick bite.  The area of concern for tick bite appears well, there is a very small area of irritation, doubt overlying cellulitis, no target sign.  Will give prophylaxis with single dose  doxycycline.  Patient was concerned about having an episode of chills, he is afebrile in ER and well-appearing with normal vital signs.  Labs grossly normal limits, noted slight hyponatremia.  Given lack of any other symptoms and well appearance currently, do not feel patient requires additional lab testing, imaging testing.  Recommended recheck with primary doctor, discharged home.   After the discussed management above, the patient was determined to be safe for discharge.  The patient was in agreement with this plan and all questions regarding their care were answered.  ED return precautions were discussed and the patient will return to the ED with any significant worsening of condition.  Final Clinical Impression(s) / ED Diagnoses Final diagnoses:  Chills  Tick bite, initial encounter    Rx / DC Orders ED Discharge Orders    None       Lucrezia Starch, MD 07/04/19 1526

## 2019-07-09 DIAGNOSIS — R6883 Chills (without fever): Secondary | ICD-10-CM | POA: Diagnosis not present

## 2019-07-09 DIAGNOSIS — R3989 Other symptoms and signs involving the genitourinary system: Secondary | ICD-10-CM | POA: Diagnosis not present

## 2019-07-09 DIAGNOSIS — R5383 Other fatigue: Secondary | ICD-10-CM | POA: Diagnosis not present

## 2019-07-09 DIAGNOSIS — S30861A Insect bite (nonvenomous) of abdominal wall, initial encounter: Secondary | ICD-10-CM | POA: Diagnosis not present

## 2019-07-16 DIAGNOSIS — R0602 Shortness of breath: Secondary | ICD-10-CM | POA: Diagnosis not present

## 2019-07-18 DIAGNOSIS — Z20828 Contact with and (suspected) exposure to other viral communicable diseases: Secondary | ICD-10-CM | POA: Diagnosis not present

## 2019-07-27 ENCOUNTER — Telehealth: Payer: Self-pay

## 2019-07-27 ENCOUNTER — Other Ambulatory Visit: Payer: Self-pay

## 2019-07-27 ENCOUNTER — Encounter: Payer: Self-pay | Admitting: Infectious Disease

## 2019-07-27 ENCOUNTER — Ambulatory Visit
Admission: RE | Admit: 2019-07-27 | Discharge: 2019-07-27 | Disposition: A | Discharge status: Home / Self Care | Payer: Medicare Other | Source: Ambulatory Visit | Attending: Infectious Disease | Admitting: Infectious Disease

## 2019-07-27 ENCOUNTER — Ambulatory Visit (HOSPITAL_BASED_OUTPATIENT_CLINIC_OR_DEPARTMENT_OTHER): Payer: Medicare Other | Admitting: Infectious Disease

## 2019-07-27 VITALS — BP 98/65 | HR 80 | Temp 98.0°F | Wt 131.0 lb

## 2019-07-27 DIAGNOSIS — R509 Fever, unspecified: Secondary | ICD-10-CM

## 2019-07-27 DIAGNOSIS — M059 Rheumatoid arthritis with rheumatoid factor, unspecified: Secondary | ICD-10-CM

## 2019-07-27 DIAGNOSIS — W57XXXA Bitten or stung by nonvenomous insect and other nonvenomous arthropods, initial encounter: Secondary | ICD-10-CM | POA: Diagnosis not present

## 2019-07-27 DIAGNOSIS — Z114 Encounter for screening for human immunodeficiency virus [HIV]: Secondary | ICD-10-CM | POA: Diagnosis not present

## 2019-07-27 DIAGNOSIS — Z227 Latent tuberculosis: Secondary | ICD-10-CM

## 2019-07-27 HISTORY — DX: Fever, unspecified: R50.9

## 2019-07-27 HISTORY — DX: Bitten or stung by nonvenomous insect and other nonvenomous arthropods, initial encounter: W57.XXXA

## 2019-07-27 NOTE — Telephone Encounter (Signed)
Spoke with Butch Penny with Cardiovascular and patient has been scheduled for echo on 07/30/19. Patient's wife Enid Derry has been informed of the appointment and directions given. Patient's wife verbalized understanding. No prior authorization needed for echo.  Cory Gonzalez T Brooks Sailors

## 2019-07-27 NOTE — Progress Notes (Addendum)
Subjective:  Reason for infectious disease consult: Fever of unknown origin  Requesting physician Dr. Ashby Dawes   Patient ID: Cory Gonzalez, male    DOB: January 17, 1930, 86 y.o.   MRN: [id number]  HPI   This is an 86 year old Caucasian man who has a past medical history significant for hypertension atrial fibrillation on anticoagulation rheumatoid arthritis receiving infliximab infusions.  He was bitten by a tick that became engorged in his skin on his lower abdomen in early June.  Few days thereafter he became febrile with symptoms of headaches and diffuse myalgias.  He was initially seen at a [facility] urgent care but no further work-up or therapeutic intervention was undertaken on July 04, 2019.  He then went to see his primary care physician who became concerned rightfully so for tickborne infection such as RMSF and Ehrlichia.  He had labs drawn then as well as acute titers for RMSF and Ehrlichia which were both negative.  He was then given doxycycline but failed to improve despite being on doxycycline on a 10-day course.  He did not have dysuria but noticed that his urine color change color while on the.  In the interim I continue to have fevers he had a chest x-ray and was told that he might have pneumonia and was given a prescription for azithromycin.  He then has had his fever defervesced after finishing azithromycin which was 3 days ago.  My read of the chest x-ray report that his wife provided me from his primary care therapy office was regarding some scarring in the lungs and some atelectatic changes.  Note he has been previously treated by my partner Dr. Graylon Good for latent tuberculosis in 2016.  While his fevers have gone away he continues to have malaise and feel profoundly fatigued.  He was tested for COVID-19 at a local [facility] and tested negative.  He and his wife do not have much in the way of contacts other than one other relative who is visited them.  She is not known to be sick.  The patient  does have a a known heart murmur and has had some slight mitral regurgitation noted on transthoracic echocardiogram performed 2017.  Other than fevers and malaise and diffuse myalgias he has no specific symptoms associated with the fevers that he had.  He kept a temperature log which he brought with him today to clinic.    Past Medical History:  Diagnosis Date  . Chronic diastolic heart failure ([place])   . Heart murmur    since childhood, never has had any problems  . Hypertension   . Paroxysmal atrial fibrillation (HCC)   . Pneumonia   . PONV (postoperative nausea and vomiting)    a little nausea  . Rheumatoid arthritis [facility])     Past Surgical History:  Procedure Laterality Date  . CATARACT EXTRACTION Bilateral   . COLONOSCOPY    . ORIF ELBOW FRACTURE Right 09/18/2017   Procedure: OPEN REDUCTION INTERNAL FIXATION (ORIF) RIGHT ELBOW/OLECRANON FRACTURE;  Surgeon: [name], MD;  Location: [facility];  Service: Orthopedics;  Laterality: Right;  . PROSTATE SURGERY      Family History  Problem Relation Age of Onset  . COPD Sister   . Dementia Sister   . Aneurysm Brother       Social History   Socioeconomic History  . Marital status: Married    Spouse name: Not on file  . Number of children: Not on file  . Years of education: Not on file  .  Highest education level: Not on file  Occupational History  . Not on file  Tobacco Use  . Smoking status: Current Every Day Smoker    Years: 50.00    Types: Pipe  . Smokeless tobacco: Former Network engineer  . Vaping Use: Never used  Substance and Sexual Activity  . Alcohol use: No    Alcohol/week: 0.0 standard drinks  . Drug use: No  . Sexual activity: Not on file  Other Topics Concern  . Not on file  Social History Narrative  . Not on file   Social Determinants of Health   Financial Resource Strain:   . Difficulty of Paying Living Expenses:   Food Insecurity:   . Worried About Charity fundraiser in the Last Year:   .  Arboriculturist in the Last Year:   Transportation Needs:   . Film/video editor (Medical):   [name] Kitchen Lack of Transportation (Non-Medical):   Physical Activity:   . Days of Exercise per Week:   . Minutes of Exercise per Session:   Stress:   . Feeling of Stress :   Social Connections:   . Frequency of Communication with Friends and Family:   . Frequency of Social Gatherings with Friends and Family:   . Attends Religious Services:   . Active Member of Clubs or Organizations:   . Attends Archivist Meetings:   [name] Kitchen Marital Status:     No Known Allergies   Current Outpatient Medications:  .  amLODipine (NORVASC) 10 MG tablet, Take 10 mg by mouth daily. , Disp: , Rfl:  .  folic acid (FOLVITE) 1 MG tablet, Take 3 mg by mouth daily. , Disp: , Rfl:  .  InFLIXimab (REMICADE IV), Inject into the vein every 8 (eight) weeks., Disp: , Rfl:  .  latanoprost (XALATAN) 0.005 % ophthalmic solution, Place 1 drop into both eyes at bedtime. , Disp: , Rfl: 10 .  lisinopril-hydrochlorothiazide (PRINZIDE,ZESTORETIC) 20-12.5 MG tablet, Take 1 tablet by mouth daily., Disp: , Rfl: 0 .  methotrexate (RHEUMATREX) 2.5 MG tablet, Take 10 mg by mouth every Friday. , Disp: , Rfl: 1 .  Multiple Vitamin (MULITIVITAMIN WITH MINERALS) TABS, Take 1 tablet by mouth daily., Disp: , Rfl:  .  predniSONE (DELTASONE) 5 MG tablet, Take 5 mg by mouth daily with breakfast., Disp: , Rfl:  .  rivaroxaban (XARELTO) 15 MG TABS tablet, Take 1 tablet (15 mg total) by mouth daily with supper., Disp: 30 tablet, Rfl: 5    Review of Systems  Constitutional: Positive for activity change, appetite change, fatigue and fever. Negative for chills, diaphoresis and unexpected weight change.  HENT: Positive for sore throat. Negative for congestion, rhinorrhea, sinus pressure, sneezing and trouble swallowing.   Eyes: Negative for photophobia and visual disturbance.  Respiratory: Negative for cough, chest tightness, shortness of breath,  wheezing and stridor.   Cardiovascular: Negative for chest pain, palpitations and leg swelling.  Gastrointestinal: Negative for abdominal distention, abdominal pain, anal bleeding, blood in stool, constipation, diarrhea, nausea and vomiting.  Genitourinary: Negative for difficulty urinating, dysuria, flank pain and hematuria.  Musculoskeletal: Positive for myalgias. Negative for arthralgias, back pain, gait problem and joint swelling.  Skin: Negative for color change, pallor, rash and wound.  Neurological: Negative for dizziness, tremors, weakness and light-headedness.  Hematological: Negative for adenopathy. Does not bruise/bleed easily.  Psychiatric/Behavioral: Negative for agitation, behavioral problems, confusion, decreased concentration, dysphoric mood and sleep disturbance.       Objective:  Physical Exam Constitutional:      General: He is not in acute distress.    Appearance: Normal appearance. He is well-developed. He is not ill-appearing or diaphoretic.  HENT:     Head: Normocephalic and atraumatic.     Right Ear: Hearing and external ear normal.     Left Ear: Hearing and external ear normal.     Nose: No nasal deformity or rhinorrhea.     Mouth/Throat:     Mouth: Mucous membranes are moist.  Eyes:     General: No scleral icterus.    Conjunctiva/sclera: Conjunctivae normal.     Right eye: Right conjunctiva is not injected.     Left eye: Left conjunctiva is not injected.     Pupils: Pupils are equal, round, and reactive to light.  Neck:     Vascular: No JVD.  Cardiovascular:     Rate and Rhythm: Normal rate. Rhythm irregular.     Heart sounds: S1 normal and S2 normal. Murmur heard.  No friction rub.  Pulmonary:     Effort: Pulmonary effort is normal. No respiratory distress.     Breath sounds: Normal breath sounds. No stridor. No wheezing or rhonchi.  Abdominal:     General: Bowel sounds are normal. There is no distension.     Palpations: Abdomen is soft. There is  no mass.     Tenderness: There is no abdominal tenderness.     Hernia: No hernia is present.  Musculoskeletal:        General: Deformity present. Normal range of motion.     Right shoulder: Normal.     Left shoulder: Normal.     Cervical back: Normal range of motion and neck supple.     Right hip: Normal.     Left hip: Normal.     Right knee: Normal.     Left knee: Normal.  Lymphadenopathy:     Head:     Right side of head: No submandibular, preauricular or posterior auricular adenopathy.     Left side of head: No submandibular, preauricular or posterior auricular adenopathy.     Cervical: No cervical adenopathy.     Right cervical: No superficial or deep cervical adenopathy.    Left cervical: No superficial or deep cervical adenopathy.  Skin:    General: Skin is warm and dry.     Coloration: Skin is not pale.     Findings: No abrasion, bruising, ecchymosis, erythema, lesion or rash.     Nails: There is no clubbing.  Neurological:     General: No focal deficit present.     Mental Status: He is alert and oriented to person, place, and time.     Sensory: No sensory deficit.     Coordination: Coordination normal.     Gait: Gait normal.  Psychiatric:        Attention and Perception: He is attentive.        Mood and Affect: Mood normal.        Speech: Speech normal.        Behavior: Behavior normal. Behavior is cooperative.        Thought Content: Thought content normal.        Judgment: Judgment normal.    MSK : he has RA changes in hands and some bruising of skin        Assessment & Plan:   FUO:  Certainly is possible he could have had another respiratory virus besides COVID-19.  I would think  he would not have contracted COVID-19 himself with this amount of symptoms given that he has not been vaccinated although he is 86 years old.  I did order a repeat chest x-ray and there is not been a formal read performed yet.  I am ordering blood cultures though there would not  likely to be highly informative given that he is just recently been on azithromycin and had improvement.  I am also ordering a 2D echocardiogram.  I will check sed rate CRP which of course will be influenced by his rheumatoid arthritis as well as CMP and CBC I will check hepatitis virus serologies and I attempted to check an HIV test but Medicare forbade me to him doing it because he was "outside the age limits for HIV testing" I will also check for CMV and EBV serologies.  I will check RMSF and Ehrlichia convalescent titers but surely he did not have 1 of these infections as he failed to respond to doxycycline  We will see how he does in a few weeks time.  My greater anxieties for serious systemic infection such as endocarditis that may have gone undiagnosed because no one got blood cultures along the way.  He has a quite loud murmur at the PMI that is old but given his symptoms I would worry about endocarditis or other systemic infection.  Certainly if his fevers return and work-up still unremarkable I would repeat blood cultures and get a CT abdomen and pelvis with contrast.   RA: I agree with continue to withhold his infliximab until we have greater clarity on the cause of his fevers or greater certainty that they have completely resolved.  Certainly have to keep in mind intracellular pathogens including dimorphic fungi and NTM, less likely TB given he was treated for LTB

## 2019-07-30 ENCOUNTER — Ambulatory Visit (HOSPITAL_COMMUNITY)
Admission: RE | Admit: 2019-07-30 | Discharge: 2019-07-30 | Disposition: A | Discharge status: Home / Self Care | Payer: Medicare Other | Source: Ambulatory Visit | Attending: Infectious Disease | Admitting: Infectious Disease

## 2019-07-30 ENCOUNTER — Other Ambulatory Visit: Payer: Self-pay

## 2019-07-30 DIAGNOSIS — I350 Nonrheumatic aortic (valve) stenosis: Secondary | ICD-10-CM | POA: Diagnosis not present

## 2019-07-30 DIAGNOSIS — T148XXA Other injury of unspecified body region, initial encounter: Secondary | ICD-10-CM | POA: Insufficient documentation

## 2019-07-30 DIAGNOSIS — I110 Hypertensive heart disease with heart failure: Secondary | ICD-10-CM | POA: Diagnosis not present

## 2019-07-30 DIAGNOSIS — Z227 Latent tuberculosis: Secondary | ICD-10-CM | POA: Diagnosis not present

## 2019-07-30 DIAGNOSIS — M059 Rheumatoid arthritis with rheumatoid factor, unspecified: Secondary | ICD-10-CM | POA: Insufficient documentation

## 2019-07-30 DIAGNOSIS — R509 Fever, unspecified: Secondary | ICD-10-CM | POA: Diagnosis present

## 2019-07-30 DIAGNOSIS — Z114 Encounter for screening for human immunodeficiency virus [HIV]: Secondary | ICD-10-CM | POA: Diagnosis not present

## 2019-07-30 DIAGNOSIS — R011 Cardiac murmur, unspecified: Secondary | ICD-10-CM | POA: Insufficient documentation

## 2019-07-30 DIAGNOSIS — I509 Heart failure, unspecified: Secondary | ICD-10-CM | POA: Diagnosis not present

## 2019-07-30 DIAGNOSIS — W57XXXA Bitten or stung by nonvenomous insect and other nonvenomous arthropods, initial encounter: Secondary | ICD-10-CM | POA: Diagnosis not present

## 2019-07-30 DIAGNOSIS — I4891 Unspecified atrial fibrillation: Secondary | ICD-10-CM | POA: Diagnosis not present

## 2019-07-30 NOTE — Progress Notes (Signed)
  Echocardiogram 2D Echocardiogram has been performed.  Carsyn Boster G Jourdan Durbin 07/30/2019, 2:20 PM

## 2019-08-02 LAB — EPSTEIN-BARR VIRUS VCA ANTIBODY PANEL
EBV NA IgG: 409 U/mL — ABNORMAL HIGH
EBV VCA IgG: 293 U/mL — ABNORMAL HIGH
EBV VCA IgM: <36 U/mL

## 2019-08-02 LAB — COMPLETE METABOLIC PANEL WITH GFR
AG Ratio: 1.2 (calc) (ref 1.0–2.5)
ALT: 24 U/L (ref 9–46)
AST: 26 U/L (ref 10–35)
Albumin: 3.3 g/dL — ABNORMAL LOW (ref 3.6–5.1)
Alkaline phosphatase (APISO): 65 U/L (ref 35–144)
BUN: 22 mg/dL (ref 7–25)
CO2: 24 mmol/L (ref 20–32)
Calcium: 9 mg/dL (ref 8.6–10.3)
Chloride: 105 mmol/L (ref 98–110)
Creat: 1.02 mg/dL (ref 0.70–1.11)
GFR, Est African American: 76 mL/min/{1.73_m2} (ref >=60)
GFR, Est Non African American: 65 mL/min/{1.73_m2} (ref >=60)
Globulin: 2.8 g/dL (calc) (ref 1.9–3.7)
Glucose, Bld: 116 mg/dL — ABNORMAL HIGH (ref 65–99)
Potassium: 4.2 mmol/L (ref 3.5–5.3)
Sodium: 137 mmol/L (ref 135–146)
Total Bilirubin: 0.6 mg/dL (ref 0.2–1.2)
Total Protein: 6.1 g/dL (ref 6.1–8.1)

## 2019-08-02 LAB — CULTURE, BLOOD (SINGLE)
MICRO NUMBER:: 10642502
MICRO NUMBER:: 10642503
Result:: NO GROWTH
Result:: NO GROWTH
SPECIMEN QUALITY:: ADEQUATE
SPECIMEN QUALITY:: ADEQUATE

## 2019-08-02 LAB — CBC WITH DIFFERENTIAL/PLATELET
Absolute Monocytes: 710 cells/uL (ref 200–950)
Basophils Absolute: 37 cells/uL (ref 0–200)
Basophils Relative: 0.5 %
Eosinophils Absolute: 0 cells/uL — ABNORMAL LOW (ref 15–500)
Eosinophils Relative: 0 %
HCT: 38.1 % — ABNORMAL LOW (ref 38.5–50.0)
Hemoglobin: 13.1 g/dL — ABNORMAL LOW (ref 13.2–17.1)
Lymphs Abs: 1214 cells/uL (ref 850–3900)
MCH: 34.3 pg — ABNORMAL HIGH (ref 27.0–33.0)
MCHC: 34.4 g/dL (ref 32.0–36.0)
MCV: 99.7 fL (ref 80.0–100.0)
MPV: 9.8 fL (ref 7.5–12.5)
Monocytes Relative: 9.6 %
Neutro Abs: 5439 cells/uL (ref 1500–7800)
Neutrophils Relative %: 73.5 %
Platelets: 405 10*3/uL — ABNORMAL HIGH (ref 140–400)
RBC: 3.82 10*6/uL — ABNORMAL LOW (ref 4.20–5.80)
RDW: 13.4 % (ref 11.0–15.0)
Total Lymphocyte: 16.4 %
WBC: 7.4 10*3/uL (ref 3.8–10.8)

## 2019-08-02 LAB — HEPATITIS C ANTIBODY
Hepatitis C Ab: NONREACTIVE
SIGNAL TO CUT-OFF: 0.11 (ref <1.00)

## 2019-08-02 LAB — ROCKY MTN SPOTTED FVR ABS PNL(IGG+IGM)
RMSF IgG: NOT DETECTED
RMSF IgM: NOT DETECTED

## 2019-08-02 LAB — CMV IGM: CMV IgM: <30 AU/mL

## 2019-08-02 LAB — HEPATITIS B SURFACE ANTIGEN: Hepatitis B Surface Ag: NONREACTIVE

## 2019-08-02 LAB — EHRLICHIA ANTIBODY PANEL
E. CHAFFEENSIS AB IGG: <1:64 {titer}
E. CHAFFEENSIS AB IGM: <1:20 {titer}

## 2019-08-02 LAB — C-REACTIVE PROTEIN: CRP: 3.5 mg/L (ref <8.0)

## 2019-08-02 LAB — CYTOMEGALOVIRUS ANTIBODY, IGG: Cytomegalovirus Ab-IgG: <0.6 U/mL

## 2019-08-02 LAB — SEDIMENTATION RATE: Sed Rate: 41 mm/h — ABNORMAL HIGH (ref 0–20)

## 2019-08-10 DIAGNOSIS — M0589 Other rheumatoid arthritis with rheumatoid factor of multiple sites: Secondary | ICD-10-CM | POA: Diagnosis not present

## 2019-09-16 ENCOUNTER — Encounter: Payer: Self-pay | Admitting: Infectious Disease

## 2019-09-16 ENCOUNTER — Other Ambulatory Visit: Payer: Self-pay

## 2019-09-16 ENCOUNTER — Ambulatory Visit (INDEPENDENT_AMBULATORY_CARE_PROVIDER_SITE_OTHER): Payer: Medicare Other | Admitting: Infectious Disease

## 2019-09-16 VITALS — BP 139/76 | HR 59 | Wt 135.0 lb

## 2019-09-16 DIAGNOSIS — Z227 Latent tuberculosis: Secondary | ICD-10-CM

## 2019-09-16 DIAGNOSIS — R509 Fever, unspecified: Secondary | ICD-10-CM

## 2019-09-16 DIAGNOSIS — M059 Rheumatoid arthritis with rheumatoid factor, unspecified: Secondary | ICD-10-CM

## 2019-09-16 NOTE — Progress Notes (Signed)
Subjective:   Complaint: Follow-up for FUO   Patient ID: Cory Gonzalez, male    DOB: 1929-11-28, 86 y.o.   MRN: [id number]  HPI   This is an 86 year old Caucasian man who has a past medical history significant for hypertension atrial fibrillation on anticoagulation rheumatoid arthritis receiving infliximab infusions.  He was bitten by a tick that became engorged in his skin on his lower abdomen in early June.  Few days thereafter he became febrile with symptoms of headaches and diffuse myalgias.  He was initially seen at a [facility] urgent care but no further work-up or therapeutic intervention was undertaken on July 04, 2019.  He then went to see his primary care physician who became concerned rightfully so for tickborne infection such as RMSF and Ehrlichia.  He had labs drawn then as well as acute titers for RMSF and Ehrlichia which were both negative.  He was then given doxycycline but failed to improve despite being on doxycycline on a 10-day course.  He did not have dysuria but noticed that his urine color change color while on the.  In the interim I continue to have fevers he had a chest x-ray and was told that he might have pneumonia and was given a prescription for azithromycin.  He then has had his fever defervesced after finishing azithromycin which was 3 days ago.  My read of the chest x-ray report that his wife provided me from his primary care therapy office was regarding some scarring in the lungs and some atelectatic changes.  Note he has been previously treated by my partner Dr. Baxter Flattery for latent tuberculosis in 2016.  While his fevers have gone away he continues to have malaise and feel profoundly fatigued.  He was tested for COVID-19 at a local [facility] and tested negative.  He and his wife do not have much in the way of contacts other than one other relative who is visited them.  She was not known to be sick.  The patient does have a a known heart murmur and has had some slight mitral  regurgitation noted on transthoracic echocardiogram performed 2017.  Other than fevers and malaise and diffuse myalgias he has no specific symptoms associated with the fevers that he had.  He kept a temperature log which he brought with him today to clinic last visit.  I saw him in consultation I did blood work including EBV and CMV serologies sed rate CRP blood cultures.  Chest x-ray was done which was unrevealing also ordered a 2D echocardiogram which did not show evidence of endocarditis.  Since that visit the patient has continued to feel quite well and is fully recovered from his illness.  I suspect it was indeed a viral illness other than Covid since he tested negative for COVID-19. Past Medical History:  Diagnosis Date  . Chronic diastolic heart failure ([place])   . FUO (fever of unknown origin) 07/27/2019  . Heart murmur    since childhood, never has had any problems  . Hypertension   . Paroxysmal atrial fibrillation ([facility])   . Pneumonia   . PONV (postoperative nausea and vomiting)    a little nausea  . Rheumatoid arthritis ([place])   . Tick bite 07/27/2019    Past Surgical History:  Procedure Laterality Date  . CATARACT EXTRACTION Bilateral   . COLONOSCOPY    . ORIF ELBOW FRACTURE Right 09/18/2017   Procedure: OPEN REDUCTION INTERNAL FIXATION (ORIF) RIGHT ELBOW/OLECRANON FRACTURE;  Surgeon: [name], MD;  Location:  [facility];  Service: Orthopedics;  Laterality: Right;  . PROSTATE SURGERY      Family History  Problem Relation Age of Onset  . COPD Sister   . Dementia Sister   . Aneurysm Brother       Social History   Socioeconomic History  . Marital status: Married    Spouse name: Not on file  . Number of children: Not on file  . Years of education: Not on file  . Highest education level: Not on file  Occupational History  . Not on file  Tobacco Use  . Smoking status: Current Every Day Smoker    Years: 50.00    Types: Pipe  . Smokeless tobacco: Former Radiographer, therapeutic  . Vaping Use: Never used  Substance and Sexual Activity  . Alcohol use: No    Alcohol/week: 0.0 standard drinks  . Drug use: No  . Sexual activity: Not on file  Other Topics Concern  . Not on file  Social History Narrative  . Not on file   Social Determinants of Health   Financial Resource Strain:   . Difficulty of Paying Living Expenses:   Food Insecurity:   . Worried About Charity fundraiser in the Last Year:   . Arboriculturist in the Last Year:   Transportation Needs:   . Film/video editor (Medical):   [name] Kitchen Lack of Transportation (Non-Medical):   Physical Activity:   . Days of Exercise per Week:   . Minutes of Exercise per Session:   Stress:   . Feeling of Stress :   Social Connections:   . Frequency of Communication with Friends and Family:   . Frequency of Social Gatherings with Friends and Family:   . Attends Religious Services:   . Active Member of Clubs or Organizations:   . Attends Archivist Meetings:   [name] Kitchen Marital Status:     No Known Allergies   Current Outpatient Medications:  .  amLODipine (NORVASC) 10 MG tablet, Take 10 mg by mouth daily. , Disp: , Rfl:  .  folic acid (FOLVITE) 1 MG tablet, Take 3 mg by mouth daily. , Disp: , Rfl:  .  InFLIXimab (REMICADE IV), Inject into the vein every 8 (eight) weeks., Disp: , Rfl:  .  latanoprost (XALATAN) 0.005 % ophthalmic solution, Place 1 drop into both eyes at bedtime. , Disp: , Rfl: 10 .  lisinopril-hydrochlorothiazide (PRINZIDE,ZESTORETIC) 20-12.5 MG tablet, Take 1 tablet by mouth daily., Disp: , Rfl: 0 .  methotrexate (RHEUMATREX) 2.5 MG tablet, Take 10 mg by mouth every Friday. , Disp: , Rfl: 1 .  Multiple Vitamin (MULITIVITAMIN WITH MINERALS) TABS, Take 1 tablet by mouth daily., Disp: , Rfl:  .  predniSONE (DELTASONE) 5 MG tablet, Take 5 mg by mouth daily with breakfast., Disp: , Rfl:  .  rivaroxaban (XARELTO) 15 MG TABS tablet, Take 1 tablet (15 mg total) by mouth daily with supper., Disp:  30 tablet, Rfl: 5    Review of Systems  Constitutional: Positive for activity change, appetite change, fatigue and fever. Negative for chills, diaphoresis and unexpected weight change.  HENT: Positive for sore throat. Negative for congestion, rhinorrhea, sinus pressure, sneezing and trouble swallowing.   Eyes: Negative for photophobia and visual disturbance.  Respiratory: Negative for cough, chest tightness, shortness of breath, wheezing and stridor.   Cardiovascular: Negative for chest pain, palpitations and leg swelling.  Gastrointestinal: Negative for abdominal distention, abdominal pain, anal bleeding, blood in  stool, constipation, diarrhea, nausea and vomiting.  Genitourinary: Negative for difficulty urinating, dysuria, flank pain and hematuria.  Musculoskeletal: Positive for myalgias. Negative for arthralgias, back pain, gait problem and joint swelling.  Skin: Negative for color change, pallor, rash and wound.  Neurological: Negative for dizziness, tremors, weakness and light-headedness.  Hematological: Negative for adenopathy. Does not bruise/bleed easily.  Psychiatric/Behavioral: Negative for agitation, behavioral problems, confusion, decreased concentration, dysphoric mood and sleep disturbance.       Objective:   Physical Exam Constitutional:      General: He is not in acute distress.    Appearance: Normal appearance. He is well-developed. He is not ill-appearing or diaphoretic.  HENT:     Head: Normocephalic and atraumatic.     Right Ear: Hearing and external ear normal.     Left Ear: Hearing and external ear normal.     Nose: No nasal deformity or rhinorrhea.     Mouth/Throat:     Mouth: Mucous membranes are moist.  Eyes:     General: No scleral icterus.    Conjunctiva/sclera: Conjunctivae normal.     Right eye: Right conjunctiva is not injected.     Left eye: Left conjunctiva is not injected.     Pupils: Pupils are equal, round, and reactive to light.  Neck:      Vascular: No JVD.  Cardiovascular:     Rate and Rhythm: Normal rate. Rhythm irregular.     Heart sounds: S1 normal and S2 normal. Murmur heard.  No friction rub.  Pulmonary:     Effort: Pulmonary effort is normal. No respiratory distress.     Breath sounds: Normal breath sounds. No stridor. No wheezing or rhonchi.  Abdominal:     General: Bowel sounds are normal. There is no distension.     Palpations: Abdomen is soft. There is no mass.     Tenderness: There is no abdominal tenderness.     Hernia: No hernia is present.  Musculoskeletal:        General: Deformity present. Normal range of motion.     Right shoulder: Normal.     Left shoulder: Normal.     Cervical back: Normal range of motion and neck supple.     Right hip: Normal.     Left hip: Normal.     Right knee: Normal.     Left knee: Normal.  Lymphadenopathy:     Head:     Right side of head: No submandibular, preauricular or posterior auricular adenopathy.     Left side of head: No submandibular, preauricular or posterior auricular adenopathy.     Cervical: No cervical adenopathy.     Right cervical: No superficial or deep cervical adenopathy.    Left cervical: No superficial or deep cervical adenopathy.  Skin:    General: Skin is warm and dry.     Coloration: Skin is not pale.     Findings: No abrasion, bruising, ecchymosis, erythema, lesion or rash.     Nails: There is no clubbing.  Neurological:     General: No focal deficit present.     Mental Status: He is alert and oriented to person, place, and time.     Sensory: No sensory deficit.     Coordination: Coordination normal.     Gait: Gait normal.  Psychiatric:        Attention and Perception: He is attentive.        Mood and Affect: Mood normal.  Speech: Speech normal.        Behavior: Behavior normal. Behavior is cooperative.        Thought Content: Thought content normal.        Judgment: Judgment normal.    MSK : he has RA changes in hands and some  bruising of skin        Assessment & Plan:   FUO:  Laboratory work-up was unremarkable.  He has evidence of past EBV infection currently he has been afebrile and asymptomatic he probably suffered from a viral illness other than COVID-19 and is resolved.    RA: He has recovered from his illness and I would not stand the way of resuming immunosuppressive therapy if this is needed.  He can follow-up with [place] as needed

## 2019-10-06 DIAGNOSIS — M0589 Other rheumatoid arthritis with rheumatoid factor of multiple sites: Secondary | ICD-10-CM | POA: Diagnosis not present

## 2019-10-28 IMAGING — DX DG CHEST 1V PORT
1 series · 1 of 1 positions shown · non-contrast
Comparison: Portable exam 0900 hours compared to 11/12/2015

CLINICAL DATA: Pneumothorax, postoperative

EXAM:
PORTABLE CHEST 1 VIEW

[chest ap]
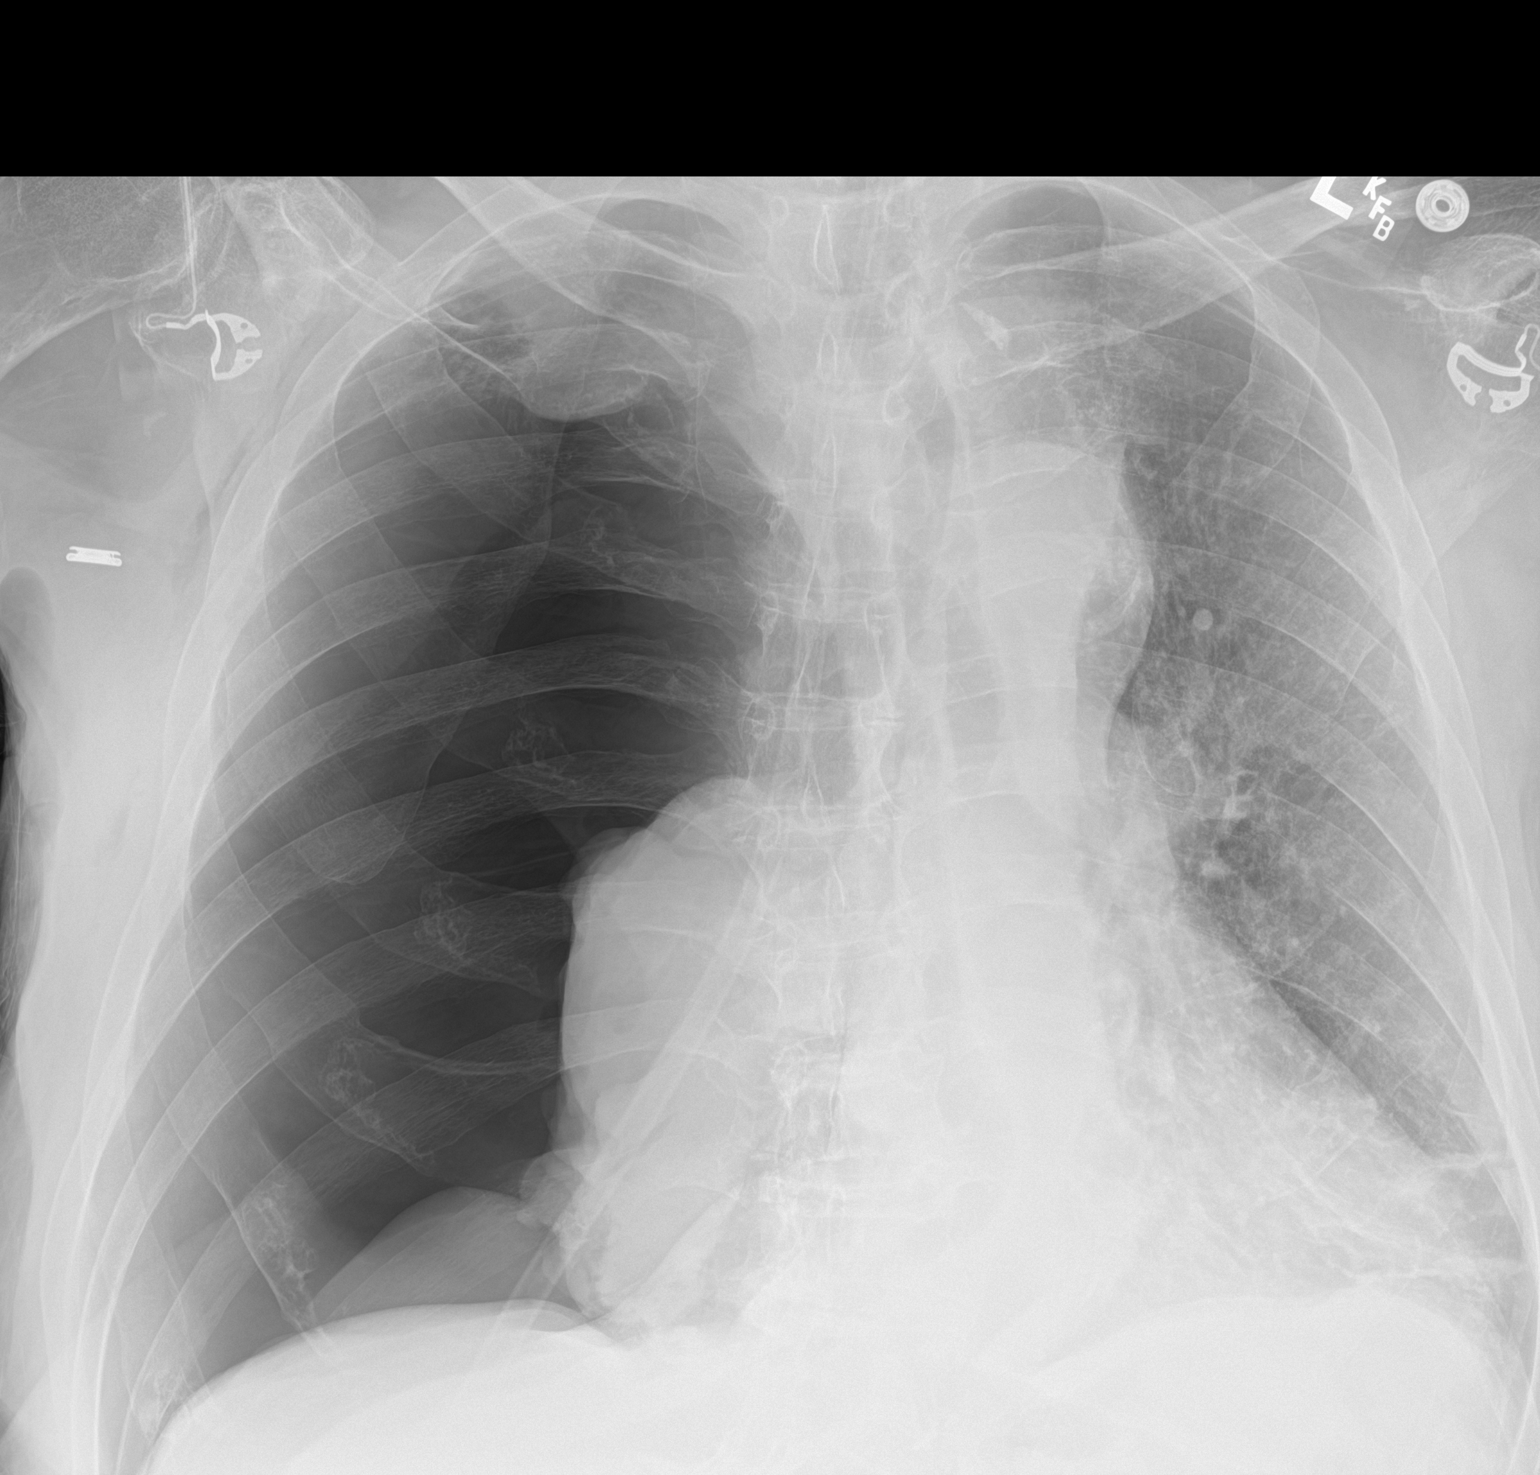

[1 of 1 positions shown; findings below may reference images not displayed]

FINDINGS: Large RIGHT pneumothorax with complete collapse of the RIGHT lung.

Mediastinal shift RIGHT to LEFT consistent with tension
pneumothorax.

Underlying emphysematous changes LEFT lung with LEFT basilar
atelectasis and central peribronchial thickening.

Normal heart size and pulmonary vascularity.

Atherosclerotic calcification aorta.

Bones demineralized.
IMPRESSION: Large RIGHT tension pneumothorax with complete collapse of the RIGHT
lung and mediastinal shift RIGHT to LEFT.

Findings discussed with nurse in PACU on 09/18/2017 at 2549 hours;
informed that Dr. Marcel is currently with patient to place chest
tube.

## 2019-10-29 IMAGING — DX DG CHEST 1V PORT
1 series · 1 of 1 positions shown · non-contrast
Comparison: 09/18/2017.

CLINICAL DATA: Pneumothorax.  Chest tube.

EXAM:
PORTABLE CHEST 1 VIEW

[chest]
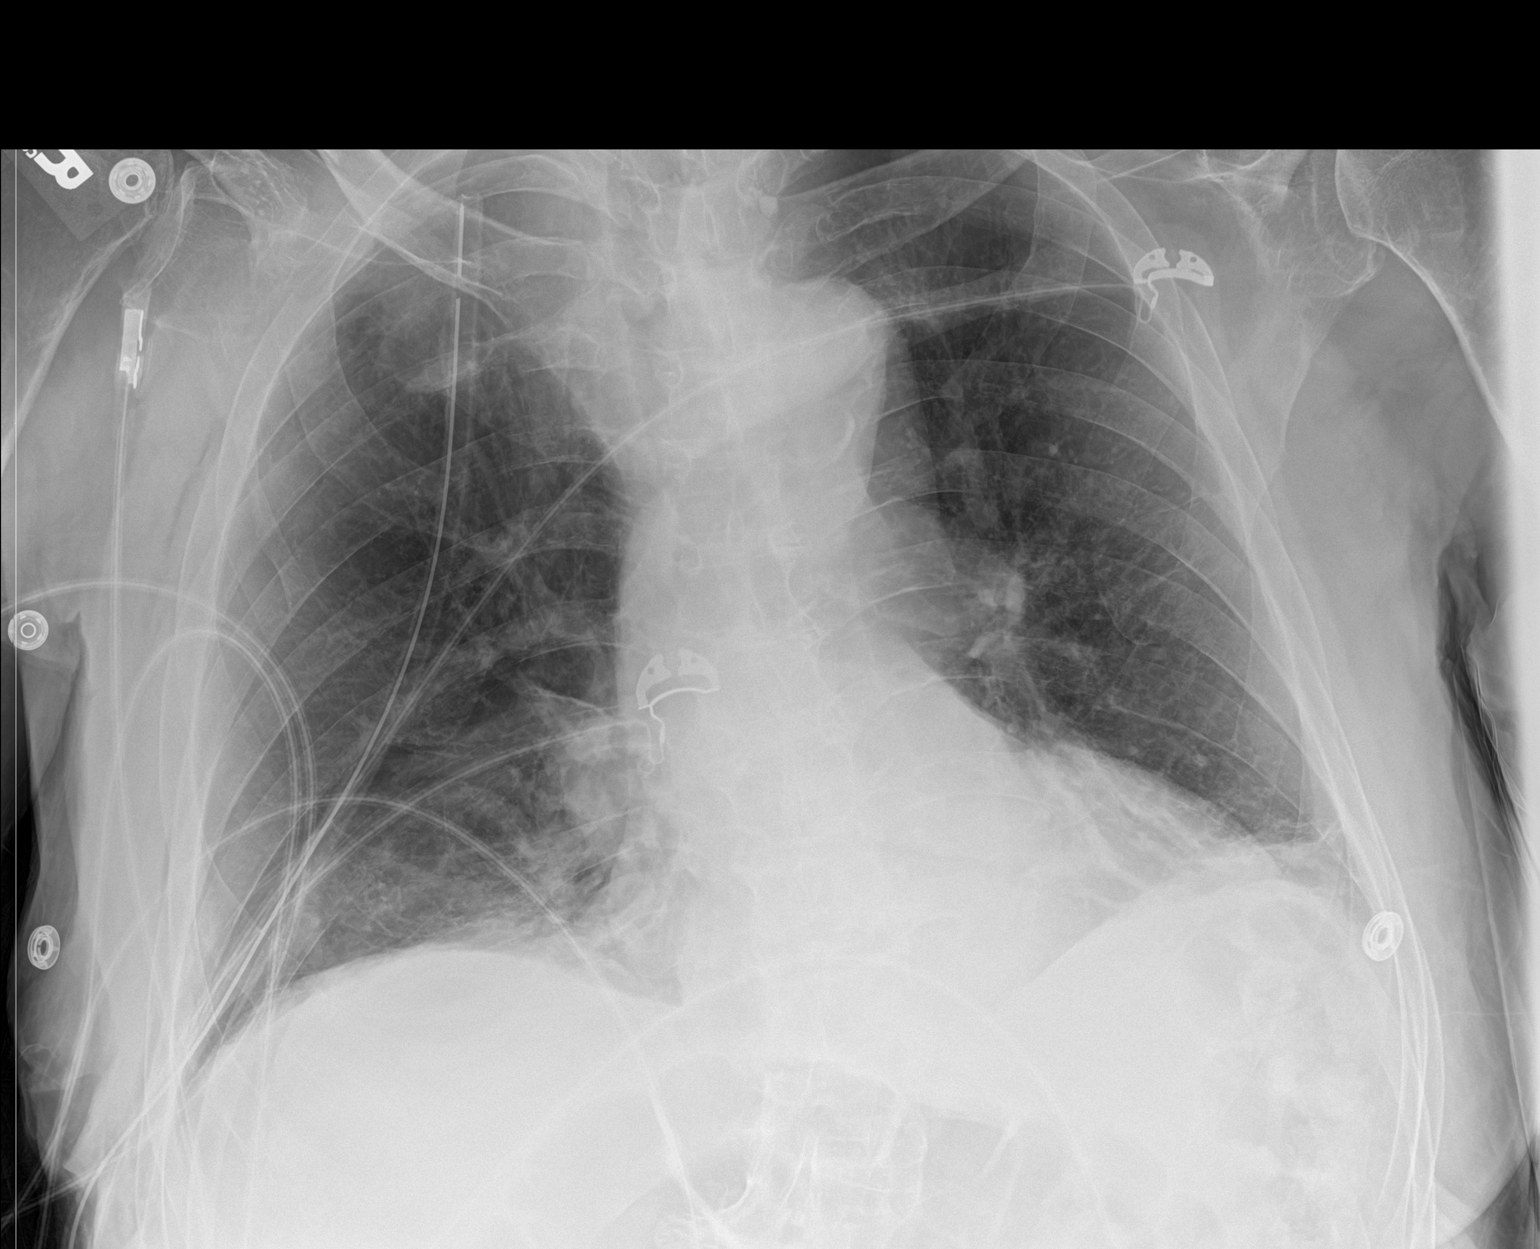

[1 of 1 positions shown; findings below may reference images not displayed]

FINDINGS: Right chest tube in stable position. No definite pneumothorax noted.
Mild bibasilar atelectasis again noted. No pleural effusion. Stable
cardiomegaly. Mild right chest wall subcutaneous emphysema again
noted.
IMPRESSION: 1. Right chest tube in stable position. No definite pneumothorax
noted. Chest appears stable from prior exam.

2.  Persistent mild bibasilar atelectasis.

3.  Stable cardiomegaly.  No pulmonary venous congestion

## 2019-11-05 DIAGNOSIS — I10 Essential (primary) hypertension: Secondary | ICD-10-CM | POA: Diagnosis not present

## 2019-11-05 DIAGNOSIS — E538 Deficiency of other specified B group vitamins: Secondary | ICD-10-CM | POA: Diagnosis not present

## 2019-11-05 DIAGNOSIS — Z79899 Other long term (current) drug therapy: Secondary | ICD-10-CM | POA: Diagnosis not present

## 2019-11-10 DIAGNOSIS — X32XXXD Exposure to sunlight, subsequent encounter: Secondary | ICD-10-CM | POA: Diagnosis not present

## 2019-11-10 DIAGNOSIS — L570 Actinic keratosis: Secondary | ICD-10-CM | POA: Diagnosis not present

## 2019-11-10 DIAGNOSIS — R972 Elevated prostate specific antigen [PSA]: Secondary | ICD-10-CM | POA: Diagnosis not present

## 2019-11-10 DIAGNOSIS — C44329 Squamous cell carcinoma of skin of other parts of face: Secondary | ICD-10-CM | POA: Diagnosis not present

## 2019-11-12 DIAGNOSIS — Z23 Encounter for immunization: Secondary | ICD-10-CM | POA: Diagnosis not present

## 2019-11-12 DIAGNOSIS — M4120 Other idiopathic scoliosis, site unspecified: Secondary | ICD-10-CM | POA: Diagnosis not present

## 2019-11-12 DIAGNOSIS — F17290 Nicotine dependence, other tobacco product, uncomplicated: Secondary | ICD-10-CM | POA: Diagnosis not present

## 2019-11-12 DIAGNOSIS — J449 Chronic obstructive pulmonary disease, unspecified: Secondary | ICD-10-CM | POA: Diagnosis not present

## 2019-11-12 DIAGNOSIS — M25512 Pain in left shoulder: Secondary | ICD-10-CM | POA: Diagnosis not present

## 2019-11-12 DIAGNOSIS — M25511 Pain in right shoulder: Secondary | ICD-10-CM | POA: Diagnosis not present

## 2019-11-12 DIAGNOSIS — I129 Hypertensive chronic kidney disease with stage 1 through stage 4 chronic kidney disease, or unspecified chronic kidney disease: Secondary | ICD-10-CM | POA: Diagnosis not present

## 2019-11-12 DIAGNOSIS — I5032 Chronic diastolic (congestive) heart failure: Secondary | ICD-10-CM | POA: Diagnosis not present

## 2019-11-12 DIAGNOSIS — M150 Primary generalized (osteo)arthritis: Secondary | ICD-10-CM | POA: Diagnosis not present

## 2019-11-12 DIAGNOSIS — I4891 Unspecified atrial fibrillation: Secondary | ICD-10-CM | POA: Diagnosis not present

## 2019-11-12 DIAGNOSIS — M0589 Other rheumatoid arthritis with rheumatoid factor of multiple sites: Secondary | ICD-10-CM | POA: Diagnosis not present

## 2019-11-12 DIAGNOSIS — R010 Benign and innocent cardiac murmurs: Secondary | ICD-10-CM | POA: Diagnosis not present

## 2019-11-17 DIAGNOSIS — R972 Elevated prostate specific antigen [PSA]: Secondary | ICD-10-CM | POA: Diagnosis not present

## 2019-11-18 IMAGING — CR DG CHEST 2V
2 series · 2 of 2 positions shown · non-contrast
Comparison: 09/24/2017, 09/23/2017 and earlier.

CLINICAL DATA: 88-year-old who had a tension pneumothorax on
09/18/2017 treated with chest tube decompression. Patient discharged
approximately 2 weeks ago. Follow-up subcutaneous emphysema.

EXAM:
CHEST - 2 VIEW

[w chest pa]
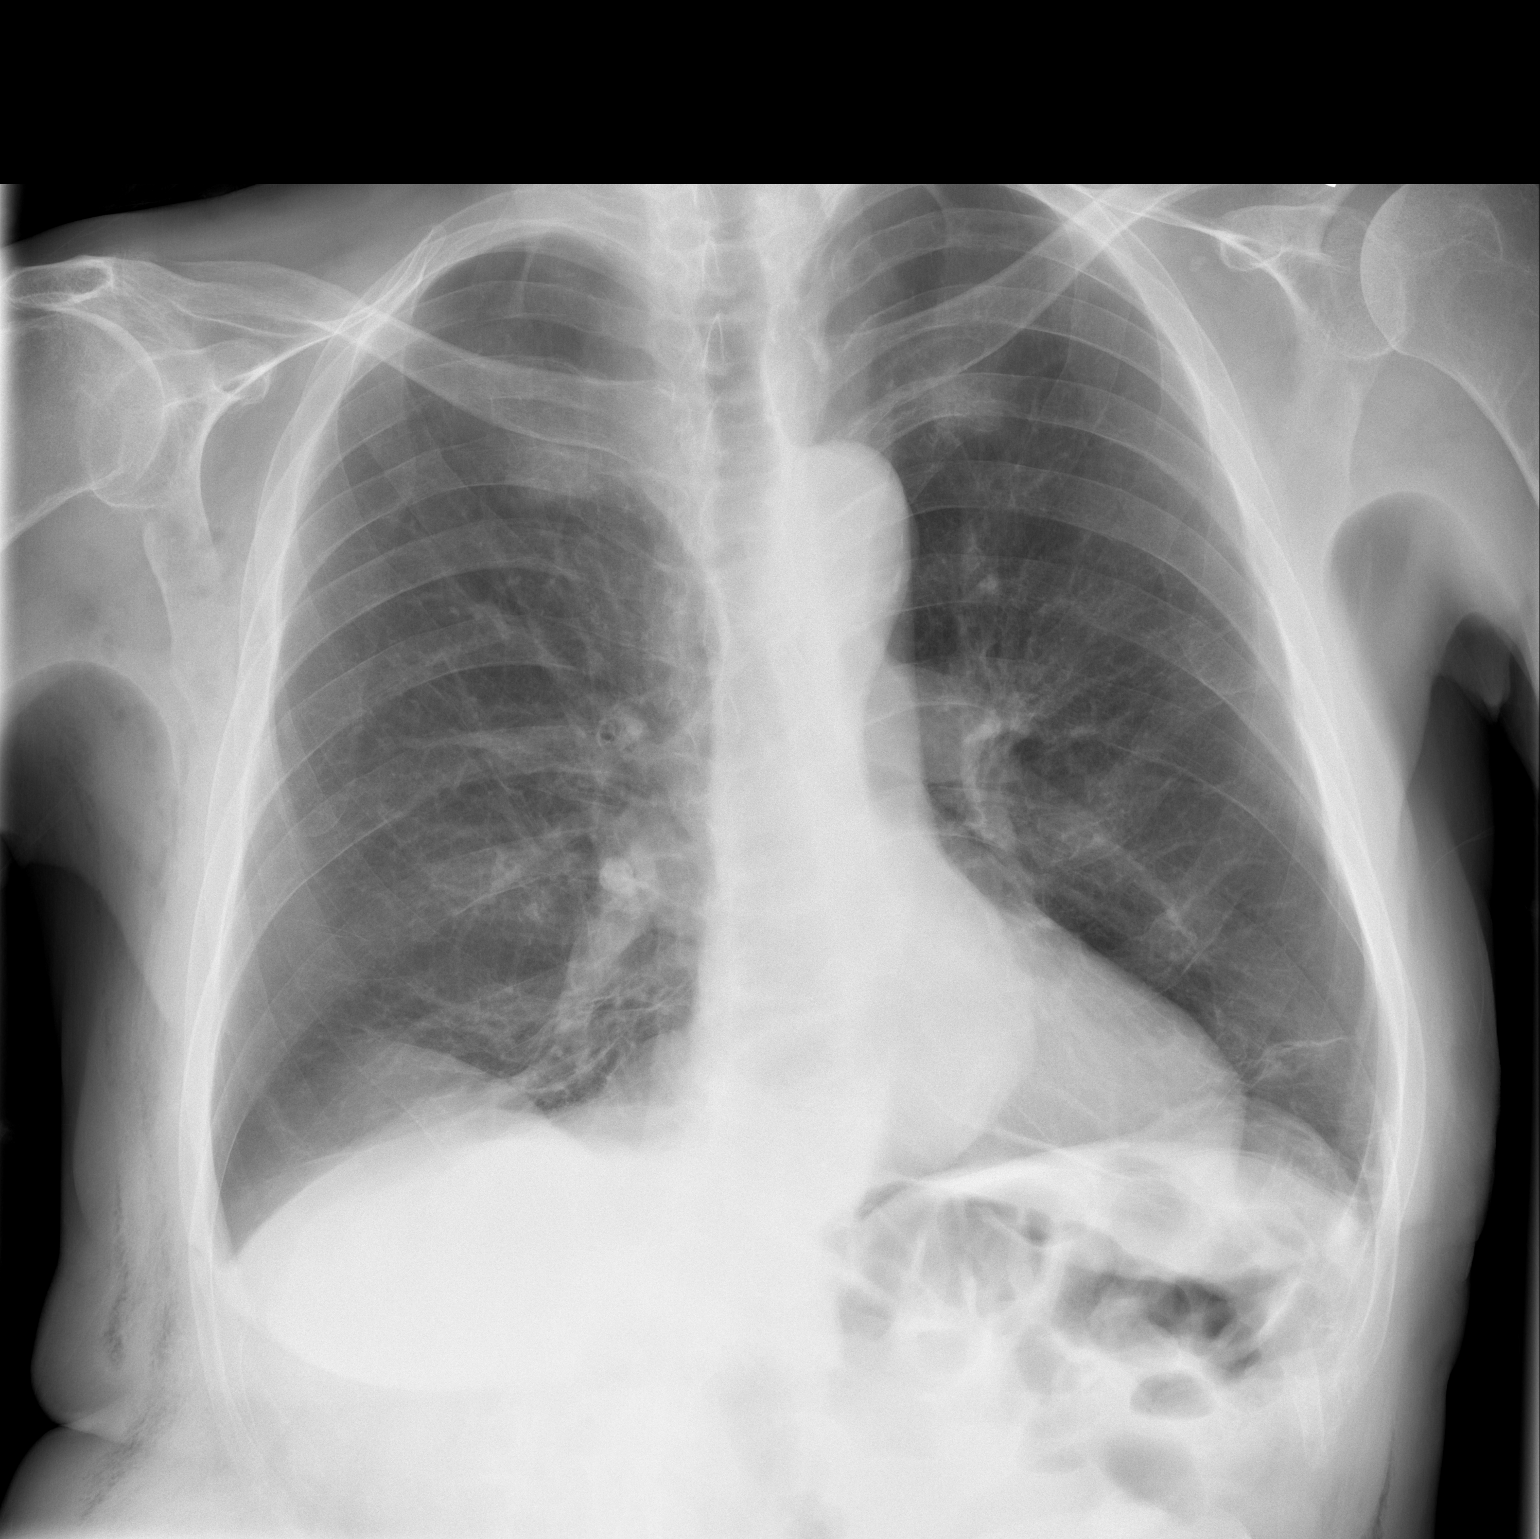

[w chest lat *]
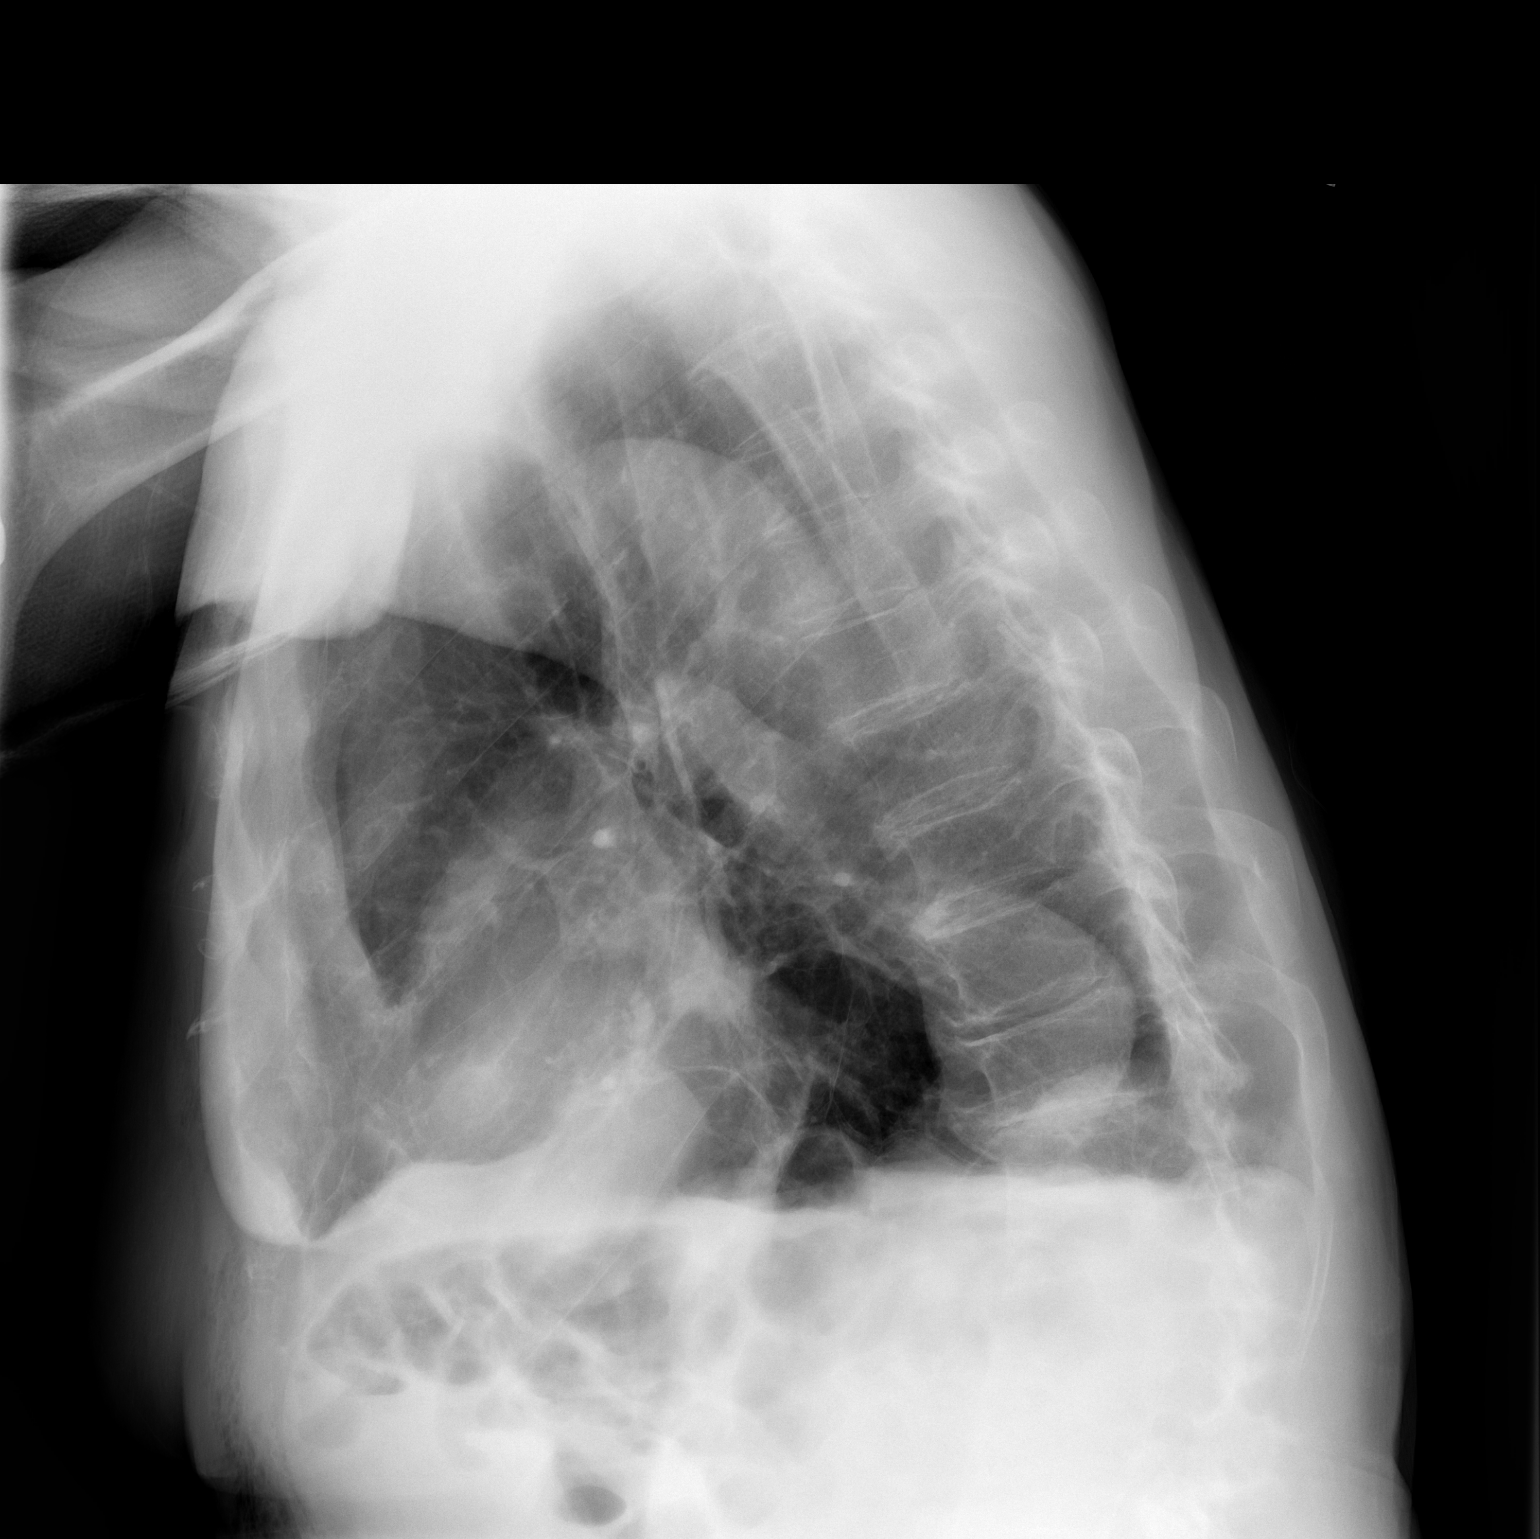

[2 of 2 positions shown; findings below may reference images not displayed]

FINDINGS: Cardiac silhouette upper normal in size. Ectasia of the descending
thoracic aorta with moderate atherosclerosis, unchanged. Prominent
central LEFT pulmonary artery, unchanged. Hilar and mediastinal
contours otherwise unremarkable.

No residual or recurrent RIGHT pneumothorax. Emphysematous changes
throughout both lungs with scarring in the BILATERAL lower lobes,
lingula and RIGHT MIDDLE LOBE, unchanged. No new pulmonary
parenchymal abnormalities. Near complete resolution of subcutaneous
emphysema in the RIGHT chest wall. Degenerative changes involving
the thoracic and UPPER lumbar spine.
IMPRESSION: 1. No evidence of residual or recurrent RIGHT pneumothorax.
2. Scarring in the BILATERAL lower lobes, lingula and RIGHT middle
lobe. No acute cardiopulmonary disease.
3.  Emphysema. ([id number])
4. Near complete resolution of subcutaneous emphysema in the RIGHT
chest wall.

## 2019-11-19 DIAGNOSIS — Z23 Encounter for immunization: Secondary | ICD-10-CM | POA: Diagnosis not present

## 2019-12-01 DIAGNOSIS — M0589 Other rheumatoid arthritis with rheumatoid factor of multiple sites: Secondary | ICD-10-CM | POA: Diagnosis not present

## 2019-12-07 DIAGNOSIS — M0589 Other rheumatoid arthritis with rheumatoid factor of multiple sites: Secondary | ICD-10-CM | POA: Diagnosis not present

## 2019-12-07 DIAGNOSIS — R7612 Nonspecific reaction to cell mediated immunity measurement of gamma interferon antigen response without active tuberculosis: Secondary | ICD-10-CM | POA: Diagnosis not present

## 2019-12-07 DIAGNOSIS — I10 Essential (primary) hypertension: Secondary | ICD-10-CM | POA: Diagnosis not present

## 2019-12-07 DIAGNOSIS — M1990 Unspecified osteoarthritis, unspecified site: Secondary | ICD-10-CM | POA: Diagnosis not present

## 2019-12-07 DIAGNOSIS — Z79899 Other long term (current) drug therapy: Secondary | ICD-10-CM | POA: Diagnosis not present

## 2019-12-07 DIAGNOSIS — N183 Chronic kidney disease, stage 3 unspecified: Secondary | ICD-10-CM | POA: Diagnosis not present

## 2019-12-07 DIAGNOSIS — M4120 Other idiopathic scoliosis, site unspecified: Secondary | ICD-10-CM | POA: Diagnosis not present

## 2019-12-08 DIAGNOSIS — L570 Actinic keratosis: Secondary | ICD-10-CM | POA: Diagnosis not present

## 2019-12-08 DIAGNOSIS — X32XXXD Exposure to sunlight, subsequent encounter: Secondary | ICD-10-CM | POA: Diagnosis not present

## 2019-12-08 DIAGNOSIS — Z08 Encounter for follow-up examination after completed treatment for malignant neoplasm: Secondary | ICD-10-CM | POA: Diagnosis not present

## 2019-12-08 DIAGNOSIS — Z85828 Personal history of other malignant neoplasm of skin: Secondary | ICD-10-CM | POA: Diagnosis not present

## 2019-12-17 DIAGNOSIS — H04123 Dry eye syndrome of bilateral lacrimal glands: Secondary | ICD-10-CM | POA: Diagnosis not present

## 2019-12-17 DIAGNOSIS — H0100A Unspecified blepharitis right eye, upper and lower eyelids: Secondary | ICD-10-CM | POA: Diagnosis not present

## 2019-12-17 DIAGNOSIS — H40053 Ocular hypertension, bilateral: Secondary | ICD-10-CM | POA: Diagnosis not present

## 2019-12-17 DIAGNOSIS — H0100B Unspecified blepharitis left eye, upper and lower eyelids: Secondary | ICD-10-CM | POA: Diagnosis not present

## 2020-01-19 DIAGNOSIS — M0589 Other rheumatoid arthritis with rheumatoid factor of multiple sites: Secondary | ICD-10-CM | POA: Diagnosis not present

## 2020-03-15 DIAGNOSIS — M0589 Other rheumatoid arthritis with rheumatoid factor of multiple sites: Secondary | ICD-10-CM | POA: Diagnosis not present

## 2020-03-30 DIAGNOSIS — M0589 Other rheumatoid arthritis with rheumatoid factor of multiple sites: Secondary | ICD-10-CM | POA: Diagnosis not present

## 2020-03-30 DIAGNOSIS — R7612 Nonspecific reaction to cell mediated immunity measurement of gamma interferon antigen response without active tuberculosis: Secondary | ICD-10-CM | POA: Diagnosis not present

## 2020-03-30 DIAGNOSIS — I10 Essential (primary) hypertension: Secondary | ICD-10-CM | POA: Diagnosis not present

## 2020-03-30 DIAGNOSIS — N183 Chronic kidney disease, stage 3 unspecified: Secondary | ICD-10-CM | POA: Diagnosis not present

## 2020-03-30 DIAGNOSIS — R062 Wheezing: Secondary | ICD-10-CM | POA: Diagnosis not present

## 2020-03-30 DIAGNOSIS — M1990 Unspecified osteoarthritis, unspecified site: Secondary | ICD-10-CM | POA: Diagnosis not present

## 2020-03-30 DIAGNOSIS — Z79899 Other long term (current) drug therapy: Secondary | ICD-10-CM | POA: Diagnosis not present

## 2020-03-30 DIAGNOSIS — M4120 Other idiopathic scoliosis, site unspecified: Secondary | ICD-10-CM | POA: Diagnosis not present

## 2020-04-19 DIAGNOSIS — M9905 Segmental and somatic dysfunction of pelvic region: Secondary | ICD-10-CM | POA: Diagnosis not present

## 2020-04-19 DIAGNOSIS — M5137 Other intervertebral disc degeneration, lumbosacral region: Secondary | ICD-10-CM | POA: Diagnosis not present

## 2020-04-19 DIAGNOSIS — M9903 Segmental and somatic dysfunction of lumbar region: Secondary | ICD-10-CM | POA: Diagnosis not present

## 2020-04-19 DIAGNOSIS — M5136 Other intervertebral disc degeneration, lumbar region: Secondary | ICD-10-CM | POA: Diagnosis not present

## 2020-04-19 DIAGNOSIS — M609 Myositis, unspecified: Secondary | ICD-10-CM | POA: Diagnosis not present

## 2020-04-20 DIAGNOSIS — M9905 Segmental and somatic dysfunction of pelvic region: Secondary | ICD-10-CM | POA: Diagnosis not present

## 2020-04-20 DIAGNOSIS — M9903 Segmental and somatic dysfunction of lumbar region: Secondary | ICD-10-CM | POA: Diagnosis not present

## 2020-04-20 DIAGNOSIS — M609 Myositis, unspecified: Secondary | ICD-10-CM | POA: Diagnosis not present

## 2020-04-20 DIAGNOSIS — M5137 Other intervertebral disc degeneration, lumbosacral region: Secondary | ICD-10-CM | POA: Diagnosis not present

## 2020-04-20 DIAGNOSIS — M5136 Other intervertebral disc degeneration, lumbar region: Secondary | ICD-10-CM | POA: Diagnosis not present

## 2020-04-21 DIAGNOSIS — M609 Myositis, unspecified: Secondary | ICD-10-CM | POA: Diagnosis not present

## 2020-04-21 DIAGNOSIS — M9905 Segmental and somatic dysfunction of pelvic region: Secondary | ICD-10-CM | POA: Diagnosis not present

## 2020-04-21 DIAGNOSIS — M9903 Segmental and somatic dysfunction of lumbar region: Secondary | ICD-10-CM | POA: Diagnosis not present

## 2020-04-21 DIAGNOSIS — M5137 Other intervertebral disc degeneration, lumbosacral region: Secondary | ICD-10-CM | POA: Diagnosis not present

## 2020-04-21 DIAGNOSIS — M5136 Other intervertebral disc degeneration, lumbar region: Secondary | ICD-10-CM | POA: Diagnosis not present

## 2020-04-25 DIAGNOSIS — M5137 Other intervertebral disc degeneration, lumbosacral region: Secondary | ICD-10-CM | POA: Diagnosis not present

## 2020-04-25 DIAGNOSIS — M609 Myositis, unspecified: Secondary | ICD-10-CM | POA: Diagnosis not present

## 2020-04-25 DIAGNOSIS — M5136 Other intervertebral disc degeneration, lumbar region: Secondary | ICD-10-CM | POA: Diagnosis not present

## 2020-04-25 DIAGNOSIS — M9905 Segmental and somatic dysfunction of pelvic region: Secondary | ICD-10-CM | POA: Diagnosis not present

## 2020-04-25 DIAGNOSIS — M9903 Segmental and somatic dysfunction of lumbar region: Secondary | ICD-10-CM | POA: Diagnosis not present

## 2020-04-27 DIAGNOSIS — M609 Myositis, unspecified: Secondary | ICD-10-CM | POA: Diagnosis not present

## 2020-04-27 DIAGNOSIS — M9903 Segmental and somatic dysfunction of lumbar region: Secondary | ICD-10-CM | POA: Diagnosis not present

## 2020-04-27 DIAGNOSIS — M5137 Other intervertebral disc degeneration, lumbosacral region: Secondary | ICD-10-CM | POA: Diagnosis not present

## 2020-04-27 DIAGNOSIS — M9905 Segmental and somatic dysfunction of pelvic region: Secondary | ICD-10-CM | POA: Diagnosis not present

## 2020-04-27 DIAGNOSIS — M5136 Other intervertebral disc degeneration, lumbar region: Secondary | ICD-10-CM | POA: Diagnosis not present

## 2020-05-02 DIAGNOSIS — M9903 Segmental and somatic dysfunction of lumbar region: Secondary | ICD-10-CM | POA: Diagnosis not present

## 2020-05-02 DIAGNOSIS — M5137 Other intervertebral disc degeneration, lumbosacral region: Secondary | ICD-10-CM | POA: Diagnosis not present

## 2020-05-02 DIAGNOSIS — M609 Myositis, unspecified: Secondary | ICD-10-CM | POA: Diagnosis not present

## 2020-05-02 DIAGNOSIS — M9905 Segmental and somatic dysfunction of pelvic region: Secondary | ICD-10-CM | POA: Diagnosis not present

## 2020-05-02 DIAGNOSIS — M5136 Other intervertebral disc degeneration, lumbar region: Secondary | ICD-10-CM | POA: Diagnosis not present

## 2020-05-04 DIAGNOSIS — M5137 Other intervertebral disc degeneration, lumbosacral region: Secondary | ICD-10-CM | POA: Diagnosis not present

## 2020-05-04 DIAGNOSIS — M9903 Segmental and somatic dysfunction of lumbar region: Secondary | ICD-10-CM | POA: Diagnosis not present

## 2020-05-04 DIAGNOSIS — M609 Myositis, unspecified: Secondary | ICD-10-CM | POA: Diagnosis not present

## 2020-05-04 DIAGNOSIS — M5136 Other intervertebral disc degeneration, lumbar region: Secondary | ICD-10-CM | POA: Diagnosis not present

## 2020-05-04 DIAGNOSIS — M9905 Segmental and somatic dysfunction of pelvic region: Secondary | ICD-10-CM | POA: Diagnosis not present

## 2020-05-05 DIAGNOSIS — Z23 Encounter for immunization: Secondary | ICD-10-CM | POA: Diagnosis not present

## 2020-05-05 DIAGNOSIS — I129 Hypertensive chronic kidney disease with stage 1 through stage 4 chronic kidney disease, or unspecified chronic kidney disease: Secondary | ICD-10-CM | POA: Diagnosis not present

## 2020-05-05 DIAGNOSIS — N183 Chronic kidney disease, stage 3 unspecified: Secondary | ICD-10-CM | POA: Diagnosis not present

## 2020-05-05 DIAGNOSIS — Z0000 Encounter for general adult medical examination without abnormal findings: Secondary | ICD-10-CM | POA: Diagnosis not present

## 2020-05-10 DIAGNOSIS — M0589 Other rheumatoid arthritis with rheumatoid factor of multiple sites: Secondary | ICD-10-CM | POA: Diagnosis not present

## 2020-05-16 DIAGNOSIS — M4120 Other idiopathic scoliosis, site unspecified: Secondary | ICD-10-CM | POA: Diagnosis not present

## 2020-05-16 DIAGNOSIS — M0589 Other rheumatoid arthritis with rheumatoid factor of multiple sites: Secondary | ICD-10-CM | POA: Diagnosis not present

## 2020-05-16 DIAGNOSIS — N182 Chronic kidney disease, stage 2 (mild): Secondary | ICD-10-CM | POA: Diagnosis not present

## 2020-05-16 DIAGNOSIS — I10 Essential (primary) hypertension: Secondary | ICD-10-CM | POA: Diagnosis not present

## 2020-05-16 DIAGNOSIS — D692 Other nonthrombocytopenic purpura: Secondary | ICD-10-CM | POA: Diagnosis not present

## 2020-05-16 DIAGNOSIS — R011 Cardiac murmur, unspecified: Secondary | ICD-10-CM | POA: Diagnosis not present

## 2020-05-16 DIAGNOSIS — I4891 Unspecified atrial fibrillation: Secondary | ICD-10-CM | POA: Diagnosis not present

## 2020-05-16 DIAGNOSIS — E0590 Thyrotoxicosis, unspecified without thyrotoxic crisis or storm: Secondary | ICD-10-CM | POA: Diagnosis not present

## 2020-05-16 DIAGNOSIS — I700 Atherosclerosis of aorta: Secondary | ICD-10-CM | POA: Diagnosis not present

## 2020-05-16 DIAGNOSIS — J432 Centrilobular emphysema: Secondary | ICD-10-CM | POA: Diagnosis not present

## 2020-05-16 DIAGNOSIS — I5032 Chronic diastolic (congestive) heart failure: Secondary | ICD-10-CM | POA: Diagnosis not present

## 2020-05-19 DIAGNOSIS — R011 Cardiac murmur, unspecified: Secondary | ICD-10-CM | POA: Diagnosis not present

## 2020-05-23 DIAGNOSIS — M609 Myositis, unspecified: Secondary | ICD-10-CM | POA: Diagnosis not present

## 2020-05-23 DIAGNOSIS — M9905 Segmental and somatic dysfunction of pelvic region: Secondary | ICD-10-CM | POA: Diagnosis not present

## 2020-05-23 DIAGNOSIS — M5136 Other intervertebral disc degeneration, lumbar region: Secondary | ICD-10-CM | POA: Diagnosis not present

## 2020-05-23 DIAGNOSIS — M5137 Other intervertebral disc degeneration, lumbosacral region: Secondary | ICD-10-CM | POA: Diagnosis not present

## 2020-05-23 DIAGNOSIS — M9903 Segmental and somatic dysfunction of lumbar region: Secondary | ICD-10-CM | POA: Diagnosis not present

## 2020-06-06 DIAGNOSIS — M5137 Other intervertebral disc degeneration, lumbosacral region: Secondary | ICD-10-CM | POA: Diagnosis not present

## 2020-06-06 DIAGNOSIS — M9905 Segmental and somatic dysfunction of pelvic region: Secondary | ICD-10-CM | POA: Diagnosis not present

## 2020-06-06 DIAGNOSIS — M609 Myositis, unspecified: Secondary | ICD-10-CM | POA: Diagnosis not present

## 2020-06-06 DIAGNOSIS — M9903 Segmental and somatic dysfunction of lumbar region: Secondary | ICD-10-CM | POA: Diagnosis not present

## 2020-06-06 DIAGNOSIS — M5136 Other intervertebral disc degeneration, lumbar region: Secondary | ICD-10-CM | POA: Diagnosis not present

## 2020-06-16 DIAGNOSIS — Z23 Encounter for immunization: Secondary | ICD-10-CM | POA: Diagnosis not present

## 2020-06-20 DIAGNOSIS — H0100B Unspecified blepharitis left eye, upper and lower eyelids: Secondary | ICD-10-CM | POA: Diagnosis not present

## 2020-06-20 DIAGNOSIS — H26492 Other secondary cataract, left eye: Secondary | ICD-10-CM | POA: Diagnosis not present

## 2020-06-20 DIAGNOSIS — H5202 Hypermetropia, left eye: Secondary | ICD-10-CM | POA: Diagnosis not present

## 2020-06-20 DIAGNOSIS — H52203 Unspecified astigmatism, bilateral: Secondary | ICD-10-CM | POA: Diagnosis not present

## 2020-06-20 DIAGNOSIS — Z961 Presence of intraocular lens: Secondary | ICD-10-CM | POA: Diagnosis not present

## 2020-06-20 DIAGNOSIS — H04123 Dry eye syndrome of bilateral lacrimal glands: Secondary | ICD-10-CM | POA: Diagnosis not present

## 2020-06-20 DIAGNOSIS — H524 Presbyopia: Secondary | ICD-10-CM | POA: Diagnosis not present

## 2020-06-20 DIAGNOSIS — H0100A Unspecified blepharitis right eye, upper and lower eyelids: Secondary | ICD-10-CM | POA: Diagnosis not present

## 2020-06-20 DIAGNOSIS — H40053 Ocular hypertension, bilateral: Secondary | ICD-10-CM | POA: Diagnosis not present

## 2020-06-21 DIAGNOSIS — M9903 Segmental and somatic dysfunction of lumbar region: Secondary | ICD-10-CM | POA: Diagnosis not present

## 2020-06-21 DIAGNOSIS — M5137 Other intervertebral disc degeneration, lumbosacral region: Secondary | ICD-10-CM | POA: Diagnosis not present

## 2020-06-21 DIAGNOSIS — M9905 Segmental and somatic dysfunction of pelvic region: Secondary | ICD-10-CM | POA: Diagnosis not present

## 2020-06-21 DIAGNOSIS — M609 Myositis, unspecified: Secondary | ICD-10-CM | POA: Diagnosis not present

## 2020-06-21 DIAGNOSIS — M5136 Other intervertebral disc degeneration, lumbar region: Secondary | ICD-10-CM | POA: Diagnosis not present

## 2020-07-04 DIAGNOSIS — M9905 Segmental and somatic dysfunction of pelvic region: Secondary | ICD-10-CM | POA: Diagnosis not present

## 2020-07-04 DIAGNOSIS — M9903 Segmental and somatic dysfunction of lumbar region: Secondary | ICD-10-CM | POA: Diagnosis not present

## 2020-07-04 DIAGNOSIS — M5137 Other intervertebral disc degeneration, lumbosacral region: Secondary | ICD-10-CM | POA: Diagnosis not present

## 2020-07-04 DIAGNOSIS — M609 Myositis, unspecified: Secondary | ICD-10-CM | POA: Diagnosis not present

## 2020-07-04 DIAGNOSIS — M5136 Other intervertebral disc degeneration, lumbar region: Secondary | ICD-10-CM | POA: Diagnosis not present

## 2020-07-05 DIAGNOSIS — M0589 Other rheumatoid arthritis with rheumatoid factor of multiple sites: Secondary | ICD-10-CM | POA: Diagnosis not present

## 2020-07-13 DIAGNOSIS — L570 Actinic keratosis: Secondary | ICD-10-CM | POA: Diagnosis not present

## 2020-07-13 DIAGNOSIS — X32XXXD Exposure to sunlight, subsequent encounter: Secondary | ICD-10-CM | POA: Diagnosis not present

## 2020-07-13 DIAGNOSIS — C44319 Basal cell carcinoma of skin of other parts of face: Secondary | ICD-10-CM | POA: Diagnosis not present

## 2020-07-13 DIAGNOSIS — C44212 Basal cell carcinoma of skin of right ear and external auricular canal: Secondary | ICD-10-CM | POA: Diagnosis not present

## 2020-07-18 DIAGNOSIS — M5137 Other intervertebral disc degeneration, lumbosacral region: Secondary | ICD-10-CM | POA: Diagnosis not present

## 2020-07-18 DIAGNOSIS — M9903 Segmental and somatic dysfunction of lumbar region: Secondary | ICD-10-CM | POA: Diagnosis not present

## 2020-07-18 DIAGNOSIS — M9905 Segmental and somatic dysfunction of pelvic region: Secondary | ICD-10-CM | POA: Diagnosis not present

## 2020-07-18 DIAGNOSIS — M0589 Other rheumatoid arthritis with rheumatoid factor of multiple sites: Secondary | ICD-10-CM | POA: Diagnosis not present

## 2020-07-18 DIAGNOSIS — M5136 Other intervertebral disc degeneration, lumbar region: Secondary | ICD-10-CM | POA: Diagnosis not present

## 2020-07-18 DIAGNOSIS — E0590 Thyrotoxicosis, unspecified without thyrotoxic crisis or storm: Secondary | ICD-10-CM | POA: Diagnosis not present

## 2020-07-18 DIAGNOSIS — M609 Myositis, unspecified: Secondary | ICD-10-CM | POA: Diagnosis not present

## 2020-07-25 DIAGNOSIS — J432 Centrilobular emphysema: Secondary | ICD-10-CM | POA: Diagnosis not present

## 2020-07-25 DIAGNOSIS — I129 Hypertensive chronic kidney disease with stage 1 through stage 4 chronic kidney disease, or unspecified chronic kidney disease: Secondary | ICD-10-CM | POA: Diagnosis not present

## 2020-07-25 DIAGNOSIS — R011 Cardiac murmur, unspecified: Secondary | ICD-10-CM | POA: Diagnosis not present

## 2020-07-25 DIAGNOSIS — D692 Other nonthrombocytopenic purpura: Secondary | ICD-10-CM | POA: Diagnosis not present

## 2020-07-25 DIAGNOSIS — I700 Atherosclerosis of aorta: Secondary | ICD-10-CM | POA: Diagnosis not present

## 2020-07-25 DIAGNOSIS — E0590 Thyrotoxicosis, unspecified without thyrotoxic crisis or storm: Secondary | ICD-10-CM | POA: Diagnosis not present

## 2020-07-25 DIAGNOSIS — N182 Chronic kidney disease, stage 2 (mild): Secondary | ICD-10-CM | POA: Diagnosis not present

## 2020-07-25 DIAGNOSIS — I5032 Chronic diastolic (congestive) heart failure: Secondary | ICD-10-CM | POA: Diagnosis not present

## 2020-07-25 DIAGNOSIS — I10 Essential (primary) hypertension: Secondary | ICD-10-CM | POA: Diagnosis not present

## 2020-07-25 DIAGNOSIS — M0589 Other rheumatoid arthritis with rheumatoid factor of multiple sites: Secondary | ICD-10-CM | POA: Diagnosis not present

## 2020-07-25 DIAGNOSIS — I77810 Thoracic aortic ectasia: Secondary | ICD-10-CM | POA: Diagnosis not present

## 2020-07-25 DIAGNOSIS — I4891 Unspecified atrial fibrillation: Secondary | ICD-10-CM | POA: Diagnosis not present

## 2020-08-04 DIAGNOSIS — M1990 Unspecified osteoarthritis, unspecified site: Secondary | ICD-10-CM | POA: Diagnosis not present

## 2020-08-04 DIAGNOSIS — N183 Chronic kidney disease, stage 3 unspecified: Secondary | ICD-10-CM | POA: Diagnosis not present

## 2020-08-04 DIAGNOSIS — M4120 Other idiopathic scoliosis, site unspecified: Secondary | ICD-10-CM | POA: Diagnosis not present

## 2020-08-04 DIAGNOSIS — I10 Essential (primary) hypertension: Secondary | ICD-10-CM | POA: Diagnosis not present

## 2020-08-04 DIAGNOSIS — M549 Dorsalgia, unspecified: Secondary | ICD-10-CM | POA: Diagnosis not present

## 2020-08-04 DIAGNOSIS — Z79899 Other long term (current) drug therapy: Secondary | ICD-10-CM | POA: Diagnosis not present

## 2020-08-04 DIAGNOSIS — R7612 Nonspecific reaction to cell mediated immunity measurement of gamma interferon antigen response without active tuberculosis: Secondary | ICD-10-CM | POA: Diagnosis not present

## 2020-08-04 DIAGNOSIS — M0589 Other rheumatoid arthritis with rheumatoid factor of multiple sites: Secondary | ICD-10-CM | POA: Diagnosis not present

## 2020-08-08 DIAGNOSIS — M9903 Segmental and somatic dysfunction of lumbar region: Secondary | ICD-10-CM | POA: Diagnosis not present

## 2020-08-08 DIAGNOSIS — M5137 Other intervertebral disc degeneration, lumbosacral region: Secondary | ICD-10-CM | POA: Diagnosis not present

## 2020-08-08 DIAGNOSIS — M5136 Other intervertebral disc degeneration, lumbar region: Secondary | ICD-10-CM | POA: Diagnosis not present

## 2020-08-08 DIAGNOSIS — M9905 Segmental and somatic dysfunction of pelvic region: Secondary | ICD-10-CM | POA: Diagnosis not present

## 2020-08-08 DIAGNOSIS — M609 Myositis, unspecified: Secondary | ICD-10-CM | POA: Diagnosis not present

## 2020-08-10 DIAGNOSIS — C44311 Basal cell carcinoma of skin of nose: Secondary | ICD-10-CM | POA: Diagnosis not present

## 2020-08-10 DIAGNOSIS — Z85828 Personal history of other malignant neoplasm of skin: Secondary | ICD-10-CM | POA: Diagnosis not present

## 2020-08-10 DIAGNOSIS — Z08 Encounter for follow-up examination after completed treatment for malignant neoplasm: Secondary | ICD-10-CM | POA: Diagnosis not present

## 2020-08-30 DIAGNOSIS — M0589 Other rheumatoid arthritis with rheumatoid factor of multiple sites: Secondary | ICD-10-CM | POA: Diagnosis not present

## 2020-09-07 DIAGNOSIS — Z08 Encounter for follow-up examination after completed treatment for malignant neoplasm: Secondary | ICD-10-CM | POA: Diagnosis not present

## 2020-09-07 DIAGNOSIS — L820 Inflamed seborrheic keratosis: Secondary | ICD-10-CM | POA: Diagnosis not present

## 2020-09-07 DIAGNOSIS — L570 Actinic keratosis: Secondary | ICD-10-CM | POA: Diagnosis not present

## 2020-09-07 DIAGNOSIS — Z85828 Personal history of other malignant neoplasm of skin: Secondary | ICD-10-CM | POA: Diagnosis not present

## 2020-09-07 DIAGNOSIS — X32XXXD Exposure to sunlight, subsequent encounter: Secondary | ICD-10-CM | POA: Diagnosis not present

## 2020-09-12 ENCOUNTER — Emergency Department (HOSPITAL_BASED_OUTPATIENT_CLINIC_OR_DEPARTMENT_OTHER): Payer: Medicare Other

## 2020-09-12 ENCOUNTER — Other Ambulatory Visit: Payer: Self-pay

## 2020-09-12 ENCOUNTER — Emergency Department (HOSPITAL_BASED_OUTPATIENT_CLINIC_OR_DEPARTMENT_OTHER)
Admission: EM | Admit: 2020-09-12 | Discharge: 2020-09-12 | Disposition: A | Discharge status: Home / Self Care | Payer: Medicare Other | Attending: Emergency Medicine | Admitting: Emergency Medicine

## 2020-09-12 ENCOUNTER — Encounter (HOSPITAL_BASED_OUTPATIENT_CLINIC_OR_DEPARTMENT_OTHER): Payer: Self-pay | Admitting: *Deleted

## 2020-09-12 DIAGNOSIS — R0789 Other chest pain: Secondary | ICD-10-CM

## 2020-09-12 DIAGNOSIS — Z7901 Long term (current) use of anticoagulants: Secondary | ICD-10-CM | POA: Insufficient documentation

## 2020-09-12 DIAGNOSIS — J439 Emphysema, unspecified: Secondary | ICD-10-CM | POA: Diagnosis not present

## 2020-09-12 DIAGNOSIS — W1839XA Other fall on same level, initial encounter: Secondary | ICD-10-CM | POA: Insufficient documentation

## 2020-09-12 DIAGNOSIS — F17290 Nicotine dependence, other tobacco product, uncomplicated: Secondary | ICD-10-CM | POA: Insufficient documentation

## 2020-09-12 DIAGNOSIS — Z79899 Other long term (current) drug therapy: Secondary | ICD-10-CM | POA: Diagnosis not present

## 2020-09-12 DIAGNOSIS — I5032 Chronic diastolic (congestive) heart failure: Secondary | ICD-10-CM | POA: Diagnosis not present

## 2020-09-12 DIAGNOSIS — J9811 Atelectasis: Secondary | ICD-10-CM | POA: Diagnosis not present

## 2020-09-12 DIAGNOSIS — I700 Atherosclerosis of aorta: Secondary | ICD-10-CM | POA: Diagnosis not present

## 2020-09-12 DIAGNOSIS — S20211A Contusion of right front wall of thorax, initial encounter: Secondary | ICD-10-CM | POA: Insufficient documentation

## 2020-09-12 DIAGNOSIS — R079 Chest pain, unspecified: Secondary | ICD-10-CM | POA: Diagnosis not present

## 2020-09-12 DIAGNOSIS — I110 Hypertensive heart disease with heart failure: Secondary | ICD-10-CM | POA: Diagnosis not present

## 2020-09-12 DIAGNOSIS — S299XXA Unspecified injury of thorax, initial encounter: Secondary | ICD-10-CM | POA: Diagnosis not present

## 2020-09-12 DIAGNOSIS — W19XXXA Unspecified fall, initial encounter: Secondary | ICD-10-CM

## 2020-09-12 MED ORDER — HYDROCODONE-ACETAMINOPHEN 5-325 MG PO TABS
1.0000 | ORAL_TABLET | Freq: Once | ORAL | Status: AC
  Administered 2020-09-12: 1 via ORAL
  Filled 2020-09-12: qty 1

## 2020-09-12 MED ORDER — HYDROCODONE-ACETAMINOPHEN 5-325 MG PO TABS: 1.0000 | ORAL_TABLET | Freq: Four times a day (QID) | ORAL | 0 refills | Status: DC | PRN

## 2020-09-12 NOTE — ED Triage Notes (Addendum)
C/o fall x 6 hrs ago from standing landing on chest c/o abrasion to right chest and pain , also fell x 3 days ago with abrasion to left upper arm

## 2020-09-12 NOTE — ED Notes (Signed)
Pt A&OX4, ambulatory at d/c with cane, declined wheelchair.

## 2020-09-12 NOTE — ED Provider Notes (Signed)
[facility] DEPART[facility] Provider Note   CSN: [id number] Arrival date & time: 09/12/20  1850     History Chief Complaint  Patient presents with   [name]    Cory Gonzalez is a 87 y.o. male.  Patient with a fall about 6 hours ago.  Patient got his feet tangled up in the television cord.  And he fell on a magazine stand.  Complaining of pain to his right anterior chest.  He also had a fall 3 days ago with an abrasion to his left upper arm.  And an abrasion to his right upper arm.  Patient denies any leg pain.  He did not hit his head no loss of consciousness.  No neck pain.  No abdominal pain.  Patient has a history of atrial fibrillation.  Patient is on Xarelto.  Patient states his tetanus is up-to-date.      Past Medical History:  Diagnosis Date   Chronic diastolic heart failure ([facility])    FUO (fever of unknown origin) 07/27/2019   Heart murmur    since childhood, never has had any problems   Hypertension    Paroxysmal atrial fibrillation ([facility])    Pneumonia    PONV (postoperative nausea and vomiting)    a little nausea   Rheumatoid arthritis ([place])    Tick bite 07/27/2019    Patient Active Problem List   Diagnosis Date Noted   FUO (fever of unknown origin) 07/27/2019   Tick bite 07/27/2019   Pneumothorax on right 09/19/2017   Olecranon fracture 09/18/2017   Blepharitis of upper and lower eyelids of both eyes 04/19/2016   Pseudophakia of both eyes 04/19/2016   TB lung, latent 06/05/2011   Ocular hypertension, 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   Rheumatoid arthritis ([place]) 02/16/2004    Past Surgical History:  Procedure Laterality Date   CATARACT EXTRACTION Bilateral    COLONOSCOPY     ORIF ELBOW FRACTURE Right 09/18/2017   Procedure: OPEN REDUCTION INTERNAL FIXATION (ORIF) RIGHT ELBOW/OLECRANON FRACTURE;  Surgeon: [name], MD;  Location: [facility];  Service: Orthopedics;  Laterality: Right;   PROSTATE SURGERY         Family History  Problem Relation Age of Onset    COPD Sister    Dementia Sister    Aneurysm Brother     Social History   Tobacco Use   Smoking status: Every Day    Types: Pipe   Smokeless tobacco: Former  Scientific laboratory technician Use: Never used  Substance Use Topics   Alcohol use: No    Alcohol/week: 0.0 standard drinks   Drug use: No    Home Medications Prior to Admission medications   Medication Sig Start Date End Date Taking? Authorizing Provider  amLODipine (NORVASC) 10 MG tablet Take 10 mg by mouth daily.  08/06/11   [provider]  folic acid (FOLVITE) 1 MG tablet Take 3 mg by mouth daily.     [provider]  InFLIXimab (REMICADE IV) Inject into the vein every 8 (eight) weeks.    [provider]  latanoprost (XALATAN) 0.005 % ophthalmic solution Place 1 drop into both eyes at bedtime.  11/01/14   [provider]  lisinopril-hydrochlorothiazide (PRINZIDE,ZESTORETIC) 20-12.5 MG tablet Take 1 tablet by mouth daily. 11/03/15   [provider]  methotrexate (RHEUMATREX) 2.5 MG tablet Take 10 mg by mouth every Friday.  10/06/14   [provider]  Multiple Vitamin (MULITIVITAMIN WITH MINERALS) TABS Take 1 tablet by mouth daily.  [provider]  predniSONE (DELTASONE) 5 MG tablet Take 5 mg by mouth daily with breakfast.    [provider]  rivaroxaban (XARELTO) 15 MG TABS tablet Take 1 tablet (15 mg total) by mouth daily with supper. 09/17/17   Skeet Latch, MD    Allergies    Patient has no known allergies.  Review of Systems   Review of Systems  Constitutional:  Negative for chills and fever.  HENT:  Negative for ear pain and sore throat.   Eyes:  Negative for pain and visual disturbance.  Respiratory:  Positive for shortness of breath. Negative for cough.   Cardiovascular:  Positive for chest pain. Negative for palpitations.  Gastrointestinal:  Negative for abdominal pain and vomiting.  Genitourinary:  Negative for dysuria and hematuria.   Musculoskeletal:  Negative for arthralgias and back pain.  Skin:  Negative for color change and rash.  Neurological:  Negative for seizures and syncope.  All other systems reviewed and are negative.  Physical Exam Updated Vital Signs BP (!) 143/101   Pulse 75   Temp 98.3 F (36.8 C) (Oral)   Resp 16   Ht 1.626 m ('5\' 4"'$ )   Wt 63.5 kg   SpO2 96%   BMI 24.03 kg/m   Physical Exam Vitals and nursing note reviewed.  Constitutional:      Appearance: Normal appearance. He is well-developed.  HENT:     Head: Normocephalic and atraumatic.  Eyes:     Extraocular Movements: Extraocular movements intact.     Conjunctiva/sclera: Conjunctivae normal.     Pupils: Pupils are equal, round, and reactive to light.  Cardiovascular:     Rate and Rhythm: Normal rate and regular rhythm.     Heart sounds: No murmur heard. Pulmonary:     Effort: Pulmonary effort is normal. No respiratory distress.     Breath sounds: Normal breath sounds.     Comments: Abrasion to the right lower anterior chest measuring about 2 x 2 cm.  Tenderness to palpation kind of the left anterior upper rib area Chest:     Chest wall: Tenderness present.  Abdominal:     Palpations: Abdomen is soft.     Tenderness: There is no abdominal tenderness.  Musculoskeletal:     Cervical back: Neck supple.     Comments: Patient with skin tear well-healed to the left shoulder area.  And a an elliptical skin tear to the right shoulder area that is well-healed.  Skin:    General: Skin is warm and dry.  Neurological:     General: No focal deficit present.     Mental Status: He is alert and oriented to person, place, and time.     Cranial Nerves: No cranial nerve deficit.     Sensory: No sensory deficit.    ED Results / Procedures / Treatments   Labs (all labs ordered are listed, but only abnormal results are displayed) Labs Reviewed - No data to display  EKG None  Radiology DG Sternum  Result Date: 09/12/2020 CLINICAL  DATA:  Fall 6 hours prior from standing with right chest injury and pain EXAM: STERNUM - 2+ VIEW COMPARISON:  07/27/2019 chest radiograph. FINDINGS: Stable cardiomediastinal silhouette with normal heart size and probable small hiatal hernia. No pneumothorax. No pleural effusion. Mild bibasilar curvilinear scarring versus atelectasis. Chronic small eventration of the bilateral hemidiaphragms. No evidence a displaced sternal fracture. IMPRESSION: No evidence of displaced sternal fracture. Mild bibasilar scarring versus atelectasis. Electronically Signed   By:  Ilona Sorrel M.D.   On: 09/12/2020 19:44   CT Chest Wo Contrast  Result Date: 09/12/2020 CLINICAL DATA:  Fall several hours ago with chest pain, initial encounter EXAM: CT CHEST WITHOUT CONTRAST TECHNIQUE: Multidetector CT imaging of the chest was performed following the standard protocol without IV contrast. COMPARISON:  07/27/2019 FINDINGS: Cardiovascular: Atherosclerotic calcifications of the thoracic aorta are noted. No aneurysmal dilatation is seen. Coronary calcifications are noted. No cardiac enlargement is seen. Pulmonary artery is not significantly enlarged. Mediastinum/Nodes: Thoracic inlet is within normal limits. No sizable hilar or mediastinal adenopathy is noted. The esophagus as visualized is within normal limits with the exception of a moderate sliding-type hiatal hernia. Lungs/Pleura: Lungs are well aerated with diffuse emphysematous changes. Right lower lobe atelectatic changes are noted. Focal eventration of the diaphragm is noted posteriorly. No other focal abnormality is noted. Upper Abdomen: Left renal cyst is noted. No acute upper abdominal abnormality is noted. Musculoskeletal: Degenerative changes of the thoracic spine are noted. No acute rib abnormality is noted. No sternal fracture is seen. IMPRESSION: Hiatal hernia. Right lower lobe atelectasis No acute rib abnormality is noted. Aortic Atherosclerosis (ICD10-I70.0). Electronically  Signed   By: Inez Catalina M.D.   On: 09/12/2020 21:13    Procedures Procedures   Medications Ordered in ED Medications  HYDROcodone-acetaminophen (NORCO/VICODIN) 5-325 MG per tablet 1 tablet (1 tablet Oral Given 09/12/20 2038)    ED Course  I have reviewed the triage vital signs and the nursing notes.  Pertinent labs & imaging results that were available during my care of the patient were reviewed by me and considered in my medical decision making (see chart for details).    MDM Rules/Calculators/A&P                           Triage ordered sternal x-ray which was negative.  I ordered CT chest.  Which shows no evidence of any rib fractures.  Underlying lungs are normal little bit of atelectasis on the right side.  We will treat patient with incentive spirometer pain medicine and close follow-up with primary care doctor.  This is consistent with chest wall pain and probably does have at least rib contusions.  But no evidence of any fractures.   Final Clinical Impression(s) / ED Diagnoses Final diagnoses:  Fall, initial encounter  Chest wall pain  Rib contusion, right, initial encounter    Rx / DC Orders ED Discharge Orders     None        Fredia Sorrow, MD 09/12/20 2159

## 2020-09-12 NOTE — Discharge Instructions (Addendum)
Use the incentive spirometer.  Take the pain medicine as directed.  Make an appointment to follow-up with your doctors.  You may require additional pain medicine.  Return for any new or worse symptoms.

## 2020-09-15 DIAGNOSIS — W19XXXD Unspecified fall, subsequent encounter: Secondary | ICD-10-CM | POA: Diagnosis not present

## 2020-09-15 DIAGNOSIS — R0781 Pleurodynia: Secondary | ICD-10-CM | POA: Diagnosis not present

## 2020-09-28 DIAGNOSIS — I130 Hypertensive heart and chronic kidney disease with heart failure and stage 1 through stage 4 chronic kidney disease, or unspecified chronic kidney disease: Secondary | ICD-10-CM | POA: Diagnosis not present

## 2020-09-28 DIAGNOSIS — I5032 Chronic diastolic (congestive) heart failure: Secondary | ICD-10-CM | POA: Diagnosis not present

## 2020-09-28 DIAGNOSIS — N183 Chronic kidney disease, stage 3 unspecified: Secondary | ICD-10-CM | POA: Diagnosis not present

## 2020-09-28 DIAGNOSIS — M1990 Unspecified osteoarthritis, unspecified site: Secondary | ICD-10-CM | POA: Diagnosis not present

## 2020-10-17 DIAGNOSIS — H26492 Other secondary cataract, left eye: Secondary | ICD-10-CM | POA: Diagnosis not present

## 2020-10-17 DIAGNOSIS — H40053 Ocular hypertension, bilateral: Secondary | ICD-10-CM | POA: Diagnosis not present

## 2020-10-25 DIAGNOSIS — M0589 Other rheumatoid arthritis with rheumatoid factor of multiple sites: Secondary | ICD-10-CM | POA: Diagnosis not present

## 2020-11-14 DIAGNOSIS — Z23 Encounter for immunization: Secondary | ICD-10-CM | POA: Diagnosis not present

## 2020-12-01 DIAGNOSIS — Z23 Encounter for immunization: Secondary | ICD-10-CM | POA: Diagnosis not present

## 2020-12-20 DIAGNOSIS — M0589 Other rheumatoid arthritis with rheumatoid factor of multiple sites: Secondary | ICD-10-CM | POA: Diagnosis not present

## 2020-12-27 DIAGNOSIS — Z961 Presence of intraocular lens: Secondary | ICD-10-CM | POA: Diagnosis not present

## 2020-12-27 DIAGNOSIS — H26492 Other secondary cataract, left eye: Secondary | ICD-10-CM | POA: Diagnosis not present

## 2020-12-27 DIAGNOSIS — H40053 Ocular hypertension, bilateral: Secondary | ICD-10-CM | POA: Diagnosis not present

## 2021-02-02 DIAGNOSIS — R972 Elevated prostate specific antigen [PSA]: Secondary | ICD-10-CM | POA: Diagnosis not present

## 2021-02-06 DIAGNOSIS — I5032 Chronic diastolic (congestive) heart failure: Secondary | ICD-10-CM | POA: Diagnosis not present

## 2021-02-06 DIAGNOSIS — D692 Other nonthrombocytopenic purpura: Secondary | ICD-10-CM | POA: Diagnosis not present

## 2021-02-06 DIAGNOSIS — I129 Hypertensive chronic kidney disease with stage 1 through stage 4 chronic kidney disease, or unspecified chronic kidney disease: Secondary | ICD-10-CM | POA: Diagnosis not present

## 2021-02-06 DIAGNOSIS — J432 Centrilobular emphysema: Secondary | ICD-10-CM | POA: Diagnosis not present

## 2021-02-06 DIAGNOSIS — I77810 Thoracic aortic ectasia: Secondary | ICD-10-CM | POA: Diagnosis not present

## 2021-02-09 DIAGNOSIS — D291 Benign neoplasm of prostate: Secondary | ICD-10-CM | POA: Diagnosis not present

## 2021-02-09 DIAGNOSIS — R972 Elevated prostate specific antigen [PSA]: Secondary | ICD-10-CM | POA: Diagnosis not present

## 2021-02-13 DIAGNOSIS — I5032 Chronic diastolic (congestive) heart failure: Secondary | ICD-10-CM | POA: Diagnosis not present

## 2021-02-13 DIAGNOSIS — M0589 Other rheumatoid arthritis with rheumatoid factor of multiple sites: Secondary | ICD-10-CM | POA: Diagnosis not present

## 2021-02-13 DIAGNOSIS — I10 Essential (primary) hypertension: Secondary | ICD-10-CM | POA: Diagnosis not present

## 2021-02-13 DIAGNOSIS — I77810 Thoracic aortic ectasia: Secondary | ICD-10-CM | POA: Diagnosis not present

## 2021-02-13 DIAGNOSIS — Z79899 Other long term (current) drug therapy: Secondary | ICD-10-CM | POA: Diagnosis not present

## 2021-02-13 DIAGNOSIS — N1831 Chronic kidney disease, stage 3a: Secondary | ICD-10-CM | POA: Diagnosis not present

## 2021-02-13 DIAGNOSIS — I4891 Unspecified atrial fibrillation: Secondary | ICD-10-CM | POA: Diagnosis not present

## 2021-02-13 DIAGNOSIS — M549 Dorsalgia, unspecified: Secondary | ICD-10-CM | POA: Diagnosis not present

## 2021-02-13 DIAGNOSIS — J432 Centrilobular emphysema: Secondary | ICD-10-CM | POA: Diagnosis not present

## 2021-02-13 DIAGNOSIS — L600 Ingrowing nail: Secondary | ICD-10-CM | POA: Diagnosis not present

## 2021-02-13 DIAGNOSIS — M4120 Other idiopathic scoliosis, site unspecified: Secondary | ICD-10-CM | POA: Diagnosis not present

## 2021-02-13 DIAGNOSIS — R7612 Nonspecific reaction to cell mediated immunity measurement of gamma interferon antigen response without active tuberculosis: Secondary | ICD-10-CM | POA: Diagnosis not present

## 2021-02-13 DIAGNOSIS — N182 Chronic kidney disease, stage 2 (mild): Secondary | ICD-10-CM | POA: Diagnosis not present

## 2021-02-13 DIAGNOSIS — D692 Other nonthrombocytopenic purpura: Secondary | ICD-10-CM | POA: Diagnosis not present

## 2021-02-13 DIAGNOSIS — M1990 Unspecified osteoarthritis, unspecified site: Secondary | ICD-10-CM | POA: Diagnosis not present

## 2021-02-14 DIAGNOSIS — M0589 Other rheumatoid arthritis with rheumatoid factor of multiple sites: Secondary | ICD-10-CM | POA: Diagnosis not present

## 2021-02-14 DIAGNOSIS — Z79899 Other long term (current) drug therapy: Secondary | ICD-10-CM | POA: Diagnosis not present

## 2021-02-14 DIAGNOSIS — R5383 Other fatigue: Secondary | ICD-10-CM | POA: Diagnosis not present

## 2021-02-22 ENCOUNTER — Encounter: Payer: Self-pay | Admitting: Podiatry

## 2021-02-22 ENCOUNTER — Ambulatory Visit (INDEPENDENT_AMBULATORY_CARE_PROVIDER_SITE_OTHER): Payer: Medicare Other | Admitting: Podiatry

## 2021-02-22 ENCOUNTER — Other Ambulatory Visit: Payer: Self-pay

## 2021-02-22 DIAGNOSIS — B351 Tinea unguium: Secondary | ICD-10-CM | POA: Diagnosis not present

## 2021-02-22 DIAGNOSIS — L600 Ingrowing nail: Secondary | ICD-10-CM | POA: Diagnosis not present

## 2021-02-22 NOTE — Patient Instructions (Signed)

## 2021-02-25 NOTE — Progress Notes (Signed)
Subjective:   Patient ID: Cory Gonzalez, male   DOB: 88 y.o.   MRN: [id number]   HPI Patient presents stating that he has developed a painful ingrown toenail on his left big toe and is concerned about discoloration of his nailbeds.  States he is tried to trim and soak it without relief and its been getting more bothersome for him.  Presents with caregiver and does not smoke likes to be active   Review of Systems  All other systems reviewed and are negative.      Objective:  Physical Exam Vitals and nursing note reviewed.  Constitutional:      Appearance: He is well-developed.  Pulmonary:     Effort: Pulmonary effort is normal.  Musculoskeletal:        General: Normal range of motion.  Skin:    General: Skin is warm.  Neurological:     Mental Status: He is alert.    Neurovascular status intact muscle strength adequate range of motion was found to be adequate for his age with good digital perfusion.  Patient is found to be well oriented and has incurvated medial border left hallux very painful no drainage no redness was noted just soreness with palpation with damage to the nail plate and discoloration of adjacent nails     Assessment:  Chronic ingrown toenail deformity left hallux medial border with pain along with nail disease bilateral     Plan:  H&P reviewed conditions discussed treatment options and I recommended correction of ingrown toenail explaining procedure and risk.  Patient wants surgery and signed consent form after reviewing today I infiltrated the left hallux 60 mg like Marcaine mixture sterile prep done and using sterile instrumentation remove the border exposed matrix applied phenol 3 applications 30 seconds followed by alcohol lavage sterile dressing and gave instructions on soaks.  Encouraged to leave dressing on 24 hours but take it off earlier if any throbbing were to occur and patient will be seen back to recheck

## 2021-03-08 DIAGNOSIS — L570 Actinic keratosis: Secondary | ICD-10-CM | POA: Diagnosis not present

## 2021-03-08 DIAGNOSIS — X32XXXD Exposure to sunlight, subsequent encounter: Secondary | ICD-10-CM | POA: Diagnosis not present

## 2021-03-08 DIAGNOSIS — C44222 Squamous cell carcinoma of skin of right ear and external auricular canal: Secondary | ICD-10-CM | POA: Diagnosis not present

## 2021-03-08 DIAGNOSIS — C44319 Basal cell carcinoma of skin of other parts of face: Secondary | ICD-10-CM | POA: Diagnosis not present

## 2021-04-05 DIAGNOSIS — X32XXXD Exposure to sunlight, subsequent encounter: Secondary | ICD-10-CM | POA: Diagnosis not present

## 2021-04-05 DIAGNOSIS — Z85828 Personal history of other malignant neoplasm of skin: Secondary | ICD-10-CM | POA: Diagnosis not present

## 2021-04-05 DIAGNOSIS — L570 Actinic keratosis: Secondary | ICD-10-CM | POA: Diagnosis not present

## 2021-04-05 DIAGNOSIS — Z08 Encounter for follow-up examination after completed treatment for malignant neoplasm: Secondary | ICD-10-CM | POA: Diagnosis not present

## 2021-04-11 DIAGNOSIS — M0589 Other rheumatoid arthritis with rheumatoid factor of multiple sites: Secondary | ICD-10-CM | POA: Diagnosis not present

## 2021-04-20 DIAGNOSIS — I4891 Unspecified atrial fibrillation: Secondary | ICD-10-CM | POA: Diagnosis not present

## 2021-04-28 DIAGNOSIS — I5032 Chronic diastolic (congestive) heart failure: Secondary | ICD-10-CM | POA: Diagnosis not present

## 2021-04-28 DIAGNOSIS — M1990 Unspecified osteoarthritis, unspecified site: Secondary | ICD-10-CM | POA: Diagnosis not present

## 2021-04-28 DIAGNOSIS — I130 Hypertensive heart and chronic kidney disease with heart failure and stage 1 through stage 4 chronic kidney disease, or unspecified chronic kidney disease: Secondary | ICD-10-CM | POA: Diagnosis not present

## 2021-04-28 DIAGNOSIS — N183 Chronic kidney disease, stage 3 unspecified: Secondary | ICD-10-CM | POA: Diagnosis not present

## 2021-06-02 DIAGNOSIS — S471XXA Crushing injury of right shoulder and upper arm, initial encounter: Secondary | ICD-10-CM | POA: Diagnosis not present

## 2021-06-02 DIAGNOSIS — Y999 Unspecified external cause status: Secondary | ICD-10-CM | POA: Diagnosis not present

## 2021-06-02 DIAGNOSIS — W28XXXA Contact with powered lawn mower, initial encounter: Secondary | ICD-10-CM | POA: Diagnosis not present

## 2021-06-02 DIAGNOSIS — R42 Dizziness and giddiness: Secondary | ICD-10-CM | POA: Diagnosis not present

## 2021-06-02 DIAGNOSIS — M5137 Other intervertebral disc degeneration, lumbosacral region: Secondary | ICD-10-CM | POA: Diagnosis not present

## 2021-06-02 DIAGNOSIS — M4312 Spondylolisthesis, cervical region: Secondary | ICD-10-CM | POA: Diagnosis not present

## 2021-06-02 DIAGNOSIS — T1490XA Injury, unspecified, initial encounter: Secondary | ICD-10-CM | POA: Diagnosis not present

## 2021-06-02 DIAGNOSIS — S199XXA Unspecified injury of neck, initial encounter: Secondary | ICD-10-CM | POA: Diagnosis not present

## 2021-06-02 DIAGNOSIS — I700 Atherosclerosis of aorta: Secondary | ICD-10-CM | POA: Diagnosis not present

## 2021-06-02 DIAGNOSIS — I6529 Occlusion and stenosis of unspecified carotid artery: Secondary | ICD-10-CM | POA: Diagnosis not present

## 2021-06-02 DIAGNOSIS — I672 Cerebral atherosclerosis: Secondary | ICD-10-CM | POA: Diagnosis not present

## 2021-06-02 DIAGNOSIS — S3991XA Unspecified injury of abdomen, initial encounter: Secondary | ICD-10-CM | POA: Diagnosis not present

## 2021-06-02 DIAGNOSIS — R58 Hemorrhage, not elsewhere classified: Secondary | ICD-10-CM | POA: Diagnosis not present

## 2021-06-02 DIAGNOSIS — M419 Scoliosis, unspecified: Secondary | ICD-10-CM | POA: Diagnosis not present

## 2021-06-02 DIAGNOSIS — M5134 Other intervertebral disc degeneration, thoracic region: Secondary | ICD-10-CM | POA: Diagnosis not present

## 2021-06-02 DIAGNOSIS — I6782 Cerebral ischemia: Secondary | ICD-10-CM | POA: Diagnosis not present

## 2021-06-02 DIAGNOSIS — Z041 Encounter for examination and observation following transport accident: Secondary | ICD-10-CM | POA: Diagnosis not present

## 2021-06-02 DIAGNOSIS — S51811A Laceration without foreign body of right forearm, initial encounter: Secondary | ICD-10-CM | POA: Diagnosis not present

## 2021-06-02 DIAGNOSIS — S299XXA Unspecified injury of thorax, initial encounter: Secondary | ICD-10-CM | POA: Diagnosis not present

## 2021-06-02 DIAGNOSIS — M5136 Other intervertebral disc degeneration, lumbar region: Secondary | ICD-10-CM | POA: Diagnosis not present

## 2021-06-02 DIAGNOSIS — K449 Diaphragmatic hernia without obstruction or gangrene: Secondary | ICD-10-CM | POA: Diagnosis not present

## 2021-06-02 DIAGNOSIS — R110 Nausea: Secondary | ICD-10-CM | POA: Diagnosis not present

## 2021-06-02 DIAGNOSIS — M5033 Other cervical disc degeneration, cervicothoracic region: Secondary | ICD-10-CM | POA: Diagnosis not present

## 2021-06-02 DIAGNOSIS — R069 Unspecified abnormalities of breathing: Secondary | ICD-10-CM | POA: Diagnosis not present

## 2021-06-02 DIAGNOSIS — Z23 Encounter for immunization: Secondary | ICD-10-CM | POA: Diagnosis not present

## 2021-06-02 DIAGNOSIS — S41111A Laceration without foreign body of right upper arm, initial encounter: Secondary | ICD-10-CM | POA: Diagnosis not present

## 2021-06-05 DIAGNOSIS — I77810 Thoracic aortic ectasia: Secondary | ICD-10-CM | POA: Diagnosis not present

## 2021-06-05 DIAGNOSIS — I129 Hypertensive chronic kidney disease with stage 1 through stage 4 chronic kidney disease, or unspecified chronic kidney disease: Secondary | ICD-10-CM | POA: Diagnosis not present

## 2021-06-05 DIAGNOSIS — M0589 Other rheumatoid arthritis with rheumatoid factor of multiple sites: Secondary | ICD-10-CM | POA: Diagnosis not present

## 2021-06-05 DIAGNOSIS — N183 Chronic kidney disease, stage 3 unspecified: Secondary | ICD-10-CM | POA: Diagnosis not present

## 2021-06-05 DIAGNOSIS — R5383 Other fatigue: Secondary | ICD-10-CM | POA: Diagnosis not present

## 2021-06-05 DIAGNOSIS — N182 Chronic kidney disease, stage 2 (mild): Secondary | ICD-10-CM | POA: Diagnosis not present

## 2021-06-05 DIAGNOSIS — Z79899 Other long term (current) drug therapy: Secondary | ICD-10-CM | POA: Diagnosis not present

## 2021-06-07 DIAGNOSIS — M0589 Other rheumatoid arthritis with rheumatoid factor of multiple sites: Secondary | ICD-10-CM | POA: Diagnosis not present

## 2021-06-12 DIAGNOSIS — I5032 Chronic diastolic (congestive) heart failure: Secondary | ICD-10-CM | POA: Diagnosis not present

## 2021-06-12 DIAGNOSIS — I10 Essential (primary) hypertension: Secondary | ICD-10-CM | POA: Diagnosis not present

## 2021-06-12 DIAGNOSIS — Z0000 Encounter for general adult medical examination without abnormal findings: Secondary | ICD-10-CM | POA: Diagnosis not present

## 2021-06-12 DIAGNOSIS — D692 Other nonthrombocytopenic purpura: Secondary | ICD-10-CM | POA: Diagnosis not present

## 2021-06-12 DIAGNOSIS — N182 Chronic kidney disease, stage 2 (mild): Secondary | ICD-10-CM | POA: Diagnosis not present

## 2021-06-12 DIAGNOSIS — I4891 Unspecified atrial fibrillation: Secondary | ICD-10-CM | POA: Diagnosis not present

## 2021-06-12 DIAGNOSIS — D6869 Other thrombophilia: Secondary | ICD-10-CM | POA: Diagnosis not present

## 2021-06-12 DIAGNOSIS — J432 Centrilobular emphysema: Secondary | ICD-10-CM | POA: Diagnosis not present

## 2021-06-12 DIAGNOSIS — E0590 Thyrotoxicosis, unspecified without thyrotoxic crisis or storm: Secondary | ICD-10-CM | POA: Diagnosis not present

## 2021-06-12 DIAGNOSIS — M0589 Other rheumatoid arthritis with rheumatoid factor of multiple sites: Secondary | ICD-10-CM | POA: Diagnosis not present

## 2021-06-12 DIAGNOSIS — M150 Primary generalized (osteo)arthritis: Secondary | ICD-10-CM | POA: Diagnosis not present

## 2021-06-12 DIAGNOSIS — I77810 Thoracic aortic ectasia: Secondary | ICD-10-CM | POA: Diagnosis not present

## 2021-06-27 DIAGNOSIS — Z961 Presence of intraocular lens: Secondary | ICD-10-CM | POA: Diagnosis not present

## 2021-06-27 DIAGNOSIS — H40053 Ocular hypertension, bilateral: Secondary | ICD-10-CM | POA: Diagnosis not present

## 2021-06-27 DIAGNOSIS — H5030 Unspecified intermittent heterotropia: Secondary | ICD-10-CM | POA: Diagnosis not present

## 2021-06-27 DIAGNOSIS — H524 Presbyopia: Secondary | ICD-10-CM | POA: Diagnosis not present

## 2021-06-27 DIAGNOSIS — H04123 Dry eye syndrome of bilateral lacrimal glands: Secondary | ICD-10-CM | POA: Diagnosis not present

## 2021-06-27 DIAGNOSIS — H0100B Unspecified blepharitis left eye, upper and lower eyelids: Secondary | ICD-10-CM | POA: Diagnosis not present

## 2021-06-27 DIAGNOSIS — H5203 Hypermetropia, bilateral: Secondary | ICD-10-CM | POA: Diagnosis not present

## 2021-06-27 DIAGNOSIS — H52203 Unspecified astigmatism, bilateral: Secondary | ICD-10-CM | POA: Diagnosis not present

## 2021-06-27 DIAGNOSIS — H0100A Unspecified blepharitis right eye, upper and lower eyelids: Secondary | ICD-10-CM | POA: Diagnosis not present

## 2021-06-27 DIAGNOSIS — H26492 Other secondary cataract, left eye: Secondary | ICD-10-CM | POA: Diagnosis not present

## 2021-08-04 DIAGNOSIS — M0589 Other rheumatoid arthritis with rheumatoid factor of multiple sites: Secondary | ICD-10-CM | POA: Diagnosis not present

## 2021-08-14 DIAGNOSIS — M1990 Unspecified osteoarthritis, unspecified site: Secondary | ICD-10-CM | POA: Diagnosis not present

## 2021-08-14 DIAGNOSIS — I10 Essential (primary) hypertension: Secondary | ICD-10-CM | POA: Diagnosis not present

## 2021-08-14 DIAGNOSIS — M549 Dorsalgia, unspecified: Secondary | ICD-10-CM | POA: Diagnosis not present

## 2021-08-14 DIAGNOSIS — M0589 Other rheumatoid arthritis with rheumatoid factor of multiple sites: Secondary | ICD-10-CM | POA: Diagnosis not present

## 2021-08-14 DIAGNOSIS — R7612 Nonspecific reaction to cell mediated immunity measurement of gamma interferon antigen response without active tuberculosis: Secondary | ICD-10-CM | POA: Diagnosis not present

## 2021-08-14 DIAGNOSIS — Z79899 Other long term (current) drug therapy: Secondary | ICD-10-CM | POA: Diagnosis not present

## 2021-08-14 DIAGNOSIS — M4120 Other idiopathic scoliosis, site unspecified: Secondary | ICD-10-CM | POA: Diagnosis not present

## 2021-08-14 DIAGNOSIS — N1831 Chronic kidney disease, stage 3a: Secondary | ICD-10-CM | POA: Diagnosis not present

## 2021-09-04 IMAGING — CR DG CHEST 2V
2 series · 2 of 2 positions shown · non-contrast
Comparison: 07/16/2019, 10/09/2017

CLINICAL DATA: Short of breath, chills, fever unknown origin,
history of tick bite

EXAM:
CHEST - 2 VIEW

[w chest pa]
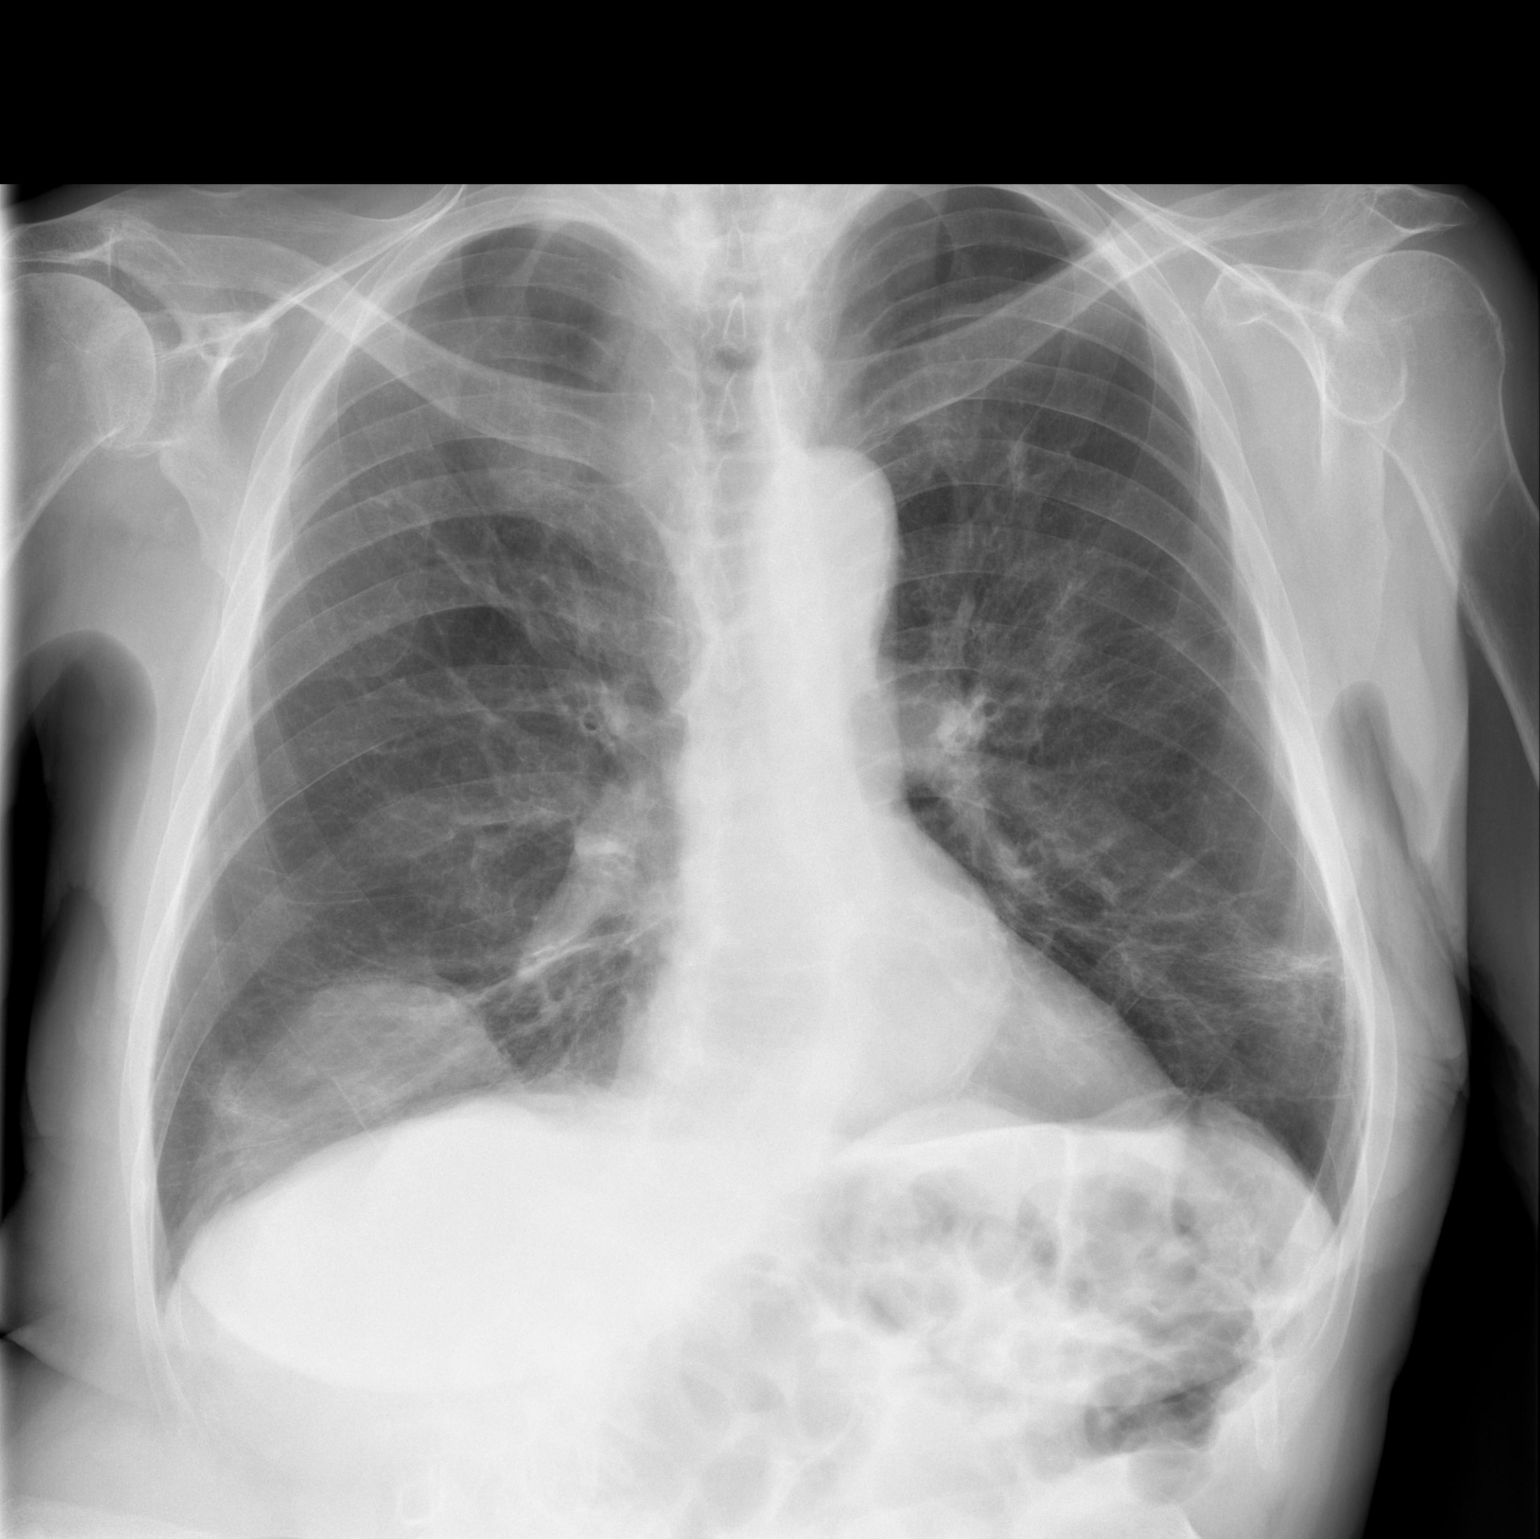

[w chest lat]
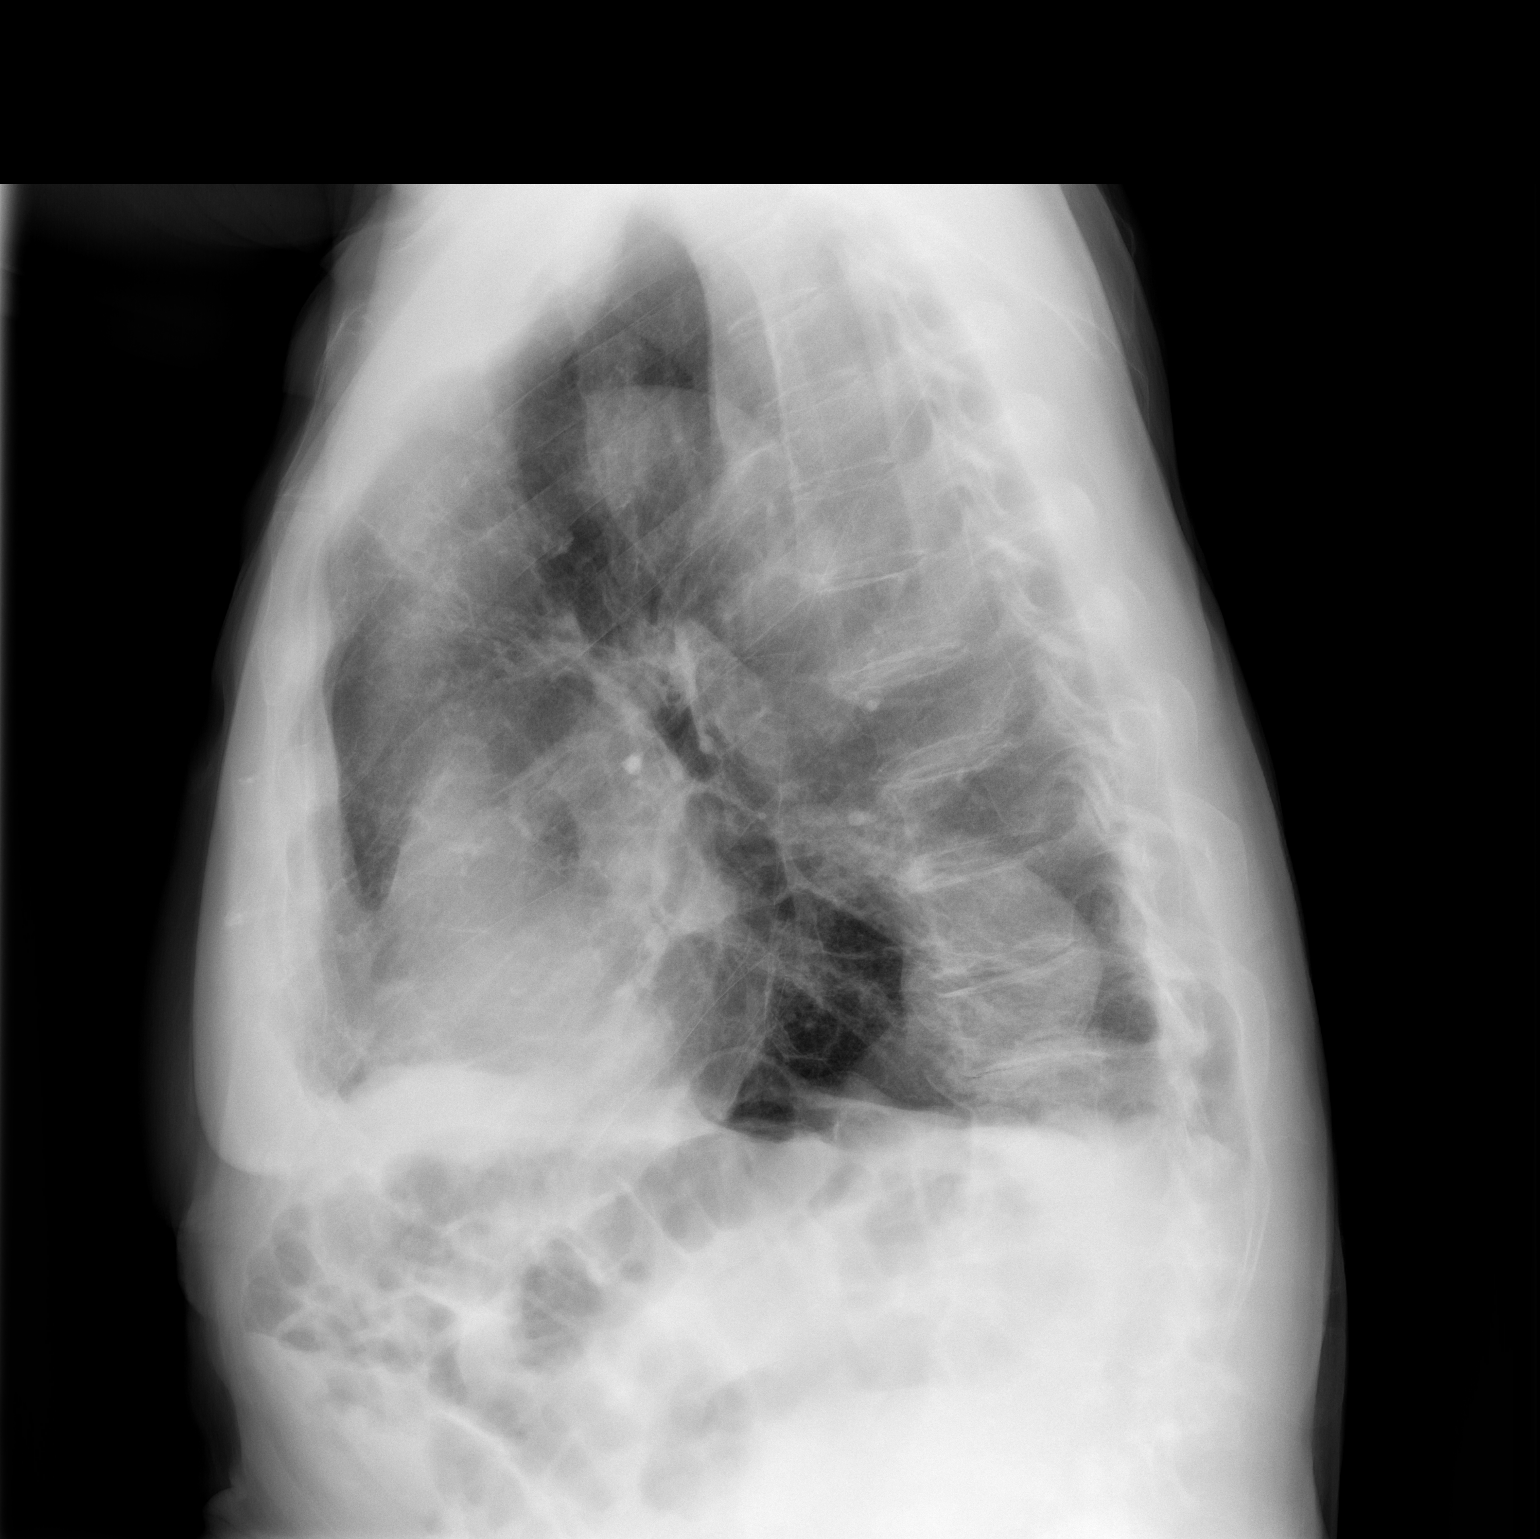

[2 of 2 positions shown; findings below may reference images not displayed]

FINDINGS: Frontal and lateral views of the chest demonstrate a stable cardiac
silhouette. Chronic soft tissue density in the right paratracheal
region consistent with vascular shadow. Stable areas of scarring are
seen throughout the lungs, greatest in the left lung base. The
subtle ground-glass airspace disease seen on recent chest x-ray is
no longer identified on this exam. No effusion or pneumothorax.
Lungs remain hyperinflated. Chronic eventration of the right
hemidiaphragm.
IMPRESSION: 1. Background emphysema, with parenchymal scarring and
hyperinflation.
2. No acute airspace disease.

## 2021-09-29 DIAGNOSIS — M0589 Other rheumatoid arthritis with rheumatoid factor of multiple sites: Secondary | ICD-10-CM | POA: Diagnosis not present

## 2021-10-04 DIAGNOSIS — Z08 Encounter for follow-up examination after completed treatment for malignant neoplasm: Secondary | ICD-10-CM | POA: Diagnosis not present

## 2021-10-04 DIAGNOSIS — L570 Actinic keratosis: Secondary | ICD-10-CM | POA: Diagnosis not present

## 2021-10-04 DIAGNOSIS — X32XXXD Exposure to sunlight, subsequent encounter: Secondary | ICD-10-CM | POA: Diagnosis not present

## 2021-10-04 DIAGNOSIS — Z85828 Personal history of other malignant neoplasm of skin: Secondary | ICD-10-CM | POA: Diagnosis not present

## 2021-11-23 DIAGNOSIS — H532 Diplopia: Secondary | ICD-10-CM | POA: Diagnosis not present

## 2021-11-23 DIAGNOSIS — H26492 Other secondary cataract, left eye: Secondary | ICD-10-CM | POA: Diagnosis not present

## 2021-11-23 DIAGNOSIS — H40053 Ocular hypertension, bilateral: Secondary | ICD-10-CM | POA: Diagnosis not present

## 2021-11-24 ENCOUNTER — Emergency Department (HOSPITAL_COMMUNITY): Payer: Medicare Other

## 2021-11-24 ENCOUNTER — Other Ambulatory Visit: Payer: Self-pay

## 2021-11-24 ENCOUNTER — Emergency Department (HOSPITAL_COMMUNITY)
Admission: EM | Admit: 2021-11-24 | Discharge: 2021-11-24 | Disposition: A | Discharge status: Home / Self Care | Payer: Medicare Other | Attending: Emergency Medicine | Admitting: Emergency Medicine

## 2021-11-24 ENCOUNTER — Encounter (HOSPITAL_COMMUNITY): Payer: Self-pay

## 2021-11-24 DIAGNOSIS — M4120 Other idiopathic scoliosis, site unspecified: Secondary | ICD-10-CM | POA: Diagnosis not present

## 2021-11-24 DIAGNOSIS — M79632 Pain in left forearm: Secondary | ICD-10-CM | POA: Diagnosis not present

## 2021-11-24 DIAGNOSIS — M7981 Nontraumatic hematoma of soft tissue: Secondary | ICD-10-CM | POA: Diagnosis not present

## 2021-11-24 DIAGNOSIS — X501XXA Overexertion from prolonged static or awkward postures, initial encounter: Secondary | ICD-10-CM | POA: Diagnosis not present

## 2021-11-24 DIAGNOSIS — Z7901 Long term (current) use of anticoagulants: Secondary | ICD-10-CM | POA: Diagnosis not present

## 2021-11-24 DIAGNOSIS — I4891 Unspecified atrial fibrillation: Secondary | ICD-10-CM | POA: Insufficient documentation

## 2021-11-24 DIAGNOSIS — I10 Essential (primary) hypertension: Secondary | ICD-10-CM | POA: Diagnosis not present

## 2021-11-24 DIAGNOSIS — M7989 Other specified soft tissue disorders: Secondary | ICD-10-CM | POA: Insufficient documentation

## 2021-11-24 DIAGNOSIS — T148XXA Other injury of unspecified body region, initial encounter: Secondary | ICD-10-CM

## 2021-11-24 DIAGNOSIS — N1831 Chronic kidney disease, stage 3a: Secondary | ICD-10-CM | POA: Diagnosis not present

## 2021-11-24 DIAGNOSIS — R7612 Nonspecific reaction to cell mediated immunity measurement of gamma interferon antigen response without active tuberculosis: Secondary | ICD-10-CM | POA: Diagnosis not present

## 2021-11-24 DIAGNOSIS — M549 Dorsalgia, unspecified: Secondary | ICD-10-CM | POA: Diagnosis not present

## 2021-11-24 DIAGNOSIS — M1990 Unspecified osteoarthritis, unspecified site: Secondary | ICD-10-CM | POA: Diagnosis not present

## 2021-11-24 DIAGNOSIS — I7025 Atherosclerosis of native arteries of other extremities with ulceration: Secondary | ICD-10-CM | POA: Diagnosis not present

## 2021-11-24 DIAGNOSIS — Z79899 Other long term (current) drug therapy: Secondary | ICD-10-CM | POA: Diagnosis not present

## 2021-11-24 DIAGNOSIS — M0589 Other rheumatoid arthritis with rheumatoid factor of multiple sites: Secondary | ICD-10-CM | POA: Diagnosis not present

## 2021-11-24 DIAGNOSIS — I721 Aneurysm of artery of upper extremity: Secondary | ICD-10-CM

## 2021-11-24 LAB — CBC WITH DIFFERENTIAL/PLATELET
Abs Immature Granulocytes: 0.08 10*3/uL — ABNORMAL HIGH (ref 0.00–0.07)
Basophils Absolute: 0 10*3/uL (ref 0.0–0.1)
Basophils Relative: 0 %
Eosinophils Absolute: 0 10*3/uL (ref 0.0–0.5)
Eosinophils Relative: 0 %
HCT: 31.7 % — ABNORMAL LOW (ref 39.0–52.0)
Hemoglobin: 10.8 g/dL — ABNORMAL LOW (ref 13.0–17.0)
Immature Granulocytes: 1 %
Lymphocytes Relative: 4 %
Lymphs Abs: 0.3 10*3/uL — ABNORMAL LOW (ref 0.7–4.0)
MCH: 35.2 pg — ABNORMAL HIGH (ref 26.0–34.0)
MCHC: 34.1 g/dL (ref 30.0–36.0)
MCV: 103.3 fL — ABNORMAL HIGH (ref 80.0–100.0)
Monocytes Absolute: 0.6 10*3/uL (ref 0.1–1.0)
Monocytes Relative: 6 %
Neutro Abs: 7.6 10*3/uL (ref 1.7–7.7)
Neutrophils Relative %: 89 %
Platelets: 219 10*3/uL (ref 150–400)
RBC: 3.07 MIL/uL — ABNORMAL LOW (ref 4.22–5.81)
RDW: 15.8 % — ABNORMAL HIGH (ref 11.5–15.5)
WBC: 8.6 10*3/uL (ref 4.0–10.5)
nRBC: 0 % (ref 0.0–0.2)

## 2021-11-24 LAB — BASIC METABOLIC PANEL
Anion gap: 9 (ref 5–15)
BUN: 15 mg/dL (ref 8–23)
CO2: 24 mmol/L (ref 22–32)
Calcium: 9.1 mg/dL (ref 8.9–10.3)
Chloride: 106 mmol/L (ref 98–111)
Creatinine, Ser: 0.96 mg/dL (ref 0.61–1.24)
GFR, Estimated: >60 mL/min (ref >60)
Glucose, Bld: 113 mg/dL — ABNORMAL HIGH (ref 70–99)
Potassium: 4.1 mmol/L (ref 3.5–5.1)
Sodium: 139 mmol/L (ref 135–145)

## 2021-11-24 MED ORDER — IOHEXOL 350 MG/ML SOLN
75.0000 mL | Freq: Once | INTRAVENOUS | Status: AC | PRN
  Administered 2021-11-24: 75 mL via INTRAVENOUS

## 2021-11-24 NOTE — ED Provider Notes (Signed)
[facility] EMERGENCY DEPARTMENT Provider Note   CSN: [id number] Arrival date & time: 11/24/21  1051     History  Chief Complaint  Patient presents with   Coagulation Disorder    Washington Whedbee is a 88 y.o. male presenting to the ED with ecchymosis of the left upper arm.  The patient is on Xarelto paroxysmal A-fib.  He reports he was lifting a very heavy ham 4 days ago, that he felt a sharp "pop" in his upper arm, and then began having swelling and bruising, which is now extended down his entire left arm.  He went to the [facility] urgent care clinic today and was referred into the ED for further evaluation.  HPI     Home Medications Prior to Admission medications   Medication Sig Start Date End Date Taking? Authorizing Provider  amLODipine (NORVASC) 10 MG tablet Take 10 mg by mouth daily.  08/06/11   [provider]  folic acid (FOLVITE) 1 MG tablet Take 3 mg by mouth daily.     [provider]  HYDROcodone-acetaminophen (NORCO/VICODIN) 5-325 MG tablet Take 1 tablet by mouth every 6 (six) hours as needed for moderate pain. 09/12/20   Fredia Sorrow, MD  InFLIXimab (REMICADE IV) Inject into the vein every 8 (eight) weeks.    [provider]  latanoprost (XALATAN) 0.005 % ophthalmic solution Place 1 drop into both eyes at bedtime.  11/01/14   [provider]  lisinopril-hydrochlorothiazide (PRINZIDE,ZESTORETIC) 20-12.5 MG tablet Take 1 tablet by mouth daily. 11/03/15   [provider]  methotrexate (RHEUMATREX) 2.5 MG tablet Take 10 mg by mouth every Friday.  10/06/14   [provider]  Multiple Vitamin (MULITIVITAMIN WITH MINERALS) TABS Take 1 tablet by mouth daily.    [provider]  predniSONE (DELTASONE) 5 MG tablet Take 5 mg by mouth daily with breakfast.    [provider]  rivaroxaban (XARELTO) 15 MG TABS tablet Take 1 tablet (15 mg total) by mouth daily with supper. 09/17/17   Skeet Latch, MD      Allergies    Patient has no known allergies.    Review of Systems   Review of Systems  Physical Exam Updated Vital Signs BP (!) 138/98   Pulse 68   Temp 98 F (36.7 C)   Resp 16   SpO2 100%  Physical Exam Constitutional:      General: He is not in acute distress. HENT:     Head: Normocephalic and atraumatic.  Eyes:     Conjunctiva/sclera: Conjunctivae normal.     Pupils: Pupils are equal, round, and reactive to light.  Cardiovascular:     Rate and Rhythm: Normal rate and regular rhythm.  Pulmonary:     Effort: Pulmonary effort is normal. No respiratory distress.  Musculoskeletal:     Comments: Mild swelling of left arm, diffuse ecchymosis of the left arm  Skin:    General: Skin is warm and dry.  Neurological:     General: No focal deficit present.     Mental Status: He is alert. Mental status is at baseline.  Psychiatric:        Mood and Affect: Mood normal.        Behavior: Behavior normal.     ED Results / Procedures / Treatments   Labs (all labs ordered are listed, but only abnormal results are displayed) Labs Reviewed  CBC WITH DIFFERENTIAL/PLATELET - Abnormal; Notable for the following components:  Result Value   RBC 3.07 (*)    Hemoglobin 10.8 (*)    HCT 31.7 (*)    MCV 103.3 (*)    MCH 35.2 (*)    RDW 15.8 (*)    Lymphs Abs 0.3 (*)    Abs Immature Granulocytes 0.08 (*)    All other components within normal limits  BASIC METABOLIC PANEL - Abnormal; Notable for the following components:   Glucose, Bld 113 (*)    All other components within normal limits    EKG None  Radiology CT ANGIO UP EXTREM LEFT W &/OR WO CONTAST  Result Date: 11/24/2021 CLINICAL DATA:  Vascular soft tissue mass, left arm bruising, bleeding EXAM: CT ANGIOGRAPHY OF THE LEFT UPPEREXTREMITY TECHNIQUE: Multidetector CT imaging of the left upperwas performed using the standard protocol during bolus administration of intravenous contrast. Multiplanar CT  image reconstructions and MIPs were obtained to evaluate the vascular anatomy. RADIATION DOSE REDUCTION: This exam was performed according to the departmental dose-optimization program which includes automated exposure control, adjustment of the mA and/or kV according to patient size and/or use of iterative reconstruction technique. CONTRAST:  5m OMNIPAQUE IOHEXOL 350 MG/ML SOLN COMPARISON:  None Available. FINDINGS: The aortic arch is of normal caliber. Classic arch vessel anatomy with wide patency of the arch vasculature proximally. Less than 50% stenosis of the left cervical internal carotid artery at its origin. There is an ulcerated plaque involving the proximal left subclavian artery just distal to the left vertebral artery origin which results in a 50-75% stenosis just proximal to the anterior scalene muscle. Subsequently, focal calcified atherosclerotic plaque results in a 50% stenosis of the left vessel at the axillosubclavian junction. Distally, the left axillary artery and brachial artery are widely patent without evidence focal stenosis, dissection, or pseudoaneurysm. Normal configuration of the right radial, ulnar, and interosseous arteries of wide patency of these vessels proximally. The ulnar artery at the level of the a hook of the hamate appears focally aneurysmal measuring 2-3 mm best seen on image # 311/7, a finding that can be seen in the setting of underlying trauma (hypothenar hammer syndrome). The superficial palmar March appears incomplete with the third and fourth common digital arteries opacified only. The distal left radial artery appears diminutive at the level of the proximal carpal row and the deep palmar arch is not well opacified on this examination. There is enlargement and heterogeneous hyperdensity of the muscle belly of the biceps brachii musculature suggesting an intramuscular hematoma. No active extravasation or underlying vascular aneurysm is identified. There is extensive  subcutaneous edema involving the left forearm, asymmetrically more severe along the ulnar aspect which extends into the dorsum of the left hand and extends proximally to the level of the distal humeral diaphysis. Incidentally noted is extensive coronary artery calcification. At least mild global cardiomegaly, incompletely evaluated on this examination. Calcification of the aortic valve leaflets noted Review of the MIP images confirms the above findings. IMPRESSION: 1. Asymmetric enlargement and heterogeneous hyperdensity of the muscle belly of the left biceps brachii musculature suggesting an intramuscular hematoma. No active extravasation or underlying vascular aneurysm identified. 2. 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 This is a finding that can be seen in the setting of underlying trauma (hypothenar hammer syndrome). 3. 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 Ulcerated atherosclerotic plaque involving the proximal left subclavian artery just distal to the left vertebral artery origin which results in a 50-75% stenosis  just proximal to the anterior scalene muscle. Subsequently, focal calcified atherosclerotic plaque results in a 50% stenosis of the left vessel at the axillosubclavian junction. 4. The distal left radial artery appears diminutive at the level of the proximal carpal row and the deep palmar arch is not well opacified on this examination. This, as well as findings within the terminal ulnar artery, may be better assessed with formal arteriography. 5. Extensive subcutaneous edema involving the left forearm, dorsum of the left hand. Aortic Atherosclerosis (ICD10-I70.0). Electronically Signed   By: Fidela Salisbury M.D.   On: 11/24/2021 19:34   [place] LT UPPER EXTREM LTD SOFT TISSUE NON VASCULAR  Result Date: 11/24/2021 CLINICAL DATA:  Left upper arm anterior palpable area of pain over biceps area. Entire arm  erythematous. EXAM: ULTRASOUND LEFT UPPER EXTREMITY LIMITED TECHNIQUE: Ultrasound examination of the upper extremity soft tissues was performed in the area of clinical concern. COMPARISON:  None Available. FINDINGS: In the area of interest of the left upper arm where there is an apparent palpable abnormality, there is heterogeneous, predominantly hypoechoic, region with smooth but mildly irregular and lobular borders, and no internal color flow vascularity measuring up to approximately 9.0 x 3.8 x 5.3 cm (craniocaudal by AP by transverse dimensions). This appears to be within the biceps musculature. IMPRESSION: In the area of interest of the left upper arm biceps musculature, there is a hypoechoic, heterogeneous region within the muscle that is nonspecific. This may represent an interstitial muscle tear and posttraumatic seroma/hematoma. An abscess is also possible. Although no definitive nodular region is seen, a mass is difficult to entirely exclude on the provided imaging. Recommend clinical correlation. Electronically Signed   By: Yvonne Kendall M.D.   On: 11/24/2021 14:01    Procedures Procedures    Medications Ordered in ED Medications  iohexol (OMNIPAQUE) 350 MG/ML injection 75 mL (75 mLs Intravenous Contrast Given 11/24/21 1848)    ED Course/ Medical Decision Making/ A&P                           Medical Decision Making  This patient presents to the ED with concern for left arm pain and swelling. This involves an extensive number of treatment options, and is a complaint that carries with it a high risk of complications and morbidity.  The differential diagnosis includes DVT vs muscle tear vs arterial bleed vs other  No traumatic mechanism consistent with a bone fracture. Ultrasound ordered and personally read interpreted, showing no acute thrombosis, but noting a heterogenous mass in the upper arm, which per clinical exam would be most consistent with a hematoma, less consistent with an  infection or abscess.  Co-morbidities that complicate the patient evaluation: xarelto use at high risk of bleeding  Additional history obtained from his wife at bedside  I ordered and personally interpreted labs.  The pertinent results include: Hemoglobin within normal limits.  No significant abnormalities on labs  I ordered imaging studies including CT angiogram I independently visualized and interpreted imaging which showed intramuscular hematoma, and abdominal left ulnar aneurysm I agree with the radiologist interpretation  I have reviewed the patients home medicines and have made adjustments as needed.  I advised that he hold his Xarelto for the next 48 hours. Given that he is on this for paroxysmal A-fib, I think this is reasonable with concern for bleeding.  After the interventions noted above, I reevaluated the patient and found that they have: improved  The patient  has minimal pain or discomfort.  We will refer him to orthopedics for follow-up on this hematoma, although I explained to the patient and his wife it is unlikely to be an operative issue.  His ulnar aneurysm is likely an incidental finding.  He also has atherosclerotic plaque of the left subclavian artery, but has a brisk distal radial pulse and blood flow, I do not believe he has evidence of ischemia at this time, nor does he demonstrate signs of compartment syndrome.  Nonetheless I would advise that he follow-up with orthopedics to ensure that this does not develop more gradually into a compartment syndrome.   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

## 2021-11-24 NOTE — ED Triage Notes (Signed)
Patient complains of worsening swelling, bruising on his entire left arm and oozing blood from left upper arm that started Wednesday after lifting a heavy ham. Patient denies pain, is on xarelto.

## 2021-11-24 NOTE — ED Provider Triage Note (Signed)
Emergency Medicine Provider Triage Evaluation Note  Cory Gonzalez , a 88 y.o. male  was evaluated in triage.  Pt complains of left arm pain and discoloration.  Patient states he was lifting a heavy ham on Wednesday and developed pain in his bicep muscle.  Patient then developed discoloration and edema to left upper extremity.  Patient is currently on Xarelto.  Patient was evaluated at [facility] urgent care was advised to report to the ED for further evaluation. Patient is right hand dominant  Review of Systems  Positive: Arm pain Negative: numbness  Physical Exam  BP (!) 142/82 (BP Location: Right Arm)   Pulse 68   Temp 98 F (36.7 C) (Oral)   Resp 16   SpO2 100%  Gen:   Awake, no distress   Resp:  Normal effort  MSK:   Moves extremities without difficulty  Other:  LUE radial pulse intact. TTP throughout LUE.   Medical Decision Making  Medically screening exam initiated at 11:46 AM.  Appropriate orders placed.  Cory Gonzalez was informed that the remainder of the evaluation will be completed by another provider, this initial triage assessment does not replace that evaluation, and the importance of remaining in the ED until their evaluation is complete.  Labs Discussed with Dr. Vanita Panda who recommends starting with [place]. [place] order placed.    Suzy Bouchard, [place] 11/24/21 1148

## 2021-11-24 NOTE — Discharge Instructions (Addendum)
I recommend that you withhold your Xarelto for the next 2 days to help with the bleeding inside your arm.  Then he should restart the Xarelto as per normal.  You have a hematoma which is a collection of blood inside one of the muscles in your arm.  This can be quite painful and can take several weeks to get better.  You can apply ice to your arm as needed, 10 minutes at a time, if the pain is more intense.  I recommend that you follow-up with an orthopedic doctor by calling their office on Monday to schedule an appointment.

## 2021-11-24 NOTE — ED Notes (Signed)
Pt transported to CT at this time.

## 2021-11-24 NOTE — ED Notes (Signed)
No active bleeding noted

## 2021-11-24 NOTE — ED Notes (Signed)
Patient discharged by provider. Ambulatory to ED waiting room.

## 2021-11-27 DIAGNOSIS — J432 Centrilobular emphysema: Secondary | ICD-10-CM | POA: Diagnosis not present

## 2021-11-27 DIAGNOSIS — I77810 Thoracic aortic ectasia: Secondary | ICD-10-CM | POA: Diagnosis not present

## 2021-11-27 DIAGNOSIS — M0589 Other rheumatoid arthritis with rheumatoid factor of multiple sites: Secondary | ICD-10-CM | POA: Diagnosis not present

## 2021-11-27 DIAGNOSIS — D692 Other nonthrombocytopenic purpura: Secondary | ICD-10-CM | POA: Diagnosis not present

## 2021-11-27 DIAGNOSIS — I5032 Chronic diastolic (congestive) heart failure: Secondary | ICD-10-CM | POA: Diagnosis not present

## 2021-11-27 DIAGNOSIS — I4891 Unspecified atrial fibrillation: Secondary | ICD-10-CM | POA: Diagnosis not present

## 2021-11-27 DIAGNOSIS — N182 Chronic kidney disease, stage 2 (mild): Secondary | ICD-10-CM | POA: Diagnosis not present

## 2021-11-28 DIAGNOSIS — M1990 Unspecified osteoarthritis, unspecified site: Secondary | ICD-10-CM | POA: Diagnosis not present

## 2021-11-28 DIAGNOSIS — I5032 Chronic diastolic (congestive) heart failure: Secondary | ICD-10-CM | POA: Diagnosis not present

## 2021-11-28 DIAGNOSIS — N183 Chronic kidney disease, stage 3 unspecified: Secondary | ICD-10-CM | POA: Diagnosis not present

## 2021-11-28 DIAGNOSIS — M0589 Other rheumatoid arthritis with rheumatoid factor of multiple sites: Secondary | ICD-10-CM | POA: Diagnosis not present

## 2021-11-28 DIAGNOSIS — I130 Hypertensive heart and chronic kidney disease with heart failure and stage 1 through stage 4 chronic kidney disease, or unspecified chronic kidney disease: Secondary | ICD-10-CM | POA: Diagnosis not present

## 2021-12-04 DIAGNOSIS — M25572 Pain in left ankle and joints of left foot: Secondary | ICD-10-CM | POA: Diagnosis not present

## 2021-12-04 DIAGNOSIS — I10 Essential (primary) hypertension: Secondary | ICD-10-CM | POA: Diagnosis not present

## 2021-12-04 DIAGNOSIS — M25571 Pain in right ankle and joints of right foot: Secondary | ICD-10-CM | POA: Diagnosis not present

## 2021-12-04 DIAGNOSIS — E0590 Thyrotoxicosis, unspecified without thyrotoxic crisis or storm: Secondary | ICD-10-CM | POA: Diagnosis not present

## 2021-12-04 DIAGNOSIS — I5032 Chronic diastolic (congestive) heart failure: Secondary | ICD-10-CM | POA: Diagnosis not present

## 2021-12-04 DIAGNOSIS — D649 Anemia, unspecified: Secondary | ICD-10-CM | POA: Diagnosis not present

## 2021-12-04 DIAGNOSIS — I8310 Varicose veins of unspecified lower extremity with inflammation: Secondary | ICD-10-CM | POA: Diagnosis not present

## 2021-12-04 DIAGNOSIS — I77810 Thoracic aortic ectasia: Secondary | ICD-10-CM | POA: Diagnosis not present

## 2021-12-04 DIAGNOSIS — I4891 Unspecified atrial fibrillation: Secondary | ICD-10-CM | POA: Diagnosis not present

## 2021-12-04 DIAGNOSIS — D6869 Other thrombophilia: Secondary | ICD-10-CM | POA: Diagnosis not present

## 2021-12-04 DIAGNOSIS — N182 Chronic kidney disease, stage 2 (mild): Secondary | ICD-10-CM | POA: Diagnosis not present

## 2021-12-04 DIAGNOSIS — D692 Other nonthrombocytopenic purpura: Secondary | ICD-10-CM | POA: Diagnosis not present

## 2021-12-04 DIAGNOSIS — Z79899 Other long term (current) drug therapy: Secondary | ICD-10-CM | POA: Diagnosis not present

## 2021-12-04 DIAGNOSIS — J432 Centrilobular emphysema: Secondary | ICD-10-CM | POA: Diagnosis not present

## 2022-01-01 DIAGNOSIS — I4891 Unspecified atrial fibrillation: Secondary | ICD-10-CM | POA: Diagnosis not present

## 2022-01-01 DIAGNOSIS — M25571 Pain in right ankle and joints of right foot: Secondary | ICD-10-CM | POA: Diagnosis not present

## 2022-01-01 DIAGNOSIS — H5030 Unspecified intermittent heterotropia: Secondary | ICD-10-CM | POA: Diagnosis not present

## 2022-01-01 DIAGNOSIS — J432 Centrilobular emphysema: Secondary | ICD-10-CM | POA: Diagnosis not present

## 2022-01-01 DIAGNOSIS — I10 Essential (primary) hypertension: Secondary | ICD-10-CM | POA: Diagnosis not present

## 2022-01-01 DIAGNOSIS — I5032 Chronic diastolic (congestive) heart failure: Secondary | ICD-10-CM | POA: Diagnosis not present

## 2022-01-01 DIAGNOSIS — I8310 Varicose veins of unspecified lower extremity with inflammation: Secondary | ICD-10-CM | POA: Diagnosis not present

## 2022-01-01 DIAGNOSIS — Z23 Encounter for immunization: Secondary | ICD-10-CM | POA: Diagnosis not present

## 2022-01-01 DIAGNOSIS — D649 Anemia, unspecified: Secondary | ICD-10-CM | POA: Diagnosis not present

## 2022-01-01 DIAGNOSIS — H04123 Dry eye syndrome of bilateral lacrimal glands: Secondary | ICD-10-CM | POA: Diagnosis not present

## 2022-01-01 DIAGNOSIS — H0100B Unspecified blepharitis left eye, upper and lower eyelids: Secondary | ICD-10-CM | POA: Diagnosis not present

## 2022-01-01 DIAGNOSIS — E0590 Thyrotoxicosis, unspecified without thyrotoxic crisis or storm: Secondary | ICD-10-CM | POA: Diagnosis not present

## 2022-01-01 DIAGNOSIS — D692 Other nonthrombocytopenic purpura: Secondary | ICD-10-CM | POA: Diagnosis not present

## 2022-01-01 DIAGNOSIS — H5051 Esophoria: Secondary | ICD-10-CM | POA: Diagnosis not present

## 2022-01-01 DIAGNOSIS — H0100A Unspecified blepharitis right eye, upper and lower eyelids: Secondary | ICD-10-CM | POA: Diagnosis not present

## 2022-01-01 DIAGNOSIS — I77810 Thoracic aortic ectasia: Secondary | ICD-10-CM | POA: Diagnosis not present

## 2022-01-01 DIAGNOSIS — H40053 Ocular hypertension, bilateral: Secondary | ICD-10-CM | POA: Diagnosis not present

## 2022-01-23 DIAGNOSIS — M0589 Other rheumatoid arthritis with rheumatoid factor of multiple sites: Secondary | ICD-10-CM | POA: Diagnosis not present

## 2022-02-12 DIAGNOSIS — X32XXXD Exposure to sunlight, subsequent encounter: Secondary | ICD-10-CM | POA: Diagnosis not present

## 2022-02-12 DIAGNOSIS — L820 Inflamed seborrheic keratosis: Secondary | ICD-10-CM | POA: Diagnosis not present

## 2022-02-12 DIAGNOSIS — L570 Actinic keratosis: Secondary | ICD-10-CM | POA: Diagnosis not present

## 2022-02-12 DIAGNOSIS — C44219 Basal cell carcinoma of skin of left ear and external auricular canal: Secondary | ICD-10-CM | POA: Diagnosis not present

## 2022-02-14 DIAGNOSIS — N183 Chronic kidney disease, stage 3 unspecified: Secondary | ICD-10-CM | POA: Diagnosis not present

## 2022-02-14 DIAGNOSIS — M0589 Other rheumatoid arthritis with rheumatoid factor of multiple sites: Secondary | ICD-10-CM | POA: Diagnosis not present

## 2022-02-14 DIAGNOSIS — M549 Dorsalgia, unspecified: Secondary | ICD-10-CM | POA: Diagnosis not present

## 2022-02-14 DIAGNOSIS — M4120 Other idiopathic scoliosis, site unspecified: Secondary | ICD-10-CM | POA: Diagnosis not present

## 2022-02-14 DIAGNOSIS — R7612 Nonspecific reaction to cell mediated immunity measurement of gamma interferon antigen response without active tuberculosis: Secondary | ICD-10-CM | POA: Diagnosis not present

## 2022-02-14 DIAGNOSIS — M7989 Other specified soft tissue disorders: Secondary | ICD-10-CM | POA: Diagnosis not present

## 2022-02-14 DIAGNOSIS — I10 Essential (primary) hypertension: Secondary | ICD-10-CM | POA: Diagnosis not present

## 2022-02-14 DIAGNOSIS — Z23 Encounter for immunization: Secondary | ICD-10-CM | POA: Diagnosis not present

## 2022-02-14 DIAGNOSIS — Z79899 Other long term (current) drug therapy: Secondary | ICD-10-CM | POA: Diagnosis not present

## 2022-02-14 DIAGNOSIS — M1990 Unspecified osteoarthritis, unspecified site: Secondary | ICD-10-CM | POA: Diagnosis not present

## 2022-02-15 DIAGNOSIS — R972 Elevated prostate specific antigen [PSA]: Secondary | ICD-10-CM | POA: Diagnosis not present

## 2022-02-23 DIAGNOSIS — R972 Elevated prostate specific antigen [PSA]: Secondary | ICD-10-CM | POA: Diagnosis not present

## 2022-02-23 DIAGNOSIS — R39198 Other difficulties with micturition: Secondary | ICD-10-CM | POA: Diagnosis not present

## 2022-03-13 ENCOUNTER — Emergency Department (HOSPITAL_BASED_OUTPATIENT_CLINIC_OR_DEPARTMENT_OTHER)
Admission: EM | Admit: 2022-03-13 | Discharge: 2022-03-13 | Disposition: A | Discharge status: Home / Self Care | Payer: Medicare Other | Attending: Emergency Medicine | Admitting: Emergency Medicine

## 2022-03-13 ENCOUNTER — Emergency Department (HOSPITAL_BASED_OUTPATIENT_CLINIC_OR_DEPARTMENT_OTHER): Payer: Medicare Other

## 2022-03-13 ENCOUNTER — Other Ambulatory Visit: Payer: Self-pay

## 2022-03-13 DIAGNOSIS — S80811A Abrasion, right lower leg, initial encounter: Secondary | ICD-10-CM | POA: Diagnosis not present

## 2022-03-13 DIAGNOSIS — M47812 Spondylosis without myelopathy or radiculopathy, cervical region: Secondary | ICD-10-CM | POA: Diagnosis not present

## 2022-03-13 DIAGNOSIS — I110 Hypertensive heart disease with heart failure: Secondary | ICD-10-CM | POA: Insufficient documentation

## 2022-03-13 DIAGNOSIS — W010XXA Fall on same level from slipping, tripping and stumbling without subsequent striking against object, initial encounter: Secondary | ICD-10-CM | POA: Diagnosis not present

## 2022-03-13 DIAGNOSIS — S0990XA Unspecified injury of head, initial encounter: Secondary | ICD-10-CM | POA: Diagnosis not present

## 2022-03-13 DIAGNOSIS — M4312 Spondylolisthesis, cervical region: Secondary | ICD-10-CM | POA: Diagnosis not present

## 2022-03-13 DIAGNOSIS — S0083XA Contusion of other part of head, initial encounter: Secondary | ICD-10-CM | POA: Insufficient documentation

## 2022-03-13 DIAGNOSIS — I4891 Unspecified atrial fibrillation: Secondary | ICD-10-CM | POA: Insufficient documentation

## 2022-03-13 DIAGNOSIS — I5032 Chronic diastolic (congestive) heart failure: Secondary | ICD-10-CM | POA: Insufficient documentation

## 2022-03-13 DIAGNOSIS — W19XXXA Unspecified fall, initial encounter: Secondary | ICD-10-CM

## 2022-03-13 DIAGNOSIS — Z7901 Long term (current) use of anticoagulants: Secondary | ICD-10-CM | POA: Insufficient documentation

## 2022-03-13 DIAGNOSIS — R519 Headache, unspecified: Secondary | ICD-10-CM | POA: Diagnosis present

## 2022-03-13 DIAGNOSIS — Z79899 Other long term (current) drug therapy: Secondary | ICD-10-CM | POA: Insufficient documentation

## 2022-03-13 DIAGNOSIS — T148XXA Other injury of unspecified body region, initial encounter: Secondary | ICD-10-CM

## 2022-03-13 DIAGNOSIS — C44219 Basal cell carcinoma of skin of left ear and external auricular canal: Secondary | ICD-10-CM | POA: Diagnosis not present

## 2022-03-13 DIAGNOSIS — S0993XA Unspecified injury of face, initial encounter: Secondary | ICD-10-CM | POA: Diagnosis not present

## 2022-03-13 DIAGNOSIS — S0081XA Abrasion of other part of head, initial encounter: Secondary | ICD-10-CM | POA: Diagnosis not present

## 2022-03-13 LAB — CBC
HCT: 37.6 % — ABNORMAL LOW (ref 39.0–52.0)
Hemoglobin: 12.7 g/dL — ABNORMAL LOW (ref 13.0–17.0)
MCH: 33.6 pg (ref 26.0–34.0)
MCHC: 33.8 g/dL (ref 30.0–36.0)
MCV: 99.5 fL (ref 80.0–100.0)
Platelets: 247 10*3/uL (ref 150–400)
RBC: 3.78 MIL/uL — ABNORMAL LOW (ref 4.22–5.81)
RDW: 16.2 % — ABNORMAL HIGH (ref 11.5–15.5)
WBC: 6.4 10*3/uL (ref 4.0–10.5)
nRBC: 0 % (ref 0.0–0.2)

## 2022-03-13 LAB — DIFFERENTIAL
Abs Immature Granulocytes: 0.03 10*3/uL (ref 0.00–0.07)
Basophils Absolute: 0 10*3/uL (ref 0.0–0.1)
Basophils Relative: 1 %
Eosinophils Absolute: 0.1 10*3/uL (ref 0.0–0.5)
Eosinophils Relative: 1 %
Immature Granulocytes: 1 %
Lymphocytes Relative: 15 %
Lymphs Abs: 1 10*3/uL (ref 0.7–4.0)
Monocytes Absolute: 0.8 10*3/uL (ref 0.1–1.0)
Monocytes Relative: 13 %
Neutro Abs: 4.5 10*3/uL (ref 1.7–7.7)
Neutrophils Relative %: 69 %

## 2022-03-13 LAB — COMPREHENSIVE METABOLIC PANEL
ALT: 19 U/L (ref 0–44)
AST: 31 U/L (ref 15–41)
Albumin: 3.9 g/dL (ref 3.5–5.0)
Alkaline Phosphatase: 58 U/L (ref 38–126)
Anion gap: 7 (ref 5–15)
BUN: 17 mg/dL (ref 8–23)
CO2: 24 mmol/L (ref 22–32)
Calcium: 8.6 mg/dL — ABNORMAL LOW (ref 8.9–10.3)
Chloride: 103 mmol/L (ref 98–111)
Creatinine, Ser: 0.95 mg/dL (ref 0.61–1.24)
GFR, Estimated: >60 mL/min (ref >60)
Glucose, Bld: 93 mg/dL (ref 70–99)
Potassium: 3.4 mmol/L — ABNORMAL LOW (ref 3.5–5.1)
Sodium: 134 mmol/L — ABNORMAL LOW (ref 135–145)
Total Bilirubin: 0.7 mg/dL (ref 0.3–1.2)
Total Protein: 6.9 g/dL (ref 6.5–8.1)

## 2022-03-13 LAB — PROTIME-INR
INR: 1.4 — ABNORMAL HIGH (ref 0.8–1.2)
Prothrombin Time: 17.1 seconds — ABNORMAL HIGH (ref 11.4–15.2)

## 2022-03-13 LAB — ETHANOL: Alcohol, Ethyl (B): <10 mg/dL (ref <10)

## 2022-03-13 LAB — APTT: aPTT: 36 seconds (ref 24–36)

## 2022-03-13 LAB — CBG MONITORING, ED: Glucose-Capillary: 82 mg/dL (ref 70–99)

## 2022-03-13 NOTE — ED Triage Notes (Addendum)
Pt arrives with c/o fall that happened yesterday morning. Pt c/o left sided head and eye pain. Pt has some swelling and bruising on left eye. Pt does take xarleto. Pt denies vision changes or LOC. Pt a&ox4. Pt HOH. Pt denies other injuries. Pt denies neck pain.

## 2022-03-13 NOTE — ED Provider Notes (Addendum)
[facility] Provider Note   CSN: [id number] Arrival date & time: 03/13/22  1542     History  Chief Complaint  Patient presents with   [name]    Cory Gonzalez is a 89 y.o. male.  Patient with a fall at home yesterday at about 5 in the morning.  Patient uses a cane and also has a walker.  He tripped over his cane and went down.  Has an abrasion to his right anterior shin and bruising around his left eye.  Patient takes Xarelto.  Patient sent in by his doctor for concerns for may be blood on the brain or may be injury to the eye.  Past medical history sniffer hypertension chronic diastolic heart failure atrial fibrillation patient does not use any alcohol.  Patient everyday user of a pipe for 50 years.       Home Medications Prior to Admission medications   Medication Sig Start Date End Date Taking? Authorizing Provider  amLODipine (NORVASC) 10 MG tablet Take 10 mg by mouth daily.  08/06/11   [provider]  folic acid (FOLVITE) 1 MG tablet Take 3 mg by mouth daily.     [provider]  HYDROcodone-acetaminophen (NORCO/VICODIN) 5-325 MG tablet Take 1 tablet by mouth every 6 (six) hours as needed for moderate pain. 09/12/20   Fredia Sorrow, MD  InFLIXimab (REMICADE IV) Inject into the vein every 8 (eight) weeks.    [provider]  latanoprost (XALATAN) 0.005 % ophthalmic solution Place 1 drop into both eyes at bedtime.  11/01/14   [provider]  lisinopril-hydrochlorothiazide (PRINZIDE,ZESTORETIC) 20-12.5 MG tablet Take 1 tablet by mouth daily. 11/03/15   [provider]  methotrexate (RHEUMATREX) 2.5 MG tablet Take 10 mg by mouth every Friday.  10/06/14   [provider]  Multiple Vitamin (MULITIVITAMIN WITH MINERALS) TABS Take 1 tablet by mouth daily.    [provider]  predniSONE (DELTASONE) 5 MG tablet Take 5 mg by mouth daily with breakfast.    [provider]   rivaroxaban (XARELTO) 15 MG TABS tablet Take 1 tablet (15 mg total) by mouth daily with supper. 09/17/17   Skeet Latch, MD      Allergies    Patient has no known allergies.    Review of Systems   Review of Systems  Constitutional:  Negative for chills and fever.  HENT:  Positive for facial swelling. Negative for ear pain and sore throat.   Eyes:  Negative for photophobia, pain, redness and visual disturbance.  Respiratory:  Negative for cough and shortness of breath.   Cardiovascular:  Negative for chest pain and palpitations.  Gastrointestinal:  Negative for abdominal pain and vomiting.  Genitourinary:  Negative for dysuria and hematuria.  Musculoskeletal:  Negative for arthralgias and back pain.  Skin:  Positive for wound. Negative for color change and rash.  Neurological:  Negative for seizures and syncope.  Hematological:  Bruises/bleeds easily.  All other systems reviewed and are negative.   Physical Exam Updated Vital Signs BP (!) 145/95   Pulse 88   Temp 98 F (36.7 C)   Resp 18   Ht 1.626 m (5' 4"$ )   Wt 63.5 kg   SpO2 100%   BMI 24.03 kg/m  Physical Exam Vitals and nursing note reviewed.  Constitutional:      General: He is not in acute distress.    Appearance: Normal appearance. He is well-developed.  HENT:  Head: Normocephalic.     Comments: Swelling and bruising around the left eye area.    Mouth/Throat:     Mouth: Mucous membranes are moist.  Eyes:     Extraocular Movements: Extraocular movements intact.     Conjunctiva/sclera: Conjunctivae normal.     Pupils: Pupils are equal, round, and reactive to light.     Comments: No hyphema.  Cardiovascular:     Rate and Rhythm: Normal rate and regular rhythm.     Heart sounds: No murmur heard. Pulmonary:     Effort: Pulmonary effort is normal. No respiratory distress.     Breath sounds: Normal breath sounds.  Abdominal:     Palpations: Abdomen is soft.     Tenderness: There is no abdominal  tenderness.  Musculoskeletal:        General: Signs of injury present. No swelling.     Cervical back: Normal range of motion and neck supple.     Comments: Anterior abrasion to the right shin.  Linear nature measuring about 8 cm.  No signs of secondary infection.  Skin:    General: Skin is warm and dry.     Capillary Refill: Capillary refill takes less than 2 seconds.  Neurological:     General: No focal deficit present.     Mental Status: He is alert and oriented to person, place, and time.  Psychiatric:        Mood and Affect: Mood normal.     ED Results / Procedures / Treatments   Labs (all labs ordered are listed, but only abnormal results are displayed) Labs Reviewed  PROTIME-INR - Abnormal; Notable for the following components:      Result Value   Prothrombin Time 17.1 (*)    INR 1.4 (*)    All other components within normal limits  CBC - Abnormal; Notable for the following components:   RBC 3.78 (*)    Hemoglobin 12.7 (*)    HCT 37.6 (*)    RDW 16.2 (*)    All other components within normal limits  COMPREHENSIVE METABOLIC PANEL - Abnormal; Notable for the following components:   Sodium 134 (*)    Potassium 3.4 (*)    Calcium 8.6 (*)    All other components within normal limits  APTT  DIFFERENTIAL  ETHANOL  CBG MONITORING, ED    EKG None  Radiology No results found.  Procedures Procedures    Medications Ordered in ED Medications - No data to display  ED Course/ Medical Decision Making/ A&P                             Medical Decision Making Amount and/or Complexity of Data Reviewed Labs: ordered. Radiology: ordered.   Patient with a fair amount of bruising around the left eye.  Extraocular muscles are intact.  Will get CT facial CT head.  Triage had already ordered CT cervical spine.  Patient also with an abrasion that is healing well to his right anterior shin.  No other significant injuries.  Patient is been ambulating since that fall that  occurred yesterday.  INR 1.4 CBC white count 6.4 hemoglobin 12.7.  Platelets 247.  Complete metabolic panel sodium [id number] potassium 3.4 total bili 0.7 LFTs normal renal function GFR greater than 60 anion gap 7 alcohol less than 10.  CT head face and neck are pending.  CTs negative head neck and face.  Old nasal bone fracture nothing acute  no orbital fracture.  Patient stable for discharge home.   Final Clinical Impression(s) / ED Diagnoses Final diagnoses:  Fall, initial encounter  Injury of head, initial encounter  Abrasion  Contusion of face, initial encounter    Rx / DC Orders ED Discharge Orders     None         Fredia Sorrow, MD 03/13/22 1643    Fredia Sorrow, MD 03/13/22 1731

## 2022-03-13 NOTE — Discharge Instructions (Signed)
CT head neck and face without any acute bony abnormalities.  Labs without any significant abnormalities.  Cleared for discharge home.

## 2022-03-13 NOTE — ED Notes (Signed)
Pt refusing IV start, states "just get the blood, my pain is gone now.".

## 2022-03-20 DIAGNOSIS — M0589 Other rheumatoid arthritis with rheumatoid factor of multiple sites: Secondary | ICD-10-CM | POA: Diagnosis not present

## 2022-03-20 DIAGNOSIS — Z1159 Encounter for screening for other viral diseases: Secondary | ICD-10-CM | POA: Diagnosis not present

## 2022-03-27 DIAGNOSIS — C44219 Basal cell carcinoma of skin of left ear and external auricular canal: Secondary | ICD-10-CM | POA: Diagnosis not present

## 2022-03-28 ENCOUNTER — Other Ambulatory Visit: Payer: Self-pay | Admitting: Otolaryngology

## 2022-03-28 ENCOUNTER — Other Ambulatory Visit: Payer: Self-pay

## 2022-03-28 ENCOUNTER — Encounter (HOSPITAL_COMMUNITY): Payer: Self-pay | Admitting: Otolaryngology

## 2022-03-28 DIAGNOSIS — C44219 Basal cell carcinoma of skin of left ear and external auricular canal: Secondary | ICD-10-CM | POA: Diagnosis not present

## 2022-03-28 DIAGNOSIS — Z7901 Long term (current) use of anticoagulants: Secondary | ICD-10-CM | POA: Diagnosis not present

## 2022-03-28 DIAGNOSIS — I480 Paroxysmal atrial fibrillation: Secondary | ICD-10-CM | POA: Diagnosis not present

## 2022-03-28 DIAGNOSIS — Z974 Presence of external hearing-aid: Secondary | ICD-10-CM | POA: Diagnosis not present

## 2022-03-28 DIAGNOSIS — H61112 Acquired deformity of pinna, left ear: Secondary | ICD-10-CM | POA: Diagnosis not present

## 2022-03-28 NOTE — Progress Notes (Addendum)
PCP - Dr. Merrilee Seashore Cardiologist - Denies  PPM/ICD - Denies  Chest x-ray - 07/27/2019 EKG -  Will need EKG DOS Stress Test - 05/13/2015 ECHO - 07/30/2019 Cardiac Cath - Denies  Sleep study/sleep apnea/ CPAP: Denies  Non-diabetic   Blood Thinner Instructions: Stop Xarelto per surgeon instructions Aspirin Instructions: Denies  ERAS Protcol - Yes, clear liquids 3 hours prior to surgery  COVID TEST- Denies  Anesthesia review: yes, Heart Murmur, HTN, Diastolic heart failure, A-Fib  Patient verbally denies any shortness of breath, fever, cough and chest pain during phone call   -------------  SDW INSTRUCTIONS given:  Your procedure is scheduled on Thursday March 29, 2022.  Report to [facility] Main Entrance "A" at 9:30 A.M., and check in at the Admitting office.  Call this number if you have problems the morning of surgery:  [phone]   Remember:  Do not eat after midnight the night before your surgery  You may drink clear liquids until 9:00 the morning of your surgery.   Clear liquids allowed are: Water, Non-Citrus Juices (without pulp), Carbonated Beverages, Clear Tea, Black Coffee Only, and Gatorade   Take these medicines the morning of surgery with A SIP OF WATER  Norvasc, Prednisone   As needed: Tylenol   Follow your surgeon's instructions on when to stop Xarelto.  If no instructions were given by your surgeon then you will need to call the office to get those instructions.    As of today, STOP taking any Aspirin (unless otherwise instructed by your surgeon) Aleve, Naproxen, Ibuprofen, Motrin, Advil, Goody's, BC's, all herbal medications, fish oil, and all vitamins.                      Do not wear lotions, powders, colognes, or deodorant. Men may shave face and neck.            Do not bring valuables to the hospital.             [facility] is not responsible for any belongings or valuables.  Do NOT Smoke (Tobacco/Vaping) 24 hours prior to your  procedure If you use a CPAP at night, you may bring all equipment for your overnight stay.   Contacts, glasses, dentures or bridgework may not be worn into surgery.      For patients admitted to the hospital, discharge time will be determined by your treatment team.   Patients discharged the day of surgery will not be allowed to drive home, and someone needs to stay with them for 24 hours.    Special instructions:   [place]- Preparing For Surgery  Before surgery, you can play an important role. Because skin is not sterile, your skin needs to be as free of germs as possible. You can reduce the number of germs on your skin by washing with Dial Soap before surgery.     Oral Hygiene is also important to reduce your risk of infection.  Remember - BRUSH YOUR TEETH THE MORNING OF SURGERY WITH YOUR REGULAR TOOTHPASTE   Please follow these instructions carefully.   Shower the NIGHT BEFORE SURGERY and the MORNING OF SURGERY with DIAL Soap.   Pat yourself dry with a CLEAN TOWEL.  Wear CLEAN PAJAMAS to bed the night before surgery  Place CLEAN SHEETS on your bed the night of your first shower and DO NOT SLEEP WITH PETS.   Day of Surgery: Please shower morning of surgery  Wear Clean/Comfortable clothing the morning  of surgery Do not apply any deodorants/lotions.   Remember to brush your teeth WITH YOUR REGULAR TOOTHPASTE.   Questions were answered. Patient verbalized understanding of instructions.

## 2022-03-28 NOTE — Progress Notes (Signed)
Dr. Lissa Hoard advised of patient add on to surgery schedule.  Patient with history of heart murmur, HTN, Diastolic heart failure and A-fib.  Patient does not see a cardiologist. Will need and EKG DOS.  States Anesthesiology will assess the patient in the morning in Short Stay, unable to determine if he is a candidate for surgery at this time.

## 2022-03-29 ENCOUNTER — Ambulatory Visit (HOSPITAL_BASED_OUTPATIENT_CLINIC_OR_DEPARTMENT_OTHER): Payer: Medicare Other | Admitting: Anesthesiology

## 2022-03-29 ENCOUNTER — Other Ambulatory Visit: Payer: Self-pay

## 2022-03-29 ENCOUNTER — Encounter (HOSPITAL_COMMUNITY)
Admission: RE | Disposition: A | Discharge status: Home / Self Care | Payer: Self-pay | Source: Home / Self Care | Attending: Otolaryngology

## 2022-03-29 ENCOUNTER — Ambulatory Visit (HOSPITAL_COMMUNITY)
Admission: RE | Admit: 2022-03-29 | Discharge: 2022-03-29 | Disposition: A | Discharge status: Home / Self Care | Payer: Medicare Other | Attending: Otolaryngology | Admitting: Otolaryngology

## 2022-03-29 ENCOUNTER — Ambulatory Visit (HOSPITAL_COMMUNITY): Payer: Medicare Other | Admitting: Anesthesiology

## 2022-03-29 ENCOUNTER — Encounter (HOSPITAL_COMMUNITY): Payer: Self-pay | Admitting: Otolaryngology

## 2022-03-29 DIAGNOSIS — I4891 Unspecified atrial fibrillation: Secondary | ICD-10-CM | POA: Diagnosis not present

## 2022-03-29 DIAGNOSIS — H61112 Acquired deformity of pinna, left ear: Secondary | ICD-10-CM | POA: Diagnosis not present

## 2022-03-29 DIAGNOSIS — F17290 Nicotine dependence, other tobacco product, uncomplicated: Secondary | ICD-10-CM | POA: Insufficient documentation

## 2022-03-29 DIAGNOSIS — I480 Paroxysmal atrial fibrillation: Secondary | ICD-10-CM | POA: Insufficient documentation

## 2022-03-29 DIAGNOSIS — I5032 Chronic diastolic (congestive) heart failure: Secondary | ICD-10-CM | POA: Diagnosis not present

## 2022-03-29 DIAGNOSIS — I10 Essential (primary) hypertension: Secondary | ICD-10-CM | POA: Diagnosis not present

## 2022-03-29 DIAGNOSIS — I110 Hypertensive heart disease with heart failure: Secondary | ICD-10-CM | POA: Insufficient documentation

## 2022-03-29 DIAGNOSIS — Z85828 Personal history of other malignant neoplasm of skin: Secondary | ICD-10-CM | POA: Diagnosis not present

## 2022-03-29 DIAGNOSIS — F17210 Nicotine dependence, cigarettes, uncomplicated: Secondary | ICD-10-CM | POA: Diagnosis not present

## 2022-03-29 DIAGNOSIS — M069 Rheumatoid arthritis, unspecified: Secondary | ICD-10-CM | POA: Insufficient documentation

## 2022-03-29 DIAGNOSIS — Z7901 Long term (current) use of anticoagulants: Secondary | ICD-10-CM | POA: Diagnosis not present

## 2022-03-29 DIAGNOSIS — F17200 Nicotine dependence, unspecified, uncomplicated: Secondary | ICD-10-CM | POA: Diagnosis not present

## 2022-03-29 DIAGNOSIS — M952 Other acquired deformity of head: Secondary | ICD-10-CM | POA: Diagnosis not present

## 2022-03-29 DIAGNOSIS — C44219 Basal cell carcinoma of skin of left ear and external auricular canal: Secondary | ICD-10-CM

## 2022-03-29 DIAGNOSIS — R011 Cardiac murmur, unspecified: Secondary | ICD-10-CM | POA: Insufficient documentation

## 2022-03-29 HISTORY — PX: NASAL FLAP ROTATION: SHX5366

## 2022-03-29 HISTORY — DX: Cardiac arrhythmia, unspecified: I49.9

## 2022-03-29 HISTORY — PX: EXCISION MASS HEAD: SHX6702

## 2022-03-29 HISTORY — PX: SKIN FULL THICKNESS GRAFT: SHX442

## 2022-03-29 SURGERY — EXCISION, MASS, HEAD
Anesthesia: Monitor Anesthesia Care | Laterality: Left

## 2022-03-29 MED ORDER — HYDROCODONE-ACETAMINOPHEN 5-325 MG PO TABS: 1.0000 | ORAL_TABLET | Freq: Four times a day (QID) | ORAL | 0 refills | Status: DC | PRN

## 2022-03-29 MED ORDER — PHENYLEPHRINE 80 MCG/ML (10ML) SYRINGE FOR IV PUSH (FOR BLOOD PRESSURE SUPPORT)
PREFILLED_SYRINGE | INTRAVENOUS | Status: DC | PRN
  Administered 2022-03-29: 80 ug via INTRAVENOUS

## 2022-03-29 MED ORDER — CHLORHEXIDINE GLUCONATE 0.12 % MT SOLN
15.0000 mL | Freq: Once | OROMUCOSAL | Status: AC
  Administered 2022-03-29: 15 mL via OROMUCOSAL
  Filled 2022-03-29: qty 15

## 2022-03-29 MED ORDER — FENTANYL CITRATE (PF) 100 MCG/2ML IJ SOLN: 25.0000 ug | INTRAMUSCULAR | Status: DC | PRN

## 2022-03-29 MED ORDER — FENTANYL CITRATE (PF) 250 MCG/5ML IJ SOLN
INTRAMUSCULAR | Status: AC
  Filled 2022-03-29: qty 5

## 2022-03-29 MED ORDER — OXYCODONE HCL 5 MG PO TABS
5.0000 mg | ORAL_TABLET | Freq: Once | ORAL | Status: AC | PRN
  Administered 2022-03-29: 5 mg via ORAL

## 2022-03-29 MED ORDER — PROPOFOL 10 MG/ML IV BOLUS
INTRAVENOUS | Status: AC
  Filled 2022-03-29: qty 20

## 2022-03-29 MED ORDER — BUPIVACAINE HCL (PF) 0.25 % IJ SOLN
INTRAMUSCULAR | Status: AC
  Filled 2022-03-29: qty 20

## 2022-03-29 MED ORDER — ONDANSETRON HCL 4 MG/2ML IJ SOLN: 4.0000 mg | Freq: Once | INTRAMUSCULAR | Status: DC | PRN

## 2022-03-29 MED ORDER — PROPOFOL 500 MG/50ML IV EMUL
INTRAVENOUS | Status: DC | PRN
  Administered 2022-03-29: 30 ug/kg/min via INTRAVENOUS

## 2022-03-29 MED ORDER — AMISULPRIDE (ANTIEMETIC) 5 MG/2ML IV SOLN: 10.0000 mg | Freq: Once | INTRAVENOUS | Status: DC | PRN

## 2022-03-29 MED ORDER — SODIUM BICARBONATE 8.4 % IV SOLN
INTRAVENOUS | Status: AC
  Filled 2022-03-29: qty 50

## 2022-03-29 MED ORDER — LIDOCAINE 2% (20 MG/ML) 5 ML SYRINGE
INTRAMUSCULAR | Status: AC
  Filled 2022-03-29: qty 5

## 2022-03-29 MED ORDER — LACTATED RINGERS IV SOLN: INTRAVENOUS | Status: DC

## 2022-03-29 MED ORDER — HYDROCODONE-ACETAMINOPHEN 5-325 MG PO TABS: 1.0000 | ORAL_TABLET | Freq: Four times a day (QID) | ORAL | 0 refills | Status: AC | PRN

## 2022-03-29 MED ORDER — OXYCODONE HCL 5 MG/5ML PO SOLN: 5.0000 mg | Freq: Once | ORAL | Status: AC | PRN

## 2022-03-29 MED ORDER — 0.9 % SODIUM CHLORIDE (POUR BTL) OPTIME
TOPICAL | Status: DC | PRN
  Administered 2022-03-29: 1000 mL

## 2022-03-29 MED ORDER — ORAL CARE MOUTH RINSE: 15.0000 mL | Freq: Once | OROMUCOSAL | Status: AC

## 2022-03-29 MED ORDER — ONDANSETRON HCL 4 MG/2ML IJ SOLN
INTRAMUSCULAR | Status: DC | PRN
  Administered 2022-03-29: 4 mg via INTRAVENOUS

## 2022-03-29 MED ORDER — CEFAZOLIN SODIUM-DEXTROSE 2-4 GM/100ML-% IV SOLN
2.0000 g | INTRAVENOUS | Status: AC
  Administered 2022-03-29: 2 g via INTRAVENOUS
  Filled 2022-03-29: qty 100

## 2022-03-29 MED ORDER — OXYCODONE HCL 5 MG PO TABS
ORAL_TABLET | ORAL | Status: AC
  Filled 2022-03-29: qty 1

## 2022-03-29 MED ORDER — ROCURONIUM BROMIDE 10 MG/ML (PF) SYRINGE
PREFILLED_SYRINGE | INTRAVENOUS | Status: AC
  Filled 2022-03-29: qty 10

## 2022-03-29 MED ORDER — LIDOCAINE-EPINEPHRINE 1 %-1:100000 IJ SOLN
INTRAMUSCULAR | Status: DC | PRN
  Administered 2022-03-29: 16 mL

## 2022-03-29 MED ORDER — ACETAMINOPHEN 500 MG PO TABS
1000.0000 mg | ORAL_TABLET | Freq: Once | ORAL | Status: AC
  Administered 2022-03-29: 1000 mg via ORAL
  Filled 2022-03-29: qty 2

## 2022-03-29 MED ORDER — BSS IO SOLN
INTRAOCULAR | Status: AC
  Filled 2022-03-29: qty 15

## 2022-03-29 MED ORDER — LIDOCAINE-EPINEPHRINE 1 %-1:100000 IJ SOLN
INTRAMUSCULAR | Status: AC
  Filled 2022-03-29: qty 2

## 2022-03-29 MED ORDER — BACITRACIN ZINC 500 UNIT/GM EX OINT
TOPICAL_OINTMENT | CUTANEOUS | Status: AC
  Filled 2022-03-29: qty 28.35

## 2022-03-29 SURGICAL SUPPLY — 62 items
AGENT HMST KT MTR STRL THRMB (HEMOSTASIS)
APL SRG 3 HI ABS STRL LF PLS (MISCELLANEOUS) ×1
APPLICATOR DR MATTHEWS STRL (MISCELLANEOUS) IMPLANT
BAG COUNTER SPONGE SURGICOUNT (BAG) ×1 IMPLANT
BAG SPNG CNTER NS LX DISP (BAG) ×1
BETADINE 5% OPHTHALMIC (OPHTHALMIC) ×2 IMPLANT
BLADE SURG 15 STRL LF DISP TIS (BLADE) ×1 IMPLANT
BLADE SURG 15 STRL SS (BLADE) ×1
CANISTER SUCT 3000ML PPV (MISCELLANEOUS) ×1 IMPLANT
CLEANER TIP ELECTROSURG 2X2 (MISCELLANEOUS) ×1 IMPLANT
CNTNR URN SCR LID CUP LEK RST (MISCELLANEOUS) ×1 IMPLANT
CONT SPEC 4OZ STRL OR WHT (MISCELLANEOUS) ×1
CORD BIPOLAR FORCEPS 12FT (ELECTRODE) IMPLANT
COVER SURGICAL LIGHT HANDLE (MISCELLANEOUS) ×1 IMPLANT
DRAPE HALF SHEET 40X57 (DRAPES) ×1 IMPLANT
DRESSING NASAL POPE 10X1.5X2.5 (GAUZE/BANDAGES/DRESSINGS) ×1 IMPLANT
DRSG GLASSCOCK MASTOID ADT (GAUZE/BANDAGES/DRESSINGS) IMPLANT
DRSG NASAL POPE 10X1.5X2.5 (GAUZE/BANDAGES/DRESSINGS) ×1
DRSG TEGADERM 2-3/8X2-3/4 SM (GAUZE/BANDAGES/DRESSINGS) ×1 IMPLANT
DRSG TELFA 3X8 NADH STRL (GAUZE/BANDAGES/DRESSINGS) ×1 IMPLANT
ELECT COATED BLADE 2.86 ST (ELECTRODE) ×1 IMPLANT
ELECT REM PT RETURN 9FT ADLT (ELECTROSURGICAL) ×1
ELECTRODE REM PT RTRN 9FT ADLT (ELECTROSURGICAL) ×1 IMPLANT
FORCEPS BIPOLAR SPETZLER 8 1.0 (NEUROSURGERY SUPPLIES) IMPLANT
GAUZE 4X4 16PLY ~~LOC~~+RFID DBL (SPONGE) ×1 IMPLANT
GAUZE XEROFORM 1X8 LF (GAUZE/BANDAGES/DRESSINGS) ×1 IMPLANT
GLOVE BIO SURGEON STRL SZ7.5 (GLOVE) ×1 IMPLANT
GLOVE BIOGEL PI IND STRL 8 (GLOVE) ×1 IMPLANT
GOWN STRL REUS W/ TWL LRG LVL3 (GOWN DISPOSABLE) ×1 IMPLANT
GOWN STRL REUS W/ TWL XL LVL3 (GOWN DISPOSABLE) ×1 IMPLANT
GOWN STRL REUS W/TWL LRG LVL3 (GOWN DISPOSABLE) ×1
GOWN STRL REUS W/TWL XL LVL3 (GOWN DISPOSABLE) ×1
KIT BASIN OR (CUSTOM PROCEDURE TRAY) ×1 IMPLANT
KIT TURNOVER KIT B (KITS) ×1 IMPLANT
MARKER SKIN DUAL TIP RULER LAB (MISCELLANEOUS) ×1 IMPLANT
NDL HYPO 30X.5 LL (NEEDLE) ×1 IMPLANT
NDL PRECISIONGLIDE 27X1.5 (NEEDLE) ×1 IMPLANT
NEEDLE HYPO 30X.5 LL (NEEDLE) ×1 IMPLANT
NEEDLE PRECISIONGLIDE 27X1.5 (NEEDLE) ×1 IMPLANT
NS IRRIG 1000ML POUR BTL (IV SOLUTION) ×1 IMPLANT
OPHTHALMIC BETADINE 5% (OPHTHALMIC) ×2
PACK AMBRUS EAR (MISCELLANEOUS) IMPLANT
PAD ARMBOARD 7.5X6 YLW CONV (MISCELLANEOUS) ×2 IMPLANT
PENCIL SMOKE EVACUATOR (MISCELLANEOUS) ×1 IMPLANT
POSITIONER HEAD DONUT 9IN (MISCELLANEOUS) ×1 IMPLANT
SPONGE T-LAP 18X18 ~~LOC~~+RFID (SPONGE) ×1 IMPLANT
STAPLER VISISTAT 35W (STAPLE) ×1 IMPLANT
SURGIFLO W/THROMBIN 8M KIT (HEMOSTASIS) IMPLANT
SUT CHROMIC 5 0 P 3 (SUTURE) IMPLANT
SUT ETHILON 5 0 PS 2 18 (SUTURE) IMPLANT
SUT ETHILON 6 0 9-3 1X18 BLK (SUTURE) IMPLANT
SUT ETHILON 6 0 P 1 (SUTURE) IMPLANT
SUT MON AB 5-0 P3 18 (SUTURE) IMPLANT
SUT VIC AB 3-0 X1 27 (SUTURE) IMPLANT
SUT VIC AB 4-0 PS2 18 (SUTURE) IMPLANT
SUT VIC AB 4-0 PS2 27 (SUTURE) IMPLANT
SUT VICRYL 4-0 PS2 18IN ABS (SUTURE) IMPLANT
SYR CONTROL 10ML LL (SYRINGE) ×1 IMPLANT
TOWEL GREEN STERILE FF (TOWEL DISPOSABLE) ×1 IMPLANT
TRAY ENT MC OR (CUSTOM PROCEDURE TRAY) ×1 IMPLANT
TUBE CONNECTING 12X1/4 (SUCTIONS) ×1 IMPLANT
WATER STERILE IRR 1000ML POUR (IV SOLUTION) ×1 IMPLANT

## 2022-03-29 NOTE — Op Note (Signed)
FACIAL PLASTIC SURGERY OPERATIVE NOTE  Cory Gonzalez Date/Time of Admission: 03/29/2022  9:28 AM  CSN: [id number] Attending Provider: Jenetta Downer, MD Room/Bed: MCPO/NONE DOB: 1929-07-06 Age: 89 y.o.   Pre-Op Diagnosis: Basal cell carcinoma of  left ear; Acquire ear deformity; Moh's defect  Post-Op Diagnosis: Basal cell carcinoma of left ear; Acquire ear deformity; Moh's defect  Procedure:  Adjacent tissue transfer left cheek to ear inferiorly based transposition flap 3x1.5cm (CPT 14060) Full thickness skin graft 5x2cm from left neck to ear (CPT 15240) Excision wound ear, in preparation for flap (CPT 15004)  Anesthesia: General  Surgeon(s): Pamala Hurry, MD  Staff: Circulator: Westley Gambles, RN Scrub Person: Chanetta Marshall  Implants: * No implants in log *  Specimens: * No specimens in log *  Complications: none  EBL: 10 ML  IVF: Per anesthesia ML  Condition: stable  Operative Findings:  Large mohs defect ~3x3cm of subtotal conchal bowl extending to antritragus and involving full thickness skin up to cheek/tragal junction with exposed parotid tissue, extending to inferior 60% external auditory canal.   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 Harl Bowie March 27, 2022.  Has been referred to me for definitive ear reconstruction and presents today for surgery.  Informed consent obtained. Risks discussed including pain, bleeding, infection, scarring, numbness, poor cosmesis, flap loss/skin loss, ear canal stenosis, difficulty with hearing aid use, need for further surgery, risks of anesthesia. Despite these risks the patient requested to proceed with surgery.   Description  of Operation:  Patient was identified in the preoperative area and consent confirmed in the chart.  He was brought to the operating room by the anesthetist and a preoperative huddle was performed confirming patient identity and procedure to be performed.  Once all were in agreement we proceeded with surgery.  MAC anesthesia was induced.  Patient's head was turned to the right exposing the left ear.  The dressing was taken down demonstrating the findings as noted above.  The wound was anesthetized with 1% lidocaine 1 100,000 epinephrine.  The patient was prepped and draped in standard fashion for a procedure of this kind.  Final pause was performed and we proceeded.  We began with surgical debridement of the wound defect in preparation for flap and grafting.  15 blade scalpel was used to sharply debride exposed cartilage without any overlying viable perichondrium.  This was discarded in preparation for inset of the skin graft. Bleeding was controlled with bipolar cautery.  Next a 3 x 1.5 cm inferiorly based preauricular cheek transposition flap was marked out with a marking pen.  The cheek was anesthetized with lidocaine and epinephrine.  The flap was incised with a 15 blade in a subcutaneous plane.  The flap was then transposed into the Mohs defect of the ear and inset this along the anterior aspect of the defect along the external auditory canal with interrupted 5-0 Chromic Gut sutures.  Next the donor site was dissected in a subcutaneous plane sharply approximately 2 cm to allow for tension-free closure in the preauricular crease.  Bleeding was controlled with bipolar cautery. The donor site was closed with interrupted buried 4-0 Vicryl sutures and running 6-0 nylon suture.  Given the residual defect of the ear a full-thickness skin  graft 5 x 2 cm was marked out in the left supraclavicular fossa.  The area was anesthetized with lidocaine epinephrine.  After adequate time to take effect 15 blade was used to  harvest the full-thickness skin graft in the subcutaneous plane.  Skin graft was removed and set aside.  Hemostasis was achieved with bipolar cautery and the donor site was closed with buried interrupted 4-0 Vicryl sutures and running locking 5-0 nylon sutures.  Next the full-thickness skin graft was defatted to the dermal layer and inset into the remaining ear defect over vascularized tissue with a combination of interrupted and running 5-0 chromic and 5-0 plain gut sutures.  Complete circumferential reconstruction of the ear canal was achieved.  Ambrose ear pack was then placed into the external auditory canal and instilled with saline dilating the opening.  Next a Xeroform bolster was fashioned and secured to the ear with interrupted 4-0 silk bolster stitches.  The wounds was dressed with bacitracin, Telfa and brown paper tape.  A Glasscock dressing was placed on the patient's ear and the patient was turned back to the anesthetist to awaken and brought to the recovery room in stable condition.  The patient will follow up in one week for suture and bolster removal.   Pamala Hurry, MD [facility] ENT  03/29/2022

## 2022-03-29 NOTE — H&P (Signed)
Cory Gonzalez is an 89 y.o. male.    Chief Complaint:  Mohs defect left ear for basal cell carcinoma  HPI: Patient presents today for planned elective procedure.  He/she denies any interval change in history since office visit on 03/28/22.  Past Medical History:  Diagnosis Date   Chronic diastolic heart failure (HCC)    Dysrhythmia    A-fib   FUO (fever of unknown origin) 07/27/2019   Heart murmur    since childhood, never has had any problems   Hypertension    Paroxysmal atrial fibrillation (HCC)    Pneumonia    PONV (postoperative nausea and vomiting)    a little nausea   Rheumatoid arthritis ([place])    Tick bite 07/27/2019    Past Surgical History:  Procedure Laterality Date   CATARACT EXTRACTION Bilateral    COLONOSCOPY     ORIF ELBOW FRACTURE Right 09/18/2017   Procedure: OPEN REDUCTION INTERNAL FIXATION (ORIF) RIGHT ELBOW/OLECRANON FRACTURE;  Surgeon: [name], MD;  Location: [facility];  Service: Orthopedics;  Laterality: Right;   PROSTATE SURGERY      Family History  Problem Relation Age of Onset   COPD Sister    Dementia Sister    Aneurysm Brother     Social History:  reports that he has been smoking pipe. He has quit using smokeless tobacco. He reports that he does not drink alcohol and does not use drugs.  Allergies: No Known Allergies  Medications Prior to Admission  Medication Sig Dispense Refill   acetaminophen (TYLENOL) 650 MG CR tablet Take 1,300 mg by mouth every 8 (eight) hours as needed for pain.     amLODipine (NORVASC) 10 MG tablet Take 10 mg by mouth daily.      folic acid (FOLVITE) 1 MG tablet Take 3 mg by mouth daily.      InFLIXimab (REMICADE IV) Inject into the vein every 8 (eight) weeks.     latanoprost (XALATAN) 0.005 % ophthalmic solution Place 1 drop into both eyes at bedtime.   10   lisinopril-hydrochlorothiazide (PRINZIDE,ZESTORETIC) 20-12.5 MG tablet Take 1 tablet by mouth daily.  0   methotrexate (RHEUMATREX) 2.5 MG tablet Take 10 mg by  mouth every Friday.   1   Multiple Vitamin (MULITIVITAMIN WITH MINERALS) TABS Take 1 tablet by mouth daily. Centrum Silver     predniSONE (DELTASONE) 5 MG tablet Take 5 mg by mouth daily with breakfast.     rivaroxaban (XARELTO) 15 MG TABS tablet Take 1 tablet (15 mg total) by mouth daily with supper. 30 tablet 5    No results found for this or any previous visit (from the past 48 hour(s)). No results found.  ROS: negative other than stated in HPI  Blood pressure (!) 154/86, pulse (!) 59, temperature 97.7 F (36.5 C), temperature source Oral, resp. rate 16, height '5\' 5"'$  (1.651 m), weight 63.5 kg, SpO2 99 %.  PHYSICAL EXAM: General: Resting comfortably in NAD  Lungs: Non-labored respiratinos  Studies Reviewed: Surgical path - basal cell carcinoma   Assessment/Plan #Mohs defect left ear # Acquired deformity of ear # Basal cell carcinoma left ear # Hearing aid dependence # Chronic anticoagulation for atrial fibrillation # Chronic tobacco abuse, pipe smoking  Proceed with mohs reconstruction of left ear under MAC. Surgical options discussed in detail including adjacent tissue transfer, full thickness skin graft from ear or neck, integra. Patient consented to all options. Final decision in the OR After completion of exam under anesthesia.  Informed consent  obtained. Risks discussed including pain, bleeding, infection, scarring, numbness, poor cosmesis, flap loss/skin loss, ear canal stenosis, difficulty with hearing aid use, need for further surgery, risks of anesthesia. Despite these risks the patient requested to proceed with surgery.   Electronically signed by:  Jenetta Downer, MD  Staff Physician [facility]ial Plastic & Reconstructive Surgery Otolaryngology - Head and Neck Surgery [place]  03/29/2022, 11:49 AM

## 2022-03-29 NOTE — Anesthesia Preprocedure Evaluation (Addendum)
Anesthesia Evaluation  Patient identified by MRN, date of birth, ID band Patient awake    Reviewed: Allergy & Precautions, NPO status , Patient's Chart, lab work & pertinent test results  History of Anesthesia Complications Negative for: history of anesthetic complications  Airway Mallampati: III  TM Distance: >3 FB Neck ROM: Full    Dental  (+) Edentulous Upper, Edentulous Lower   Pulmonary Current Smoker   Pulmonary exam normal breath sounds clear to auscultation       Cardiovascular hypertension (154/86 preop), Pt. on medications Normal cardiovascular exam+ dysrhythmias (xarelto) Atrial Fibrillation + Valvular Problems/Murmurs (mild AS) AS  Rhythm:Regular Rate:Normal  Echo 2021  1. Left ventricular ejection fraction, by estimation, is 70%. The left  ventricle has hyperdynamic function. The left ventricle has no regional  wall motion abnormalities. There is moderate asymmetric left ventricular  hypertrophy of the septal segment.  Left ventricular diastolic parameters are consistent with Grade I  diastolic dysfunction (impaired relaxation).   2. Right ventricular systolic function is normal. The right ventricular  size is normal.   3. The mitral valve is degenerative. Trivial mitral valve regurgitation.  No evidence of mitral stenosis.   4. The aortic valve is abnormal. Aortic valve regurgitation is not  visualized. Mild aortic valve stenosis. Aortic valve mean gradient  measures 13.0 mmHg. This may be underestimated due to an underfilled left  ventricle.    Does not follow with cardiology   Neuro/Psych negative neurological ROS  negative psych ROS   GI/Hepatic negative GI ROS, Neg liver ROS,,,  Endo/Other  negative endocrine ROS    Renal/GU negative Renal ROS  negative genitourinary   Musculoskeletal  (+) Arthritis , Rheumatoid disorders,  MTX, prednisone '5mg'$  daily, remicade   Abdominal   Peds   Hematology negative hematology ROS (+)   Anesthesia Other Findings Extremely HOH  Reproductive/Obstetrics negative OB ROS                             Anesthesia Physical Anesthesia Plan  ASA: 3  Anesthesia Plan: General   Post-op Pain Management: Tylenol PO (pre-op)*   Induction:   PONV Risk Score and Plan: Treatment may vary due to age or medical condition, Ondansetron, Propofol infusion and TIVA  Airway Management Planned: Natural Airway and Simple Face Mask  Additional Equipment: None  Intra-op Plan:   Post-operative Plan:   Informed Consent: I have reviewed the patients History and Physical, chart, labs and discussed the procedure including the risks, benefits and alternatives for the proposed anesthesia with the patient or authorized representative who has indicated his/her understanding and acceptance.     Dental advisory given  Plan Discussed with: CRNA  Anesthesia Plan Comments: (D/w pt he wishes to be full code for procedure Surgeon requesting MAC )        Anesthesia Quick Evaluation

## 2022-03-29 NOTE — Transfer of Care (Signed)
Immediate Anesthesia Transfer of Care Note  Patient: Samvel Sturdy  Procedure(s) Performed: EXCISION OF EAR MOH'S DEFECT (Left) ADJACENT TISSUE TRANSFER;  SKIN GRAFT (Left) POSSIBLE FLAP ROTATION; POSSIBLE EAR CARTILAGE GRAFT (Left)  Patient Location: PACU  Anesthesia Type:MAC  Level of Consciousness: drowsy and patient cooperative  Airway & Oxygen Therapy: Patient Spontanous Breathing and Patient connected to nasal cannula oxygen  Post-op Assessment: Report given to RN and Post -op Vital signs reviewed and stable  Post vital signs: Reviewed and stable  Last Vitals:  Vitals Value Taken Time  BP 132/94 03/29/22 1619  Temp    Pulse 67 03/29/22 1625  Resp 16 03/29/22 1625  SpO2 98 % 03/29/22 1625  Vitals shown include unvalidated device data.  Last Pain:  Vitals:   03/29/22 1046  TempSrc:   PainSc: 0-No pain         Complications: No notable events documented.

## 2022-03-29 NOTE — Discharge Instructions (Signed)
Post-operative Patient Instructions Cory Gonzalez. [name] MD  Surgery What to expect: A bandage will be placed on your surgical sites. You can leave the bandages in place until you return to the clinic. You may be scheduled for a series of wound care appointments over the next month.  Recovery/Restrictions: -No strenuous activity for at least the first week after your procedure -Bruising and swelling are expected and will take weeks to go away (consider using ice packs) -Please contact our office immediately if you experience any signs/symptoms of infection (redness, pain, or fever of 100.19F or greater)  Wound Wound care: The goal is to keep your wounds clean and moist to prevent scabs or crusts. You will keep your current post-operative dressing in place undisturbed until after your first post-operative clinic visit. The following instructions apply after your first post-operative visit.  1. Clean wound wounds with soap and water using a cue tip if any crusts are present  2. Next, apply a thin layer of Aquaphor ointment 3. Apply Telfa and cover with brown tape  4. Please apply a thin layer of antibiotic ointment or Aquaphor ointment to your ear incision every day  Care Healing Period: For the best healing, please protect the area from the sun   You may be asked to begin massaging the scars several weeks after surgery. Scars can be massaged in horizontal, vertical, and circular motions.   If you need to call after clinic hours for a concern, call [phone] and ask for the "physician on call for ENT."  [address] [place], [place] [address] Phone: [phone]

## 2022-03-30 ENCOUNTER — Encounter (HOSPITAL_COMMUNITY): Payer: Self-pay | Admitting: Otolaryngology

## 2022-03-30 NOTE — Anesthesia Postprocedure Evaluation (Signed)
Anesthesia Post Note  Patient: Cory Gonzalez  Procedure(s) Performed: EXCISION OF EAR MOH'S DEFECT (Left) ADJACENT TISSUE TRANSFER;  SKIN GRAFT (Left) POSSIBLE FLAP ROTATION; POSSIBLE EAR CARTILAGE GRAFT (Left)     Patient location during evaluation: PACU Anesthesia Type: MAC Level of consciousness: awake and alert Pain management: pain level controlled Vital Signs Assessment: post-procedure vital signs reviewed and stable Respiratory status: spontaneous breathing, nonlabored ventilation, respiratory function stable and patient connected to nasal cannula oxygen Cardiovascular status: stable and blood pressure returned to baseline Postop Assessment: no apparent nausea or vomiting Anesthetic complications: no   No notable events documented.  Last Vitals:  Vitals:   03/29/22 1630 03/29/22 1651  BP: (!) 140/88 (!) 143/85  Pulse: 64 74  Resp: 13 16  Temp:  36.4 C  SpO2: 97% 99%    Last Pain:  Vitals:   03/29/22 1651  TempSrc:   PainSc: [place]

## 2022-04-06 DIAGNOSIS — W1830XA Fall on same level, unspecified, initial encounter: Secondary | ICD-10-CM | POA: Diagnosis not present

## 2022-04-06 DIAGNOSIS — C44219 Basal cell carcinoma of skin of left ear and external auricular canal: Secondary | ICD-10-CM | POA: Diagnosis not present

## 2022-04-06 DIAGNOSIS — Z720 Tobacco use: Secondary | ICD-10-CM | POA: Diagnosis not present

## 2022-04-06 DIAGNOSIS — H61112 Acquired deformity of pinna, left ear: Secondary | ICD-10-CM | POA: Diagnosis not present

## 2022-04-13 DIAGNOSIS — W1830XA Fall on same level, unspecified, initial encounter: Secondary | ICD-10-CM | POA: Diagnosis not present

## 2022-04-13 DIAGNOSIS — C44219 Basal cell carcinoma of skin of left ear and external auricular canal: Secondary | ICD-10-CM | POA: Diagnosis not present

## 2022-04-13 DIAGNOSIS — H61112 Acquired deformity of pinna, left ear: Secondary | ICD-10-CM | POA: Diagnosis not present

## 2022-04-13 DIAGNOSIS — Z720 Tobacco use: Secondary | ICD-10-CM | POA: Diagnosis not present

## 2022-04-27 DIAGNOSIS — C44219 Basal cell carcinoma of skin of left ear and external auricular canal: Secondary | ICD-10-CM | POA: Diagnosis not present

## 2022-04-27 DIAGNOSIS — Z720 Tobacco use: Secondary | ICD-10-CM | POA: Diagnosis not present

## 2022-04-27 DIAGNOSIS — H61112 Acquired deformity of pinna, left ear: Secondary | ICD-10-CM | POA: Diagnosis not present

## 2022-05-09 DIAGNOSIS — R010 Benign and innocent cardiac murmurs: Secondary | ICD-10-CM | POA: Diagnosis not present

## 2022-05-15 DIAGNOSIS — M0589 Other rheumatoid arthritis with rheumatoid factor of multiple sites: Secondary | ICD-10-CM | POA: Diagnosis not present

## 2022-06-14 DIAGNOSIS — Z79899 Other long term (current) drug therapy: Secondary | ICD-10-CM | POA: Diagnosis not present

## 2022-06-14 DIAGNOSIS — M0589 Other rheumatoid arthritis with rheumatoid factor of multiple sites: Secondary | ICD-10-CM | POA: Diagnosis not present

## 2022-06-14 DIAGNOSIS — I10 Essential (primary) hypertension: Secondary | ICD-10-CM | POA: Diagnosis not present

## 2022-06-14 DIAGNOSIS — M4120 Other idiopathic scoliosis, site unspecified: Secondary | ICD-10-CM | POA: Diagnosis not present

## 2022-06-14 DIAGNOSIS — R7612 Nonspecific reaction to cell mediated immunity measurement of gamma interferon antigen response without active tuberculosis: Secondary | ICD-10-CM | POA: Diagnosis not present

## 2022-06-14 DIAGNOSIS — M1990 Unspecified osteoarthritis, unspecified site: Secondary | ICD-10-CM | POA: Diagnosis not present

## 2022-06-14 DIAGNOSIS — M549 Dorsalgia, unspecified: Secondary | ICD-10-CM | POA: Diagnosis not present

## 2022-06-27 DIAGNOSIS — C44229 Squamous cell carcinoma of skin of left ear and external auricular canal: Secondary | ICD-10-CM | POA: Diagnosis not present

## 2022-06-27 DIAGNOSIS — C44219 Basal cell carcinoma of skin of left ear and external auricular canal: Secondary | ICD-10-CM | POA: Diagnosis not present

## 2022-06-27 DIAGNOSIS — H61112 Acquired deformity of pinna, left ear: Secondary | ICD-10-CM | POA: Diagnosis not present

## 2022-06-27 DIAGNOSIS — Z720 Tobacco use: Secondary | ICD-10-CM | POA: Diagnosis not present

## 2022-06-27 DIAGNOSIS — H938X2 Other specified disorders of left ear: Secondary | ICD-10-CM | POA: Diagnosis not present

## 2022-06-28 DIAGNOSIS — Z720 Tobacco use: Secondary | ICD-10-CM | POA: Diagnosis not present

## 2022-06-28 DIAGNOSIS — C44229 Squamous cell carcinoma of skin of left ear and external auricular canal: Secondary | ICD-10-CM | POA: Diagnosis not present

## 2022-06-28 DIAGNOSIS — C44219 Basal cell carcinoma of skin of left ear and external auricular canal: Secondary | ICD-10-CM | POA: Diagnosis not present

## 2022-06-28 DIAGNOSIS — H61112 Acquired deformity of pinna, left ear: Secondary | ICD-10-CM | POA: Diagnosis not present

## 2022-07-02 DIAGNOSIS — J432 Centrilobular emphysema: Secondary | ICD-10-CM | POA: Diagnosis not present

## 2022-07-02 DIAGNOSIS — E0590 Thyrotoxicosis, unspecified without thyrotoxic crisis or storm: Secondary | ICD-10-CM | POA: Diagnosis not present

## 2022-07-02 DIAGNOSIS — I10 Essential (primary) hypertension: Secondary | ICD-10-CM | POA: Diagnosis not present

## 2022-07-02 DIAGNOSIS — D649 Anemia, unspecified: Secondary | ICD-10-CM | POA: Diagnosis not present

## 2022-07-02 DIAGNOSIS — D692 Other nonthrombocytopenic purpura: Secondary | ICD-10-CM | POA: Diagnosis not present

## 2022-07-02 DIAGNOSIS — I4891 Unspecified atrial fibrillation: Secondary | ICD-10-CM | POA: Diagnosis not present

## 2022-07-02 DIAGNOSIS — I5032 Chronic diastolic (congestive) heart failure: Secondary | ICD-10-CM | POA: Diagnosis not present

## 2022-07-02 DIAGNOSIS — D6869 Other thrombophilia: Secondary | ICD-10-CM | POA: Diagnosis not present

## 2022-07-02 DIAGNOSIS — N182 Chronic kidney disease, stage 2 (mild): Secondary | ICD-10-CM | POA: Diagnosis not present

## 2022-07-02 DIAGNOSIS — I77810 Thoracic aortic ectasia: Secondary | ICD-10-CM | POA: Diagnosis not present

## 2022-07-02 DIAGNOSIS — I8310 Varicose veins of unspecified lower extremity with inflammation: Secondary | ICD-10-CM | POA: Diagnosis not present

## 2022-07-02 DIAGNOSIS — M0589 Other rheumatoid arthritis with rheumatoid factor of multiple sites: Secondary | ICD-10-CM | POA: Diagnosis not present

## 2022-07-04 DIAGNOSIS — H26492 Other secondary cataract, left eye: Secondary | ICD-10-CM | POA: Diagnosis not present

## 2022-07-04 DIAGNOSIS — H0100A Unspecified blepharitis right eye, upper and lower eyelids: Secondary | ICD-10-CM | POA: Diagnosis not present

## 2022-07-04 DIAGNOSIS — H5203 Hypermetropia, bilateral: Secondary | ICD-10-CM | POA: Diagnosis not present

## 2022-07-04 DIAGNOSIS — H0100B Unspecified blepharitis left eye, upper and lower eyelids: Secondary | ICD-10-CM | POA: Diagnosis not present

## 2022-07-04 DIAGNOSIS — H40053 Ocular hypertension, bilateral: Secondary | ICD-10-CM | POA: Diagnosis not present

## 2022-07-04 DIAGNOSIS — H524 Presbyopia: Secondary | ICD-10-CM | POA: Diagnosis not present

## 2022-07-04 DIAGNOSIS — Z961 Presence of intraocular lens: Secondary | ICD-10-CM | POA: Diagnosis not present

## 2022-07-04 DIAGNOSIS — H16143 Punctate keratitis, bilateral: Secondary | ICD-10-CM | POA: Diagnosis not present

## 2022-07-04 DIAGNOSIS — H0288A Meibomian gland dysfunction right eye, upper and lower eyelids: Secondary | ICD-10-CM | POA: Diagnosis not present

## 2022-07-04 DIAGNOSIS — H0288B Meibomian gland dysfunction left eye, upper and lower eyelids: Secondary | ICD-10-CM | POA: Diagnosis not present

## 2022-07-04 DIAGNOSIS — H52203 Unspecified astigmatism, bilateral: Secondary | ICD-10-CM | POA: Diagnosis not present

## 2022-07-09 DIAGNOSIS — C44229 Squamous cell carcinoma of skin of left ear and external auricular canal: Secondary | ICD-10-CM | POA: Diagnosis not present

## 2022-07-11 DIAGNOSIS — J432 Centrilobular emphysema: Secondary | ICD-10-CM | POA: Diagnosis not present

## 2022-07-11 DIAGNOSIS — N182 Chronic kidney disease, stage 2 (mild): Secondary | ICD-10-CM | POA: Diagnosis not present

## 2022-07-11 DIAGNOSIS — I77810 Thoracic aortic ectasia: Secondary | ICD-10-CM | POA: Diagnosis not present

## 2022-07-11 DIAGNOSIS — D692 Other nonthrombocytopenic purpura: Secondary | ICD-10-CM | POA: Diagnosis not present

## 2022-07-11 DIAGNOSIS — Z0000 Encounter for general adult medical examination without abnormal findings: Secondary | ICD-10-CM | POA: Diagnosis not present

## 2022-07-11 DIAGNOSIS — I10 Essential (primary) hypertension: Secondary | ICD-10-CM | POA: Diagnosis not present

## 2022-07-11 DIAGNOSIS — E0590 Thyrotoxicosis, unspecified without thyrotoxic crisis or storm: Secondary | ICD-10-CM | POA: Diagnosis not present

## 2022-07-11 DIAGNOSIS — I4891 Unspecified atrial fibrillation: Secondary | ICD-10-CM | POA: Diagnosis not present

## 2022-07-11 DIAGNOSIS — D6869 Other thrombophilia: Secondary | ICD-10-CM | POA: Diagnosis not present

## 2022-07-11 DIAGNOSIS — R269 Unspecified abnormalities of gait and mobility: Secondary | ICD-10-CM | POA: Diagnosis not present

## 2022-07-11 DIAGNOSIS — I5032 Chronic diastolic (congestive) heart failure: Secondary | ICD-10-CM | POA: Diagnosis not present

## 2022-07-11 DIAGNOSIS — M0589 Other rheumatoid arthritis with rheumatoid factor of multiple sites: Secondary | ICD-10-CM | POA: Diagnosis not present

## 2022-08-01 NOTE — Therapy (Signed)
OUTPATIENT PHYSICAL THERAPY NEURO EVALUATION   Patient Name: Cory Gonzalez MRN: [id number] DOB:January 06, 1930, 89 y.o., male Today's Date: 08/08/2022   END OF SESSION:  PT End of Session - 08/08/22 0930     Visit Number 1    Date for PT Re-Evaluation 10/03/22    Authorization Type Medicare & BCBS    PT Start Time 0930    PT Stop Time 1020    PT Time Calculation (min) 50 min    Activity Tolerance Patient tolerated treatment well    Behavior During Therapy WFL for tasks assessed/performed             Past Medical History:  Diagnosis Date   Chronic diastolic heart failure (HCC)    Dysrhythmia    A-fib   FUO (fever of unknown origin) 07/27/2019   Heart murmur    since childhood, never has had any problems   Hypertension    Paroxysmal atrial fibrillation (HCC)    Pneumonia    PONV (postoperative nausea and vomiting)    a little nausea   Rheumatoid arthritis (HCC)    Tick bite 07/27/2019   Past Surgical History:  Procedure Laterality Date   CATARACT EXTRACTION Bilateral    COLONOSCOPY     EXCISION MASS HEAD Left 03/29/2022   Procedure: EXCISION OF EAR MOH'S DEFECT;  Surgeon: Scarlette Ar, MD;  Location: [facility] OR;  Service: ENT;  Laterality: Left;   NASAL FLAP ROTATION Left 03/29/2022   Procedure: POSSIBLE FLAP ROTATION; POSSIBLE EAR CARTILAGE GRAFT;  Surgeon: Scarlette Ar, MD;  Location: [facility] OR;  Service: ENT;  Laterality: Left;   ORIF ELBOW FRACTURE Right 09/18/2017   Procedure: OPEN REDUCTION INTERNAL FIXATION (ORIF) RIGHT ELBOW/OLECRANON FRACTURE;  Surgeon: Eldred Manges, MD;  Location: [facility] OR;  Service: Orthopedics;  Laterality: Right;   PROSTATE SURGERY     SKIN FULL THICKNESS GRAFT Left 03/29/2022   Procedure: ADJACENT TISSUE TRANSFER;  SKIN GRAFT;  Surgeon: Scarlette Ar, MD;  Location: [facility] OR;  Service: ENT;  Laterality: Left;   Patient Active Problem List   Diagnosis Date Noted   FUO (fever of unknown origin) 07/27/2019   Tick bite 07/27/2019   Pneumothorax on  right 09/19/2017   Olecranon fracture 09/18/2017   Blepharitis of upper and lower eyelids of both eyes 04/19/2016   Pseudophakia of both eyes 04/19/2016   TB lung, latent 06/05/2011   Ocular hypertension, 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   Rheumatoid arthritis (HCC) 02/16/2004    PCP: [facility]   REFERRING PROVIDER:  Georgianne Fick, MD   REFERRING DIAG: R26.9 (ICD-10-CM) - Gait abnormality    THERAPY DIAG:  Repeated falls  Unsteadiness on feet  Other abnormalities of gait and mobility  Muscle weakness (generalized)  RATIONALE FOR EVALUATION AND TREATMENT: Rehabilitation  ONSET DATE: Progressive decline over past few years  NEXT MD VISIT: 02/20/23   SUBJECTIVE:  SUBJECTIVE STATEMENT: Pt reports he has problems getting up out of a chair, as well as with walking an balance. Feels weaker than he used to. Has started using rollator as of Feb 2024; previously used a cane. He has several falls (at least 6) in the past 6 months. Most significant injury occurred with fall in the garden in May where he bruised his ribs (no x-rays taken at the time).  Pt accompanied by: significant other - wife - [name]  PAIN: Are you having pain? Yes: NPRS scale: 0/10 currently, typically up to 5/10 Pain location: R>L shoulder/RTC Pain description: achy Aggravating factors: reaching, holding on to rollator Relieving factors: Tylenol  PERTINENT HISTORY:  Paroxysmal A-fib, chronic DHF, heart murmur, generalized OA, RA, COPD/emphysema, HTN, HLD, CKD, R elbow/olecranon fracture 2019, RLS  PRECAUTIONS: Fall  WEIGHT BEARING RESTRICTIONS: No  FALLS:  Has patient fallen in last 6 months? Yes. Number of falls >6  LIVING ENVIRONMENT: Lives with: lives with their spouse Lives in:  House/apartment Stairs: Yes: Internal: 1 steps; sunken room and External: 1 steps; ramp Has following equipment at home: Single point cane, Walker - 2 wheeled, Environmental consultant - 4 wheeled, Wheelchair (manual), Ramped entry, and transport chair  OCCUPATION: Retired  PLOF: Independent and Leisure: most sedentary  PATIENT GOALS: "To do better with my balance, my walking and getting up out of a chair."   OBJECTIVE: (objective measures completed at initial evaluation unless otherwise dated)  DIAGNOSTIC FINDINGS:  03/13/22 - CT cervical spine s/p fall: IMPRESSION: 1. No evidence of acute fracture to the cervical spine. 2. Nonspecific straightening of the expected cervical lordosis. 3. Grade 1 anterolisthesis at C2-C3, C3-C4, C6-C7 and T1-T2. 4. Cervical spondylosis, as described.  03/13/22 Head CT s/p fall with trauma to the head and face: IMPRESSION:  No acute or traumatic finding. Age related volume loss and chronic small-vessel ischemic change of the white matter.  COGNITION: Overall cognitive status: Within functional limits for tasks assessed   SENSATION: B LE RLS  COORDINATION: Slowed reciprocal movements  MUSCLE LENGTH: (TBA as indicated) Hamstrings:  ITB:  Piriformis:  Hip flexors:  Quads:  Heelcord:   POSTURE:  rounded shoulders, forward head, decreased lumbar lordosis, increased thoracic kyphosis, flexed trunk , and significant kyphoscoliosis  LOWER EXTREMITY MMT:  (tested in sitting on eval)  MMT Right Eval Left Eval  Hip flexion 4- 4-  Hip extension 4 4-  Hip abduction 4+ 4  Hip adduction 5 4+  Hip internal rotation 4+ 4-  Hip external rotation 4- 4-  Knee flexion 4 4-  Knee extension 4+ 4  Ankle dorsiflexion 3+ 3+  Ankle plantarflexion    Ankle inversion    Ankle eversion    (Blank rows = not tested)  BED MOBILITY:  NT  TRANSFERS: Assistive device utilized: Environmental consultant - 4 wheeled  Sit to stand: SBA and CGA - CGA when performing without UE assist Stand to  sit: SBA Chair to chair: SBA Floor:  NT  GAIT: Distance walked: 80 ft Assistive device utilized: Environmental consultant - 4 wheeled Level of assistance: Modified independence and SBA Gait pattern: step through pattern, decreased stride length, decreased hip/knee flexion- Right, decreased hip/knee flexion- Left, and trunk flexed Comments:  RAMP: Level of Assistance:  NT Assistive device utilized: NT Ramp Comments:   CURB:  Level of Assistance:  NT Assistive device utilized:  NT Curb Comments:   STAIRS:  Level of Assistance: NT  Stair Negotiation Technique:   Number of Stairs:    Height of  Stairs:   Comments:   FUNCTIONAL TESTS:  5 times sit to stand: 13.72 sec with B UE assist; able to complete single STS w/o UE assist but unable to maintain balance Timed up and go (TUG): TBA 10 meter walk test: TBA Berg Balance Scale: 33/56; < 36 high risk for falls (close to 100%) Dynamic Gait Index: TBA  PATIENT SURVEYS:  ABC scale 520 / 1600 = 32.5 %   TODAY'S TREATMENT:   08/08/22 Eval only   PATIENT EDUCATION:  Education details: PT eval findings, anticipated POC, and need for further assessment of balance during gait   Person educated: Patient and Spouse Education method: Explanation Education comprehension: verbalized understanding  HOME EXERCISE PROGRAM: TBD   ASSESSMENT:  CLINICAL IMPRESSION: Cory Gonzalez is a 89 y.o. male who was seen today for physical therapy evaluation and treatment for generalized weakness and gait abnormality.  He notes progressive decline in his strength, ability to transition sit to stand, balance and gait over recent years leading to him switching from a cane to a rollator for ambulation as of February of this year.  Despite this, he has had several falls within the past 6 months.  He denies dizziness or lightheadedness.  Current deficits include intermittent LBP and R>L shoulder pain, postural abnormalities, BLE weakness, increased dependence on UE assist  for sit to stand transitions, decreased balance and unsteady gait.  Berg score of 33/56 indicating a high risk for falls, with further dynamic balance assessment during gait indicated.  Vashawn "Ray" will benefit from skilled PT to address above deficits to improve mobility and activity tolerance with decreased risk for falls.   OBJECTIVE IMPAIRMENTS: Abnormal gait, decreased activity tolerance, decreased balance, decreased coordination, decreased knowledge of condition, decreased knowledge of use of DME, decreased mobility, difficulty walking, decreased ROM, decreased strength, decreased safety awareness, impaired perceived functional ability, impaired flexibility, impaired sensation, improper body mechanics, postural dysfunction, and pain.   ACTIVITY LIMITATIONS: carrying, lifting, bending, standing, squatting, stairs, transfers, bed mobility, locomotion level, and caring for others  PARTICIPATION LIMITATIONS: meal prep, cleaning, laundry, driving, community activity, and yard work  PERSONAL FACTORS: Age, Fitness, Past/current experiences, Time since onset of injury/illness/exacerbation, and 3+ comorbidities: Paroxysmal A-fib, chronic DHF, heart murmur, generalized OA, RA, COPD/emphysema, HTN, HLD, CKD, R elbow/olecranon fracture 2019, RLS  are also affecting patient's functional outcome.   REHAB POTENTIAL: Good  CLINICAL DECISION MAKING: Evolving/moderate complexity  EVALUATION COMPLEXITY: Moderate   GOALS: Goals reviewed with patient? Yes  SHORT TERM GOALS: Target date: 09/05/2022  Patient will be independent with initial HEP to improve outcomes and carryover.  Baseline: TBD Goal status: INITIAL  2.  Patient will be educated on strategies to decrease risk of falls.  Baseline:  Goal status: INITIAL  3.  Patient will demonstrate decreased TUG time to </= 15 sec to decrease risk for falls with transitional mobility. Baseline: TBD Goal status: INITIAL  LONG TERM GOALS: Target date:  10/03/2022  Patient will be independent with advanced/ongoing HEP to facilitate ability to maintain/progress functional gains from skilled physical therapy services. Baseline: Goal status: INITIAL  2.  Patient will be able to step up/down curb safely with LRAD for safety with community ambulation and increased ease of access to sunken room in home.  Baseline:  Goal status: INITIAL   3.  Patient will demonstrate improved functional LE strength as demonstrated by ability to independently achieve sit to stand without UE assist or loss of balance. Baseline: Requires B UE assist for majority of sit  to stand; w/o UE assist, he requires increased momentum with LOB observed upon reaching standing Goal status: INITIAL  4.  Patient will improve 5x STS time to </= 14.8 seconds w/o UE assist for improved efficiency and safety with transfers per age-matched norms. Baseline: 13.72 sec with B UE assist; able to complete single STS w/o UE assist but unable to maintain balance Goal status: INITIAL   5.  Patient will demonstrate gait speed of >/= 2.62 ft/sec (0.8 m/s) to be a safe community ambulator with decreased risk for recurrent falls.  Baseline: TBA Goal status: INITIAL  6.  Patient will improve Berg score to >/= 41/56 to improve safety and stability with ADLs in standing and reduce risk for falls. (MCID= 8 points)  Baseline: 33/56  (43/56 as of DC from PT in 2019) Goal status: INITIAL  7.  Patient will demonstrate at least 19/24 on DGI to improve gait stability and reduce risk for falls. Baseline: TBA  (20/24 as of DC from PT in 2019) Goal status: INITIAL  8  Patient will report >/= 50% on ABD scale to demonstrate improved functional ability. Baseline: 520 / 1600 = 32.5 % Goal status: INITIAL   PLAN:  PT FREQUENCY: 1-2x/week, starting at 2x/wk, but reducing frequency to 1x/wk if too overwhelmed  PT DURATION: 8 weeks  PLANNED INTERVENTIONS: Therapeutic exercises, Therapeutic activity,  Neuromuscular re-education, Balance training, Gait training, Patient/Family education, Self Care, Joint mobilization, Stair training, DME instructions, Dry Needling, Electrical stimulation, Cryotherapy, Moist heat, Taping, Ultrasound, Manual therapy, and Re-evaluation  PLAN FOR NEXT SESSION: Complete balance assessment - 10MWT/gait speed, TUG and DGI; create initial HEP - possibly [place] fall prevention program   Marry Guan, PT 08/08/2022, 1:55 PM

## 2022-08-08 ENCOUNTER — Encounter: Payer: Self-pay | Admitting: Physical Therapy

## 2022-08-08 ENCOUNTER — Other Ambulatory Visit: Payer: Self-pay

## 2022-08-08 ENCOUNTER — Ambulatory Visit: Payer: Medicare Other | Attending: Internal Medicine | Admitting: Physical Therapy

## 2022-08-08 DIAGNOSIS — R2689 Other abnormalities of gait and mobility: Secondary | ICD-10-CM | POA: Insufficient documentation

## 2022-08-08 DIAGNOSIS — R296 Repeated falls: Secondary | ICD-10-CM | POA: Diagnosis not present

## 2022-08-08 DIAGNOSIS — R2681 Unsteadiness on feet: Secondary | ICD-10-CM | POA: Insufficient documentation

## 2022-08-08 DIAGNOSIS — M6281 Muscle weakness (generalized): Secondary | ICD-10-CM | POA: Diagnosis not present

## 2022-08-13 DIAGNOSIS — Z85828 Personal history of other malignant neoplasm of skin: Secondary | ICD-10-CM | POA: Diagnosis not present

## 2022-08-13 DIAGNOSIS — Z08 Encounter for follow-up examination after completed treatment for malignant neoplasm: Secondary | ICD-10-CM | POA: Diagnosis not present

## 2022-08-13 DIAGNOSIS — C44319 Basal cell carcinoma of skin of other parts of face: Secondary | ICD-10-CM | POA: Diagnosis not present

## 2022-08-13 DIAGNOSIS — Z48817 Encounter for surgical aftercare following surgery on the skin and subcutaneous tissue: Secondary | ICD-10-CM | POA: Diagnosis not present

## 2022-08-14 ENCOUNTER — Encounter: Payer: Self-pay | Admitting: Physical Therapy

## 2022-08-14 ENCOUNTER — Ambulatory Visit: Payer: Medicare Other | Admitting: Physical Therapy

## 2022-08-14 DIAGNOSIS — R2689 Other abnormalities of gait and mobility: Secondary | ICD-10-CM | POA: Diagnosis not present

## 2022-08-14 DIAGNOSIS — R2681 Unsteadiness on feet: Secondary | ICD-10-CM | POA: Diagnosis not present

## 2022-08-14 DIAGNOSIS — M6281 Muscle weakness (generalized): Secondary | ICD-10-CM

## 2022-08-14 DIAGNOSIS — R296 Repeated falls: Secondary | ICD-10-CM | POA: Diagnosis not present

## 2022-08-14 NOTE — Therapy (Addendum)
OUTPATIENT PHYSICAL THERAPY TREATMENT   Patient Name: Cory Gonzalez MRN: [id number] DOB:1929/08/18, 89 y.o., male Today's Date: 08/14/2022   END OF SESSION:  PT End of Session - 08/14/22 0850     Visit Number 2    Date for PT Re-Evaluation 10/03/22    Authorization Type Medicare & BCBS    PT Start Time 0850    PT Stop Time 0932    PT Time Calculation (min) 42 min    Activity Tolerance Patient tolerated treatment well;Patient limited by fatigue    Behavior During Therapy [facility] for tasks assessed/performed              Past Medical History:  Diagnosis Date   Chronic diastolic heart failure (HCC)    Dysrhythmia    A-fib   FUO (fever of unknown origin) 07/27/2019   Heart murmur    since childhood, never has had any problems   Hypertension    Paroxysmal atrial fibrillation (HCC)    Pneumonia    PONV (postoperative nausea and vomiting)    a little nausea   Rheumatoid arthritis (HCC)    Tick bite 07/27/2019   Past Surgical History:  Procedure Laterality Date   CATARACT EXTRACTION Bilateral    COLONOSCOPY     EXCISION MASS HEAD Left 03/29/2022   Procedure: EXCISION OF EAR MOH'S DEFECT;  Surgeon: Scarlette Ar, MD;  Location: [facility] OR;  Service: ENT;  Laterality: Left;   NASAL FLAP ROTATION Left 03/29/2022   Procedure: POSSIBLE FLAP ROTATION; POSSIBLE EAR CARTILAGE GRAFT;  Surgeon: Scarlette Ar, MD;  Location: [facility] OR;  Service: ENT;  Laterality: Left;   ORIF ELBOW FRACTURE Right 09/18/2017   Procedure: OPEN REDUCTION INTERNAL FIXATION (ORIF) RIGHT ELBOW/OLECRANON FRACTURE;  Surgeon: Eldred Manges, MD;  Location: [facility] OR;  Service: Orthopedics;  Laterality: Right;   PROSTATE SURGERY     SKIN FULL THICKNESS GRAFT Left 03/29/2022   Procedure: ADJACENT TISSUE TRANSFER;  SKIN GRAFT;  Surgeon: Scarlette Ar, MD;  Location: [facility] OR;  Service: ENT;  Laterality: Left;   Patient Active Problem List   Diagnosis Date Noted   FUO (fever of unknown origin) 07/27/2019   Tick bite 07/27/2019    Pneumothorax on right 09/19/2017   Olecranon fracture 09/18/2017   Blepharitis of upper and lower eyelids of both eyes 04/19/2016   Pseudophakia of both eyes 04/19/2016   TB lung, latent 06/05/2011   Ocular hypertension, 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   Rheumatoid arthritis (HCC) 02/16/2004    PCP: [facility]   REFERRING PROVIDER:  Georgianne Fick, MD   REFERRING DIAG: R26.9 (ICD-10-CM) - Gait abnormality    THERAPY DIAG:  Repeated falls  Unsteadiness on feet  Other abnormalities of gait and mobility  Muscle weakness (generalized)  RATIONALE FOR EVALUATION AND TREATMENT: Rehabilitation  ONSET DATE: Progressive decline over past few years  NEXT MD VISIT: 02/20/23   SUBJECTIVE:  SUBJECTIVE STATEMENT: Pt reports he had a pretty rough 2 days getting around to MD appts due to weakness and balance. He states he is worn out from taking his shower this morning (up since 4 am and took shower at 5 am - takes ~1 hr).  Pt accompanied by: significant other - wife - Cory Gonzalez (in waiting room)  PAIN: Are you having pain? No  PERTINENT HISTORY:  Paroxysmal A-fib, chronic DHF, heart murmur, generalized OA, RA, COPD/emphysema, HTN, HLD, CKD, R elbow/olecranon fracture 2019, RLS  PRECAUTIONS: Fall  WEIGHT BEARING RESTRICTIONS: No  FALLS:  Has patient fallen in last 6 months? Yes. Number of falls >6  LIVING ENVIRONMENT: Lives with: lives with their spouse Lives in: House/apartment Stairs: Yes: Internal: 1 steps; sunken room and External: 1 steps; ramp Has following equipment at home: Single point cane, Walker - 2 wheeled, Environmental consultant - 4 wheeled, Wheelchair (manual), Ramped entry, and transport chair  OCCUPATION: Retired  PLOF: Independent and Leisure: most  sedentary  PATIENT GOALS: "To do better with my balance, my walking and getting up out of a chair."   OBJECTIVE: (objective measures completed at initial evaluation unless otherwise dated)  DIAGNOSTIC FINDINGS:  03/13/22 - CT cervical spine s/p fall: IMPRESSION: 1. No evidence of acute fracture to the cervical spine. 2. Nonspecific straightening of the expected cervical lordosis. 3. Grade 1 anterolisthesis at C2-C3, C3-C4, C6-C7 and T1-T2. 4. Cervical spondylosis, as described.  03/13/22 Head CT s/p fall with trauma to the head and face: IMPRESSION:  No acute or traumatic finding. Age related volume loss and chronic small-vessel ischemic change of the white matter.  COGNITION: Overall cognitive status: Within functional limits for tasks assessed   SENSATION: B LE RLS  COORDINATION: Slowed reciprocal movements  MUSCLE LENGTH: (TBA as indicated) Hamstrings:  ITB:  Piriformis:  Hip flexors:  Quads:  Heelcord:   POSTURE:  rounded shoulders, forward head, decreased lumbar lordosis, increased thoracic kyphosis, flexed trunk , and significant kyphoscoliosis  LOWER EXTREMITY MMT:  (tested in sitting on eval)  MMT Right Eval Left Eval  Hip flexion 4- 4-  Hip extension 4 4-  Hip abduction 4+ 4  Hip adduction 5 4+  Hip internal rotation 4+ 4-  Hip external rotation 4- 4-  Knee flexion 4 4-  Knee extension 4+ 4  Ankle dorsiflexion 3+ 3+  Ankle plantarflexion    Ankle inversion    Ankle eversion    (Blank rows = not tested)  BED MOBILITY:  NT  TRANSFERS: Assistive device utilized: Environmental consultant - 4 wheeled  Sit to stand: SBA and CGA - CGA when performing without UE assist Stand to sit: SBA Chair to chair: SBA Floor:  NT  GAIT: Distance walked: 80 ft Assistive device utilized: Environmental consultant - 4 wheeled Level of assistance: Modified independence and SBA Gait pattern: step through pattern, decreased stride length, decreased hip/knee flexion- Right, decreased hip/knee flexion-  Left, and trunk flexed Comments:  RAMP: Level of Assistance:  NT Assistive device utilized: NT Ramp Comments:   CURB:  Level of Assistance:  NT Assistive device utilized:  NT Curb Comments:   STAIRS:  Level of Assistance: NT  Stair Negotiation Technique:   Number of Stairs:    Height of Stairs:   Comments:   FUNCTIONAL TESTS:  5 times sit to stand: 13.72 sec with B UE assist; able to complete single STS w/o UE assist but unable to maintain balance Timed up and go (TUG): 25.22 sec with rollator - 08/14/22 10 meter walk  test: 17.44 sec with rollator; Gait speed = 1.88 ft sec with rollator - 08/14/22 Berg Balance Scale: 33/56; < 36 high risk for falls (close to 100%) Dynamic Gait Index: 13/24, Scores of 19 or less are predictive of falls in older community living adults - 08/14/22  PATIENT SURVEYS:  ABC scale 520 / 1600 = 32.5 %   TODAY'S TREATMENT:   08/14/22 THERAPEUTIC ACTIVITIES: TUG: 25.22 sec with rollator 10 meter walk test: 17.44 sec with rollator Gait speed: 1.88 ft sec with rollator Berg: 33/56; < 36 high risk for falls (close to 100%) DGI: 13/24, Scores of 19 or less are predictive of falls in older community living adults  Transfer safety - education on locking rollator brakes and reaching back for chair before attempting to sit, as well as pushing up from seat rather than pulling up on rollator during STS  GAIT TRAINING: To normalize gait pattern and improve safety with rollator . 120 ft with rollator - cues for upright posture and improved rollator proximity, aiming for foot placement between rear wheels of rollator  THERAPEUTIC EXERCISE: to improve flexibility, strength and mobility.  Demonstration, verbal and tactile cues throughout for technique.  Seated thoracic lumbar extension with arms crossed on chest x 10 Seated scapular retraction with B shoulder extension/ER x 10  Seated RTB hip ABD clam x 10 Seated RTB hip flexion march  Seated RTB RTB alt ankle DF  x 10 bil   08/08/22 Eval only   PATIENT EDUCATION:  Education details: PT eval findings, anticipated POC, initial HEP, transfer safety, and gait safety with rollator   Person educated: Patient Education method: Explanation, Demonstration, Verbal cues, and Handouts Education comprehension: verbalized understanding, returned demonstration, verbal cues required, and needs further education  HOME EXERCISE PROGRAM: Access Code: [email or web address] URL: [email or web address] Date: 08/14/2022 Prepared by: Glenetta Hew  Exercises - Seated Thoracic Lumbar Extension  - 1 x daily - 7 x weekly - 2 sets - 10 reps - 3 sec hold - Seated Scapular Retraction with External Rotation  - 1 x daily - 7 x weekly - 2 sets - 10 reps - 3 sec hold - Seated Hip Abduction with Resistance  - 1 x daily - 7 x weekly - 2 sets - 10 reps - 3-5 sec hold - Seated March with Resistance  - 1 x daily - 7 x weekly - 2 sets - 10 reps - 3 sec hold - Seated Ankle Dorsiflexion with Resistance  - 1 x daily - 7 x weekly - 2 sets - 10 reps - 3 sec hold   ASSESSMENT:  CLINICAL IMPRESSION: Completed balance assessment with TUG time of 25.22 seconds and DGI score 13/24 indicating high risk for falls, as well as gait speed of 1.88 ft/sec just above the threshold for risk for recurrent falls.  Cory "Ray" demonstrates poor safety awareness with use of rollator during transfers and gait, with education provided for proper positioning of rollator, use of brakes and proper hand placement during transfers, as well as increased upright posture and rollator proximity during ambulation - he was somewhat resistant to PT suggestions, insisting that he was very aware of safety due to awareness of other people his age having experienced falls.  Initiated HEP focusing on postural strengthening as well as hip and ankle ankle strengthening to address weakness identified on eval. Ray was able to provide return demonstration of all exercises as well as  safely position Thera-Band as appropriate for exercises, but will likely require review for confirmation  of technique next visit.  OBJECTIVE IMPAIRMENTS: Abnormal gait, decreased activity tolerance, decreased balance, decreased coordination, decreased knowledge of condition, decreased knowledge of use of DME, decreased mobility, difficulty walking, decreased ROM, decreased strength, decreased safety awareness, impaired perceived functional ability, impaired flexibility, impaired sensation, improper body mechanics, postural dysfunction, and pain.   ACTIVITY LIMITATIONS: carrying, lifting, bending, standing, squatting, stairs, transfers, bed mobility, locomotion level, and caring for others  PARTICIPATION LIMITATIONS: meal prep, cleaning, laundry, driving, community activity, and yard work  PERSONAL FACTORS: Age, Fitness, Past/current experiences, Time since onset of injury/illness/exacerbation, and 3+ comorbidities: Paroxysmal A-fib, chronic DHF, heart murmur, generalized OA, RA, COPD/emphysema, HTN, HLD, CKD, R elbow/olecranon fracture 2019, RLS  are also affecting patient's functional outcome.   REHAB POTENTIAL: Good  CLINICAL DECISION MAKING: Evolving/moderate complexity  EVALUATION COMPLEXITY: Moderate   GOALS: Goals reviewed with patient? Yes  SHORT TERM GOALS: Target date: 09/05/2022  Patient will be independent with initial HEP to improve outcomes and carryover.  Baseline: TBD Goal status: IN PROGRESS  2.  Patient will be educated on strategies to decrease risk of falls.  Baseline:  Goal status: IN PROGRESS  3.  Patient will demonstrate decreased TUG time to </= 20 sec to decrease risk for falls with transitional mobility. Baseline: 25.22 sec with rollator  Goal status: REVISED  LONG TERM GOALS: Target date: 10/03/2022  Patient will be independent with advanced/ongoing HEP to facilitate ability to maintain/progress functional gains from skilled physical therapy  services. Baseline: Goal status: IN PROGRESS  2.  Patient will be able to step up/down curb safely with LRAD for safety with community ambulation and increased ease of access to sunken room in home.  Baseline:  Goal status: IN PROGRESS   3.  Patient will demonstrate improved functional LE strength as demonstrated by ability to independently achieve sit to stand without UE assist or loss of balance. Baseline: Requires B UE assist for majority of sit to stand; w/o UE assist, he requires increased momentum with LOB observed upon reaching standing Goal status: IN PROGRESS  4.  Patient will improve 5x STS time to </= 14.8 seconds w/o UE assist for improved efficiency and safety with transfers per age-matched norms. Baseline: 13.72 sec with B UE assist; able to complete single STS w/o UE assist but unable to maintain balance Goal status: IN PROGRESS   5.  Patient will demonstrate gait speed of >/= 2.62 ft/sec (0.8 m/s) to be a safe community ambulator with decreased risk for recurrent falls.  Baseline: 1.88 ft/sec with rollator Goal status: IN PROGRESS  6.  Patient will improve Berg score to >/= 41/56 to improve safety and stability with ADLs in standing and reduce risk for falls. (MCID= 8 points)  Baseline: 33/56  (43/56 as of DC from PT in 2019) Goal status: IN PROGRESS  7.  Patient will demonstrate at least 19/24 on DGI to improve gait stability and reduce risk for falls. Baseline: 13/24 on 08/14/22  (20/24 as of DC from PT in 2019) Goal status: IN PROGRESS  8  Patient will report >/= 50% on ABD scale to demonstrate improved functional ability. Baseline: 520 / 1600 = 32.5 % Goal status: IN PROGRESS   PLAN:  PT FREQUENCY: 1-2x/week, starting at 2x/wk, but reducing frequency to 1x/wk if too overwhelmed  PT DURATION: 8 weeks  PLANNED INTERVENTIONS: Therapeutic exercises, Therapeutic activity, Neuromuscular re-education, Balance training, Gait training, Patient/Family education, Self  Care, Joint mobilization, Stair training, DME instructions, Dry Needling, Electrical stimulation, Cryotherapy, Moist heat, Taping,  Ultrasound, Manual therapy, and Re-evaluation  PLAN FOR NEXT SESSION: Review and progress initial HEP - postural and core/LE strengthening; balance training, possibly integrating components of [place] fall prevention program   Marry Guan, PT 08/14/2022, 10:00 AM

## 2022-08-15 ENCOUNTER — Ambulatory Visit: Payer: Medicare Other

## 2022-08-15 DIAGNOSIS — R2681 Unsteadiness on feet: Secondary | ICD-10-CM

## 2022-08-15 DIAGNOSIS — M6281 Muscle weakness (generalized): Secondary | ICD-10-CM | POA: Diagnosis not present

## 2022-08-15 DIAGNOSIS — R2689 Other abnormalities of gait and mobility: Secondary | ICD-10-CM | POA: Diagnosis not present

## 2022-08-15 DIAGNOSIS — R296 Repeated falls: Secondary | ICD-10-CM | POA: Diagnosis not present

## 2022-08-15 NOTE — Therapy (Signed)
OUTPATIENT PHYSICAL THERAPY TREATMENT   Patient Name: Cory Gonzalez MRN: [id number] DOB:[birth year], 89 y.o., male Today's Date: 08/15/2022   END OF SESSION:  PT End of Session - 08/15/22 1022     Visit Number 3    Date for PT Re-Evaluation 10/03/22    Authorization Type Medicare & BCBS    PT Start Time [phone]    PT Stop Time 1015    PT Time Calculation (min) 44 min    Activity Tolerance Patient tolerated treatment well;Patient limited by fatigue    Behavior During Therapy [facility] for tasks assessed/performed               Past Medical History:  Diagnosis Date   Chronic diastolic heart failure (HCC)    Dysrhythmia    A-fib   FUO (fever of unknown origin) 07/27/2019   Heart murmur    since childhood, never has had any problems   Hypertension    Paroxysmal atrial fibrillation (HCC)    Pneumonia    PONV (postoperative nausea and vomiting)    a little nausea   Rheumatoid arthritis (HCC)    Tick bite 07/27/2019   Past Surgical History:  Procedure Laterality Date   CATARACT EXTRACTION Bilateral    COLONOSCOPY     EXCISION MASS HEAD Left 03/29/2022   Procedure: EXCISION OF EAR MOH'S DEFECT;  Surgeon: Scarlette Ar, MD;  Location: [facility] OR;  Service: ENT;  Laterality: Left;   NASAL FLAP ROTATION Left 03/29/2022   Procedure: POSSIBLE FLAP ROTATION; POSSIBLE EAR CARTILAGE GRAFT;  Surgeon: Scarlette Ar, MD;  Location: [facility] OR;  Service: ENT;  Laterality: Left;   ORIF ELBOW FRACTURE Right 09/18/2017   Procedure: OPEN REDUCTION INTERNAL FIXATION (ORIF) RIGHT ELBOW/OLECRANON FRACTURE;  Surgeon: Eldred Manges, MD;  Location: [facility] OR;  Service: Orthopedics;  Laterality: Right;   PROSTATE SURGERY     SKIN FULL THICKNESS GRAFT Left 03/29/2022   Procedure: ADJACENT TISSUE TRANSFER;  SKIN GRAFT;  Surgeon: Scarlette Ar, MD;  Location: [facility] OR;  Service: ENT;  Laterality: Left;   Patient Active Problem List   Diagnosis Date Noted   FUO (fever of unknown origin) 07/27/2019   Tick bite 07/27/2019    Pneumothorax on right 09/19/2017   Olecranon fracture 09/18/2017   Blepharitis of upper and lower eyelids of both eyes 04/19/2016   Pseudophakia of both eyes 04/19/2016   TB lung, latent 06/05/2011   Ocular hypertension, 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   Rheumatoid arthritis (HCC) 02/16/2004    PCP: [facility]   REFERRING PROVIDER:  Georgianne Fick, MD   REFERRING DIAG: R26.9 (ICD-10-CM) - Gait abnormality    THERAPY DIAG:  Repeated falls  Unsteadiness on feet  Other abnormalities of gait and mobility  Muscle weakness (generalized)  RATIONALE FOR EVALUATION AND TREATMENT: Rehabilitation  ONSET DATE: Progressive decline over past few years  NEXT MD VISIT: 02/20/23   SUBJECTIVE:  SUBJECTIVE STATEMENT: Pt reports he tried to do his HEP after last visit, "Didn't do too well."  Pt accompanied by: significant other - wife - [name] (in waiting room)  PAIN: Are you having pain? Yes: NPRS scale: 2/10 Pain location: R knee Pain description: aching  PERTINENT HISTORY:  Paroxysmal A-fib, chronic DHF, heart murmur, generalized OA, RA, COPD/emphysema, HTN, HLD, CKD, R elbow/olecranon fracture 2019, RLS  PRECAUTIONS: Fall  WEIGHT BEARING RESTRICTIONS: No  FALLS:  Has patient fallen in last 6 months? Yes. Number of falls >6  LIVING ENVIRONMENT: Lives with: lives with their spouse Lives in: House/apartment Stairs: Yes: Internal: 1 steps; sunken room and External: 1 steps; ramp Has following equipment at home: Single point cane, Walker - 2 wheeled, Environmental consultant - 4 wheeled, Wheelchair (manual), Ramped entry, and transport chair  OCCUPATION: Retired  PLOF: Independent and Leisure: most sedentary  PATIENT GOALS: "To do better with my balance, my walking and getting up  out of a chair."   OBJECTIVE: (objective measures completed at initial evaluation unless otherwise dated)  DIAGNOSTIC FINDINGS:  03/13/22 - CT cervical spine s/p fall: IMPRESSION: 1. No evidence of acute fracture to the cervical spine. 2. Nonspecific straightening of the expected cervical lordosis. 3. Grade 1 anterolisthesis at C2-C3, C3-C4, C6-C7 and T1-T2. 4. Cervical spondylosis, as described.  03/13/22 Head CT s/p fall with trauma to the head and face: IMPRESSION:  No acute or traumatic finding. Age related volume loss and chronic small-vessel ischemic change of the white matter.  COGNITION: Overall cognitive status: Within functional limits for tasks assessed   SENSATION: B LE RLS  COORDINATION: Slowed reciprocal movements  MUSCLE LENGTH: (TBA as indicated) Hamstrings:  ITB:  Piriformis:  Hip flexors:  Quads:  Heelcord:   POSTURE:  rounded shoulders, forward head, decreased lumbar lordosis, increased thoracic kyphosis, flexed trunk , and significant kyphoscoliosis  LOWER EXTREMITY MMT:  (tested in sitting on eval)  MMT Right Eval Left Eval  Hip flexion 4- 4-  Hip extension 4 4-  Hip abduction 4+ 4  Hip adduction 5 4+  Hip internal rotation 4+ 4-  Hip external rotation 4- 4-  Knee flexion 4 4-  Knee extension 4+ 4  Ankle dorsiflexion 3+ 3+  Ankle plantarflexion    Ankle inversion    Ankle eversion    (Blank rows = not tested)  BED MOBILITY:  NT  TRANSFERS: Assistive device utilized: Environmental consultant - 4 wheeled  Sit to stand: SBA and CGA - CGA when performing without UE assist Stand to sit: SBA Chair to chair: SBA Floor:  NT  GAIT: Distance walked: 80 ft Assistive device utilized: Environmental consultant - 4 wheeled Level of assistance: Modified independence and SBA Gait pattern: step through pattern, decreased stride length, decreased hip/knee flexion- Right, decreased hip/knee flexion- Left, and trunk flexed Comments:  RAMP: Level of Assistance:  NT Assistive device  utilized: NT Ramp Comments:   CURB:  Level of Assistance:  NT Assistive device utilized:  NT Curb Comments:   STAIRS:  Level of Assistance: NT  Stair Negotiation Technique:   Number of Stairs:    Height of Stairs:   Comments:   FUNCTIONAL TESTS:  5 times sit to stand: 13.72 sec with B UE assist; able to complete single STS w/o UE assist but unable to maintain balance Timed up and go (TUG): 25.22 sec with rollator - 08/14/22 10 meter walk test: 17.44 sec with rollator; Gait speed = 1.88 ft sec with rollator - 08/14/22 Berg Balance Scale: 33/56; <  36 high risk for falls (close to 100%) Dynamic Gait Index: 13/24, Scores of 19 or less are predictive of falls in older community living adults - 08/14/22  PATIENT SURVEYS:  ABC scale 520 / 1600 = 32.5 %   TODAY'S TREATMENT:  08/15/22 Nustep L3x80min Gait 180 ft with rollator - cues for upright posture and for proximity with rollator; 180 additional feet with rollator Reviewed HEP Seated LAQ x 10  STS x 10  Seated horizontal ABD x 8 YTB- R shld pain Seated lunge into BOSU ball x 10 bil Seated ball squeeze x 10 Seated hip up/over x 10 TB  08/14/22 THERAPEUTIC ACTIVITIES: TUG: 25.22 sec with rollator 10 meter walk test: 17.44 sec with rollator Gait speed: 1.88 ft sec with rollator Berg: 33/56; < 36 high risk for falls (close to 100%) DGI: 13/24, Scores of 19 or less are predictive of falls in older community living adults  Transfer safety - education on locking rollator brakes and reaching back for chair before attempting to sit, as well as pushing up from seat rather than pulling up on rollator during STS  GAIT TRAINING: To normalize gait pattern and improve safety with rollator . 120 ft with rollator - cues for upright posture and improved rollator proximity, aiming for foot placement between rear wheels of rollator  THERAPEUTIC EXERCISE: to improve flexibility, strength and mobility.  Demonstration, verbal and tactile cues  throughout for technique.  Seated thoracic lumbar extension with arms crossed on chest x 10 Seated scapular retraction with B shoulder extension/ER x 10  Seated RTB hip ABD clam x 10 Seated RTB hip flexion march  Seated RTB RTB alt ankle DF x 10 bil   08/08/22 Eval only   PATIENT EDUCATION:  Education details: PT eval findings, anticipated POC, initial HEP, transfer safety, and gait safety with rollator   Person educated: Patient Education method: Explanation, Demonstration, Verbal cues, and Handouts Education comprehension: verbalized understanding, returned demonstration, verbal cues required, and needs further education  HOME EXERCISE PROGRAM: Access Code: [email or web address] URL: [email or web address] Date: 08/14/2022 Prepared by: Glenetta Hew  Exercises - Seated Thoracic Lumbar Extension  - 1 x daily - 7 x weekly - 2 sets - 10 reps - 3 sec hold - Seated Scapular Retraction with External Rotation  - 1 x daily - 7 x weekly - 2 sets - 10 reps - 3 sec hold - Seated Hip Abduction with Resistance  - 1 x daily - 7 x weekly - 2 sets - 10 reps - 3-5 sec hold - Seated March with Resistance  - 1 x daily - 7 x weekly - 2 sets - 10 reps - 3 sec hold - Seated Ankle Dorsiflexion with Resistance  - 1 x daily - 7 x weekly - 2 sets - 10 reps - 3 sec hold   ASSESSMENT:  CLINICAL IMPRESSION: Pt presented today reporting he attempted HEP again yesterday after session, reports the exercises didn't go so well. I told him it was ok to take rest days if needed with exercises, when feeling sore. He showed better demonstration of gait training today with better posture after cues the initial trial of gait. Cues required with all exercises to go slow and correct form  OBJECTIVE IMPAIRMENTS: Abnormal gait, decreased activity tolerance, decreased balance, decreased coordination, decreased knowledge of condition, decreased knowledge of use of DME, decreased mobility, difficulty walking, decreased ROM,  decreased strength, decreased safety awareness, impaired perceived functional ability, impaired flexibility, impaired sensation, improper body mechanics, postural dysfunction, and  pain.   ACTIVITY LIMITATIONS: carrying, lifting, bending, standing, squatting, stairs, transfers, bed mobility, locomotion level, and caring for others  PARTICIPATION LIMITATIONS: meal prep, cleaning, laundry, driving, community activity, and yard work  PERSONAL FACTORS: Age, Fitness, Past/current experiences, Time since onset of injury/illness/exacerbation, and 3+ comorbidities: Paroxysmal A-fib, chronic DHF, heart murmur, generalized OA, RA, COPD/emphysema, HTN, HLD, CKD, R elbow/olecranon fracture 2019, RLS  are also affecting patient's functional outcome.   REHAB POTENTIAL: Good  CLINICAL DECISION MAKING: Evolving/moderate complexity  EVALUATION COMPLEXITY: Moderate   GOALS: Goals reviewed with patient? Yes  SHORT TERM GOALS: Target date: 09/05/2022  Patient will be independent with initial HEP to improve outcomes and carryover.  Baseline: TBD Goal status: IN PROGRESS  2.  Patient will be educated on strategies to decrease risk of falls.  Baseline:  Goal status: IN PROGRESS  3.  Patient will demonstrate decreased TUG time to </= 20 sec to decrease risk for falls with transitional mobility. Baseline: 25.22 sec with rollator  Goal status: REVISED  LONG TERM GOALS: Target date: 10/03/2022  Patient will be independent with advanced/ongoing HEP to facilitate ability to maintain/progress functional gains from skilled physical therapy services. Baseline: Goal status: IN PROGRESS  2.  Patient will be able to step up/down curb safely with LRAD for safety with community ambulation and increased ease of access to sunken room in home.  Baseline:  Goal status: IN PROGRESS   3.  Patient will demonstrate improved functional LE strength as demonstrated by ability to independently achieve sit to stand without UE  assist or loss of balance. Baseline: Requires B UE assist for majority of sit to stand; w/o UE assist, he requires increased momentum with LOB observed upon reaching standing Goal status: IN PROGRESS  4.  Patient will improve 5x STS time to </= 14.8 seconds w/o UE assist for improved efficiency and safety with transfers per age-matched norms. Baseline: 13.72 sec with B UE assist; able to complete single STS w/o UE assist but unable to maintain balance Goal status: IN PROGRESS   5.  Patient will demonstrate gait speed of >/= 2.62 ft/sec (0.8 m/s) to be a safe community ambulator with decreased risk for recurrent falls.  Baseline: 1.88 ft/sec with rollator Goal status: IN PROGRESS  6.  Patient will improve Berg score to >/= 41/56 to improve safety and stability with ADLs in standing and reduce risk for falls. (MCID= 8 points)  Baseline: 33/56  (43/56 as of DC from PT in 2019) Goal status: IN PROGRESS  7.  Patient will demonstrate at least 19/24 on DGI to improve gait stability and reduce risk for falls. Baseline: 13/24 on 08/14/22  (20/24 as of DC from PT in 2019) Goal status: IN PROGRESS  8  Patient will report >/= 50% on ABD scale to demonstrate improved functional ability. Baseline: 520 / 1600 = 32.5 % Goal status: IN PROGRESS   PLAN:  PT FREQUENCY: 1-2x/week, starting at 2x/wk, but reducing frequency to 1x/wk if too overwhelmed  PT DURATION: 8 weeks  PLANNED INTERVENTIONS: Therapeutic exercises, Therapeutic activity, Neuromuscular re-education, Balance training, Gait training, Patient/Family education, Self Care, Joint mobilization, Stair training, DME instructions, Dry Needling, Electrical stimulation, Cryotherapy, Moist heat, Taping, Ultrasound, Manual therapy, and Re-evaluation  PLAN FOR NEXT SESSION: postural and core/LE strengthening; balance training, possibly integrating components of [place] fall prevention program   Darleene Cleaver, PTA 08/15/2022, 10:22 AM

## 2022-08-20 ENCOUNTER — Encounter: Payer: Self-pay | Admitting: Physical Therapy

## 2022-08-20 ENCOUNTER — Ambulatory Visit: Payer: Medicare Other | Admitting: Physical Therapy

## 2022-08-20 DIAGNOSIS — R2681 Unsteadiness on feet: Secondary | ICD-10-CM | POA: Diagnosis not present

## 2022-08-20 DIAGNOSIS — R2689 Other abnormalities of gait and mobility: Secondary | ICD-10-CM

## 2022-08-20 DIAGNOSIS — M6281 Muscle weakness (generalized): Secondary | ICD-10-CM | POA: Diagnosis not present

## 2022-08-20 DIAGNOSIS — R296 Repeated falls: Secondary | ICD-10-CM | POA: Diagnosis not present

## 2022-08-20 NOTE — Therapy (Signed)
OUTPATIENT PHYSICAL THERAPY TREATMENT   Patient Name: Cory Gonzalez MRN: [id number] DOB:[birth year], 89 y.o., male Today's Date: 08/20/2022   END OF SESSION:  PT End of Session - 08/20/22 1106     Visit Number 4    Date for PT Re-Evaluation 10/03/22    Authorization Type Medicare & BCBS    PT Start Time 1106    PT Stop Time 1150    PT Time Calculation (min) 44 min    Activity Tolerance Patient tolerated treatment well;Patient limited by fatigue    Behavior During Therapy WFL for tasks assessed/performed               Past Medical History:  Diagnosis Date   Chronic diastolic heart failure (HCC)    Dysrhythmia    A-fib   FUO (fever of unknown origin) 07/27/2019   Heart murmur    since childhood, never has had any problems   Hypertension    Paroxysmal atrial fibrillation (HCC)    Pneumonia    PONV (postoperative nausea and vomiting)    a little nausea   Rheumatoid arthritis (HCC)    Tick bite 07/27/2019   Past Surgical History:  Procedure Laterality Date   CATARACT EXTRACTION Bilateral    COLONOSCOPY     EXCISION MASS HEAD Left 03/29/2022   Procedure: EXCISION OF EAR MOH'S DEFECT;  Surgeon: Scarlette Ar, MD;  Location: [facility] OR;  Service: ENT;  Laterality: Left;   NASAL FLAP ROTATION Left 03/29/2022   Procedure: POSSIBLE FLAP ROTATION; POSSIBLE EAR CARTILAGE GRAFT;  Surgeon: Scarlette Ar, MD;  Location: [facility] OR;  Service: ENT;  Laterality: Left;   ORIF ELBOW FRACTURE Right 09/18/2017   Procedure: OPEN REDUCTION INTERNAL FIXATION (ORIF) RIGHT ELBOW/OLECRANON FRACTURE;  Surgeon: Eldred Manges, MD;  Location: [facility] OR;  Service: Orthopedics;  Laterality: Right;   PROSTATE SURGERY     SKIN FULL THICKNESS GRAFT Left 03/29/2022   Procedure: ADJACENT TISSUE TRANSFER;  SKIN GRAFT;  Surgeon: Scarlette Ar, MD;  [facility] OR;  Service: ENT;  Laterality: Left;   Patient Active Problem List   Diagnosis Date Noted   FUO (fever of unknown origin) 07/27/2019   Tick bite 07/27/2019    Pneumothorax on right 09/19/2017   Olecranon fracture 09/18/2017   Blepharitis of upper and lower eyelids of both eyes 04/19/2016   Pseudophakia of both eyes 04/19/2016   TB lung, latent 06/05/2011   Ocular hypertension, 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   Rheumatoid arthritis (HCC) 02/16/2004    PCP: [facility]   REFERRING PROVIDER:  Georgianne Fick, MD   REFERRING DIAG: R26.9 (ICD-10-CM) - Gait abnormality    THERAPY DIAG:  Repeated falls  Unsteadiness on feet  Other abnormalities of gait and mobility  Muscle weakness (generalized)  RATIONALE FOR EVALUATION AND TREATMENT: Rehabilitation  ONSET DATE: Progressive decline over past few years  NEXT MD VISIT: 02/20/23   SUBJECTIVE:  SUBJECTIVE STATEMENT: Pt reports "I'm having trouble with my HEP".  Pt accompanied by: significant other - wife - [name] (in waiting room)  PAIN: Are you having pain? No  PERTINENT HISTORY:  Paroxysmal A-fib, chronic DHF, heart murmur, generalized OA, RA, COPD/emphysema, HTN, HLD, CKD, R elbow/olecranon fracture 2019, RLS  PRECAUTIONS: Fall  WEIGHT BEARING RESTRICTIONS: No  FALLS:  Has patient fallen in last 6 months? Yes. Number of falls >6  LIVING ENVIRONMENT: Lives with: lives with their spouse Lives in: House/apartment Stairs: Yes: Internal: 1 steps; sunken room and External: 1 steps; ramp Has following equipment at home: Single point cane, Walker - 2 wheeled, Environmental consultant - 4 wheeled, Wheelchair (manual), Ramped entry, and transport chair  OCCUPATION: Retired  PLOF: Independent and Leisure: most sedentary  PATIENT GOALS: "To do better with my balance, my walking and getting up out of a chair."   OBJECTIVE: (objective measures completed at initial evaluation unless  otherwise dated)  DIAGNOSTIC FINDINGS:  03/13/22 - CT cervical spine s/p fall: IMPRESSION: 1. No evidence of acute fracture to the cervical spine. 2. Nonspecific straightening of the expected cervical lordosis. 3. Grade 1 anterolisthesis at C2-C3, C3-C4, C6-C7 and T1-T2. 4. Cervical spondylosis, as described.  03/13/22 Head CT s/p fall with trauma to the head and face: IMPRESSION:  No acute or traumatic finding. Age related volume loss and chronic small-vessel ischemic change of the white matter.  COGNITION: Overall cognitive status: Within functional limits for tasks assessed   SENSATION: B LE RLS  COORDINATION: Slowed reciprocal movements  MUSCLE LENGTH: (TBA as indicated) Hamstrings:  ITB:  Piriformis:  Hip flexors:  Quads:  Heelcord:   POSTURE:  rounded shoulders, forward head, decreased lumbar lordosis, increased thoracic kyphosis, flexed trunk , and significant kyphoscoliosis  LOWER EXTREMITY MMT:  (tested in sitting on eval)  MMT Right Eval Left Eval  Hip flexion 4- 4-  Hip extension 4 4-  Hip abduction 4+ 4  Hip adduction 5 4+  Hip internal rotation 4+ 4-  Hip external rotation 4- 4-  Knee flexion 4 4-  Knee extension 4+ 4  Ankle dorsiflexion 3+ 3+  Ankle plantarflexion    Ankle inversion    Ankle eversion    (Blank rows = not tested)  BED MOBILITY:  NT  TRANSFERS: Assistive device utilized: Environmental consultant - 4 wheeled  Sit to stand: SBA and CGA - CGA when performing without UE assist Stand to sit: SBA Chair to chair: SBA Floor:  NT  GAIT: Distance walked: 80 ft Assistive device utilized: Environmental consultant - 4 wheeled Level of assistance: Modified independence and SBA Gait pattern: step through pattern, decreased stride length, decreased hip/knee flexion- Right, decreased hip/knee flexion- Left, and trunk flexed Comments:  RAMP: Level of Assistance:  NT Assistive device utilized: NT Ramp Comments:   CURB:  Level of Assistance:  NT Assistive device  utilized:  NT Curb Comments:   STAIRS:  Level of Assistance: NT  Stair Negotiation Technique:   Number of Stairs:    Height of Stairs:   Comments:   FUNCTIONAL TESTS:  5 times sit to stand: 13.72 sec with B UE assist; able to complete single STS w/o UE assist but unable to maintain balance Timed up and go (TUG): 25.22 sec with rollator - 08/14/22 10 meter walk test: 17.44 sec with rollator; Gait speed = 1.88 ft sec with rollator - 08/14/22 Berg Balance Scale: 33/56; < 36 high risk for falls (close to 100%) Dynamic Gait Index: 13/24, Scores of 19 or less  are predictive of falls in older community living adults - 08/14/22  PATIENT SURVEYS:  ABC scale 520 / 1600 = 32.5 %   TODAY'S TREATMENT:   08/20/22 THERAPEUTIC EXERCISE: to improve flexibility, strength and mobility.  Demonstration, verbal and tactile cues throughout for technique.  NuStep - L4 x 6 min Seated thoracic lumbar extension with arms crossed on chest x 10 - pt elevated on Airex pad in chair with 6" step under feet to allow for better extension Seated scapular retraction with B shoulder extension/ER into towel roll along spine in back of chair x 10  Seated scapular retraction with B low angle YTB shoulder horizontal ABD into towel roll along spine in back of chair x 10 - R shld pain Standing trunk extension roll-up against wall x 10 Seated upper back extension press at edge of mat table 10 x 3"  Seated YTB scap retraction + row 10 x 3"  NEUROMUSCULAR RE-EDUCATION: To improve posture, balance, proprioception, coordination, and reduce fall risk. Corner balance progression with wide BOS on firm surface with arms at sides            Eyes open: (SBA of PT) - static stance x 30 sec - horiz head turns x 5 - vertical head nods x 5 - trunk rotation x 5 Eyes closed: - static stance x 10 sec (close SBA/CGA of PT)   08/15/22 Nustep L3x22min Gait 180 ft with rollator - cues for upright posture and for proximity with rollator; 180  additional feet with rollator Reviewed HEP Seated LAQ x 10  STS x 10  Seated horizontal ABD x 8 YTB- R shld pain Seated lunge into BOSU ball x 10 bil Seated ball squeeze x 10 Seated hip up/over x 10 TB   08/14/22 THERAPEUTIC ACTIVITIES: TUG: 25.22 sec with rollator 10 meter walk test: 17.44 sec with rollator Gait speed: 1.88 ft sec with rollator Berg: 33/56; < 36 high risk for falls (close to 100%) DGI: 13/24, Scores of 19 or less are predictive of falls in older community living adults  Transfer safety - education on locking rollator brakes and reaching back for chair before attempting to sit, as well as pushing up from seat rather than pulling up on rollator during STS  GAIT TRAINING: To normalize gait pattern and improve safety with rollator . 120 ft with rollator - cues for upright posture and improved rollator proximity, aiming for foot placement between rear wheels of rollator  THERAPEUTIC EXERCISE: to improve flexibility, strength and mobility.  Demonstration, verbal and tactile cues throughout for technique.  Seated thoracic lumbar extension with arms crossed on chest x 10 Seated scapular retraction with B shoulder extension/ER x 10  Seated RTB hip ABD clam x 10 Seated RTB hip flexion march  Seated RTB RTB alt ankle DF x 10 bil   PATIENT EDUCATION:  Education details: HEP review, HEP modification - alternative exercises for thoracic extension and scap retraction, transfer safety, and gait safety with rollator   Person educated: Patient Education method: Explanation, Demonstration, Verbal cues, and Handouts Education comprehension: verbalized understanding, returned demonstration, verbal cues required, and needs further education  HOME EXERCISE PROGRAM: Access Code: [email or web address] URL: [email or web address] Date: 08/20/2022 Prepared by: Glenetta Hew  Exercises - Seated Thoracic Lumbar Extension  - 1 x daily - 7 x weekly - 2 sets - 10 reps - 3 sec hold -  Seated Scapular Retraction with External Rotation  - 1 x daily - 7 x weekly - 2 sets - 10 reps -  3 sec hold - Seated Hip Abduction with Resistance  - 1 x daily - 7 x weekly - 2 sets - 10 reps - 3-5 sec hold - Seated March with Resistance  - 1 x daily - 7 x weekly - 2 sets - 10 reps - 3 sec hold - Seated Ankle Dorsiflexion with Resistance  - 1 x daily - 7 x weekly - 2 sets - 10 reps - 3 sec hold - Long Sitting Upper Back Extension Press  - 1 x daily - 7 x weekly - 2 sets - 10 reps - 3 sec hold - Seated Shoulder Row with Anchored Resistance  - 1 x daily - 7 x weekly - 2 sets - 10 reps - 3-5 hold hold   ASSESSMENT:  CLINICAL IMPRESSION: Markon "Ray" reports he continues to have issues with the postural exercises, therefore reviewed these again today offering suggestions for postioning and tactile cues to facilitate desired movement patterns as well as alternative exercises to achieve similar motion and muscle activation. He continues to require near constant cues for transfer safety with rollator. Initiated corner balance activities but limited by postural weakness. Ray will benefit from continued skilled PT to address above deficits to improve mobility and activity tolerance with decreased pain interference.   OBJECTIVE IMPAIRMENTS: Abnormal gait, decreased activity tolerance, decreased balance, decreased coordination, decreased knowledge of condition, decreased knowledge of use of DME, decreased mobility, difficulty walking, decreased ROM, decreased strength, decreased safety awareness, impaired perceived functional ability, impaired flexibility, impaired sensation, improper body mechanics, postural dysfunction, and pain.   ACTIVITY LIMITATIONS: carrying, lifting, bending, standing, squatting, stairs, transfers, bed mobility, locomotion level, and caring for others  PARTICIPATION LIMITATIONS: meal prep, cleaning, laundry, driving, community activity, and yard work  PERSONAL FACTORS: Age, Fitness,  Past/current experiences, Time since onset of injury/illness/exacerbation, and 3+ comorbidities: Paroxysmal A-fib, chronic DHF, heart murmur, generalized OA, RA, COPD/emphysema, HTN, HLD, CKD, R elbow/olecranon fracture 2019, RLS  are also affecting patient's functional outcome.   REHAB POTENTIAL: Good  CLINICAL DECISION MAKING: Evolving/moderate complexity  EVALUATION COMPLEXITY: Moderate   GOALS: Goals reviewed with patient? Yes  SHORT TERM GOALS: Target date: 09/05/2022  Patient will be independent with initial HEP to improve outcomes and carryover.  Baseline: TBD Goal status: IN PROGRESS  08/20/22 - Continued review/modification   2.  Patient will be educated on strategies to decrease risk of falls.  Baseline:  Goal status: IN PROGRESS  3.  Patient will demonstrate decreased TUG time to </= 20 sec to decrease risk for falls with transitional mobility. Baseline: 25.22 sec with rollator  Goal status: REVISED  LONG TERM GOALS: Target date: 10/03/2022  Patient will be independent with advanced/ongoing HEP to facilitate ability to maintain/progress functional gains from skilled physical therapy services. Baseline: Goal status: IN PROGRESS  2.  Patient will be able to step up/down curb safely with LRAD for safety with community ambulation and increased ease of access to sunken room in home.  Baseline:  Goal status: IN PROGRESS   3.  Patient will demonstrate improved functional LE strength as demonstrated by ability to independently achieve sit to stand without UE assist or loss of balance. Baseline: Requires B UE assist for majority of sit to stand; w/o UE assist, he requires increased momentum with LOB observed upon reaching standing Goal status: IN PROGRESS  4.  Patient will improve 5x STS time to </= 14.8 seconds w/o UE assist for improved efficiency and safety with transfers per age-matched norms. Baseline: 13.72 sec  with B UE assist; able to complete single STS w/o UE assist  but unable to maintain balance Goal status: IN PROGRESS   5.  Patient will demonstrate gait speed of >/= 2.62 ft/sec (0.8 m/s) to be a safe community ambulator with decreased risk for recurrent falls.  Baseline: 1.88 ft/sec with rollator Goal status: IN PROGRESS  6.  Patient will improve Berg score to >/= 41/56 to improve safety and stability with ADLs in standing and reduce risk for falls. (MCID= 8 points)  Baseline: 33/56  (43/56 as of DC from PT in 2019) Goal status: IN PROGRESS  7.  Patient will demonstrate at least 19/24 on DGI to improve gait stability and reduce risk for falls. Baseline: 13/24 on 08/14/22  (20/24 as of DC from PT in 2019) Goal status: IN PROGRESS  8  Patient will report >/= 50% on ABD scale to demonstrate improved functional ability. Baseline: 520 / 1600 = 32.5 % Goal status: IN PROGRESS   PLAN:  PT FREQUENCY: 1-2x/week, starting at 2x/wk, but reducing frequency to 1x/wk if too overwhelmed  PT DURATION: 8 weeks  PLANNED INTERVENTIONS: Therapeutic exercises, Therapeutic activity, Neuromuscular re-education, Balance training, Gait training, Patient/Family education, Self Care, Joint mobilization, Stair training, DME instructions, Dry Needling, Electrical stimulation, Cryotherapy, Moist heat, Taping, Ultrasound, Manual therapy, and Re-evaluation  PLAN FOR NEXT SESSION: Fall prevention education/home safety checklist with pt & wife; postural and core/LE strengthening; balance training, possibly integrating components of [place] fall prevention program   Marry Guan, PT 08/20/2022, 12:15 PM

## 2022-08-21 DIAGNOSIS — H6121 Impacted cerumen, right ear: Secondary | ICD-10-CM | POA: Diagnosis not present

## 2022-08-22 ENCOUNTER — Encounter: Payer: Self-pay | Admitting: Physical Therapy

## 2022-08-22 ENCOUNTER — Ambulatory Visit: Payer: Medicare Other | Admitting: Physical Therapy

## 2022-08-22 DIAGNOSIS — R2681 Unsteadiness on feet: Secondary | ICD-10-CM | POA: Diagnosis not present

## 2022-08-22 DIAGNOSIS — M6281 Muscle weakness (generalized): Secondary | ICD-10-CM

## 2022-08-22 DIAGNOSIS — R2689 Other abnormalities of gait and mobility: Secondary | ICD-10-CM | POA: Diagnosis not present

## 2022-08-22 DIAGNOSIS — R296 Repeated falls: Secondary | ICD-10-CM | POA: Diagnosis not present

## 2022-08-22 NOTE — Therapy (Signed)
OUTPATIENT PHYSICAL THERAPY TREATMENT   Patient Name: Cory Gonzalez MRN: [id number] DOB:[birth year], 89 y.o., male Today's Date: 08/22/2022   END OF SESSION:  PT End of Session - 08/22/22 0932     Visit Number 5    Date for PT Re-Evaluation 10/03/22    Authorization Type Medicare & BCBS    PT Start Time 0932    PT Stop Time 1015    PT Time Calculation (min) 43 min    Activity Tolerance Patient tolerated treatment well;Patient limited by fatigue    Behavior During Therapy WFL for tasks assessed/performed               Past Medical History:  Diagnosis Date   Chronic diastolic heart failure (HCC)    Dysrhythmia    A-fib   FUO (fever of unknown origin) 07/27/2019   Heart murmur    since childhood, never has had any problems   Hypertension    Paroxysmal atrial fibrillation (HCC)    Pneumonia    PONV (postoperative nausea and vomiting)    a little nausea   Rheumatoid arthritis (HCC)    Tick bite 07/27/2019   Past Surgical History:  Procedure Laterality Date   CATARACT EXTRACTION Bilateral    COLONOSCOPY     EXCISION MASS HEAD Left 03/29/2022   Procedure: EXCISION OF EAR MOH'S DEFECT;  Surgeon: Scarlette Ar, MD;  Location: [facility] OR;  Service: ENT;  Laterality: Left;   NASAL FLAP ROTATION Left 03/29/2022   Procedure: POSSIBLE FLAP ROTATION; POSSIBLE EAR CARTILAGE GRAFT;  Surgeon: Scarlette Ar, MD;  Location: [facility] OR;  Service: ENT;  Laterality: Left;   ORIF ELBOW FRACTURE Right 09/18/2017   Procedure: OPEN REDUCTION INTERNAL FIXATION (ORIF) RIGHT ELBOW/OLECRANON FRACTURE;  Surgeon: Eldred Manges, MD;  Location: [facility] OR;  Service: Orthopedics;  Laterality: Right;   PROSTATE SURGERY     SKIN FULL THICKNESS GRAFT Left 03/29/2022   Procedure: ADJACENT TISSUE TRANSFER;  SKIN GRAFT;  Surgeon: Scarlette Ar, MD;  Location: [facility] OR;  Service: ENT;  Laterality: Left;   Patient Active Problem List   Diagnosis Date Noted   FUO (fever of unknown origin) 07/27/2019   Tick bite 07/27/2019    Pneumothorax on right 09/19/2017   Olecranon fracture 09/18/2017   Blepharitis of upper and lower eyelids of both eyes 04/19/2016   Pseudophakia of both eyes 04/19/2016   TB lung, latent 06/05/2011   Ocular hypertension, 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   Rheumatoid arthritis (HCC) 02/16/2004    PCP: [facility]   REFERRING PROVIDER:  Georgianne Fick, MD   REFERRING DIAG: R26.9 (ICD-10-CM) - Gait abnormality    THERAPY DIAG:  Repeated falls  Unsteadiness on feet  Other abnormalities of gait and mobility  Muscle weakness (generalized)  RATIONALE FOR EVALUATION AND TREATMENT: Rehabilitation  ONSET DATE: Progressive decline over past few years  NEXT MD VISIT: 02/20/23   SUBJECTIVE:  SUBJECTIVE STATEMENT: Pt reports "1st exercise I do at home (thoracic extension stretch) makes my chest sore".  Pt accompanied by: significant other - wife - [name] (in waiting room)  PAIN: Are you having pain? No  PERTINENT HISTORY:  Paroxysmal A-fib, chronic DHF, heart murmur, generalized OA, RA, COPD/emphysema, HTN, HLD, CKD, R elbow/olecranon fracture 2019, RLS  PRECAUTIONS: Fall  WEIGHT BEARING RESTRICTIONS: No  FALLS:  Has patient fallen in last 6 months? Yes. Number of falls >6  LIVING ENVIRONMENT: Lives with: lives with their spouse Lives in: House/apartment Stairs: Yes: Internal: 1 steps; sunken room and External: 1 steps; ramp Has following equipment at home: Single point cane, Walker - 2 wheeled, Environmental consultant - 4 wheeled, Wheelchair (manual), Ramped entry, and transport chair  OCCUPATION: Retired  PLOF: Independent and Leisure: most sedentary  PATIENT GOALS: "To do better with my balance, my walking and getting up out of a chair."   OBJECTIVE: (objective measures  completed at initial evaluation unless otherwise dated)  DIAGNOSTIC FINDINGS:  03/13/22 - CT cervical spine s/p fall: IMPRESSION: 1. No evidence of acute fracture to the cervical spine. 2. Nonspecific straightening of the expected cervical lordosis. 3. Grade 1 anterolisthesis at C2-C3, C3-C4, C6-C7 and T1-T2. 4. Cervical spondylosis, as described.  03/13/22 Head CT s/p fall with trauma to the head and face: IMPRESSION:  No acute or traumatic finding. Age related volume loss and chronic small-vessel ischemic change of the white matter.  COGNITION: Overall cognitive status: Within functional limits for tasks assessed   SENSATION: B LE RLS  COORDINATION: Slowed reciprocal movements  MUSCLE LENGTH: (TBA as indicated) Hamstrings:  ITB:  Piriformis:  Hip flexors:  Quads:  Heelcord:   POSTURE:  rounded shoulders, forward head, decreased lumbar lordosis, increased thoracic kyphosis, flexed trunk , and significant kyphoscoliosis  LOWER EXTREMITY MMT:  (tested in sitting on eval)  MMT Right Eval Left Eval  Hip flexion 4- 4-  Hip extension 4 4-  Hip abduction 4+ 4  Hip adduction 5 4+  Hip internal rotation 4+ 4-  Hip external rotation 4- 4-  Knee flexion 4 4-  Knee extension 4+ 4  Ankle dorsiflexion 3+ 3+  Ankle plantarflexion    Ankle inversion    Ankle eversion    (Blank rows = not tested)  BED MOBILITY:  NT  TRANSFERS: Assistive device utilized: Environmental consultant - 4 wheeled  Sit to stand: SBA and CGA - CGA when performing without UE assist Stand to sit: SBA Chair to chair: SBA Floor:  NT  GAIT: Distance walked: 80 ft Assistive device utilized: Environmental consultant - 4 wheeled Level of assistance: Modified independence and SBA Gait pattern: step through pattern, decreased stride length, decreased hip/knee flexion- Right, decreased hip/knee flexion- Left, and trunk flexed Comments:  RAMP: Level of Assistance:  NT Assistive device utilized: NT Ramp Comments:   CURB:  Level of  Assistance:  NT Assistive device utilized:  NT Curb Comments:   STAIRS:  Level of Assistance: NT  Stair Negotiation Technique:   Number of Stairs:    Height of Stairs:   Comments:   FUNCTIONAL TESTS:  5 times sit to stand: 13.72 sec with B UE assist; able to complete single STS w/o UE assist but unable to maintain balance Timed up and go (TUG): 25.22 sec with rollator - 08/14/22 10 meter walk test: 17.44 sec with rollator; Gait speed = 1.88 ft sec with rollator - 08/14/22 Berg Balance Scale: 33/56; < 36 high risk for falls (close to 100%) Dynamic Gait  Index: 13/24, Scores of 19 or less are predictive of falls in older community living adults - 08/14/22  PATIENT SURVEYS:  ABC scale 520 / 1600 = 32.5 %   TODAY'S TREATMENT:   08/22/22 THERAPEUTIC EXERCISE: to improve flexibility, strength and mobility.  Demonstration, verbal and tactile cues throughout for technique.  NuStep - L4 x 6 min Seated upper back extension press at edge of mat table 10 x 3"  Seated YTB scap retraction + row 10 x 3", 2 sets - cues for scap retraction and to push chest fwd Seated YTB scap retraction + shoulder extension 10 x 3", 2 sets - cues for scap retraction and to push chest fwd Seated TrA symmetric + bilateral hand press into blue/green striped ball focusing on core activation with good upright posture 3 x 5" - discontinued due to complaint of increased chest pain Seated trunk extension + lifting blue/green striped ball OH x 10 Seated trunk extension + alt diagonal lifting blue/green striped  ball OH x 10 Seated trunk rotation while holding blue/green striped in front x 10 Seated GTB hip ABD clam x 10 Seated GTB hip flexion march x 10 Seated GTB alt ankle DF x 10 bil Standing alt hip ABD x 10 bil, UE support on rollator for balance Standing alt hip extension  x 10 bil, UE support on rollator for balance  THERAPEUTIC ACTIVITIES: Review of safe use of rollator during transfers (positioning of walker,  proper use of handbrakes, and proper hand placement during transfer) and good proximity to rollator during gait to decrease fall risk.   08/20/22 THERAPEUTIC EXERCISE: to improve flexibility, strength and mobility.  Demonstration, verbal and tactile cues throughout for technique.  NuStep - L4 x 6 min Seated thoracic lumbar extension with arms crossed on chest x 10 - pt elevated on Airex pad in chair with 6" step under feet to allow for better extension Seated scapular retraction with B shoulder extension/ER into towel roll along spine in back of chair x 10  Seated scapular retraction with B low angle YTB shoulder horizontal ABD into towel roll along spine in back of chair x 10 - R shld pain Standing trunk extension roll-up against wall x 10 Seated upper back extension press at edge of mat table 10 x 3"  Seated YTB scap retraction + row 10 x 3"  NEUROMUSCULAR RE-EDUCATION: To improve posture, balance, proprioception, coordination, and reduce fall risk. Corner balance progression with wide BOS on firm surface with arms at sides            Eyes open: (SBA of PT) - static stance x 30 sec - horiz head turns x 5 - vertical head nods x 5 - trunk rotation x 5 Eyes closed: - static stance x 10 sec (close SBA/CGA of PT)   08/15/22 Nustep L3x41min Gait 180 ft with rollator - cues for upright posture and for proximity with rollator; 180 additional feet with rollator Reviewed HEP Seated LAQ x 10  STS x 10  Seated horizontal ABD x 8 YTB- R shld pain Seated lunge into BOSU ball x 10 bil Seated ball squeeze x 10 Seated hip up/over x 10 TB   PATIENT EDUCATION:  Education details: HEP review, HEP progression - GTB provided for LE HEP, transfer safety, and gait safety with rollator   Person educated: Patient Education method: Explanation, Demonstration, and Verbal cues Education comprehension: verbalized understanding, returned demonstration, verbal cues required, and needs further  education  HOME EXERCISE PROGRAM: Access Code: [email or web address]  URL: [email or web address] Date: 08/20/2022 Prepared by: Glenetta Hew  Exercises - Seated Thoracic Lumbar Extension  - 1 x daily - 7 x weekly - 2 sets - 10 reps - 3 sec hold - Seated Scapular Retraction with External Rotation  - 1 x daily - 7 x weekly - 2 sets - 10 reps - 3 sec hold - Seated Hip Abduction with Resistance  - 1 x daily - 7 x weekly - 2 sets - 10 reps - 3-5 sec hold - Seated March with Resistance  - 1 x daily - 7 x weekly - 2 sets - 10 reps - 3 sec hold - Seated Ankle Dorsiflexion with Resistance  - 1 x daily - 7 x weekly - 2 sets - 10 reps - 3 sec hold - Long Sitting Upper Back Extension Press  - 1 x daily - 7 x weekly - 2 sets - 10 reps - 3 sec hold - Seated Shoulder Row with Anchored Resistance  - 1 x daily - 7 x weekly - 2 sets - 10 reps - 3-5 hold hold   ASSESSMENT:  CLINICAL IMPRESSION: Frederick "Ray" reports increased soreness in his chest following performance of thoracic extension stretch but when cues provided to reduce intensity of stretch, he states he has "been sore my whole life".  Still encouraged patient to avoid overstretching to the point of lingering soreness/pain and reinforced posterior/postural muscle activation/strengthening to help maintain improved upright posture and anterior flexibility.  He was able to advance resistance with seated HEP exercises to GTB, therefore GTB provided for home use.  Introduced standing hip strengthening using rollator for balance/UE support, however frequent cues necessary for postural alignment and coordination of movement patterns.  Ray will continue to benefit from skilled PT to address above deficits to improve mobility and activity tolerance with decreased risk for falls.   OBJECTIVE IMPAIRMENTS: Abnormal gait, decreased activity tolerance, decreased balance, decreased coordination, decreased knowledge of condition, decreased knowledge of use of DME,  decreased mobility, difficulty walking, decreased ROM, decreased strength, decreased safety awareness, impaired perceived functional ability, impaired flexibility, impaired sensation, improper body mechanics, postural dysfunction, and pain.   ACTIVITY LIMITATIONS: carrying, lifting, bending, standing, squatting, stairs, transfers, bed mobility, locomotion level, and caring for others  PARTICIPATION LIMITATIONS: meal prep, cleaning, laundry, driving, community activity, and yard work  PERSONAL FACTORS: Age, Fitness, Past/current experiences, Time since onset of injury/illness/exacerbation, and 3+ comorbidities: Paroxysmal A-fib, chronic DHF, heart murmur, generalized OA, RA, COPD/emphysema, HTN, HLD, CKD, R elbow/olecranon fracture 2019, RLS  are also affecting patient's functional outcome.   REHAB POTENTIAL: Good  CLINICAL DECISION MAKING: Evolving/moderate complexity  EVALUATION COMPLEXITY: Moderate   GOALS: Goals reviewed with patient? Yes  SHORT TERM GOALS: Target date: 09/05/2022  Patient will be independent with initial HEP to improve outcomes and carryover.  Baseline: TBD Goal status: MET  08/22/22    2.  Patient will be educated on strategies to decrease risk of falls.  Baseline:  Goal status: IN PROGRESS  3.  Patient will demonstrate decreased TUG time to </= 20 sec to decrease risk for falls with transitional mobility. Baseline: 25.22 sec with rollator  Goal status: IN PROGRESS  LONG TERM GOALS: Target date: 10/03/2022  Patient will be independent with advanced/ongoing HEP to facilitate ability to maintain/progress functional gains from skilled physical therapy services. Baseline: Goal status: IN PROGRESS  2.  Patient will be able to step up/down curb safely with LRAD for safety with community ambulation and increased ease of access to  sunken room in home.  Baseline:  Goal status: IN PROGRESS   3.  Patient will demonstrate improved functional LE strength as demonstrated  by ability to independently achieve sit to stand without UE assist or loss of balance. Baseline: Requires B UE assist for majority of sit to stand; w/o UE assist, he requires increased momentum with LOB observed upon reaching standing Goal status: IN PROGRESS  4.  Patient will improve 5x STS time to </= 14.8 seconds w/o UE assist for improved efficiency and safety with transfers per age-matched norms. Baseline: 13.72 sec with B UE assist; able to complete single STS w/o UE assist but unable to maintain balance Goal status: IN PROGRESS   5.  Patient will demonstrate gait speed of >/= 2.62 ft/sec (0.8 m/s) to be a safe community ambulator with decreased risk for recurrent falls.  Baseline: 1.88 ft/sec with rollator Goal status: IN PROGRESS  6.  Patient will improve Berg score to >/= 41/56 to improve safety and stability with ADLs in standing and reduce risk for falls. (MCID= 8 points)  Baseline: 33/56  (43/56 as of DC from PT in 2019) Goal status: IN PROGRESS  7.  Patient will demonstrate at least 19/24 on DGI to improve gait stability and reduce risk for falls. Baseline: 13/24 on 08/14/22  (20/24 as of DC from PT in 2019) Goal status: IN PROGRESS  8  Patient will report >/= 50% on ABD scale to demonstrate improved functional ability. Baseline: 520 / 1600 = 32.5 % Goal status: IN PROGRESS   PLAN:  PT FREQUENCY: 1-2x/week, starting at 2x/wk, but reducing frequency to 1x/wk if too overwhelmed  PT DURATION: 8 weeks  PLANNED INTERVENTIONS: Therapeutic exercises, Therapeutic activity, Neuromuscular re-education, Balance training, Gait training, Patient/Family education, Self Care, Joint mobilization, Stair training, DME instructions, Dry Needling, Electrical stimulation, Cryotherapy, Moist heat, Taping, Ultrasound, Manual therapy, and Re-evaluation  PLAN FOR NEXT SESSION: Fall prevention education/home safety checklist with pt & wife; postural and core/LE strengthening working towards more  standing exercises/activities; balance training, possibly integrating components of [place] fall prevention program   Marry Guan, PT 08/22/2022, 10:43 AM

## 2022-08-28 ENCOUNTER — Encounter: Payer: Self-pay | Admitting: Physical Therapy

## 2022-08-28 ENCOUNTER — Ambulatory Visit: Payer: Medicare Other | Admitting: Physical Therapy

## 2022-08-28 DIAGNOSIS — R2689 Other abnormalities of gait and mobility: Secondary | ICD-10-CM | POA: Diagnosis not present

## 2022-08-28 DIAGNOSIS — R296 Repeated falls: Secondary | ICD-10-CM

## 2022-08-28 DIAGNOSIS — M6281 Muscle weakness (generalized): Secondary | ICD-10-CM

## 2022-08-28 DIAGNOSIS — R2681 Unsteadiness on feet: Secondary | ICD-10-CM

## 2022-08-28 NOTE — Therapy (Signed)
OUTPATIENT PHYSICAL THERAPY TREATMENT   Patient Name: Cory Gonzalez MRN: [id number] DOB:[birth year], 89 y.o., male Today's Date: 08/28/2022   END OF SESSION:  PT End of Session - 08/28/22 0938     Visit Number 6    Date for PT Re-Evaluation 10/03/22    Authorization Type Medicare & BCBS    PT Start Time 0932    PT Stop Time 1011    PT Time Calculation (min) 39 min    Activity Tolerance Patient tolerated treatment well;Patient limited by fatigue    Behavior During Therapy WFL for tasks assessed/performed                Past Medical History:  Diagnosis Date   Chronic diastolic heart failure (HCC)    Dysrhythmia    A-fib   FUO (fever of unknown origin) 07/27/2019   Heart murmur    since childhood, never has had any problems   Hypertension    Paroxysmal atrial fibrillation (HCC)    Pneumonia    PONV (postoperative nausea and vomiting)    a little nausea   Rheumatoid arthritis (HCC)    Tick bite 07/27/2019   Past Surgical History:  Procedure Laterality Date   CATARACT EXTRACTION Bilateral    COLONOSCOPY     EXCISION MASS HEAD Left 03/29/2022   Procedure: EXCISION OF EAR MOH'S DEFECT;  Surgeon: Scarlette Ar, MD;  Location: [facility] OR;  Service: ENT;  Laterality: Left;   NASAL FLAP ROTATION Left 03/29/2022   Procedure: POSSIBLE FLAP ROTATION; POSSIBLE EAR CARTILAGE GRAFT;  Surgeon: Scarlette Ar, MD;  Location: [facility] OR;  Service: ENT;  Laterality: Left;   ORIF ELBOW FRACTURE Right 09/18/2017   Procedure: OPEN REDUCTION INTERNAL FIXATION (ORIF) RIGHT ELBOW/OLECRANON FRACTURE;  Surgeon: Eldred Manges, MD;  Location: [facility] OR;  Service: Orthopedics;  Laterality: Right;   PROSTATE SURGERY     SKIN FULL THICKNESS GRAFT Left 03/29/2022   Procedure: ADJACENT TISSUE TRANSFER;  SKIN GRAFT;  Surgeon: Scarlette Ar, MD;  Location: [facility];  Service: ENT;  Laterality: Left;   Patient Active Problem List   Diagnosis Date Noted   FUO (fever of unknown origin) 07/27/2019   Tick bite  07/27/2019   Pneumothorax on right 09/19/2017   Olecranon fracture 09/18/2017   Blepharitis of upper and lower eyelids of both eyes 04/19/2016   Pseudophakia of both eyes 04/19/2016   TB lung, latent 06/05/2011   Ocular hypertension, 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   Rheumatoid arthritis (HCC) 02/16/2004    PCP: [facility]   REFERRING PROVIDER:  Georgianne Fick, MD   REFERRING DIAG: R26.9 (ICD-10-CM) - Gait abnormality    THERAPY DIAG:  Repeated falls  Unsteadiness on feet  Other abnormalities of gait and mobility  Muscle weakness (generalized)  RATIONALE FOR EVALUATION AND TREATMENT: Rehabilitation  ONSET DATE: Progressive decline over past few years  NEXT MD VISIT: 02/20/23   SUBJECTIVE:  SUBJECTIVE STATEMENT:  I'm tired today, no reason really, I'm 89 years old.   Pt accompanied by: significant other - wife - [name] (in waiting room)  PAIN: Are you having pain? No 0/10  PERTINENT HISTORY:  Paroxysmal A-fib, chronic DHF, heart murmur, generalized OA, RA, COPD/emphysema, HTN, HLD, CKD, R elbow/olecranon fracture 2019, RLS  PRECAUTIONS: Fall  WEIGHT BEARING RESTRICTIONS: No  FALLS:  Has patient fallen in last 6 months? Yes. Number of falls >6  LIVING ENVIRONMENT: Lives with: lives with their spouse Lives in: House/apartment Stairs: Yes: Internal: 1 steps; sunken room and External: 1 steps; ramp Has following equipment at home: Single point cane, Walker - 2 wheeled, Environmental consultant - 4 wheeled, Wheelchair (manual), Ramped entry, and transport chair  OCCUPATION: Retired  PLOF: Independent and Leisure: most sedentary  PATIENT GOALS: "To do better with my balance, my walking and getting up out of a chair."   OBJECTIVE: (objective measures completed at  initial evaluation unless otherwise dated)  DIAGNOSTIC FINDINGS:  03/13/22 - CT cervical spine s/p fall: IMPRESSION: 1. No evidence of acute fracture to the cervical spine. 2. Nonspecific straightening of the expected cervical lordosis. 3. Grade 1 anterolisthesis at C2-C3, C3-C4, C6-C7 and T1-T2. 4. Cervical spondylosis, as described.  03/13/22 Head CT s/p fall with trauma to the head and face: IMPRESSION:  No acute or traumatic finding. Age related volume loss and chronic small-vessel ischemic change of the white matter.  COGNITION: Overall cognitive status: Within functional limits for tasks assessed   SENSATION: B LE RLS  COORDINATION: Slowed reciprocal movements  MUSCLE LENGTH: (TBA as indicated) Hamstrings:  ITB:  Piriformis:  Hip flexors:  Quads:  Heelcord:   POSTURE:  rounded shoulders, forward head, decreased lumbar lordosis, increased thoracic kyphosis, flexed trunk , and significant kyphoscoliosis  LOWER EXTREMITY MMT:  (tested in sitting on eval)  MMT Right Eval Left Eval  Hip flexion 4- 4-  Hip extension 4 4-  Hip abduction 4+ 4  Hip adduction 5 4+  Hip internal rotation 4+ 4-  Hip external rotation 4- 4-  Knee flexion 4 4-  Knee extension 4+ 4  Ankle dorsiflexion 3+ 3+  Ankle plantarflexion    Ankle inversion    Ankle eversion    (Blank rows = not tested)  BED MOBILITY:  NT  TRANSFERS: Assistive device utilized: Environmental consultant - 4 wheeled  Sit to stand: SBA and CGA - CGA when performing without UE assist Stand to sit: SBA Chair to chair: SBA Floor:  NT  GAIT: Distance walked: 80 ft Assistive device utilized: Environmental consultant - 4 wheeled Level of assistance: Modified independence and SBA Gait pattern: step through pattern, decreased stride length, decreased hip/knee flexion- Right, decreased hip/knee flexion- Left, and trunk flexed Comments:  RAMP: Level of Assistance:  NT Assistive device utilized: NT Ramp Comments:   CURB:  Level of Assistance:   NT Assistive device utilized:  NT Curb Comments:   STAIRS:  Level of Assistance: NT  Stair Negotiation Technique:   Number of Stairs:    Height of Stairs:   Comments:   FUNCTIONAL TESTS:  5 times sit to stand: 13.72 sec with B UE assist; able to complete single STS w/o UE assist but unable to maintain balance Timed up and go (TUG): 25.22 sec with rollator - 08/14/22 10 meter walk test: 17.44 sec with rollator; Gait speed = 1.88 ft sec with rollator - 08/14/22 Berg Balance Scale: 33/56; < 36 high risk for falls (close to 100%) Dynamic Gait Index: 13/24,  Scores of 19 or less are predictive of falls in older community living adults - 08/14/22  PATIENT SURVEYS:  ABC scale 520 / 1600 = 32.5 %   TODAY'S TREATMENT:    08/28/22   TherEx  Nustep L5x6 minutes BLEs/BUEs  Seated marches green TB above knees x15 B Seated clams green TB above knees x15  Seated LAQs green TB at ankles x10 B STS on blue foam pad x10 Min-ModA for boost from low table, cues for eccentric lower  Seated heel-toe raises x20    NMR  Narrow BOS on blue foam pad EO x90 seconds MinA WNL BOS on blue foam pad EC x60 seconds Min-ModA  Semi-tandem stance solid surface 2x60 seconds B up to MinA     08/22/22 THERAPEUTIC EXERCISE: to improve flexibility, strength and mobility.  Demonstration, verbal and tactile cues throughout for technique.  NuStep - L4 x 6 min Seated upper back extension press at edge of mat table 10 x 3"  Seated YTB scap retraction + row 10 x 3", 2 sets - cues for scap retraction and to push chest fwd Seated YTB scap retraction + shoulder extension 10 x 3", 2 sets - cues for scap retraction and to push chest fwd Seated TrA symmetric + bilateral hand press into blue/green striped ball focusing on core activation with good upright posture 3 x 5" - discontinued due to complaint of increased chest pain Seated trunk extension + lifting blue/green striped ball OH x 10 Seated trunk extension + alt  diagonal lifting blue/green striped  ball OH x 10 Seated trunk rotation while holding blue/green striped in front x 10 Seated GTB hip ABD clam x 10 Seated GTB hip flexion march x 10 Seated GTB alt ankle DF x 10 bil Standing alt hip ABD x 10 bil, UE support on rollator for balance Standing alt hip extension  x 10 bil, UE support on rollator for balance  THERAPEUTIC ACTIVITIES: Review of safe use of rollator during transfers (positioning of walker, proper use of handbrakes, and proper hand placement during transfer) and good proximity to rollator during gait to decrease fall risk.   08/20/22 THERAPEUTIC EXERCISE: to improve flexibility, strength and mobility.  Demonstration, verbal and tactile cues throughout for technique.  NuStep - L4 x 6 min Seated thoracic lumbar extension with arms crossed on chest x 10 - pt elevated on Airex pad in chair with 6" step under feet to allow for better extension Seated scapular retraction with B shoulder extension/ER into towel roll along spine in back of chair x 10  Seated scapular retraction with B low angle YTB shoulder horizontal ABD into towel roll along spine in back of chair x 10 - R shld pain Standing trunk extension roll-up against wall x 10 Seated upper back extension press at edge of mat table 10 x 3"  Seated YTB scap retraction + row 10 x 3"  NEUROMUSCULAR RE-EDUCATION: To improve posture, balance, proprioception, coordination, and reduce fall risk. Corner balance progression with wide BOS on firm surface with arms at sides            Eyes open: (SBA of PT) - static stance x 30 sec - horiz head turns x 5 - vertical head nods x 5 - trunk rotation x 5 Eyes closed: - static stance x 10 sec (close SBA/CGA of PT)   08/15/22 Nustep L3x55min Gait 180 ft with rollator - cues for upright posture and for proximity with rollator; 180 additional feet with rollator Reviewed HEP  Seated LAQ x 10  STS x 10  Seated horizontal ABD x 8 YTB- R shld  pain Seated lunge into BOSU ball x 10 bil Seated ball squeeze x 10 Seated hip up/over x 10 TB   PATIENT EDUCATION:  Education details: HEP review, HEP progression - GTB provided for LE HEP, transfer safety, and gait safety with rollator   Person educated: Patient Education method: Explanation, Demonstration, and Verbal cues Education comprehension: verbalized understanding, returned demonstration, verbal cues required, and needs further education  HOME EXERCISE PROGRAM: Access Code: [email or web address] URL: [email or web address] Date: 08/20/2022 Prepared by: Glenetta Hew  Exercises - Seated Thoracic Lumbar Extension  - 1 x daily - 7 x weekly - 2 sets - 10 reps - 3 sec hold - Seated Scapular Retraction with External Rotation  - 1 x daily - 7 x weekly - 2 sets - 10 reps - 3 sec hold - Seated Hip Abduction with Resistance  - 1 x daily - 7 x weekly - 2 sets - 10 reps - 3-5 sec hold - Seated March with Resistance  - 1 x daily - 7 x weekly - 2 sets - 10 reps - 3 sec hold - Seated Ankle Dorsiflexion with Resistance  - 1 x daily - 7 x weekly - 2 sets - 10 reps - 3 sec hold - Long Sitting Upper Back Extension Press  - 1 x daily - 7 x weekly - 2 sets - 10 reps - 3 sec hold - Seated Shoulder Row with Anchored Resistance  - 1 x daily - 7 x weekly - 2 sets - 10 reps - 3-5 hold hold   ASSESSMENT:  CLINICAL IMPRESSION:  Ray arrives today feeling OK, just tired. Continued working on general strengthening, balance training, and fall prevention/education. Tolerated session well, seems to be making general progress with PT. Very easily fatigued today with poor functional activity tolerance. Really needed a lot of cues for upright posture, tends to hunch forward with balance exercises. Will continue to progress as able.   OBJECTIVE IMPAIRMENTS: Abnormal gait, decreased activity tolerance, decreased balance, decreased coordination, decreased knowledge of condition, decreased knowledge of use of DME,  decreased mobility, difficulty walking, decreased ROM, decreased strength, decreased safety awareness, impaired perceived functional ability, impaired flexibility, impaired sensation, improper body mechanics, postural dysfunction, and pain.   ACTIVITY LIMITATIONS: carrying, lifting, bending, standing, squatting, stairs, transfers, bed mobility, locomotion level, and caring for others  PARTICIPATION LIMITATIONS: meal prep, cleaning, laundry, driving, community activity, and yard work  PERSONAL FACTORS: Age, Fitness, Past/current experiences, Time since onset of injury/illness/exacerbation, and 3+ comorbidities: Paroxysmal A-fib, chronic DHF, heart murmur, generalized OA, RA, COPD/emphysema, HTN, HLD, CKD, R elbow/olecranon fracture 2019, RLS  are also affecting patient's functional outcome.   REHAB POTENTIAL: Good  CLINICAL DECISION MAKING: Evolving/moderate complexity  EVALUATION COMPLEXITY: Moderate   GOALS: Goals reviewed with patient? Yes  SHORT TERM GOALS: Target date: 09/05/2022  Patient will be independent with initial HEP to improve outcomes and carryover.  Baseline: TBD Goal status: MET  08/22/22    2.  Patient will be educated on strategies to decrease risk of falls.  Baseline:  Goal status: IN PROGRESS  3.  Patient will demonstrate decreased TUG time to </= 20 sec to decrease risk for falls with transitional mobility. Baseline: 25.22 sec with rollator  Goal status: IN PROGRESS  LONG TERM GOALS: Target date: 10/03/2022  Patient will be independent with advanced/ongoing HEP to facilitate ability to maintain/progress functional gains from skilled physical therapy services.  Baseline: Goal status: IN PROGRESS  2.  Patient will be able to step up/down curb safely with LRAD for safety with community ambulation and increased ease of access to sunken room in home.  Baseline:  Goal status: IN PROGRESS   3.  Patient will demonstrate improved functional LE strength as demonstrated  by ability to independently achieve sit to stand without UE assist or loss of balance. Baseline: Requires B UE assist for majority of sit to stand; w/o UE assist, he requires increased momentum with LOB observed upon reaching standing Goal status: IN PROGRESS  4.  Patient will improve 5x STS time to </= 14.8 seconds w/o UE assist for improved efficiency and safety with transfers per age-matched norms. Baseline: 13.72 sec with B UE assist; able to complete single STS w/o UE assist but unable to maintain balance Goal status: IN PROGRESS   5.  Patient will demonstrate gait speed of >/= 2.62 ft/sec (0.8 m/s) to be a safe community ambulator with decreased risk for recurrent falls.  Baseline: 1.88 ft/sec with rollator Goal status: IN PROGRESS  6.  Patient will improve Berg score to >/= 41/56 to improve safety and stability with ADLs in standing and reduce risk for falls. (MCID= 8 points)  Baseline: 33/56  (43/56 as of DC from PT in 2019) Goal status: IN PROGRESS  7.  Patient will demonstrate at least 19/24 on DGI to improve gait stability and reduce risk for falls. Baseline: 13/24 on 08/14/22  (20/24 as of DC from PT in 2019) Goal status: IN PROGRESS  8  Patient will report >/= 50% on ABD scale to demonstrate improved functional ability. Baseline: 520 / 1600 = 32.5 % Goal status: IN PROGRESS   PLAN:  PT FREQUENCY: 1-2x/week, starting at 2x/wk, but reducing frequency to 1x/wk if too overwhelmed  PT DURATION: 8 weeks  PLANNED INTERVENTIONS: Therapeutic exercises, Therapeutic activity, Neuromuscular re-education, Balance training, Gait training, Patient/Family education, Self Care, Joint mobilization, Stair training, DME instructions, Dry Needling, Electrical stimulation, Cryotherapy, Moist heat, Taping, Ultrasound, Manual therapy, and Re-evaluation  PLAN FOR NEXT SESSION: Fall prevention education/home safety checklist with pt & wife; postural and core/LE strengthening working towards more  standing exercises/activities; balance training, possibly integrating components of [place] fall prevention program  Nedra Hai, PT, DPT 08/28/22 10:12 AM

## 2022-09-04 ENCOUNTER — Ambulatory Visit: Payer: Medicare Other | Attending: Internal Medicine | Admitting: Physical Therapy

## 2022-09-04 ENCOUNTER — Encounter: Payer: Self-pay | Admitting: Physical Therapy

## 2022-09-04 DIAGNOSIS — R296 Repeated falls: Secondary | ICD-10-CM | POA: Diagnosis not present

## 2022-09-04 DIAGNOSIS — R2681 Unsteadiness on feet: Secondary | ICD-10-CM | POA: Insufficient documentation

## 2022-09-04 DIAGNOSIS — R2689 Other abnormalities of gait and mobility: Secondary | ICD-10-CM | POA: Diagnosis not present

## 2022-09-04 DIAGNOSIS — M6281 Muscle weakness (generalized): Secondary | ICD-10-CM | POA: Insufficient documentation

## 2022-09-04 NOTE — Therapy (Signed)
OUTPATIENT PHYSICAL THERAPY TREATMENT   Patient Name: Cory Gonzalez MRN: [id number] DOB:Dec 03, 1929, 89 y.o., male Today's Date: 09/04/2022   END OF SESSION:  PT End of Session - 09/04/22 0931     Visit Number 7    Date for PT Re-Evaluation 10/03/22    Authorization Type Medicare & BCBS    PT Start Time [phone]    PT Stop Time 1015    PT Time Calculation (min) 44 min    Activity Tolerance Patient tolerated treatment well    Behavior During Therapy WFL for tasks assessed/performed                Past Medical History:  Diagnosis Date   Chronic diastolic heart failure (HCC)    Dysrhythmia    A-fib   FUO (fever of unknown origin) 07/27/2019   Heart murmur    since childhood, never has had any problems   Hypertension    Paroxysmal atrial fibrillation (HCC)    Pneumonia    PONV (postoperative nausea and vomiting)    a little nausea   Rheumatoid arthritis (HCC)    Tick bite 07/27/2019   Past Surgical History:  Procedure Laterality Date   CATARACT EXTRACTION Bilateral    COLONOSCOPY     EXCISION MASS HEAD Left 03/29/2022   Procedure: EXCISION OF EAR MOH'S DEFECT;  Surgeon: Scarlette Ar, MD;  Location: [facility] OR;  Service: ENT;  Laterality: Left;   NASAL FLAP ROTATION Left 03/29/2022   Procedure: POSSIBLE FLAP ROTATION; POSSIBLE EAR CARTILAGE GRAFT;  Surgeon: Scarlette Ar, MD;  Location: [facility] OR;  Service: ENT;  Laterality: Left;   ORIF ELBOW FRACTURE Right 09/18/2017   Procedure: OPEN REDUCTION INTERNAL FIXATION (ORIF) RIGHT ELBOW/OLECRANON FRACTURE;  Surgeon: Eldred Manges, MD;  Location: [facility] OR;  Service: Orthopedics;  Laterality: Right;   PROSTATE SURGERY     SKIN FULL THICKNESS GRAFT Left 03/29/2022   Procedure: ADJACENT TISSUE TRANSFER;  SKIN GRAFT;  Surgeon: Scarlette Ar, MD;  Location: [facility] OR;  Service: ENT;  Laterality: Left;   Patient Active Problem List   Diagnosis Date Noted   FUO (fever of unknown origin) 07/27/2019   Tick bite 07/27/2019   Pneumothorax on right  09/19/2017   Olecranon fracture 09/18/2017   Blepharitis of upper and lower eyelids of both eyes 04/19/2016   Pseudophakia of both eyes 04/19/2016   TB lung, latent 06/05/2011   Ocular hypertension, 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   Rheumatoid arthritis (HCC) 02/16/2004    PCP: [facility]   REFERRING PROVIDER:  Georgianne Fick, MD   REFERRING DIAG: R26.9 (ICD-10-CM) - Gait abnormality    THERAPY DIAG:  Repeated falls  Unsteadiness on feet  Other abnormalities of gait and mobility  Muscle weakness (generalized)  RATIONALE FOR EVALUATION AND TREATMENT: Rehabilitation  ONSET DATE: Progressive decline over past few years  NEXT MD VISIT: 02/20/23   SUBJECTIVE:  SUBJECTIVE STATEMENT: "I don't hurt much, but things just don't want to work."  Pt accompanied by: significant other - wife - [name] (in waiting room)  PAIN: Are you having pain? No   PERTINENT HISTORY:  Paroxysmal A-fib, chronic DHF, heart murmur, generalized OA, RA, COPD/emphysema, HTN, HLD, CKD, R elbow/olecranon fracture 2019, RLS  PRECAUTIONS: Fall  WEIGHT BEARING RESTRICTIONS: No  FALLS:  Has patient fallen in last 6 months? Yes. Number of falls >6  LIVING ENVIRONMENT: Lives with: lives with their spouse Lives in: House/apartment Stairs: Yes: Internal: 1 steps; sunken room and External: 1 steps; ramp Has following equipment at home: Single point cane, Walker - 2 wheeled, Environmental consultant - 4 wheeled, Wheelchair (manual), Ramped entry, and transport chair  OCCUPATION: Retired  PLOF: Independent and Leisure: most sedentary  PATIENT GOALS: "To do better with my balance, my walking and getting up out of a chair."   OBJECTIVE: (objective measures completed at initial evaluation unless otherwise  dated)  DIAGNOSTIC FINDINGS:  03/13/22 - CT cervical spine s/p fall: IMPRESSION: 1. No evidence of acute fracture to the cervical spine. 2. Nonspecific straightening of the expected cervical lordosis. 3. Grade 1 anterolisthesis at C2-C3, C3-C4, C6-C7 and T1-T2. 4. Cervical spondylosis, as described.  03/13/22 Head CT s/p fall with trauma to the head and face: IMPRESSION:  No acute or traumatic finding. Age related volume loss and chronic small-vessel ischemic change of the white matter.  COGNITION: Overall cognitive status: Within functional limits for tasks assessed   SENSATION: B LE RLS  COORDINATION: Slowed reciprocal movements  MUSCLE LENGTH: (TBA as indicated) Hamstrings:  ITB:  Piriformis:  Hip flexors:  Quads:  Heelcord:   POSTURE:  rounded shoulders, forward head, decreased lumbar lordosis, increased thoracic kyphosis, flexed trunk , and significant kyphoscoliosis  LOWER EXTREMITY MMT:  (tested in sitting on eval and 09/04/22)  MMT Right Eval Left Eval R 09/04/22 L 09/04/22  Hip flexion 4- 4- 4- 4-  Hip extension 4 4- 4 4-  Hip abduction 4+ 4 4+ 4+  Hip adduction 5 4+ 5 5  Hip internal rotation 4+ 4- 4+ 4+  Hip external rotation 4- 4- 4 4  Knee flexion 4 4- 4+ 4+  Knee extension 4+ 4 4+ 4+  Ankle dorsiflexion 3+ 3+ 4- 4  Ankle plantarflexion      Ankle inversion      Ankle eversion      (Blank rows = not tested)  BED MOBILITY:  NT  TRANSFERS: Assistive device utilized: Environmental consultant - 4 wheeled  Sit to stand: SBA and CGA - CGA when performing without UE assist Stand to sit: SBA Chair to chair: SBA Floor:  NT  GAIT: Distance walked: 80 ft Assistive device utilized: Environmental consultant - 4 wheeled Level of assistance: Modified independence and SBA Gait pattern: step through pattern, decreased stride length, decreased hip/knee flexion- Right, decreased hip/knee flexion- Left, and trunk flexed Comments:  RAMP: Level of Assistance:  NT Assistive device utilized: NT Ramp  Comments:   CURB:  Level of Assistance:  NT Assistive device utilized:  NT Curb Comments:   STAIRS:  Level of Assistance: NT  Stair Negotiation Technique:   Number of Stairs:    Height of Stairs:   Comments:   FUNCTIONAL TESTS:  5 times sit to stand: 13.72 sec with B UE assist; able to complete single STS w/o UE assist but unable to maintain balance Timed up and go (TUG): 25.22 sec with rollator - 08/14/22 10 meter walk test: 17.44 sec  with rollator; Gait speed = 1.88 ft sec with rollator - 08/14/22 Berg Balance Scale: 33/56; < 36 high risk for falls (close to 100%) Dynamic Gait Index: 13/24, Scores of 19 or less are predictive of falls in older community living adults - 08/14/22  09/04/22 Berg: 40/56; 37-45 significant risk for fall (>80%)   PATIENT SURVEYS:  ABC scale 520 / 1600 = 32.5 %   TODAY'S TREATMENT:   09/04/22 THERAPEUTIC EXERCISE: to improve flexibility, strength and mobility.  Demonstration, verbal and tactile cues throughout for technique.  NuStep - L5 x 6 min Standing hip/trunk extension with buttocks leaning against mat table x 10 Standing lumbar extension with forearms on wall x 10  THERAPEUTIC ACTIVITIES:  LE MMT Berg: 40/56  NEUROMUSCULAR RE-EDUCATION: To improve posture, balance, coordination, and reduce fall risk. Corner balance progression with 3/4 tandem stance, staggered stance, right SLS, and left SLS on firm surface with intermittent UE support on wall or back of chairs            Eyes open: - static stance x 30 sec Alt toe clears to 9" step x 10 with B UE support of back of chair in corner Alt toe clears to 9" step x 10 with single UE support of back of chair in corner Corner balance progression with  R/L foot on 9" step  on firm surface with  B UE support progressing to single UE support on back of chair            Eyes open: - static stance x 30 sec   08/28/22 TherEx Nustep L5x6 minutes BLEs/BUEs  Seated marches green TB above knees x15  B Seated clams green TB above knees x15  Seated LAQs green TB at ankles x10 B STS on blue foam pad x10 Min-ModA for boost from low table, cues for eccentric lower  Seated heel-toe raises x20   NMR Narrow BOS on blue foam pad EO x90 seconds MinA WNL BOS on blue foam pad EC x60 seconds Min-ModA  Semi-tandem stance solid surface 2x60 seconds B up to MinA     08/22/22 THERAPEUTIC EXERCISE: to improve flexibility, strength and mobility.  Demonstration, verbal and tactile cues throughout for technique.  NuStep - L4 x 6 min Seated upper back extension press at edge of mat table 10 x 3"  Seated YTB scap retraction + row 10 x 3", 2 sets - cues for scap retraction and to push chest fwd Seated YTB scap retraction + shoulder extension 10 x 3", 2 sets - cues for scap retraction and to push chest fwd Seated TrA symmetric + bilateral hand press into blue/green striped ball focusing on core activation with good upright posture 3 x 5" - discontinued due to complaint of increased chest pain Seated trunk extension + lifting blue/green striped ball OH x 10 Seated trunk extension + alt diagonal lifting blue/green striped  ball OH x 10 Seated trunk rotation while holding blue/green striped in front x 10 Seated GTB hip ABD clam x 10 Seated GTB hip flexion march x 10 Seated GTB alt ankle DF x 10 bil Standing alt hip ABD x 10 bil, UE support on rollator for balance Standing alt hip extension  x 10 bil, UE support on rollator for balance  THERAPEUTIC ACTIVITIES: Review of safe use of rollator during transfers (positioning of walker, proper use of handbrakes, and proper hand placement during transfer) and good proximity to rollator during gait to decrease fall risk.   PATIENT EDUCATION:  Education  details: progress with PT, ongoing PT POC, transfer safety, and gait safety with rollator   Person educated: Patient Education method: Explanation, Demonstration, and Verbal cues Education comprehension:  verbalized understanding, returned demonstration, verbal cues required, and needs further education  HOME EXERCISE PROGRAM: Access Code: [email or web address] URL: [email or web address] Date: 08/20/2022 Prepared by: Glenetta Hew  Exercises - Seated Thoracic Lumbar Extension  - 1 x daily - 7 x weekly - 2 sets - 10 reps - 3 sec hold - Seated Scapular Retraction with External Rotation  - 1 x daily - 7 x weekly - 2 sets - 10 reps - 3 sec hold - Seated Hip Abduction with Resistance  - 1 x daily - 7 x weekly - 2 sets - 10 reps - 3-5 sec hold - Seated March with Resistance  - 1 x daily - 7 x weekly - 2 sets - 10 reps - 3 sec hold - Seated Ankle Dorsiflexion with Resistance  - 1 x daily - 7 x weekly - 2 sets - 10 reps - 3 sec hold - Long Sitting Upper Back Extension Press  - 1 x daily - 7 x weekly - 2 sets - 10 reps - 3 sec hold - Seated Shoulder Row with Anchored Resistance  - 1 x daily - 7 x weekly - 2 sets - 10 reps - 3-5 hold hold   ASSESSMENT:  CLINICAL IMPRESSION: Cory "Ray" is demonstrating good progress with PT.  B LE strength improving mild proximal weakness still evident.  Standardized balance testing with Berg balance scale demonstrating a 7 point gain from 33/56 to 40/56, indicating improvement in his fall risk, high fall risk (close to 100%) to a significant risk for falls (>80%).  He continues to demonstrate a very flexed posture during standing activities therefore worked on trunk and hip extension ROM and hip flexor stretches to promote improved upright posture and alignment.  Ray will benefit from continued skilled PT to address ongoing strength, posture and balance deficits to improve mobility and activity tolerance with decreased risk for falls.   OBJECTIVE IMPAIRMENTS: Abnormal gait, decreased activity tolerance, decreased balance, decreased coordination, decreased knowledge of condition, decreased knowledge of use of DME, decreased mobility, difficulty walking, decreased ROM,  decreased strength, decreased safety awareness, impaired perceived functional ability, impaired flexibility, impaired sensation, improper body mechanics, postural dysfunction, and pain.   ACTIVITY LIMITATIONS: carrying, lifting, bending, standing, squatting, stairs, transfers, bed mobility, locomotion level, and caring for others  PARTICIPATION LIMITATIONS: meal prep, cleaning, laundry, driving, community activity, and yard work  PERSONAL FACTORS: Age, Fitness, Past/current experiences, Time since onset of injury/illness/exacerbation, and 3+ comorbidities: Paroxysmal A-fib, chronic DHF, heart murmur, generalized OA, RA, COPD/emphysema, HTN, HLD, CKD, R elbow/olecranon fracture 2019, RLS  are also affecting patient's functional outcome.   REHAB POTENTIAL: Good  CLINICAL DECISION MAKING: Evolving/moderate complexity  EVALUATION COMPLEXITY: Moderate   GOALS: Goals reviewed with patient? Yes  SHORT TERM GOALS: Target date: 09/05/2022  Patient will be independent with initial HEP to improve outcomes and carryover.  Baseline: TBD Goal status: MET  08/22/22    2.  Patient will be educated on strategies to decrease risk of falls.  Baseline:  Goal status: IN PROGRESS  3.  Patient will demonstrate decreased TUG time to </= 20 sec to decrease risk for falls with transitional mobility. Baseline: 25.22 sec with rollator  Goal status: IN PROGRESS  LONG TERM GOALS: Target date: 10/03/2022  Patient will be independent with advanced/ongoing HEP to facilitate ability to maintain/progress functional gains  from skilled physical therapy services. Baseline: Goal status: IN PROGRESS  2.  Patient will be able to step up/down curb safely with LRAD for safety with community ambulation and increased ease of access to sunken room in home.  Baseline:  Goal status: IN PROGRESS   3.  Patient will demonstrate improved functional LE strength as demonstrated by ability to independently achieve sit to stand  without UE assist or loss of balance. Baseline: Requires B UE assist for majority of sit to stand; w/o UE assist, he requires increased momentum with LOB observed upon reaching standing Goal status: IN PROGRESS  4.  Patient will improve 5x STS time to </= 14.8 seconds w/o UE assist for improved efficiency and safety with transfers per age-matched norms. Baseline: 13.72 sec with B UE assist; able to complete single STS w/o UE assist but unable to maintain balance Goal status: IN PROGRESS   5.  Patient will demonstrate gait speed of >/= 2.62 ft/sec (0.8 m/s) to be a safe community ambulator with decreased risk for recurrent falls.  Baseline: 1.88 ft/sec with rollator Goal status: IN PROGRESS  6.  Patient will improve Berg score to >/= 41/56 to improve safety and stability with ADLs in standing and reduce risk for falls. (MCID= 8 points)  Baseline: 33/56  (43/56 as of DC from PT in 2019) Goal status: IN PROGRESS  09/04/22 - Berg: 40/56  7.  Patient will demonstrate at least 19/24 on DGI to improve gait stability and reduce risk for falls. Baseline: 13/24 on 08/14/22  (20/24 as of DC from PT in 2019) Goal status: IN PROGRESS  8  Patient will report >/= 50% on ABD scale to demonstrate improved functional ability. Baseline: 520 / 1600 = 32.5 % Goal status: IN PROGRESS   PLAN:  PT FREQUENCY: 1-2x/week, starting at 2x/wk, but reducing frequency to 1x/wk if too overwhelmed  PT DURATION: 8 weeks  PLANNED INTERVENTIONS: Therapeutic exercises, Therapeutic activity, Neuromuscular re-education, Balance training, Gait training, Patient/Family education, Self Care, Joint mobilization, Stair training, DME instructions, Dry Needling, Electrical stimulation, Cryotherapy, Moist heat, Taping, Ultrasound, Manual therapy, and Re-evaluation  PLAN FOR NEXT SESSION: Fall prevention education/home safety checklist with pt & wife; reassess TUG; postural and core/LE strengthening working towards more standing  exercises/activities; balance training, possibly integrating components of [place] fall prevention program  Marry Guan, [place] 09/04/22 12:30 PM

## 2022-09-06 DIAGNOSIS — M0589 Other rheumatoid arthritis with rheumatoid factor of multiple sites: Secondary | ICD-10-CM | POA: Diagnosis not present

## 2022-09-07 ENCOUNTER — Ambulatory Visit: Payer: Medicare Other

## 2022-09-07 DIAGNOSIS — R296 Repeated falls: Secondary | ICD-10-CM

## 2022-09-07 DIAGNOSIS — M6281 Muscle weakness (generalized): Secondary | ICD-10-CM

## 2022-09-07 DIAGNOSIS — R2689 Other abnormalities of gait and mobility: Secondary | ICD-10-CM

## 2022-09-07 DIAGNOSIS — R2681 Unsteadiness on feet: Secondary | ICD-10-CM

## 2022-09-07 NOTE — Therapy (Signed)
OUTPATIENT PHYSICAL THERAPY TREATMENT   Patient Name: Cory Gonzalez MRN: [id number] DOB:May 16, 1929, 89 y.o., male Today's Date: 09/07/2022   END OF SESSION:  PT End of Session - 09/07/22 1006     Visit Number 8    Date for PT Re-Evaluation 10/03/22    Authorization Type Medicare & BCBS    PT Start Time 0932    PT Stop Time 1013    PT Time Calculation (min) 41 min    Activity Tolerance Patient tolerated treatment well    Behavior During Therapy WFL for tasks assessed/performed                 Past Medical History:  Diagnosis Date   Chronic diastolic heart failure (HCC)    Dysrhythmia    A-fib   FUO (fever of unknown origin) 07/27/2019   Heart murmur    since childhood, never has had any problems   Hypertension    Paroxysmal atrial fibrillation (HCC)    Pneumonia    PONV (postoperative nausea and vomiting)    a little nausea   Rheumatoid arthritis (HCC)    Tick bite 07/27/2019   Past Surgical History:  Procedure Laterality Date   CATARACT EXTRACTION Bilateral    COLONOSCOPY     EXCISION MASS HEAD Left 03/29/2022   Procedure: EXCISION OF EAR MOH'S DEFECT;  Surgeon: Scarlette Ar, MD;  Location: [facility] OR;  Service: ENT;  Laterality: Left;   NASAL FLAP ROTATION Left 03/29/2022   Procedure: POSSIBLE FLAP ROTATION; POSSIBLE EAR CARTILAGE GRAFT;  Surgeon: Scarlette Ar, MD;  Location: [facility] OR;  Service: ENT;  Laterality: Left;   ORIF ELBOW FRACTURE Right 09/18/2017   Procedure: OPEN REDUCTION INTERNAL FIXATION (ORIF) RIGHT ELBOW/OLECRANON FRACTURE;  Surgeon: Eldred Manges, MD;  Location: [facility] OR;  Service: Orthopedics;  Laterality: Right;   PROSTATE SURGERY     SKIN FULL THICKNESS GRAFT Left 03/29/2022   Procedure: ADJACENT TISSUE TRANSFER;  SKIN GRAFT;  Surgeon: Scarlette Ar, MD;  Location: [facility] OR;  Service: ENT;  Laterality: Left;   Patient Active Problem List   Diagnosis Date Noted   FUO (fever of unknown origin) 07/27/2019   Tick bite 07/27/2019   Pneumothorax on  right 09/19/2017   Olecranon fracture 09/18/2017   Blepharitis of upper and lower eyelids of both eyes 04/19/2016   Pseudophakia of both eyes 04/19/2016   TB lung, latent 06/05/2011   Ocular hypertension, 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   Rheumatoid arthritis (HCC) 02/16/2004    PCP: [facility]   REFERRING PROVIDER:  Georgianne Fick, MD   REFERRING DIAG: R26.9 (ICD-10-CM) - Gait abnormality    THERAPY DIAG:  Repeated falls  Unsteadiness on feet  Other abnormalities of gait and mobility  Muscle weakness (generalized)  RATIONALE FOR EVALUATION AND TREATMENT: Rehabilitation  ONSET DATE: Progressive decline over past few years  NEXT MD VISIT: 02/20/23   SUBJECTIVE:  SUBJECTIVE STATEMENT: Pt reports a little pain in his L knee today. He noticed this after PT exercises.  Pt accompanied by: significant other - wife - [name] (in waiting room)  PAIN: Are you having pain? Yes: NPRS scale: 1/10 Pain location: L knee Pain description: aching    PERTINENT HISTORY:  Paroxysmal A-fib, chronic DHF, heart murmur, generalized OA, RA, COPD/emphysema, HTN, HLD, CKD, R elbow/olecranon fracture 2019, RLS  PRECAUTIONS: Fall  WEIGHT BEARING RESTRICTIONS: No  FALLS:  Has patient fallen in last 6 months? Yes. Number of falls >6  LIVING ENVIRONMENT: Lives with: lives with their spouse Lives in: House/apartment Stairs: Yes: Internal: 1 steps; sunken room and External: 1 steps; ramp Has following equipment at home: Single point cane, Walker - 2 wheeled, Environmental consultant - 4 wheeled, Wheelchair (manual), Ramped entry, and transport chair  OCCUPATION: Retired  PLOF: Independent and Leisure: most sedentary  PATIENT GOALS: "To do better with my balance, my walking and getting up out of a  chair."   OBJECTIVE: (objective measures completed at initial evaluation unless otherwise dated)  DIAGNOSTIC FINDINGS:  03/13/22 - CT cervical spine s/p fall: IMPRESSION: 1. No evidence of acute fracture to the cervical spine. 2. Nonspecific straightening of the expected cervical lordosis. 3. Grade 1 anterolisthesis at C2-C3, C3-C4, C6-C7 and T1-T2. 4. Cervical spondylosis, as described.  03/13/22 Head CT s/p fall with trauma to the head and face: IMPRESSION:  No acute or traumatic finding. Age related volume loss and chronic small-vessel ischemic change of the white matter.  COGNITION: Overall cognitive status: Within functional limits for tasks assessed   SENSATION: B LE RLS  COORDINATION: Slowed reciprocal movements  MUSCLE LENGTH: (TBA as indicated) Hamstrings:  ITB:  Piriformis:  Hip flexors:  Quads:  Heelcord:   POSTURE:  rounded shoulders, forward head, decreased lumbar lordosis, increased thoracic kyphosis, flexed trunk , and significant kyphoscoliosis  LOWER EXTREMITY MMT:  (tested in sitting on eval and 09/04/22)  MMT Right Eval Left Eval R 09/04/22 L 09/04/22  Hip flexion 4- 4- 4- 4-  Hip extension 4 4- 4 4-  Hip abduction 4+ 4 4+ 4+  Hip adduction 5 4+ 5 5  Hip internal rotation 4+ 4- 4+ 4+  Hip external rotation 4- 4- 4 4  Knee flexion 4 4- 4+ 4+  Knee extension 4+ 4 4+ 4+  Ankle dorsiflexion 3+ 3+ 4- 4  Ankle plantarflexion      Ankle inversion      Ankle eversion      (Blank rows = not tested)  BED MOBILITY:  NT  TRANSFERS: Assistive device utilized: Environmental consultant - 4 wheeled  Sit to stand: SBA and CGA - CGA when performing without UE assist Stand to sit: SBA Chair to chair: SBA Floor:  NT  GAIT: Distance walked: 80 ft Assistive device utilized: Environmental consultant - 4 wheeled Level of assistance: Modified independence and SBA Gait pattern: step through pattern, decreased stride length, decreased hip/knee flexion- Right, decreased hip/knee flexion- Left, and  trunk flexed Comments:  RAMP: Level of Assistance:  NT Assistive device utilized: NT Ramp Comments:   CURB:  Level of Assistance:  NT Assistive device utilized:  NT Curb Comments:   STAIRS:  Level of Assistance: NT  Stair Negotiation Technique:   Number of Stairs:    Height of Stairs:   Comments:   FUNCTIONAL TESTS:  5 times sit to stand: 13.72 sec with B UE assist; able to complete single STS w/o UE assist but unable to maintain balance Timed  up and go (TUG): 25.22 sec with rollator - 08/14/22 10 meter walk test: 17.44 sec with rollator; Gait speed = 1.88 ft sec with rollator - 08/14/22 Berg Balance Scale: 33/56; < 36 high risk for falls (close to 100%) Dynamic Gait Index: 13/24, Scores of 19 or less are predictive of falls in older community living adults - 08/14/22  09/04/22 Berg: 40/56; 37-45 significant risk for fall (>80%)   PATIENT SURVEYS:  ABC scale 520 / 1600 = 32.5 %   TODAY'S TREATMENT:   09/07/22 THERAPEUTIC EXERCISE: to improve flexibility, strength and mobility.  Demonstration, verbal and tactile cues throughout for technique.  Gait for 270 ft with RW- flexed posture cues for upright posture  TUG: 17 seconds with RW Seated LAQ x 10 2# Seated march 2# x 10  Standing TKE with ball on wall x 10 bil Standing toe raise against wall x 10 bil Sit to stand x 10 focusing on reaching for arm rest before standing and when sitting Standing hip extension x 10 bil Standing hip abduction x 10 bil  09/04/22 THERAPEUTIC EXERCISE: to improve flexibility, strength and mobility.  Demonstration, verbal and tactile cues throughout for technique.  NuStep - L5 x 6 min Standing hip/trunk extension with buttocks leaning against mat table x 10 Standing lumbar extension with forearms on wall x 10  THERAPEUTIC ACTIVITIES:  LE MMT Berg: 40/56  NEUROMUSCULAR RE-EDUCATION: To improve posture, balance, coordination, and reduce fall risk. Corner balance progression with 3/4 tandem  stance, staggered stance, right SLS, and left SLS on firm surface with intermittent UE support on wall or back of chairs            Eyes open: - static stance x 30 sec Alt toe clears to 9" step x 10 with B UE support of back of chair in corner Alt toe clears to 9" step x 10 with single UE support of back of chair in corner Corner balance progression with  R/L foot on 9" step  on firm surface with  B UE support progressing to single UE support on back of chair            Eyes open: - static stance x 30 sec   08/28/22 TherEx Nustep L5x6 minutes BLEs/BUEs  Seated marches green TB above knees x15 B Seated clams green TB above knees x15  Seated LAQs green TB at ankles x10 B STS on blue foam pad x10 Min-ModA for boost from low table, cues for eccentric lower  Seated heel-toe raises x20   NMR Narrow BOS on blue foam pad EO x90 seconds MinA WNL BOS on blue foam pad EC x60 seconds Min-ModA  Semi-tandem stance solid surface 2x60 seconds B up to MinA     08/22/22 THERAPEUTIC EXERCISE: to improve flexibility, strength and mobility.  Demonstration, verbal and tactile cues throughout for technique.  NuStep - L4 x 6 min Seated upper back extension press at edge of mat table 10 x 3"  Seated YTB scap retraction + row 10 x 3", 2 sets - cues for scap retraction and to push chest fwd Seated YTB scap retraction + shoulder extension 10 x 3", 2 sets - cues for scap retraction and to push chest fwd Seated TrA symmetric + bilateral hand press into blue/green striped ball focusing on core activation with good upright posture 3 x 5" - discontinued due to complaint of increased chest pain Seated trunk extension + lifting blue/green striped ball OH x 10 Seated trunk extension + alt  diagonal lifting blue/green striped  ball OH x 10 Seated trunk rotation while holding blue/green striped in front x 10 Seated GTB hip ABD clam x 10 Seated GTB hip flexion march x 10 Seated GTB alt ankle DF x 10 bil Standing alt  hip ABD x 10 bil, UE support on rollator for balance Standing alt hip extension  x 10 bil, UE support on rollator for balance  THERAPEUTIC ACTIVITIES: Review of safe use of rollator during transfers (positioning of walker, proper use of handbrakes, and proper hand placement during transfer) and good proximity to rollator during gait to decrease fall risk.   PATIENT EDUCATION:  Education details: progress with PT, ongoing PT POC, transfer safety, and gait safety with rollator   Person educated: Patient Education method: Explanation, Demonstration, and Verbal cues Education comprehension: verbalized understanding, returned demonstration, verbal cues required, and needs further education  HOME EXERCISE PROGRAM: Access Code: [email or web address] URL: [email or web address] Date: 08/20/2022 Prepared by: Glenetta Hew  Exercises - Seated Thoracic Lumbar Extension  - 1 x daily - 7 x weekly - 2 sets - 10 reps - 3 sec hold - Seated Scapular Retraction with External Rotation  - 1 x daily - 7 x weekly - 2 sets - 10 reps - 3 sec hold - Seated Hip Abduction with Resistance  - 1 x daily - 7 x weekly - 2 sets - 10 reps - 3-5 sec hold - Seated March with Resistance  - 1 x daily - 7 x weekly - 2 sets - 10 reps - 3 sec hold - Seated Ankle Dorsiflexion with Resistance  - 1 x daily - 7 x weekly - 2 sets - 10 reps - 3 sec hold - Long Sitting Upper Back Extension Press  - 1 x daily - 7 x weekly - 2 sets - 10 reps - 3 sec hold - Seated Shoulder Row with Anchored Resistance  - 1 x daily - 7 x weekly - 2 sets - 10 reps - 3-5 hold hold   ASSESSMENT:  CLINICAL IMPRESSION: Pt has met all STGs. He improved TUG score to 17 seconds today. Educated him and his wife on fall prevention checklist (do not have grab bars in bathroom). He continues to need postural cues with exercises and gait training. He also needs cues with most exercises to correct form. He was noting some discomfort in his L knee today so we worked  on some knee strengthening for the quads.  Ray will benefit from continued skilled PT to address ongoing strength, posture and balance deficits to improve mobility and activity tolerance with decreased risk for falls.   OBJECTIVE IMPAIRMENTS: Abnormal gait, decreased activity tolerance, decreased balance, decreased coordination, decreased knowledge of condition, decreased knowledge of use of DME, decreased mobility, difficulty walking, decreased ROM, decreased strength, decreased safety awareness, impaired perceived functional ability, impaired flexibility, impaired sensation, improper body mechanics, postural dysfunction, and pain.   ACTIVITY LIMITATIONS: carrying, lifting, bending, standing, squatting, stairs, transfers, bed mobility, locomotion level, and caring for others  PARTICIPATION LIMITATIONS: meal prep, cleaning, laundry, driving, community activity, and yard work  PERSONAL FACTORS: Age, Fitness, Past/current experiences, Time since onset of injury/illness/exacerbation, and 3+ comorbidities: Paroxysmal A-fib, chronic DHF, heart murmur, generalized OA, RA, COPD/emphysema, HTN, HLD, CKD, R elbow/olecranon fracture 2019, RLS  are also affecting patient's functional outcome.   REHAB POTENTIAL: Good  CLINICAL DECISION MAKING: Evolving/moderate complexity  EVALUATION COMPLEXITY: Moderate   GOALS: Goals reviewed with patient? Yes  SHORT TERM GOALS: Target date: 09/05/2022  Patient  will be independent with initial HEP to improve outcomes and carryover.  Baseline: TBD Goal status: MET  08/22/22    2.  Patient will be educated on strategies to decrease risk of falls.  Baseline:  Goal status: MET- 09/07/22  3.  Patient will demonstrate decreased TUG time to </= 20 sec to decrease risk for falls with transitional mobility. Baseline: 25.22 sec with rollator  Goal status: MET- 09/07/22  LONG TERM GOALS: Target date: 10/03/2022  Patient will be independent with advanced/ongoing HEP to  facilitate ability to maintain/progress functional gains from skilled physical therapy services. Baseline: Goal status: IN PROGRESS  2.  Patient will be able to step up/down curb safely with LRAD for safety with community ambulation and increased ease of access to sunken room in home.  Baseline:  Goal status: IN PROGRESS   3.  Patient will demonstrate improved functional LE strength as demonstrated by ability to independently achieve sit to stand without UE assist or loss of balance. Baseline: Requires B UE assist for majority of sit to stand; w/o UE assist, he requires increased momentum with LOB observed upon reaching standing Goal status: IN PROGRESS  4.  Patient will improve 5x STS time to </= 14.8 seconds w/o UE assist for improved efficiency and safety with transfers per age-matched norms. Baseline: 13.72 sec with B UE assist; able to complete single STS w/o UE assist but unable to maintain balance Goal status: IN PROGRESS   5.  Patient will demonstrate gait speed of >/= 2.62 ft/sec (0.8 m/s) to be a safe community ambulator with decreased risk for recurrent falls.  Baseline: 1.88 ft/sec with rollator Goal status: IN PROGRESS  6.  Patient will improve Berg score to >/= 41/56 to improve safety and stability with ADLs in standing and reduce risk for falls. (MCID= 8 points)  Baseline: 33/56  (43/56 as of DC from PT in 2019) Goal status: IN PROGRESS  09/04/22 - Berg: 40/56  7.  Patient will demonstrate at least 19/24 on DGI to improve gait stability and reduce risk for falls. Baseline: 13/24 on 08/14/22  (20/24 as of DC from PT in 2019) Goal status: IN PROGRESS  8  Patient will report >/= 50% on ABD scale to demonstrate improved functional ability. Baseline: 520 / 1600 = 32.5 % Goal status: IN PROGRESS   PLAN:  PT FREQUENCY: 1-2x/week, starting at 2x/wk, but reducing frequency to 1x/wk if too overwhelmed  PT DURATION: 8 weeks  PLANNED INTERVENTIONS: Therapeutic exercises,  Therapeutic activity, Neuromuscular re-education, Balance training, Gait training, Patient/Family education, Self Care, Joint mobilization, Stair training, DME instructions, Dry Needling, Electrical stimulation, Cryotherapy, Moist heat, Taping, Ultrasound, Manual therapy, and Re-evaluation  PLAN FOR NEXT SESSION:  postural and core/LE strengthening working towards more standing exercises/activities; balance training, possibly integrating components of [place] fall prevention program  Darleene Cleaver, PTA 09/07/22 10:14 AM

## 2022-09-11 ENCOUNTER — Encounter: Payer: Self-pay | Admitting: Physical Therapy

## 2022-09-11 ENCOUNTER — Ambulatory Visit: Payer: Medicare Other | Admitting: Physical Therapy

## 2022-09-11 DIAGNOSIS — R2681 Unsteadiness on feet: Secondary | ICD-10-CM | POA: Diagnosis not present

## 2022-09-11 DIAGNOSIS — R2689 Other abnormalities of gait and mobility: Secondary | ICD-10-CM

## 2022-09-11 DIAGNOSIS — R296 Repeated falls: Secondary | ICD-10-CM

## 2022-09-11 DIAGNOSIS — M6281 Muscle weakness (generalized): Secondary | ICD-10-CM | POA: Diagnosis not present

## 2022-09-11 NOTE — Therapy (Addendum)
OUTPATIENT PHYSICAL THERAPY TREATMENT   Patient Name: Cory Gonzalez MRN: [id number] DOB:24-Mar-1929, 89 y.o., male Today's Date: 09/11/2022   END OF SESSION:  PT End of Session - 09/11/22 1017     Visit Number 9    Date for PT Re-Evaluation 10/03/22    Authorization Type Medicare & BCBS    PT Start Time 1017    PT Stop Time 1058    PT Time Calculation (min) 41 min    Activity Tolerance Patient tolerated treatment well    Behavior During Therapy WFL for tasks assessed/performed                 Past Medical History:  Diagnosis Date   Chronic diastolic heart failure (HCC)    Dysrhythmia    A-fib   FUO (fever of unknown origin) 07/27/2019   Heart murmur    since childhood, never has had any problems   Hypertension    Paroxysmal atrial fibrillation (HCC)    Pneumonia    PONV (postoperative nausea and vomiting)    a little nausea   Rheumatoid arthritis (HCC)    Tick bite 07/27/2019   Past Surgical History:  Procedure Laterality Date   CATARACT EXTRACTION Bilateral    COLONOSCOPY     EXCISION MASS HEAD Left 03/29/2022   Procedure: EXCISION OF EAR MOH'S DEFECT;  Surgeon: Scarlette Ar, MD;  Location: [facility] OR;  Service: ENT;  Laterality: Left;   NASAL FLAP ROTATION Left 03/29/2022   Procedure: POSSIBLE FLAP ROTATION; POSSIBLE EAR CARTILAGE GRAFT;  Surgeon: Scarlette Ar, MD;  Location: [facility] OR;  Service: ENT;  Laterality: Left;   ORIF ELBOW FRACTURE Right 09/18/2017   Procedure: OPEN REDUCTION INTERNAL FIXATION (ORIF) RIGHT ELBOW/OLECRANON FRACTURE;  Surgeon: Eldred Manges, MD;  Location: [facility] OR;  Service: Orthopedics;  Laterality: Right;   PROSTATE SURGERY     SKIN FULL THICKNESS GRAFT Left 03/29/2022   Procedure: ADJACENT TISSUE TRANSFER;  SKIN GRAFT;  Surgeon: Scarlette Ar, MD;  Location: [facility] OR;  Service: ENT;  Laterality: Left;   Patient Active Problem List   Diagnosis Date Noted   FUO (fever of unknown origin) 07/27/2019   Tick bite 07/27/2019   Pneumothorax on  right 09/19/2017   Olecranon fracture 09/18/2017   Blepharitis of upper and lower eyelids of both eyes 04/19/2016   Pseudophakia of both eyes 04/19/2016   TB lung, latent 06/05/2011   Ocular hypertension, 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   Rheumatoid arthritis (HCC) 02/16/2004    PCP: [facility]   REFERRING PROVIDER:  Georgianne Fick, MD   REFERRING DIAG: R26.9 (ICD-10-CM) - Gait abnormality    THERAPY DIAG:  Repeated falls  Unsteadiness on feet  Other abnormalities of gait and mobility  Muscle weakness (generalized)  RATIONALE FOR EVALUATION AND TREATMENT: Rehabilitation  ONSET DATE: Progressive decline over past few years  NEXT MD VISIT: 02/20/23   SUBJECTIVE:  SUBJECTIVE STATEMENT: Pt reports a little pain in his L knee today. He noticed this after PT exercises.  Pt accompanied by: significant other - wife - [name] (in waiting room)  PAIN: Are you having pain? Yes: NPRS scale: 1/10 Pain location: L knee Pain description: aching    PERTINENT HISTORY:  Paroxysmal A-fib, chronic DHF, heart murmur, generalized OA, RA, COPD/emphysema, HTN, HLD, CKD, R elbow/olecranon fracture 2019, RLS  PRECAUTIONS: Fall  WEIGHT BEARING RESTRICTIONS: No  FALLS:  Has patient fallen in last 6 months? Yes. Number of falls >6  LIVING ENVIRONMENT: Lives with: lives with their spouse Lives in: House/apartment Stairs: Yes: Internal: 1 steps; sunken room and External: 1 steps; ramp Has following equipment at home: Single point cane, Walker - 2 wheeled, Environmental consultant - 4 wheeled, Wheelchair (manual), Ramped entry, and transport chair  OCCUPATION: Retired  PLOF: Independent and Leisure: most sedentary  PATIENT GOALS: "To do better with my balance, my walking and getting up out of a  chair."   OBJECTIVE: (objective measures completed at initial evaluation unless otherwise dated)  DIAGNOSTIC FINDINGS:  03/13/22 - CT cervical spine s/p fall: IMPRESSION: 1. No evidence of acute fracture to the cervical spine. 2. Nonspecific straightening of the expected cervical lordosis. 3. Grade 1 anterolisthesis at C2-C3, C3-C4, C6-C7 and T1-T2. 4. Cervical spondylosis, as described.  03/13/22 Head CT s/p fall with trauma to the head and face: IMPRESSION:  No acute or traumatic finding. Age related volume loss and chronic small-vessel ischemic change of the white matter.  COGNITION: Overall cognitive status: Within functional limits for tasks assessed   SENSATION: B LE RLS  COORDINATION: Slowed reciprocal movements  MUSCLE LENGTH: (TBA as indicated) Hamstrings:  ITB:  Piriformis:  Hip flexors:  Quads:  Heelcord:   POSTURE:  rounded shoulders, forward head, decreased lumbar lordosis, increased thoracic kyphosis, flexed trunk , and significant kyphoscoliosis  LOWER EXTREMITY MMT:  (tested in sitting on eval and 09/04/22)  MMT Right Eval Left Eval R 09/04/22 L 09/04/22  Hip flexion 4- 4- 4- 4-  Hip extension 4 4- 4 4-  Hip abduction 4+ 4 4+ 4+  Hip adduction 5 4+ 5 5  Hip internal rotation 4+ 4- 4+ 4+  Hip external rotation 4- 4- 4 4  Knee flexion 4 4- 4+ 4+  Knee extension 4+ 4 4+ 4+  Ankle dorsiflexion 3+ 3+ 4- 4  Ankle plantarflexion      Ankle inversion      Ankle eversion      (Blank rows = not tested)  BED MOBILITY:  NT  TRANSFERS: Assistive device utilized: Environmental consultant - 4 wheeled  Sit to stand: SBA and CGA - CGA when performing without UE assist Stand to sit: SBA Chair to chair: SBA Floor:  NT  GAIT: Distance walked: 80 ft Assistive device utilized: Environmental consultant - 4 wheeled Level of assistance: Modified independence and SBA Gait pattern: step through pattern, decreased stride length, decreased hip/knee flexion- Right, decreased hip/knee flexion- Left, and  trunk flexed Comments:  RAMP: Level of Assistance:  NT Assistive device utilized: NT Ramp Comments:   CURB:  Level of Assistance:  NT Assistive device utilized:  NT Curb Comments:   STAIRS:  Level of Assistance: NT  Stair Negotiation Technique:   Number of Stairs:    Height of Stairs:   Comments:   FUNCTIONAL TESTS:  5 times sit to stand: 13.72 sec with B UE assist; able to complete single STS w/o UE assist but unable to maintain balance Timed  up and go (TUG): 25.22 sec with rollator - 08/14/22 10 meter walk test: 17.44 sec with rollator; Gait speed = 1.88 ft sec with rollator - 08/14/22 Berg Balance Scale: 33/56; < 36 high risk for falls (close to 100%) Dynamic Gait Index: 13/24, Scores of 19 or less are predictive of falls in older community living adults - 08/14/22  09/04/22 Berg: 40/56; 37-45 significant risk for fall (>80%)   09/07/22 TUG: 17 seconds with rollator  PATIENT SURVEYS:  ABC scale 520 / 1600 = 32.5 %   TODAY'S TREATMENT:    09/11/22 THERAPEUTIC EXERCISE: to improve flexibility, strength and mobility.  Demonstration, verbal and tactile cues throughout for technique.  Rec Bike - L1 x 6 min Standing R/L hip ABD with looped RTB at ankles x 10 bil; UE support on locked rollator for balance Standing R/L hip extension with looped RTB at ankles x 10 bil; UE support on locked rollator for balance Standing R/L hip flexion march with looped RTB at midfeet x 10 bil; UE support on locked rollator for balance  NEUROMUSCULAR RE-EDUCATION: To improve posture, balance, proprioception, coordination, reduce fall risk, and amplitude of movement.  Standing 5-way star to taps to colored dots x 5; single UE support on back of chair Weight shifts ant/post in staggered stance x 10 with each foot fwd Lateral weight shifts in wide BOS x 10 bil B side-stepping 3 x 10 ft along counter with light UE support on counter and SBA of PT Fwd/back tandem gait 2 x 10 ft along counter with  single R UE support on counter and SBA/CGA of PT Retro gait 2 x 10 ft along counter with single UE support on counter and SBA/CGA of PT   09/07/22 THERAPEUTIC EXERCISE: to improve flexibility, strength and mobility.  Demonstration, verbal and tactile cues throughout for technique.  Gait for 270 ft with RW- flexed posture cues for upright posture  TUG: 17 seconds with RW Seated LAQ x 10 2# Seated march 2# x 10  Standing TKE with ball on wall x 10 bil Standing toe raise against wall x 10 bil Sit to stand x 10 focusing on reaching for arm rest before standing and when sitting Standing hip extension x 10 bil Standing hip abduction x 10 bil   09/04/22 THERAPEUTIC EXERCISE: to improve flexibility, strength and mobility.  Demonstration, verbal and tactile cues throughout for technique.  NuStep - L5 x 6 min Standing hip/trunk extension with buttocks leaning against mat table x 10 Standing lumbar extension with forearms on wall x 10  THERAPEUTIC ACTIVITIES:  LE MMT Berg: 40/56  NEUROMUSCULAR RE-EDUCATION: To improve posture, balance, coordination, and reduce fall risk. Corner balance progression with 3/4 tandem stance, staggered stance, right SLS, and left SLS on firm surface with intermittent UE support on wall or back of chairs            Eyes open: - static stance x 30 sec Alt toe clears to 9" step x 10 with B UE support of back of chair in corner Alt toe clears to 9" step x 10 with single UE support of back of chair in corner Corner balance progression with  R/L foot on 9" step  on firm surface with  B UE support progressing to single UE support on back of chair            Eyes open: - static stance x 30 sec   PATIENT EDUCATION:  Education details: progress with PT, ongoing PT POC, transfer safety,  and gait safety with rollator   Person educated: Patient Education method: Explanation, Demonstration, and Verbal cues Education comprehension: verbalized understanding, returned  demonstration, verbal cues required, and needs further education  HOME EXERCISE PROGRAM: Access Code: [email or web address] URL: [email or web address] Date: 08/20/2022 Prepared by: Glenetta Hew  Exercises - Seated Thoracic Lumbar Extension  - 1 x daily - 7 x weekly - 2 sets - 10 reps - 3 sec hold - Seated Scapular Retraction with External Rotation  - 1 x daily - 7 x weekly - 2 sets - 10 reps - 3 sec hold - Seated Hip Abduction with Resistance (GTB) - 1 x daily - 7 x weekly - 2 sets - 10 reps - 3-5 sec hold - Seated March with Resistance (GTB) - 1 x daily - 7 x weekly - 2 sets - 10 reps - 3 sec hold - Seated Ankle Dorsiflexion with Resistance (GTB) - 1 x daily - 7 x weekly - 2 sets - 10 reps - 3 sec hold - Long Sitting Upper Back Extension Press  - 1 x daily - 7 x weekly - 2 sets - 10 reps - 3 sec hold - Seated Shoulder Row with Anchored Resistance (GTB) - 1 x daily - 7 x weekly - 2 sets - 10 reps - 3-5 hold hold   ASSESSMENT:  CLINICAL IMPRESSION: Josuel "Ray" reports more tiredness today but was able to complete therapeutic exercises and activities with no more than the usual rest breaks.  Some difficulty witnessed with coordinating movement patterns for exercises and weight shifting activities, requiring repeated reinstruction with verbal and visual cues.  With stepping activities he tends to want to drag his feet, therefore cues provided for increased hip and knee flexion for improved foot clearance. Ray will benefit from continued skilled PT to address ongoing strength, posture and balance deficits to improve mobility and activity tolerance with decreased risk for falls.   OBJECTIVE IMPAIRMENTS: Abnormal gait, decreased activity tolerance, decreased balance, decreased coordination, decreased knowledge of condition, decreased knowledge of use of DME, decreased mobility, difficulty walking, decreased ROM, decreased strength, decreased safety awareness, impaired perceived functional ability,  impaired flexibility, impaired sensation, improper body mechanics, postural dysfunction, and pain.   ACTIVITY LIMITATIONS: carrying, lifting, bending, standing, squatting, stairs, transfers, bed mobility, locomotion level, and caring for others  PARTICIPATION LIMITATIONS: meal prep, cleaning, laundry, driving, community activity, and yard work  PERSONAL FACTORS: Age, Fitness, Past/current experiences, Time since onset of injury/illness/exacerbation, and 3+ comorbidities: Paroxysmal A-fib, chronic DHF, heart murmur, generalized OA, RA, COPD/emphysema, HTN, HLD, CKD, R elbow/olecranon fracture 2019, RLS  are also affecting patient's functional outcome.   REHAB POTENTIAL: Good  CLINICAL DECISION MAKING: Evolving/moderate complexity  EVALUATION COMPLEXITY: Moderate   GOALS: Goals reviewed with patient? Yes  SHORT TERM GOALS: Target date: 09/05/2022  Patient will be independent with initial HEP to improve outcomes and carryover.  Baseline: TBD Goal status: MET  08/22/22    2.  Patient will be educated on strategies to decrease risk of falls.  Baseline:  Goal status: MET  09/07/22  3.  Patient will demonstrate decreased TUG time to </= 20 sec to decrease risk for falls with transitional mobility. Baseline: 25.22 sec with rollator  Goal status: MET  09/07/22 - 17 sec with rollator  LONG TERM GOALS: Target date: 10/03/2022  Patient will be independent with advanced/ongoing HEP to facilitate ability to maintain/progress functional gains from skilled physical therapy services. Baseline: Goal status: IN PROGRESS  2.  Patient will be able to  step up/down curb safely with LRAD for safety with community ambulation and increased ease of access to sunken room in home.  Baseline:  Goal status: IN PROGRESS   3.  Patient will demonstrate improved functional LE strength as demonstrated by ability to independently achieve sit to stand without UE assist or loss of balance. Baseline: Requires B UE assist  for majority of sit to stand; w/o UE assist, he requires increased momentum with LOB observed upon reaching standing Goal status: IN PROGRESS  4.  Patient will improve 5x STS time to </= 14.8 seconds w/o UE assist for improved efficiency and safety with transfers per age-matched norms. Baseline: 13.72 sec with B UE assist; able to complete single STS w/o UE assist but unable to maintain balance Goal status: IN PROGRESS   5.  Patient will demonstrate gait speed of >/= 2.62 ft/sec (0.8 m/s) to be a safe community ambulator with decreased risk for recurrent falls.  Baseline: 1.88 ft/sec with rollator Goal status: IN PROGRESS  6.  Patient will improve Berg score to >/= 41/56 to improve safety and stability with ADLs in standing and reduce risk for falls. (MCID= 8 points)  Baseline: 33/56  (43/56 as of DC from PT in 2019) Goal status: IN PROGRESS  09/04/22 - Berg: 40/56  7.  Patient will demonstrate at least 19/24 on DGI to improve gait stability and reduce risk for falls. Baseline: 13/24 on 08/14/22  (20/24 as of DC from PT in 2019) Goal status: IN PROGRESS  8  Patient will report >/= 50% on ABD scale to demonstrate improved functional ability. Baseline: 520 / 1600 = 32.5 % Goal status: IN PROGRESS   PLAN:  PT FREQUENCY: 1-2x/week, starting at 2x/wk, but reducing frequency to 1x/wk if too overwhelmed  PT DURATION: 8 weeks  PLANNED INTERVENTIONS: Therapeutic exercises, Therapeutic activity, Neuromuscular re-education, Balance training, Gait training, Patient/Family education, Self Care, Joint mobilization, Stair training, DME instructions, Dry Needling, Electrical stimulation, Cryotherapy, Moist heat, Taping, Ultrasound, Manual therapy, and Re-evaluation  PLAN FOR NEXT SESSION:  10th visit PN; postural and core/LE strengthening working towards more standing exercises/activities; balance training, possibly integrating components of [place] fall prevention program  Marry Guan,  09/11/22  12:47 PM

## 2022-09-17 DIAGNOSIS — Z48817 Encounter for surgical aftercare following surgery on the skin and subcutaneous tissue: Secondary | ICD-10-CM | POA: Diagnosis not present

## 2022-09-17 DIAGNOSIS — C44319 Basal cell carcinoma of skin of other parts of face: Secondary | ICD-10-CM | POA: Diagnosis not present

## 2022-09-17 DIAGNOSIS — C44222 Squamous cell carcinoma of skin of right ear and external auricular canal: Secondary | ICD-10-CM | POA: Diagnosis not present

## 2022-09-24 ENCOUNTER — Ambulatory Visit: Payer: Medicare Other | Admitting: Physical Therapy

## 2022-09-24 ENCOUNTER — Encounter: Payer: Self-pay | Admitting: Physical Therapy

## 2022-09-24 DIAGNOSIS — R2681 Unsteadiness on feet: Secondary | ICD-10-CM

## 2022-09-24 DIAGNOSIS — R2689 Other abnormalities of gait and mobility: Secondary | ICD-10-CM

## 2022-09-24 DIAGNOSIS — M6281 Muscle weakness (generalized): Secondary | ICD-10-CM | POA: Diagnosis not present

## 2022-09-24 DIAGNOSIS — R296 Repeated falls: Secondary | ICD-10-CM

## 2022-09-24 NOTE — Therapy (Signed)
OUTPATIENT PHYSICAL THERAPY TREATMENT / RECERTIFICATION  Progress Note  Reporting Period 08/08/2022 to 09/24/2022   See note below for Objective Data and Assessment of Progress/Goals.     Patient Name: Cory Gonzalez MRN: [id number] DOB:1929-11-04, 89 y.o., male Today's Date: 09/24/2022   END OF SESSION:  PT End of Session - 09/24/22 1532     Visit Number 10    Date for PT Re-Evaluation 10/03/22    Authorization Type Medicare & BCBS    PT Start Time 1532    PT Stop Time 1618    PT Time Calculation (min) 46 min    Activity Tolerance Patient tolerated treatment well    Behavior During Therapy WFL for tasks assessed/performed                  Past Medical History:  Diagnosis Date   Chronic diastolic heart failure (HCC)    Dysrhythmia    A-fib   FUO (fever of unknown origin) 07/27/2019   Heart murmur    since childhood, never has had any problems   Hypertension    Paroxysmal atrial fibrillation (HCC)    Pneumonia    PONV (postoperative nausea and vomiting)    a little nausea   Rheumatoid arthritis (HCC)    Tick bite 07/27/2019   Past Surgical History:  Procedure Laterality Date   CATARACT EXTRACTION Bilateral    COLONOSCOPY     EXCISION MASS HEAD Left 03/29/2022   Procedure: EXCISION OF EAR MOH'S DEFECT;  Surgeon: Scarlette Ar, MD;  Location: [facility] OR;  Service: ENT;  Laterality: Left;   NASAL FLAP ROTATION Left 03/29/2022   Procedure: POSSIBLE FLAP ROTATION; POSSIBLE EAR CARTILAGE GRAFT;  Surgeon: Scarlette Ar, MD;  Location: [facility] OR;  Service: ENT;  Laterality: Left;   ORIF ELBOW FRACTURE Right 09/18/2017   Procedure: OPEN REDUCTION INTERNAL FIXATION (ORIF) RIGHT ELBOW/OLECRANON FRACTURE;  Surgeon: Eldred Manges, MD;  Location: [facility] OR;  Service: Orthopedics;  Laterality: Right;   PROSTATE SURGERY     SKIN FULL THICKNESS GRAFT Left 03/29/2022   Procedure: ADJACENT TISSUE TRANSFER;  SKIN GRAFT;  Surgeon: Scarlette Ar, MD;  Location[facility] OR;  Service: ENT;   Laterality: Left;   Patient Active Problem List   Diagnosis Date Noted   FUO (fever of unknown origin) 07/27/2019   Tick bite 07/27/2019   Pneumothorax on right 09/19/2017   Olecranon fracture 09/18/2017   Blepharitis of upper and lower eyelids of both eyes 04/19/2016   Pseudophakia of both eyes 04/19/2016   TB lung, latent 06/05/2011   Ocular hypertension, 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   Rheumatoid arthritis (HCC) 02/16/2004    PCP: [facility]   REFERRING PROVIDER:  Georgianne Fick, MD   REFERRING DIAG: R26.9 (ICD-10-CM) - Gait abnormality    THERAPY DIAG:  Repeated falls  Unsteadiness on feet  Other abnormalities of gait and mobility  Muscle weakness (generalized)  RATIONALE FOR EVALUATION AND TREATMENT: Rehabilitation  ONSET DATE: Progressive decline over past few years  NEXT MD VISIT: 02/20/23   SUBJECTIVE:  SUBJECTIVE STATEMENT: Pt reports denies pain today. Feels like he is doing better but unable to identify specific improvements. Still feels unsure with his walking - "seems like my feet don't work as well as they did". "My problem is I'm worn out."  Pt accompanied by: significant other - wife - Cory Gonzalez (in waiting room)  PAIN: Are you having pain? No   PERTINENT HISTORY:  Paroxysmal A-fib, chronic DHF, heart murmur, generalized OA, RA, COPD/emphysema, HTN, HLD, CKD, R elbow/olecranon fracture 2019, RLS  PRECAUTIONS: Fall  WEIGHT BEARING RESTRICTIONS: No  FALLS:  Has patient fallen in last 6 months? Yes. Number of falls >6  LIVING ENVIRONMENT: Lives with: lives with their spouse Lives in: House/apartment Stairs: Yes: Internal: 1 steps; sunken room and External: 1 steps; ramp Has following equipment at home: Single point cane, Walker - 2  wheeled, Environmental consultant - 4 wheeled, Wheelchair (manual), Ramped entry, and transport chair  OCCUPATION: Retired  PLOF: Independent and Leisure: most sedentary  PATIENT GOALS: "To do better with my balance, my walking and getting up out of a chair."   OBJECTIVE: (objective measures completed at initial evaluation unless otherwise dated)  DIAGNOSTIC FINDINGS:  03/13/22 - CT cervical spine s/p fall: IMPRESSION: 1. No evidence of acute fracture to the cervical spine. 2. Nonspecific straightening of the expected cervical lordosis. 3. Grade 1 anterolisthesis at C2-C3, C3-C4, C6-C7 and T1-T2. 4. Cervical spondylosis, as described.  03/13/22 Head CT s/p fall with trauma to the head and face: IMPRESSION:  No acute or traumatic finding. Age related volume loss and chronic small-vessel ischemic change of the white matter.  COGNITION: Overall cognitive status: Within functional limits for tasks assessed   SENSATION: B LE RLS  COORDINATION: Slowed reciprocal movements  MUSCLE LENGTH: (TBA as indicated) Hamstrings:  ITB:  Piriformis:  Hip flexors:  Quads:  Heelcord:   POSTURE:  rounded shoulders, forward head, decreased lumbar lordosis, increased thoracic kyphosis, flexed trunk , and significant kyphoscoliosis  LOWER EXTREMITY MMT:  (tested in sitting on eval and 09/04/22)  MMT Right Eval Left Eval R 09/04/22 L 09/04/22  Hip flexion 4- 4- 4- 4-  Hip extension 4 4- 4 4-  Hip abduction 4+ 4 4+ 4+  Hip adduction 5 4+ 5 5  Hip internal rotation 4+ 4- 4+ 4+  Hip external rotation 4- 4- 4 4  Knee flexion 4 4- 4+ 4+  Knee extension 4+ 4 4+ 4+  Ankle dorsiflexion 3+ 3+ 4- 4  Ankle plantarflexion      Ankle inversion      Ankle eversion      (Blank rows = not tested)  BED MOBILITY:  NT  TRANSFERS: Assistive device utilized: Environmental consultant - 4 wheeled  Sit to stand: SBA and CGA - CGA when performing without UE assist Stand to sit: SBA Chair to chair: SBA Floor:  NT  GAIT: Distance walked: 80  ft Assistive device utilized: Environmental consultant - 4 wheeled Level of assistance: Modified independence and SBA Gait pattern: step through pattern, decreased stride length, decreased hip/knee flexion- Right, decreased hip/knee flexion- Left, and trunk flexed Comments:  RAMP: Level of Assistance:  NT Assistive device utilized: NT Ramp Comments:   CURB:  Level of Assistance:  NT Assistive device utilized:  NT Curb Comments:   STAIRS:  Level of Assistance: NT  Stair Negotiation Technique:   Number of Stairs:    Height of Stairs:   Comments:   FUNCTIONAL TESTS:  5 times sit to stand: 13.72 sec with B UE assist;  able to complete single STS w/o UE assist but unable to maintain balance Timed up and go (TUG): 25.22 sec with rollator - 08/14/22 10 meter walk test: 17.44 sec with rollator; Gait speed = 1.88 ft sec with rollator - 08/14/22 Sharlene Motts Balance Scale: 33/56; < 36 high risk for falls (close to 100%) Dynamic Gait Index: 13/24, Scores of 19 or less are predictive of falls in older community living adults - 08/14/22  09/04/22 Berg: 40/56; 37-45 significant risk for fall (>80%)   09/07/22 TUG: 17 seconds with rollator  09/24/22: : 18.75 sec with rollator Gait speed: 1.75 ft /sec with rollator DGI: 14/24: Scores of 19 or less are predictive of falls in older community living adults  5xSTS: 19.25 sec w/o need for UE assist; 14.19 sec w/ B UE assist  PATIENT SURVEYS:  ABC scale 520 / 1600 = 32.5 %   TODAY'S TREATMENT:    09/24/22 THERAPEUTIC EXERCISE: to improve flexibility, strength and mobility.  Demonstration, verbal and tactile cues throughout for technique.  NuStep - L5 x 6 min Standing R/L hip ABD with looped RTB at ankles x 10 bil; UE support on locked rollator for balance Standing R/L hip extension with looped RTB at ankles x 10 bil; UE support on locked rollator for balance Standing R/L hip flexion march with looped RTB at midfeet x 10 bil; UE support on locked rollator for  balance  THERAPEUTIC ACTIVITIES: : 18.75 sec with rollator Gait speed: 1.71 ft /sec with rollator DGI: 14/24: Scores of 19 or less are predictive of falls in older community living adults  5xSTS: 19.25 sec w/o need for UE assist; 14.19 sec w/ B UE assist   09/11/22 THERAPEUTIC EXERCISE: to improve flexibility, strength and mobility.  Demonstration, verbal and tactile cues throughout for technique.  Rec Bike - L1 x 6 min  NEUROMUSCULAR RE-EDUCATION: To improve posture, balance, proprioception, coordination, reduce fall risk, and amplitude of movement.  Standing 5-way star to taps to colored dots x 5; single UE support on back of chair Weight shifts ant/post in staggered stance x 10 with each foot fwd Lateral weight shifts in wide BOS x 10 bil B side-stepping 3 x 10 ft along counter with light UE support on counter and SBA of PT Fwd/back tandem gait 2 x 10 ft along counter with single R UE support on counter and SBA/CGA of PT Retro gait 2 x 10 ft along counter with single UE support on counter and SBA/CGA of PT   09/07/22 THERAPEUTIC EXERCISE: to improve flexibility, strength and mobility.  Demonstration, verbal and tactile cues throughout for technique.  Gait for 270 ft with RW- flexed posture cues for upright posture  TUG: 17 seconds with RW Seated LAQ x 10 2# Seated march 2# x 10  Standing TKE with ball on wall x 10 bil Standing toe raise against wall x 10 bil Sit to stand x 10 focusing on reaching for arm rest before standing and when sitting Standing hip extension x 10 bil Standing hip abduction x 10 bil   PATIENT EDUCATION:  Education details: progress with PT, ongoing PT POC, HEP progression - standing hip exercises, transfer safety, and gait safety with rollator   Person educated: Patient Education method: Explanation, Demonstration, and Verbal cues Education comprehension: verbalized understanding, returned demonstration, verbal cues required, and needs further  education  HOME EXERCISE PROGRAM: Access Code: [email or web address] URL: [email or web address] Date: 09/24/2022 Prepared by: Glenetta Hew  Exercises - Seated Thoracic Lumbar Extension  - 1 x daily -  7 x weekly - 2 sets - 10 reps - 3 sec hold - Seated Scapular Retraction with External Rotation  - 1 x daily - 7 x weekly - 2 sets - 10 reps - 3 sec hold - Seated Hip Abduction with Resistance  - 1 x daily - 7 x weekly - 2 sets - 10 reps - 3-5 sec hold - Seated March with Resistance  - 1 x daily - 3 x weekly - 2 sets - 10 reps - 3 sec hold - Seated Ankle Dorsiflexion with Resistance  - 1 x daily - 7 x weekly - 2 sets - 10 reps - 3 sec hold - Long Sitting Upper Back Extension Press  - 1 x daily - 7 x weekly - 2 sets - 10 reps - 3 sec hold - Seated Shoulder Row with Anchored Resistance  - 1 x daily - 7 x weekly - 2 sets - 10 reps - 3-5 hold hold - Standing Hip Extension with Resistance at Ankles and Counter Support  - 1 x daily - 3 x weekly - 2 sets - 10 reps - 3 sec hold - Marching with Resistance  - 1 x daily - 3 x weekly - 2 sets - 10 reps - 3 sec hold   ASSESSMENT:  CLINICAL IMPRESSION: Cory "Ray" reports he feels like he is improving with PT but continues to feel limited with gait stating "seems like my feet don't work as well as they did".  He persists with a forward flexed posture with rollator held at arms length resulting in poor rollator proximity and decreased hip and knee resulting in shuffling gait - able to correct with verbal cues, however limited carryover when not being reminded.  Progress noted in ability to complete 5xSTS without UE assist or LOB (LTG #3 met) however time both with and without UE assist > time on initial testing with UE assist.  revealing slightly decreased gait speed today, however DGI improved by 1 point to 14/24.  Ray demonstrating progress with PT however unlikely to meet goals in time remaining in current POC, therefore will extend current episode for  additional 2x/wk x up to 6 weeks.  He will benefit from continued skilled PT to address ongoing strength, posture and balance deficits to improve mobility and activity tolerance with decreased risk for falls.   OBJECTIVE IMPAIRMENTS: Abnormal gait, decreased activity tolerance, decreased balance, decreased coordination, decreased knowledge of condition, decreased knowledge of use of DME, decreased mobility, difficulty walking, decreased ROM, decreased strength, decreased safety awareness, impaired perceived functional ability, impaired flexibility, impaired sensation, improper body mechanics, postural dysfunction, and pain.   ACTIVITY LIMITATIONS: carrying, lifting, bending, standing, squatting, stairs, transfers, bed mobility, locomotion level, and caring for others  PARTICIPATION LIMITATIONS: meal prep, cleaning, laundry, driving, community activity, and yard work  PERSONAL FACTORS: Age, Fitness, Past/current experiences, Time since onset of injury/illness/exacerbation, and 3+ comorbidities: Paroxysmal A-fib, chronic DHF, heart murmur, generalized OA, RA, COPD/emphysema, HTN, HLD, CKD, R elbow/olecranon fracture 2019, RLS  are also affecting patient's functional outcome.   REHAB POTENTIAL: Good  CLINICAL DECISION MAKING: Evolving/moderate complexity  EVALUATION COMPLEXITY: Moderate   GOALS: Goals reviewed with patient? Yes  SHORT TERM GOALS: Target date: 09/05/2022  Patient will be independent with initial HEP to improve outcomes and carryover.  Baseline: TBD Goal status: MET  08/22/22    2.  Patient will be educated on strategies to decrease risk of falls.  Baseline:  Goal status: MET  09/07/22  3.  Patient will demonstrate decreased TUG time to </= 20 sec to decrease risk for falls with transitional mobility. Baseline: 25.22 sec with rollator  Goal status: MET  09/07/22 - 17 sec with rollator  LONG TERM GOALS: Target date: 10/03/2022, extended to 11/05/2022  Patient will be independent  with advanced/ongoing HEP to facilitate ability to maintain/progress functional gains from skilled physical therapy services. Baseline: Goal status: IN PROGRESS  09/24/22 - HEP updated today  2.  Patient will be able to step up/down curb safely with LRAD for safety with community ambulation and increased ease of access to sunken room in home.  Baseline:  Goal status: IN PROGRESS   3.  Patient will demonstrate improved functional LE strength as demonstrated by ability to independently achieve sit to stand without UE assist or loss of balance. Baseline: Requires B UE assist for majority of sit to stand; w/o UE assist, he requires increased momentum with LOB observed upon reaching standing Goal status: MET  09/24/22  4.  Patient will improve 5x STS time to </= 14.8 seconds w/o UE assist for improved efficiency and safety with transfers per age-matched norms. Baseline: 13.72 sec with B UE assist; able to complete single STS w/o UE assist but unable to maintain balance Goal status: IN PROGRESS  09/24/22 - 19.25 sec w/o need for UE assist (no LOB); 14.19 sec w/ B UE assist  5.  Patient will demonstrate gait speed of >/= 2.62 ft/sec (0.8 m/s) to be a safe community ambulator with decreased risk for recurrent falls.  Baseline: 1.88 ft/sec with rollator Goal status: IN PROGRESS  09/24/22 - 1.75 ft/sec with rollator  6.  Patient will improve Berg score to >/= 41/56 to improve safety and stability with ADLs in standing and reduce risk for falls. (MCID= 8 points)  Baseline: 33/56  (43/56 as of DC from PT in 2019) Goal status: IN PROGRESS  09/04/22 - Berg: 40/56  7.  Patient will demonstrate at least 19/24 on DGI to improve gait stability and reduce risk for falls. Baseline: 13/24 on 08/14/22  (20/24 as of DC from PT in 2019) Goal status: IN PROGRESS  09/24/22 - DGI: 14/24  8  Patient will report >/= 50% on ABC scale to demonstrate improved functional ability. Baseline: 520 / 1600 = 32.5 % Goal status: IN  PROGRESS   PLAN:  PT FREQUENCY: 1-2x/week, starting at 2x/wk, but reducing frequency to 1x/wk if too overwhelmed  PT DURATION: 6 weeks  PLANNED INTERVENTIONS: Therapeutic exercises, Therapeutic activity, Neuromuscular re-education, Balance training, Gait training, Patient/Family education, Self Care, Joint mobilization, Stair training, DME instructions, Dry Needling, Electrical stimulation, Cryotherapy, Moist heat, Taping, Ultrasound, Manual therapy, and Re-evaluation  PLAN FOR NEXT SESSION:  postural and core/LE strengthening working towards more standing exercises/activities; balance training, possibly integrating components of [place] fall prevention program  Marry Guan, [place] 09/24/22 6:23 PM

## 2022-09-26 ENCOUNTER — Ambulatory Visit: Payer: Medicare Other

## 2022-09-26 DIAGNOSIS — R296 Repeated falls: Secondary | ICD-10-CM

## 2022-09-26 DIAGNOSIS — R2681 Unsteadiness on feet: Secondary | ICD-10-CM

## 2022-09-26 DIAGNOSIS — R2689 Other abnormalities of gait and mobility: Secondary | ICD-10-CM | POA: Diagnosis not present

## 2022-09-26 DIAGNOSIS — M6281 Muscle weakness (generalized): Secondary | ICD-10-CM | POA: Diagnosis not present

## 2022-09-26 NOTE — Therapy (Signed)
OUTPATIENT PHYSICAL THERAPY TREATMENT     Patient Name: Cory Gonzalez MRN: [id number] DOB:1929-10-15, 89 y.o., male Today's Date: 09/26/2022   END OF SESSION:  PT End of Session - 09/26/22 1453     Visit Number 11    Date for PT Re-Evaluation 11/05/22    Authorization Type Medicare & BCBS    PT Start Time 1449    PT Stop Time 1528    PT Time Calculation (min) 39 min    Activity Tolerance Patient tolerated treatment well    Behavior During Therapy WFL for tasks assessed/performed                   Past Medical History:  Diagnosis Date   Chronic diastolic heart failure (HCC)    Dysrhythmia    A-fib   FUO (fever of unknown origin) 07/27/2019   Heart murmur    since childhood, never has had any problems   Hypertension    Paroxysmal atrial fibrillation (HCC)    Pneumonia    PONV (postoperative nausea and vomiting)    a little nausea   Rheumatoid arthritis (HCC)    Tick bite 07/27/2019   Past Surgical History:  Procedure Laterality Date   CATARACT EXTRACTION Bilateral    COLONOSCOPY     EXCISION MASS HEAD Left 03/29/2022   Procedure: EXCISION OF EAR MOH'S DEFECT;  Surgeon: Scarlette Ar, MD;  Location: [facility] OR;  Service: ENT;  Laterality: Left;   NASAL FLAP ROTATION Left 03/29/2022   Procedure: POSSIBLE FLAP ROTATION; POSSIBLE EAR CARTILAGE GRAFT;  Surgeon: Scarlette Ar, MD;  Location: [facility] OR;  Service: ENT;  Laterality: Left;   ORIF ELBOW FRACTURE Right 09/18/2017   Procedure: OPEN REDUCTION INTERNAL FIXATION (ORIF) RIGHT ELBOW/OLECRANON FRACTURE;  Surgeon: Eldred Manges, MD;  Location: [facility] OR;  Service: Orthopedics;  Laterality: Right;   PROSTATE SURGERY     SKIN FULL THICKNESS GRAFT Left 03/29/2022   Procedure: ADJACENT TISSUE TRANSFER;  SKIN GRAFT;  Surgeon: Scarlette Ar, MD;  Location: [facility] OR;  Service: ENT;  Laterality: Left;   Patient Active Problem List   Diagnosis Date Noted   FUO (fever of unknown origin) 07/27/2019   Tick bite 07/27/2019   Pneumothorax  on right 09/19/2017   Olecranon fracture 09/18/2017   Blepharitis of upper and lower eyelids of both eyes 04/19/2016   Pseudophakia of both eyes 04/19/2016   TB lung, latent 06/05/2011   Ocular hypertension, 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   Rheumatoid arthritis (HCC) 02/16/2004    PCP: [facility]   REFERRING PROVIDER:  Georgianne Fick, MD   REFERRING DIAG: R26.9 (ICD-10-CM) - Gait abnormality    THERAPY DIAG:  Repeated falls  Unsteadiness on feet  Other abnormalities of gait and mobility  Muscle weakness (generalized)  RATIONALE FOR EVALUATION AND TREATMENT: Rehabilitation  ONSET DATE: Progressive decline over past few years  NEXT MD VISIT: 02/20/23   SUBJECTIVE:  SUBJECTIVE STATEMENT: Pt reports denies pain today. No recent falls  Pt accompanied by: significant other - wife - [name] (in waiting room)  PAIN: Are you having pain? No   PERTINENT HISTORY:  Paroxysmal A-fib, chronic DHF, heart murmur, generalized OA, RA, COPD/emphysema, HTN, HLD, CKD, R elbow/olecranon fracture 2019, RLS  PRECAUTIONS: Fall  WEIGHT BEARING RESTRICTIONS: No  FALLS:  Has patient fallen in last 6 months? Yes. Number of falls >6  LIVING ENVIRONMENT: Lives with: lives with their spouse Lives in: House/apartment Stairs: Yes: Internal: 1 steps; sunken room and External: 1 steps; ramp Has following equipment at home: Single point cane, Walker - 2 wheeled, Environmental consultant - 4 wheeled, Wheelchair (manual), Ramped entry, and transport chair  OCCUPATION: Retired  PLOF: Independent and Leisure: most sedentary  PATIENT GOALS: "To do better with my balance, my walking and getting up out of a chair."   OBJECTIVE: (objective measures completed at initial evaluation unless otherwise  dated)  DIAGNOSTIC FINDINGS:  03/13/22 - CT cervical spine s/p fall: IMPRESSION: 1. No evidence of acute fracture to the cervical spine. 2. Nonspecific straightening of the expected cervical lordosis. 3. Grade 1 anterolisthesis at C2-C3, C3-C4, C6-C7 and T1-T2. 4. Cervical spondylosis, as described.  03/13/22 Head CT s/p fall with trauma to the head and face: IMPRESSION:  No acute or traumatic finding. Age related volume loss and chronic small-vessel ischemic change of the white matter.  COGNITION: Overall cognitive status: Within functional limits for tasks assessed   SENSATION: B LE RLS  COORDINATION: Slowed reciprocal movements  MUSCLE LENGTH: (TBA as indicated) Hamstrings:  ITB:  Piriformis:  Hip flexors:  Quads:  Heelcord:   POSTURE:  rounded shoulders, forward head, decreased lumbar lordosis, increased thoracic kyphosis, flexed trunk , and significant kyphoscoliosis  LOWER EXTREMITY MMT:  (tested in sitting on eval and 09/04/22)  MMT Right Eval Left Eval R 09/04/22 L 09/04/22  Hip flexion 4- 4- 4- 4-  Hip extension 4 4- 4 4-  Hip abduction 4+ 4 4+ 4+  Hip adduction 5 4+ 5 5  Hip internal rotation 4+ 4- 4+ 4+  Hip external rotation 4- 4- 4 4  Knee flexion 4 4- 4+ 4+  Knee extension 4+ 4 4+ 4+  Ankle dorsiflexion 3+ 3+ 4- 4  Ankle plantarflexion      Ankle inversion      Ankle eversion      (Blank rows = not tested)  BED MOBILITY:  NT  TRANSFERS: Assistive device utilized: Environmental consultant - 4 wheeled  Sit to stand: SBA and CGA - CGA when performing without UE assist Stand to sit: SBA Chair to chair: SBA Floor:  NT  GAIT: Distance walked: 80 ft Assistive device utilized: Environmental consultant - 4 wheeled Level of assistance: Modified independence and SBA Gait pattern: step through pattern, decreased stride length, decreased hip/knee flexion- Right, decreased hip/knee flexion- Left, and trunk flexed Comments:  RAMP: Level of Assistance:  NT Assistive device utilized: NT Ramp  Comments:   CURB:  Level of Assistance:  NT Assistive device utilized:  NT Curb Comments:   STAIRS:  Level of Assistance: NT  Stair Negotiation Technique:   Number of Stairs:    Height of Stairs:   Comments:   FUNCTIONAL TESTS:  5 times sit to stand: 13.72 sec with B UE assist; able to complete single STS w/o UE assist but unable to maintain balance Timed up and go (TUG): 25.22 sec with rollator - 08/14/22 10 meter walk test: 17.44 sec with rollator; Gait  speed = 1.88 ft sec with rollator - 08/14/22 Berg Balance Scale: 33/56; < 36 high risk for falls (close to 100%) Dynamic Gait Index: 13/24, Scores of 19 or less are predictive of falls in older community living adults - 08/14/22  09/04/22 Berg: 40/56; 37-45 significant risk for fall (>80%)   09/07/22 TUG: 17 seconds with rollator  09/24/22: : 18.75 sec with rollator Gait speed: 1.75 ft /sec with rollator DGI: 14/24: Scores of 19 or less are predictive of falls in older community living adults  5xSTS: 19.25 sec w/o need for UE assist; 14.19 sec w/ B UE assist  PATIENT SURVEYS:  ABC scale 520 / 1600 = 32.5 %   TODAY'S TREATMENT:   09/26/22 THERAPEUTIC EXERCISE: to improve flexibility, strength and mobility.  Demonstration, verbal and tactile cues throughout for technique.  NuStep - L5 x 6 min Standing R/L hip ABD with looped RTB at ankles x 10 bil; UE support on back of chair Standing R/L hip extension with looped RTB at ankles x 10 bil; UE support on back of chair Standing R/L hip flexion march with looped RTB at midfeet x 10 bil; UE support on back of chair Standing heel raises 2x10 UE support on chair Standing bird dog x 10 bil Knee extension 10# x15 BLE Knee flexion 15# BLE Rows 15# 2x10  09/24/22 THERAPEUTIC EXERCISE: to improve flexibility, strength and mobility.  Demonstration, verbal and tactile cues throughout for technique.  NuStep - L5 x 6 min Standing R/L hip ABD with looped RTB at ankles x 10 bil; UE  support on locked rollator for balance Standing R/L hip extension with looped RTB at ankles x 10 bil; UE support on locked rollator for balance Standing R/L hip flexion march with looped RTB at midfeet x 10 bil; UE support on locked rollator for balance  THERAPEUTIC ACTIVITIES: : 18.75 sec with rollator Gait speed: 1.71 ft /sec with rollator DGI: 14/24: Scores of 19 or less are predictive of falls in older community living adults  5xSTS: 19.25 sec w/o need for UE assist; 14.19 sec w/ B UE assist   09/11/22 THERAPEUTIC EXERCISE: to improve flexibility, strength and mobility.  Demonstration, verbal and tactile cues throughout for technique.  Rec Bike - L1 x 6 min  NEUROMUSCULAR RE-EDUCATION: To improve posture, balance, proprioception, coordination, reduce fall risk, and amplitude of movement.  Standing 5-way star to taps to colored dots x 5; single UE support on back of chair Weight shifts ant/post in staggered stance x 10 with each foot fwd Lateral weight shifts in wide BOS x 10 bil B side-stepping 3 x 10 ft along counter with light UE support on counter and SBA of PT Fwd/back tandem gait 2 x 10 ft along counter with single R UE support on counter and SBA/CGA of PT Retro gait 2 x 10 ft along counter with single UE support on counter and SBA/CGA of PT   09/07/22 THERAPEUTIC EXERCISE: to improve flexibility, strength and mobility.  Demonstration, verbal and tactile cues throughout for technique.  Gait for 270 ft with RW- flexed posture cues for upright posture  TUG: 17 seconds with RW Seated LAQ x 10 2# Seated march 2# x 10  Standing TKE with ball on wall x 10 bil Standing toe raise against wall x 10 bil Sit to stand x 10 focusing on reaching for arm rest before standing and when sitting Standing hip extension x 10 bil Standing hip abduction x 10 bil   PATIENT EDUCATION:  Education  details: progress with PT, ongoing PT POC, HEP progression - standing hip exercises, transfer  safety, and gait safety with rollator   Person educated: Patient Education method: Explanation, Demonstration, and Verbal cues Education comprehension: verbalized understanding, returned demonstration, verbal cues required, and needs further education  HOME EXERCISE PROGRAM: Access Code: [email or web address] URL: [email or web address] Date: 09/24/2022 Prepared by: Glenetta Hew  Exercises - Seated Thoracic Lumbar Extension  - 1 x daily - 7 x weekly - 2 sets - 10 reps - 3 sec hold - Seated Scapular Retraction with External Rotation  - 1 x daily - 7 x weekly - 2 sets - 10 reps - 3 sec hold - Seated Hip Abduction with Resistance  - 1 x daily - 7 x weekly - 2 sets - 10 reps - 3-5 sec hold - Seated March with Resistance  - 1 x daily - 3 x weekly - 2 sets - 10 reps - 3 sec hold - Seated Ankle Dorsiflexion with Resistance  - 1 x daily - 7 x weekly - 2 sets - 10 reps - 3 sec hold - Long Sitting Upper Back Extension Press  - 1 x daily - 7 x weekly - 2 sets - 10 reps - 3 sec hold - Seated Shoulder Row with Anchored Resistance  - 1 x daily - 7 x weekly - 2 sets - 10 reps - 3-5 hold hold - Standing Hip Extension with Resistance at Ankles and Counter Support  - 1 x daily - 3 x weekly - 2 sets - 10 reps - 3 sec hold - Marching with Resistance  - 1 x daily - 3 x weekly - 2 sets - 10 reps - 3 sec hold   ASSESSMENT:  CLINICAL IMPRESSION: Advanced therex to tolerance. Provided cues as needed with exercises to correct form and target the correct muscles. Needs a lot of postural correction with exercises as well. Seated breaks required throughout session for recovery.  Ray demonstrating progress with PT but continues tyo show benefit from continued services.   OBJECTIVE IMPAIRMENTS: Abnormal gait, decreased activity tolerance, decreased balance, decreased coordination, decreased knowledge of condition, decreased knowledge of use of DME, decreased mobility, difficulty walking, decreased ROM, decreased  strength, decreased safety awareness, impaired perceived functional ability, impaired flexibility, impaired sensation, improper body mechanics, postural dysfunction, and pain.   ACTIVITY LIMITATIONS: carrying, lifting, bending, standing, squatting, stairs, transfers, bed mobility, locomotion level, and caring for others  PARTICIPATION LIMITATIONS: meal prep, cleaning, laundry, driving, community activity, and yard work  PERSONAL FACTORS: Age, Fitness, Past/current experiences, Time since onset of injury/illness/exacerbation, and 3+ comorbidities: Paroxysmal A-fib, chronic DHF, heart murmur, generalized OA, RA, COPD/emphysema, HTN, HLD, CKD, R elbow/olecranon fracture 2019, RLS  are also affecting patient's functional outcome.   REHAB POTENTIAL: Good  CLINICAL DECISION MAKING: Evolving/moderate complexity  EVALUATION COMPLEXITY: Moderate   GOALS: Goals reviewed with patient? Yes  SHORT TERM GOALS: Target date: 09/05/2022  Patient will be independent with initial HEP to improve outcomes and carryover.  Baseline: TBD Goal status: MET  08/22/22    2.  Patient will be educated on strategies to decrease risk of falls.  Baseline:  Goal status: MET  09/07/22  3.  Patient will demonstrate decreased TUG time to </= 20 sec to decrease risk for falls with transitional mobility. Baseline: 25.22 sec with rollator  Goal status: MET  09/07/22 - 17 sec with rollator  LONG TERM GOALS: Target date: 10/03/2022, extended to 11/05/2022  Patient will be independent with advanced/ongoing HEP to  facilitate ability to maintain/progress functional gains from skilled physical therapy services. Baseline: Goal status: IN PROGRESS  09/24/22 - HEP updated today  2.  Patient will be able to step up/down curb safely with LRAD for safety with community ambulation and increased ease of access to sunken room in home.  Baseline:  Goal status: IN PROGRESS   3.  Patient will demonstrate improved functional LE strength as  demonstrated by ability to independently achieve sit to stand without UE assist or loss of balance. Baseline: Requires B UE assist for majority of sit to stand; w/o UE assist, he requires increased momentum with LOB observed upon reaching standing Goal status: MET  09/24/22  4.  Patient will improve 5x STS time to </= 14.8 seconds w/o UE assist for improved efficiency and safety with transfers per age-matched norms. Baseline: 13.72 sec with B UE assist; able to complete single STS w/o UE assist but unable to maintain balance Goal status: IN PROGRESS  09/24/22 - 19.25 sec w/o need for UE assist (no LOB); 14.19 sec w/ B UE assist  5.  Patient will demonstrate gait speed of >/= 2.62 ft/sec (0.8 m/s) to be a safe community ambulator with decreased risk for recurrent falls.  Baseline: 1.88 ft/sec with rollator Goal status: IN PROGRESS  09/24/22 - 1.75 ft/sec with rollator  6.  Patient will improve Berg score to >/= 41/56 to improve safety and stability with ADLs in standing and reduce risk for falls. (MCID= 8 points)  Baseline: 33/56  (43/56 as of DC from PT in 2019) Goal status: IN PROGRESS  09/04/22 - Berg: 40/56  7.  Patient will demonstrate at least 19/24 on DGI to improve gait stability and reduce risk for falls. Baseline: 13/24 on 08/14/22  (20/24 as of DC from PT in 2019) Goal status: IN PROGRESS  09/24/22 - DGI: 14/24  8  Patient will report >/= 50% on ABC scale to demonstrate improved functional ability. Baseline: 520 / 1600 = 32.5 % Goal status: IN PROGRESS   PLAN:  PT FREQUENCY: 1-2x/week, starting at 2x/wk, but reducing frequency to 1x/wk if too overwhelmed  PT DURATION: 6 weeks  PLANNED INTERVENTIONS: Therapeutic exercises, Therapeutic activity, Neuromuscular re-education, Balance training, Gait training, Patient/Family education, Self Care, Joint mobilization, Stair training, DME instructions, Dry Needling, Electrical stimulation, Cryotherapy, Moist heat, Taping, Ultrasound, Manual  therapy, and Re-evaluation  PLAN FOR NEXT SESSION:  postural and core/LE strengthening working towards more standing exercises/activities; balance training, possibly integrating components of [place] fall prevention program  Darleene Cleaver, PTA 09/26/22 4:26 PM

## 2022-10-02 ENCOUNTER — Encounter: Payer: Self-pay | Admitting: Physical Therapy

## 2022-10-02 ENCOUNTER — Ambulatory Visit: Payer: Medicare Other | Attending: Internal Medicine | Admitting: Physical Therapy

## 2022-10-02 DIAGNOSIS — R2681 Unsteadiness on feet: Secondary | ICD-10-CM | POA: Diagnosis not present

## 2022-10-02 DIAGNOSIS — R2689 Other abnormalities of gait and mobility: Secondary | ICD-10-CM | POA: Insufficient documentation

## 2022-10-02 DIAGNOSIS — M6281 Muscle weakness (generalized): Secondary | ICD-10-CM | POA: Insufficient documentation

## 2022-10-02 DIAGNOSIS — R296 Repeated falls: Secondary | ICD-10-CM | POA: Insufficient documentation

## 2022-10-02 NOTE — Therapy (Signed)
OUTPATIENT PHYSICAL THERAPY TREATMENT     Patient Name: Cory Gonzalez MRN: [id number] DOB:1930-01-26, 89 y.o., male Today's Date: 10/02/2022   END OF SESSION:  PT End of Session - 10/02/22 0930     Visit Number 12    Date for PT Re-Evaluation 11/05/22    Authorization Type Medicare & BCBS    PT Start Time 0930    PT Stop Time 1014    PT Time Calculation (min) 44 min    Activity Tolerance Patient tolerated treatment well    Behavior During Therapy WFL for tasks assessed/performed                    Past Medical History:  Diagnosis Date   Chronic diastolic heart failure (HCC)    Dysrhythmia    A-fib   FUO (fever of unknown origin) 07/27/2019   Heart murmur    since childhood, never has had any problems   Hypertension    Paroxysmal atrial fibrillation (HCC)    Pneumonia    PONV (postoperative nausea and vomiting)    a little nausea   Rheumatoid arthritis (HCC)    Tick bite 07/27/2019   Past Surgical History:  Procedure Laterality Date   CATARACT EXTRACTION Bilateral    COLONOSCOPY     EXCISION MASS HEAD Left 03/29/2022   Procedure: EXCISION OF EAR MOH'S DEFECT;  Surgeon: Scarlette Ar, MD;  Location: [facility] OR;  Service: ENT;  Laterality: Left;   NASAL FLAP ROTATION Left 03/29/2022   Procedure: POSSIBLE FLAP ROTATION; POSSIBLE EAR CARTILAGE GRAFT;  Surgeon: Scarlette Ar, MD;  Location: [facility] OR;  Service: ENT;  Laterality: Left;   ORIF ELBOW FRACTURE Right 09/18/2017   Procedure: OPEN REDUCTION INTERNAL FIXATION (ORIF) RIGHT ELBOW/OLECRANON FRACTURE;  Surgeon: Eldred Manges, MD;  Location: [facility] OR;  Service: Orthopedics;  Laterality: Right;   PROSTATE SURGERY     SKIN FULL THICKNESS GRAFT Left 03/29/2022   Procedure: ADJACENT TISSUE TRANSFER;  SKIN GRAFT;  Surgeon: Scarlette Ar, MD;  [facility];  Service: ENT;  Laterality: Left;   Patient Active Problem List   Diagnosis Date Noted   FUO (fever of unknown origin) 07/27/2019   Tick bite 07/27/2019    Pneumothorax on right 09/19/2017   Olecranon fracture 09/18/2017   Blepharitis of upper and lower eyelids of both eyes 04/19/2016   Pseudophakia of both eyes 04/19/2016   TB lung, latent 06/05/2011   Ocular hypertension, 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   Rheumatoid arthritis (HCC) 02/16/2004    PCP: [facility]   REFERRING PROVIDER:  Georgianne Fick, MD   REFERRING DIAG: R26.9 (ICD-10-CM) - Gait abnormality    THERAPY DIAG:  Repeated falls  Unsteadiness on feet  Other abnormalities of gait and mobility  Muscle weakness (generalized)  RATIONALE FOR EVALUATION AND TREATMENT: Rehabilitation  ONSET DATE: Progressive decline over past few years  NEXT MD VISIT: 02/20/23   SUBJECTIVE:  SUBJECTIVE STATEMENT: Pt reports he is having a bad morning - legs don't feel like they're working and balance is worse.  Pt accompanied by: significant other - wife - [name] (in waiting room)  PAIN: Are you having pain? No   PERTINENT HISTORY:  Paroxysmal A-fib, chronic DHF, heart murmur, generalized OA, RA, COPD/emphysema, HTN, HLD, CKD, R elbow/olecranon fracture 2019, RLS  PRECAUTIONS: Fall  WEIGHT BEARING RESTRICTIONS: No  FALLS:  Has patient fallen in last 6 months? Yes. Number of falls >6  LIVING ENVIRONMENT: Lives with: lives with their spouse Lives in: House/apartment Stairs: Yes: Internal: 1 steps; sunken room and External: 1 steps; ramp Has following equipment at home: Single point cane, Walker - 2 wheeled, Environmental consultant - 4 wheeled, Wheelchair (manual), Ramped entry, and transport chair  OCCUPATION: Retired  PLOF: Independent and Leisure: most sedentary  PATIENT GOALS: "To do better with my balance, my walking and getting up out of a chair."   OBJECTIVE: (objective  measures completed at initial evaluation unless otherwise dated)  DIAGNOSTIC FINDINGS:  03/13/22 - CT cervical spine s/p fall: IMPRESSION: 1. No evidence of acute fracture to the cervical spine. 2. Nonspecific straightening of the expected cervical lordosis. 3. Grade 1 anterolisthesis at C2-C3, C3-C4, C6-C7 and T1-T2. 4. Cervical spondylosis, as described.  03/13/22 Head CT s/p fall with trauma to the head and face: IMPRESSION:  No acute or traumatic finding. Age related volume loss and chronic small-vessel ischemic change of the white matter.  COGNITION: Overall cognitive status: Within functional limits for tasks assessed   SENSATION: B LE RLS  COORDINATION: Slowed reciprocal movements  MUSCLE LENGTH: (TBA as indicated) Hamstrings:  ITB:  Piriformis:  Hip flexors:  Quads:  Heelcord:   POSTURE:  rounded shoulders, forward head, decreased lumbar lordosis, increased thoracic kyphosis, flexed trunk , and significant kyphoscoliosis  LOWER EXTREMITY MMT:  (tested in sitting on eval, 09/04/22 and 10/02/22)  MMT Right Eval Left Eval R 09/04/22 L 09/04/22 R 10/02/22 L 10/02/22  Hip flexion 4- 4- 4- 4- 4 4-  Hip extension 4 4- 4 4- 4 4-  Hip abduction 4+ 4 4+ 4+ 4+ 4+  Hip adduction 5 4+ 5 5 5 5   Hip internal rotation 4+ 4- 4+ 4+ 4+ 4+  Hip external rotation 4- 4- 4 4 4+ 4+  Knee flexion 4 4- 4+ 4+ 5 5  Knee extension 4+ 4 4+ 4+ 5 5  Ankle dorsiflexion 3+ 3+ 4- 4 4 4   Ankle plantarflexion        Ankle inversion        Ankle eversion        (Blank rows = not tested)  BED MOBILITY:  NT  TRANSFERS: Assistive device utilized: Environmental consultant - 4 wheeled  Sit to stand: SBA and CGA - CGA when performing without UE assist Stand to sit: SBA Chair to chair: SBA Floor:  NT  GAIT: Distance walked: 80 ft Assistive device utilized: Environmental consultant - 4 wheeled Level of assistance: Modified independence and SBA Gait pattern: step through pattern, decreased stride length, decreased hip/knee flexion- Right,  decreased hip/knee flexion- Left, and trunk flexed Comments:  RAMP: Level of Assistance:  NT Assistive device utilized: NT Ramp Comments:   CURB:  Level of Assistance:  NT Assistive device utilized:  NT Curb Comments:   STAIRS:  Level of Assistance: NT  Stair Negotiation Technique:   Number of Stairs:    Height of Stairs:   Comments:   FUNCTIONAL TESTS:  5 times sit to  stand: 13.72 sec with B UE assist; able to complete single STS w/o UE assist but unable to maintain balance Timed up and go (TUG): 25.22 sec with rollator - 08/14/22 10 meter walk test: 17.44 sec with rollator; Gait speed = 1.88 ft sec with rollator - 08/14/22 Sharlene Motts Balance Scale: 33/56; < 36 high risk for falls (close to 100%) Dynamic Gait Index: 13/24, Scores of 19 or less are predictive of falls in older community living adults - 08/14/22  09/04/22 Berg: 40/56; 37-45 significant risk for fall (>80%)   09/07/22 TUG: 17 seconds with rollator  09/24/22: : 18.75 sec with rollator Gait speed: 1.75 ft /sec with rollator DGI: 14/24: Scores of 19 or less are predictive of falls in older community living adults  5xSTS: 19.25 sec w/o need for UE assist; 14.19 sec w/ B UE assist  PATIENT SURVEYS:  ABC scale 520 / 1600 = 32.5 %   TODAY'S TREATMENT:    10/02/22 THERAPEUTIC EXERCISE: to improve flexibility, strength and mobility.  Demonstration, verbal and tactile cues throughout for technique.  NuStep - L5 x 6 min Standing R/L hip extension with looped RTB at ankles x 10 bil, with looped GTB at ankles x 10 bil; UE support on locked rollator for balance - pt substituting with knee flexion when attempted with GTB Trunk extension roll-up + scap retraction & long-arm ER into pool noodle on wall 2 x 10 Lumbar extension with forearms/hands on wall x 10 Standing YTB scap retraction + rows x 10, SBA of PT for balance Standing YTB scap retraction + shoulder extension x 10, SBA of PT for balance Standing bird dog x 10 bil  with UE support on back of chair - pt having difficulty coordinating reciprocal arm and leg motions Mini-squat with chair behind for safety 2 x 10 - focusing on full trunk and hip extension on return to stand  THERAPEUTIC ACTIVITIES: LE MMT   09/26/22 THERAPEUTIC EXERCISE: to improve flexibility, strength and mobility.  Demonstration, verbal and tactile cues throughout for technique.  NuStep - L5 x 6 min Standing R/L hip ABD with looped RTB at ankles x 10 bil; UE support on back of chair Standing R/L hip extension with looped RTB at ankles x 10 bil; UE support on back of chair Standing R/L hip flexion march with looped RTB at midfeet x 10 bil; UE support on back of chair Standing heel raises 2x10 UE support on chair Standing bird dog x 10 bil Knee extension 10# x15 BLE Knee flexion 15# BLE Rows 15# 2x10   09/24/22 THERAPEUTIC EXERCISE: to improve flexibility, strength and mobility.  Demonstration, verbal and tactile cues throughout for technique.  NuStep - L5 x 6 min Standing R/L hip ABD with looped RTB at ankles x 10 bil; UE support on locked rollator for balance Standing R/L hip extension with looped RTB at ankles x 10 bil; UE support on locked rollator for balance Standing R/L hip flexion march with looped RTB at midfeet x 10 bil; UE support on locked rollator for balance  THERAPEUTIC ACTIVITIES: : 18.75 sec with rollator Gait speed: 1.71 ft /sec with rollator DGI: 14/24: Scores of 19 or less are predictive of falls in older community living adults  5xSTS: 19.25 sec w/o need for UE assist; 14.19 sec w/ B UE assist   PATIENT EDUCATION:  Education details: progress with PT, ongoing PT POC, and HEP resistance clarification (RTB for standing hip exercises)   Person educated: Patient Education method: Explanation, Demonstration, and Verbal  cues Education comprehension: verbalized understanding, returned demonstration, verbal cues required, and needs further education  HOME  EXERCISE PROGRAM: Access Code: [email or web address] URL: [email or web address] Date: 09/24/2022 Prepared by: Glenetta Hew  Exercises - Seated Thoracic Lumbar Extension  - 1 x daily - 7 x weekly - 2 sets - 10 reps - 3 sec hold - Seated Scapular Retraction with External Rotation  - 1 x daily - 7 x weekly - 2 sets - 10 reps - 3 sec hold - Seated Hip Abduction with Resistance  - 1 x daily - 7 x weekly - 2 sets - 10 reps - 3-5 sec hold - Seated March with Resistance  - 1 x daily - 3 x weekly - 2 sets - 10 reps - 3 sec hold - Seated Ankle Dorsiflexion with Resistance  - 1 x daily - 7 x weekly - 2 sets - 10 reps - 3 sec hold - Long Sitting Upper Back Extension Press  - 1 x daily - 7 x weekly - 2 sets - 10 reps - 3 sec hold - Seated Shoulder Row with Anchored Resistance  - 1 x daily - 7 x weekly - 2 sets - 10 reps - 3-5 hold hold - Standing Hip Extension with Resistance at Ankles and Counter Support  - 1 x daily - 3 x weekly - 2 sets - 10 reps - 3 sec hold - Marching with Resistance  - 1 x daily - 3 x weekly - 2 sets - 10 reps - 3 sec hold   ASSESSMENT:  CLINICAL IMPRESSION: Rydell "Ray" reports increased fatigue today but still able to participate with several standing exercises during therapy session with intermittent seated rest breaks.  He continues to have difficulty with hip and trunk extension posture when upright, however able to partially correct during exercises.  Overall LE strength improving with remaining weakness most pronounced in bilateral hip flexion and extension, with extension weakness likely contributing to limited posture along with continued hip flexor tightness.  Tivon "Ray" will benefit from continued skilled PT to address above deficits to improve mobility and activity tolerance with decreased pain interference.   OBJECTIVE IMPAIRMENTS: Abnormal gait, decreased activity tolerance, decreased balance, decreased coordination, decreased knowledge of condition, decreased  knowledge of use of DME, decreased mobility, difficulty walking, decreased ROM, decreased strength, decreased safety awareness, impaired perceived functional ability, impaired flexibility, impaired sensation, improper body mechanics, postural dysfunction, and pain.   ACTIVITY LIMITATIONS: carrying, lifting, bending, standing, squatting, stairs, transfers, bed mobility, locomotion level, and caring for others  PARTICIPATION LIMITATIONS: meal prep, cleaning, laundry, driving, community activity, and yard work  PERSONAL FACTORS: Age, Fitness, Past/current experiences, Time since onset of injury/illness/exacerbation, and 3+ comorbidities: Paroxysmal A-fib, chronic DHF, heart murmur, generalized OA, RA, COPD/emphysema, HTN, HLD, CKD, R elbow/olecranon fracture 2019, RLS  are also affecting patient's functional outcome.   REHAB POTENTIAL: Good  CLINICAL DECISION MAKING: Evolving/moderate complexity  EVALUATION COMPLEXITY: Moderate   GOALS: Goals reviewed with patient? Yes  SHORT TERM GOALS: Target date: 09/05/2022  Patient will be independent with initial HEP to improve outcomes and carryover.  Baseline: TBD Goal status: MET  08/22/22    2.  Patient will be educated on strategies to decrease risk of falls.  Baseline:  Goal status: MET  09/07/22  3.  Patient will demonstrate decreased TUG time to </= 20 sec to decrease risk for falls with transitional mobility. Baseline: 25.22 sec with rollator  Goal status: MET  09/07/22 - 17 sec with rollator  LONG  TERM GOALS: Target date: 10/03/2022, extended to 11/05/2022  Patient will be independent with advanced/ongoing HEP to facilitate ability to maintain/progress functional gains from skilled physical therapy services. Baseline: Goal status: IN PROGRESS  09/24/22 - HEP updated today  2.  Patient will be able to step up/down curb safely with LRAD for safety with community ambulation and increased ease of access to sunken room in home.  Baseline:  Goal  status: IN PROGRESS   3.  Patient will demonstrate improved functional LE strength as demonstrated by ability to independently achieve sit to stand without UE assist or loss of balance. Baseline: Requires B UE assist for majority of sit to stand; w/o UE assist, he requires increased momentum with LOB observed upon reaching standing Goal status: MET  09/24/22  4.  Patient will improve 5x STS time to </= 14.8 seconds w/o UE assist for improved efficiency and safety with transfers per age-matched norms. Baseline: 13.72 sec with B UE assist; able to complete single STS w/o UE assist but unable to maintain balance Goal status: IN PROGRESS  09/24/22 - 19.25 sec w/o need for UE assist (no LOB); 14.19 sec w/ B UE assist  5.  Patient will demonstrate gait speed of >/= 2.62 ft/sec (0.8 m/s) to be a safe community ambulator with decreased risk for recurrent falls.  Baseline: 1.88 ft/sec with rollator Goal status: IN PROGRESS  09/24/22 - 1.75 ft/sec with rollator  6.  Patient will improve Berg score to >/= 41/56 to improve safety and stability with ADLs in standing and reduce risk for falls. (MCID= 8 points)  Baseline: 33/56  (43/56 as of DC from PT in 2019) Goal status: IN PROGRESS  09/04/22 - Berg: 40/56  7.  Patient will demonstrate at least 19/24 on DGI to improve gait stability and reduce risk for falls. Baseline: 13/24 on 08/14/22  (20/24 as of DC from PT in 2019) Goal status: IN PROGRESS  09/24/22 - DGI: 14/24  8  Patient will report >/= 50% on ABC scale to demonstrate improved functional ability. Baseline: 520 / 1600 = 32.5 % Goal status: IN PROGRESS   PLAN:  PT FREQUENCY: 1-2x/week, starting at 2x/wk, but reducing frequency to 1x/wk if too overwhelmed  PT DURATION: 6 weeks  PLANNED INTERVENTIONS: Therapeutic exercises, Therapeutic activity, Neuromuscular re-education, Balance training, Gait training, Patient/Family education, Self Care, Joint mobilization, Stair training, DME instructions, Dry  Needling, Electrical stimulation, Cryotherapy, Moist heat, Taping, Ultrasound, Manual therapy, and Re-evaluation  PLAN FOR NEXT SESSION:  postural and core/LE strengthening working towards more standing exercises/activities; balance training, possibly integrating components of [place] fall prevention program  Marry Guan, [place] 10/02/22 12:40 PM

## 2022-10-10 ENCOUNTER — Ambulatory Visit: Payer: Medicare Other | Admitting: Physical Therapy

## 2022-10-10 ENCOUNTER — Encounter: Payer: Self-pay | Admitting: Physical Therapy

## 2022-10-10 DIAGNOSIS — R296 Repeated falls: Secondary | ICD-10-CM | POA: Diagnosis not present

## 2022-10-10 DIAGNOSIS — R2689 Other abnormalities of gait and mobility: Secondary | ICD-10-CM

## 2022-10-10 DIAGNOSIS — M6281 Muscle weakness (generalized): Secondary | ICD-10-CM

## 2022-10-10 DIAGNOSIS — R2681 Unsteadiness on feet: Secondary | ICD-10-CM | POA: Diagnosis not present

## 2022-10-10 NOTE — Therapy (Signed)
OUTPATIENT PHYSICAL THERAPY TREATMENT     Patient Name: Cory Gonzalez MRN: [id number] DOB:Feb 25, 1929, 89 y.o., male Today's Date: 10/10/2022   END OF SESSION:  PT End of Session - 10/10/22 1109     Visit Number 13    Date for PT Re-Evaluation 11/05/22    Authorization Type Medicare & BCBS    Progress Note Due on Visit 20    PT Start Time 1109    PT Stop Time 1151    PT Time Calculation (min) 42 min    Activity Tolerance Patient tolerated treatment well    Behavior During Therapy WFL for tasks assessed/performed                    Past Medical History:  Diagnosis Date   Chronic diastolic heart failure (HCC)    Dysrhythmia    A-fib   FUO (fever of unknown origin) 07/27/2019   Heart murmur    since childhood, never has had any problems   Hypertension    Paroxysmal atrial fibrillation (HCC)    Pneumonia    PONV (postoperative nausea and vomiting)    a little nausea   Rheumatoid arthritis (HCC)    Tick bite 07/27/2019   Past Surgical History:  Procedure Laterality Date   CATARACT EXTRACTION Bilateral    COLONOSCOPY     EXCISION MASS HEAD Left 03/29/2022   Procedure: EXCISION OF EAR MOH'S DEFECT;  Surgeon: Scarlette Ar, MD;  Location: [facility] OR;  Service: ENT;  Laterality: Left;   NASAL FLAP ROTATION Left 03/29/2022   Procedure: POSSIBLE FLAP ROTATION; POSSIBLE EAR CARTILAGE GRAFT;  Surgeon: Scarlette Ar, MD;  Location: [facility] OR;  Service: ENT;  Laterality: Left;   ORIF ELBOW FRACTURE Right 09/18/2017   Procedure: OPEN REDUCTION INTERNAL FIXATION (ORIF) RIGHT ELBOW/OLECRANON FRACTURE;  Surgeon: Eldred Manges, MD;  Location: [facility] OR;  Service: Orthopedics;  Laterality: Right;   PROSTATE SURGERY     SKIN FULL THICKNESS GRAFT Left 03/29/2022   Procedure: ADJACENT TISSUE TRANSFER;  SKIN GRAFT;  Surgeon: Scarlette Ar, MD;  Location: [facility];  Service: ENT;  Laterality: Left;   Patient Active Problem List   Diagnosis Date Noted   FUO (fever of unknown origin) 07/27/2019    Tick bite 07/27/2019   Pneumothorax on right 09/19/2017   Olecranon fracture 09/18/2017   Blepharitis of upper and lower eyelids of both eyes 04/19/2016   Pseudophakia of both eyes 04/19/2016   TB lung, latent 06/05/2011   Ocular hypertension, 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   Rheumatoid arthritis (HCC) 02/16/2004    PCP: [facility]   REFERRING PROVIDER:  Georgianne Fick, MD   REFERRING DIAG: R26.9 (ICD-10-CM) - Gait abnormality    THERAPY DIAG:  Repeated falls  Unsteadiness on feet  Other abnormalities of gait and mobility  Muscle weakness (generalized)  RATIONALE FOR EVALUATION AND TREATMENT: Rehabilitation  ONSET DATE: Progressive decline over past few years  NEXT MD VISIT: 02/20/23   SUBJECTIVE:  SUBJECTIVE STATEMENT: Pt reports his walking is still difficult - limited stamina/weakness where he needs to stop and rest.  Pt accompanied by: significant other - wife - Cory Gonzalez (in waiting room)  PAIN: Are you having pain? No   PERTINENT HISTORY:  Paroxysmal A-fib, chronic DHF, heart murmur, generalized OA, RA, COPD/emphysema, HTN, HLD, CKD, R elbow/olecranon fracture 2019, RLS  PRECAUTIONS: Fall  WEIGHT BEARING RESTRICTIONS: No  FALLS:  Has patient fallen in last 6 months? Yes. Number of falls >6  LIVING ENVIRONMENT: Lives with: lives with their spouse Lives in: House/apartment Stairs: Yes: Internal: 1 steps; sunken room and External: 1 steps; ramp Has following equipment at home: Single point cane, Walker - 2 wheeled, Environmental consultant - 4 wheeled, Wheelchair (manual), Ramped entry, and transport chair  OCCUPATION: Retired  PLOF: Independent and Leisure: most sedentary  PATIENT GOALS: "To do better with my balance, my walking and getting up out of a  chair."   OBJECTIVE: (objective measures completed at initial evaluation unless otherwise dated)  DIAGNOSTIC FINDINGS:  03/13/22 - CT cervical spine s/p fall: IMPRESSION: 1. No evidence of acute fracture to the cervical spine. 2. Nonspecific straightening of the expected cervical lordosis. 3. Grade 1 anterolisthesis at C2-C3, C3-C4, C6-C7 and T1-T2. 4. Cervical spondylosis, as described.  03/13/22 Head CT s/p fall with trauma to the head and face: IMPRESSION:  No acute or traumatic finding. Age related volume loss and chronic small-vessel ischemic change of the white matter.  COGNITION: Overall cognitive status: Within functional limits for tasks assessed   SENSATION: B LE RLS  COORDINATION: Slowed reciprocal movements  MUSCLE LENGTH: (TBA as indicated) Hamstrings:  ITB:  Piriformis:  Hip flexors:  Quads:  Heelcord:   POSTURE:  rounded shoulders, forward head, decreased lumbar lordosis, increased thoracic kyphosis, flexed trunk , and significant kyphoscoliosis  LOWER EXTREMITY MMT:  (tested in sitting on eval, 09/04/22 and 10/02/22)  MMT Right Eval Left Eval R 09/04/22 L 09/04/22 R 10/02/22 L 10/02/22  Hip flexion 4- 4- 4- 4- 4 4-  Hip extension 4 4- 4 4- 4 4-  Hip abduction 4+ 4 4+ 4+ 4+ 4+  Hip adduction 5 4+ 5 5 5 5   Hip internal rotation 4+ 4- 4+ 4+ 4+ 4+  Hip external rotation 4- 4- 4 4 4+ 4+  Knee flexion 4 4- 4+ 4+ 5 5  Knee extension 4+ 4 4+ 4+ 5 5  Ankle dorsiflexion 3+ 3+ 4- 4 4 4   Ankle plantarflexion        Ankle inversion        Ankle eversion        (Blank rows = not tested)  BED MOBILITY:  NT  TRANSFERS: Assistive device utilized: Environmental consultant - 4 wheeled  Sit to stand: SBA and CGA - CGA when performing without UE assist Stand to sit: SBA Chair to chair: SBA Floor:  NT  GAIT: Distance walked: 80 ft Assistive device utilized: Environmental consultant - 4 wheeled Level of assistance: Modified independence and SBA Gait pattern: step through pattern, decreased stride length,  decreased hip/knee flexion- Right, decreased hip/knee flexion- Left, and trunk flexed Comments:  RAMP: Level of Assistance:  NT Assistive device utilized: NT Ramp Comments:   CURB:  Level of Assistance:  NT Assistive device utilized:  NT Curb Comments:   STAIRS:  Level of Assistance: NT  Stair Negotiation Technique:   Number of Stairs:    Height of Stairs:   Comments:   FUNCTIONAL TESTS:  5 times sit to stand: 13.72  sec with B UE assist; able to complete single STS w/o UE assist but unable to maintain balance Timed up and go (TUG): 25.22 sec with rollator - 08/14/22 10 meter walk test: 17.44 sec with rollator; Gait speed = 1.88 ft sec with rollator - 08/14/22 Sharlene Motts Balance Scale: 33/56; < 36 high risk for falls (close to 100%) Dynamic Gait Index: 13/24, Scores of 19 or less are predictive of falls in older community living adults - 08/14/22  09/04/22 Berg: 40/56; 37-45 significant risk for fall (>80%)   09/07/22 TUG: 17 seconds with rollator  09/24/22: : 18.75 sec with rollator Gait speed: 1.75 ft /sec with rollator DGI: 14/24: Scores of 19 or less are predictive of falls in older community living adults  5xSTS: 19.25 sec w/o need for UE assist; 14.19 sec w/ B UE assist  PATIENT SURVEYS:  ABC scale 520 / 1600 = 32.5 %   TODAY'S TREATMENT:    10/10/22 THERAPEUTIC EXERCISE: to improve flexibility, strength and mobility.  Demonstration, verbal and tactile cues throughout for technique.  UBE - L1.0 x 6 minutes backward Seated RTB scap retraction into pool noodle + shoulder extension to neutral x10 Seated RTB scap retraction into pool noodle + low angel shoulder horiz ABD x 10  Seated thoracolumbar extension over back of chair with hands on back legs of chair x 10  NEUROMUSCULAR RE-EDUCATION: To improve posture, balance, coordination, reduce fall risk, and reduce rigidity. Alt fwd step with hands on back of 2 chairs focusing on trunk extension and promoting upright posture  x 20 Alt fwd step with hands on back of 2 chairs focusing on trunk extension and promoting upright posture x 20, adding step-over 1/2 FR to increase hip and knee flexion and foot clearance during steps, 2nd set at end of session Standing PWR! Moves: Up x 10 - holding on to back of chair for squatting component & releasing chair to achieve full extension Rock x 10 - one hand on back of chair for balance Twist x 10 - one hand on back of chair for balance Step x 10 - one hand on back of chair for balance Alt backward step with hands on back of 2 chairs x 20   10/02/22 THERAPEUTIC EXERCISE: to improve flexibility, strength and mobility.  Demonstration, verbal and tactile cues throughout for technique.  NuStep - L5 x 6 min Standing R/L hip extension with looped RTB at ankles x 10 bil, with looped GTB at ankles x 10 bil; UE support on locked rollator for balance - pt substituting with knee flexion when attempted with GTB Trunk extension roll-up + scap retraction & long-arm ER into pool noodle on wall 2 x 10 Lumbar extension with forearms/hands on wall x 10 Standing YTB scap retraction + rows x 10, SBA of PT for balance Standing YTB scap retraction + shoulder extension x 10, SBA of PT for balance Standing bird dog x 10 bil with UE support on back of chair - pt having difficulty coordinating reciprocal arm and leg motions Mini-squat with chair behind for safety 2 x 10 - focusing on full trunk and hip extension on return to stand  THERAPEUTIC ACTIVITIES: LE MMT   09/26/22 THERAPEUTIC EXERCISE: to improve flexibility, strength and mobility.  Demonstration, verbal and tactile cues throughout for technique.  NuStep - L5 x 6 min Standing R/L hip ABD with looped RTB at ankles x 10 bil; UE support on back of chair Standing R/L hip extension with looped RTB at ankles x  10 bil; UE support on back of chair Standing R/L hip flexion march with looped RTB at midfeet x 10 bil; UE support on back of  chair Standing heel raises 2x10 UE support on chair Standing bird dog x 10 bil Knee extension 10# x15 BLE Knee flexion 15# BLE Rows 15# 2x10   PATIENT EDUCATION:  Education details: progress with PT, ongoing PT POC, and HEP resistance clarification (RTB for standing hip exercises)   Person educated: Patient Education method: Explanation, Demonstration, and Verbal cues Education comprehension: verbalized understanding, returned demonstration, verbal cues required, and needs further education  HOME EXERCISE PROGRAM: Access Code: [email or web address] URL: [email or web address] Date: 09/24/2022 Prepared by: Glenetta Hew  Exercises - Seated Thoracic Lumbar Extension  - 1 x daily - 7 x weekly - 2 sets - 10 reps - 3 sec hold - Seated Scapular Retraction with External Rotation  - 1 x daily - 7 x weekly - 2 sets - 10 reps - 3 sec hold - Seated Hip Abduction with Resistance  - 1 x daily - 7 x weekly - 2 sets - 10 reps - 3-5 sec hold - Seated March with Resistance  - 1 x daily - 3 x weekly - 2 sets - 10 reps - 3 sec hold - Seated Ankle Dorsiflexion with Resistance  - 1 x daily - 7 x weekly - 2 sets - 10 reps - 3 sec hold - Long Sitting Upper Back Extension Press  - 1 x daily - 7 x weekly - 2 sets - 10 reps - 3 sec hold - Seated Shoulder Row with Anchored Resistance  - 1 x daily - 7 x weekly - 2 sets - 10 reps - 3-5 hold hold - Standing Hip Extension with Resistance at Ankles and Counter Support  - 1 x daily - 3 x weekly - 2 sets - 10 reps - 3 sec hold - Marching with Resistance  - 1 x daily - 3 x weekly - 2 sets - 10 reps - 3 sec hold   ASSESSMENT:  CLINICAL IMPRESSION: Cory "Ray" continues to report limited stamina with walking due to fatigue/weakness, requiring frequent seated rest breaks, but then able to resume. Continued focus on standing activities incorporating standing and stepping PWR! Moves to facilitate improved posture, weight shift, trunk mobility/rotation and transitional  stability, with intermittent seated rest breaks also continuing emphasis on postural stretching, control and strengthening. He requires cues for carryover of posture and larger movement patterns into walking and mobility.  Ray will benefit from continued skilled PT to address ongoing deficits to improve mobility and activity tolerance with decreased pain interference.   OBJECTIVE IMPAIRMENTS: Abnormal gait, decreased activity tolerance, decreased balance, decreased coordination, decreased knowledge of condition, decreased knowledge of use of DME, decreased mobility, difficulty walking, decreased ROM, decreased strength, decreased safety awareness, impaired perceived functional ability, impaired flexibility, impaired sensation, improper body mechanics, postural dysfunction, and pain.   ACTIVITY LIMITATIONS: carrying, lifting, bending, standing, squatting, stairs, transfers, bed mobility, locomotion level, and caring for others  PARTICIPATION LIMITATIONS: meal prep, cleaning, laundry, driving, community activity, and yard work  PERSONAL FACTORS: Age, Fitness, Past/current experiences, Time since onset of injury/illness/exacerbation, and 3+ comorbidities: Paroxysmal A-fib, chronic DHF, heart murmur, generalized OA, RA, COPD/emphysema, HTN, HLD, CKD, R elbow/olecranon fracture 2019, RLS  are also affecting patient's functional outcome.   REHAB POTENTIAL: Good  CLINICAL DECISION MAKING: Evolving/moderate complexity  EVALUATION COMPLEXITY: Moderate   GOALS: Goals reviewed with patient? Yes  SHORT TERM GOALS: Target date: 09/05/2022  Patient will be independent with initial HEP to improve outcomes and carryover.  Baseline: TBD Goal status: MET  08/22/22    2.  Patient will be educated on strategies to decrease risk of falls.  Baseline:  Goal status: MET  09/07/22  3.  Patient will demonstrate decreased TUG time to </= 20 sec to decrease risk for falls with transitional mobility. Baseline: 25.22  sec with rollator  Goal status: MET  09/07/22 - 17 sec with rollator  LONG TERM GOALS: Target date: 10/03/2022, extended to 11/05/2022  Patient will be independent with advanced/ongoing HEP to facilitate ability to maintain/progress functional gains from skilled physical therapy services. Baseline: Goal status: IN PROGRESS  09/24/22 - HEP updated today  2.  Patient will be able to step up/down curb safely with LRAD for safety with community ambulation and increased ease of access to sunken room in home.  Baseline:  Goal status: IN PROGRESS   3.  Patient will demonstrate improved functional LE strength as demonstrated by ability to independently achieve sit to stand without UE assist or loss of balance. Baseline: Requires B UE assist for majority of sit to stand; w/o UE assist, he requires increased momentum with LOB observed upon reaching standing Goal status: MET  09/24/22  4.  Patient will improve 5x STS time to </= 14.8 seconds w/o UE assist for improved efficiency and safety with transfers per age-matched norms. Baseline: 13.72 sec with B UE assist; able to complete single STS w/o UE assist but unable to maintain balance Goal status: IN PROGRESS  09/24/22 - 19.25 sec w/o need for UE assist (no LOB); 14.19 sec w/ B UE assist  5.  Patient will demonstrate gait speed of >/= 2.62 ft/sec (0.8 m/s) to be a safe community ambulator with decreased risk for recurrent falls.  Baseline: 1.88 ft/sec with rollator Goal status: IN PROGRESS  09/24/22 - 1.75 ft/sec with rollator  6.  Patient will improve Berg score to >/= 41/56 to improve safety and stability with ADLs in standing and reduce risk for falls. (MCID= 8 points)  Baseline: 33/56  (43/56 as of DC from PT in 2019) Goal status: IN PROGRESS  09/04/22 - Berg: 40/56  7.  Patient will demonstrate at least 19/24 on DGI to improve gait stability and reduce risk for falls. Baseline: 13/24 on 08/14/22  (20/24 as of DC from PT in 2019) Goal status: IN  PROGRESS  09/24/22 - DGI: 14/24  8  Patient will report >/= 50% on ABC scale to demonstrate improved functional ability. Baseline: 520 / 1600 = 32.5 % Goal status: IN PROGRESS   PLAN:  PT FREQUENCY: 1-2x/week, starting at 2x/wk, but reducing frequency to 1x/wk if too overwhelmed  PT DURATION: 6 weeks  PLANNED INTERVENTIONS: Therapeutic exercises, Therapeutic activity, Neuromuscular re-education, Balance training, Gait training, Patient/Family education, Self Care, Joint mobilization, Stair training, DME instructions, Dry Needling, Electrical stimulation, Cryotherapy, Moist heat, Taping, Ultrasound, Manual therapy, and Re-evaluation  PLAN FOR NEXT SESSION:  curb negotiation (weather permitting); reassess Berg; postural and core/LE strengthening working towards more standing exercises/activities; balance training, possibly integrating components of [place] fall prevention program and PWR! Moves  [place], [place] 10/10/22 12:04 PM

## 2022-10-12 ENCOUNTER — Encounter: Payer: Self-pay | Admitting: Physical Therapy

## 2022-10-12 ENCOUNTER — Ambulatory Visit: Payer: Medicare Other | Admitting: Physical Therapy

## 2022-10-12 DIAGNOSIS — R2681 Unsteadiness on feet: Secondary | ICD-10-CM

## 2022-10-12 DIAGNOSIS — M6281 Muscle weakness (generalized): Secondary | ICD-10-CM

## 2022-10-12 DIAGNOSIS — R296 Repeated falls: Secondary | ICD-10-CM | POA: Diagnosis not present

## 2022-10-12 DIAGNOSIS — R2689 Other abnormalities of gait and mobility: Secondary | ICD-10-CM | POA: Diagnosis not present

## 2022-10-12 NOTE — Therapy (Signed)
OUTPATIENT PHYSICAL THERAPY TREATMENT     Patient Name: Cory Gonzalez MRN: [id number] DOB:1929-02-05, 89 y.o., male Today's Date: 10/12/2022   END OF SESSION:  PT End of Session - 10/12/22 1104     Visit Number 14    Date for PT Re-Evaluation 11/05/22    Authorization Type Medicare & BCBS    Progress Note Due on Visit 20    PT Start Time 1104    PT Stop Time 1146    PT Time Calculation (min) 42 min    Activity Tolerance Patient tolerated treatment well    Behavior During Therapy WFL for tasks assessed/performed                    Past Medical History:  Diagnosis Date   Chronic diastolic heart failure (HCC)    Dysrhythmia    A-fib   FUO (fever of unknown origin) 07/27/2019   Heart murmur    since childhood, never has had any problems   Hypertension    Paroxysmal atrial fibrillation (HCC)    Pneumonia    PONV (postoperative nausea and vomiting)    a little nausea   Rheumatoid arthritis (HCC)    Tick bite 07/27/2019   Past Surgical History:  Procedure Laterality Date   CATARACT EXTRACTION Bilateral    COLONOSCOPY     EXCISION MASS HEAD Left 03/29/2022   Procedure: EXCISION OF EAR MOH'S DEFECT;  Surgeon: Scarlette Ar, MD;  Location: [facility] OR;  Service: ENT;  Laterality: Left;   NASAL FLAP ROTATION Left 03/29/2022   Procedure: POSSIBLE FLAP ROTATION; POSSIBLE EAR CARTILAGE GRAFT;  Surgeon: Scarlette Ar, MD;  Location: [facility] OR;  Service: ENT;  Laterality: Left;   ORIF ELBOW FRACTURE Right 09/18/2017   Procedure: OPEN REDUCTION INTERNAL FIXATION (ORIF) RIGHT ELBOW/OLECRANON FRACTURE;  Surgeon: Eldred Manges, MD;  Location: [facility] OR;  Service: Orthopedics;  Laterality: Right;   PROSTATE SURGERY     SKIN FULL THICKNESS GRAFT Left 03/29/2022   Procedure: ADJACENT TISSUE TRANSFER;  SKIN GRAFT;  Surgeon: Scarlette Ar, MD;  Location: [facility] OR;  Service: ENT;  Laterality: Left;   Patient Active Problem List   Diagnosis Date Noted   FUO (fever of unknown origin) 07/27/2019    Tick bite 07/27/2019   Pneumothorax on right 09/19/2017   Olecranon fracture 09/18/2017   Blepharitis of upper and lower eyelids of both eyes 04/19/2016   Pseudophakia of both eyes 04/19/2016   TB lung, latent 06/05/2011   Ocular hypertension, 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   Rheumatoid arthritis (HCC) 02/16/2004    PCP: [facility]   REFERRING PROVIDER:  Georgianne Fick, MD   REFERRING DIAG: R26.9 (ICD-10-CM) - Gait abnormality    THERAPY DIAG:  Repeated falls  Unsteadiness on feet  Other abnormalities of gait and mobility  Muscle weakness (generalized)  RATIONALE FOR EVALUATION AND TREATMENT: Rehabilitation  ONSET DATE: Progressive decline over past few years  NEXT MD VISIT: 02/20/23   SUBJECTIVE:  SUBJECTIVE STATEMENT: Pt reports everything is the same. He does feel like he has the stamina to do the things he wants to do around the house. No recent falls.  Pt accompanied by: significant other - wife - [name] (in waiting room)  PAIN: Are you having pain? No   PERTINENT HISTORY:  Paroxysmal A-fib, chronic DHF, heart murmur, generalized OA, RA, COPD/emphysema, HTN, HLD, CKD, R elbow/olecranon fracture 2019, RLS  PRECAUTIONS: Fall  WEIGHT BEARING RESTRICTIONS: No  FALLS:  Has patient fallen in last 6 months? Yes. Number of falls >6  LIVING ENVIRONMENT: Lives with: lives with their spouse Lives in: House/apartment Stairs: Yes: Internal: 1 steps; sunken room and External: 1 steps; ramp Has following equipment at home: Single point cane, Walker - 2 wheeled, Environmental consultant - 4 wheeled, Wheelchair (manual), Ramped entry, and transport chair  OCCUPATION: Retired  PLOF: Independent and Leisure: most sedentary  PATIENT GOALS: "To do better with my balance, my  walking and getting up out of a chair."   OBJECTIVE: (objective measures completed at initial evaluation unless otherwise dated)  DIAGNOSTIC FINDINGS:  03/13/22 - CT cervical spine s/p fall: IMPRESSION: 1. No evidence of acute fracture to the cervical spine. 2. Nonspecific straightening of the expected cervical lordosis. 3. Grade 1 anterolisthesis at C2-C3, C3-C4, C6-C7 and T1-T2. 4. Cervical spondylosis, as described.  03/13/22 Head CT s/p fall with trauma to the head and face: IMPRESSION:  No acute or traumatic finding. Age related volume loss and chronic small-vessel ischemic change of the white matter.  COGNITION: Overall cognitive status: Within functional limits for tasks assessed   SENSATION: B LE RLS  COORDINATION: Slowed reciprocal movements  POSTURE:  rounded shoulders, forward head, decreased lumbar lordosis, increased thoracic kyphosis, flexed trunk , and significant kyphoscoliosis  LOWER EXTREMITY MMT:  (tested in sitting on eval, 09/04/22 and 10/02/22)  MMT Right Eval Left Eval R 09/04/22 L 09/04/22 R 10/02/22 L 10/02/22  Hip flexion 4- 4- 4- 4- 4 4-  Hip extension 4 4- 4 4- 4 4-  Hip abduction 4+ 4 4+ 4+ 4+ 4+  Hip adduction 5 4+ 5 5 5 5   Hip internal rotation 4+ 4- 4+ 4+ 4+ 4+  Hip external rotation 4- 4- 4 4 4+ 4+  Knee flexion 4 4- 4+ 4+ 5 5  Knee extension 4+ 4 4+ 4+ 5 5  Ankle dorsiflexion 3+ 3+ 4- 4 4 4   Ankle plantarflexion        Ankle inversion        Ankle eversion        (Blank rows = not tested)  BED MOBILITY:  NT  TRANSFERS: Assistive device utilized: Environmental consultant - 4 wheeled  Sit to stand: SBA and CGA - CGA when performing without UE assist Stand to sit: SBA Chair to chair: SBA Floor:  NT  GAIT: Distance walked: 80 ft Assistive device utilized: Environmental consultant - 4 wheeled Level of assistance: Modified independence and SBA Gait pattern: step through pattern, decreased stride length, decreased hip/knee flexion- Right, decreased hip/knee flexion- Left, and  trunk flexed Comments:  RAMP: Level of Assistance:  NT Assistive device utilized: NT Ramp Comments:   CURB:  Level of Assistance:  NT Assistive device utilized:  NT Curb Comments:   STAIRS:  Level of Assistance: NT  Stair Negotiation Technique:   Number of Stairs:    Height of Stairs:   Comments:   FUNCTIONAL TESTS:  5 times sit to stand: 13.72 sec with B UE assist; able to complete  single STS w/o UE assist but unable to maintain balance Timed up and go (TUG): 25.22 sec with rollator - 08/14/22 10 meter walk test: 17.44 sec with rollator; Gait speed = 1.88 ft sec with rollator - 08/14/22 Sharlene Motts Balance Scale: 33/56; < 36 high risk for falls (close to 100%) Dynamic Gait Index: 13/24, Scores of 19 or less are predictive of falls in older community living adults - 08/14/22  09/04/22 Berg: 40/56; 37-45 significant risk for fall (>80%)   09/07/22 TUG: 17 seconds with rollator  09/24/22: : 18.75 sec with rollator Gait speed: 1.75 ft /sec with rollator DGI: 14/24: Scores of 19 or less are predictive of falls in older community living adults  5xSTS: 19.25 sec w/o need for UE assist; 14.19 sec w/ B UE assist  PATIENT SURVEYS:  ABC scale 520 / 1600 = 32.5 %   TODAY'S TREATMENT:    10/12/22 THERAPEUTIC EXERCISE: to improve flexibility, strength and mobility.  Demonstration, verbal and tactile cues throughout for technique.  NuStep - L5 x 6 min Standing R/L hip extension with looped GTB at ankles x 10 bil; UE support on locked rollator for balance  Standing R/L hip abduction with looped GTB at ankles x 10 bil; UE support on locked rollator for balance   NEUROMUSCULAR RE-EDUCATION: To improve posture, balance, coordination, reduce fall risk, and reduce rigidity. Alt fwd step with hands on back of 2 chairs focusing on trunk extension and promoting upright posture x 20 Alt backward step with hands on back of 2 chairs x 20 Alt fwd step over 1/2 FR to increase hip and knee flexion and  foot clearance during steps with hands on back of 2 chairs focusing on trunk extension and promoting upright posture x 20 PWR! Sit to Stand x 10 Standing PWR! Moves: Up x 10 - focusing on trunk extension and shoulder extension/scap retraction with YTB resistance Rock x 10 - one hand on back of chair for balance Twist x 10 - one hand on back of chair for balance, pulling back with YTB on side of rotation Step x 10 - one hand on back of chair for balance Alt toe clears to 9" stool x 20, initially with B UE support on back of 2 chairs, weaning to single UE support, then no UE support for last 10 reps   10/10/22 THERAPEUTIC EXERCISE: to improve flexibility, strength and mobility.  Demonstration, verbal and tactile cues throughout for technique.  UBE - L1.0 x 6 minutes backward Seated RTB scap retraction into pool noodle + shoulder extension to neutral x10 Seated RTB scap retraction into pool noodle + low angel shoulder horiz ABD x 10  Seated thoracolumbar extension over back of chair with hands on back legs of chair x 10  NEUROMUSCULAR RE-EDUCATION: To improve posture, balance, coordination, reduce fall risk, and reduce rigidity. Alt fwd step with hands on back of 2 chairs focusing on trunk extension and promoting upright posture x 20 Alt fwd step with hands on back of 2 chairs focusing on trunk extension and promoting upright posture x 20, adding step-over 1/2 FR to increase hip and knee flexion and foot clearance during steps, 2nd set at end of session Standing PWR! Moves: Up x 10 - holding on to back of chair for squatting component & releasing chair to achieve full extension Rock x 10 - one hand on back of chair for balance Twist x 10 - one hand on back of chair for balance Step x 10 - one hand  on back of chair for balance Alt backward step with hands on back of 2 chairs x 20   10/02/22 THERAPEUTIC EXERCISE: to improve flexibility, strength and mobility.  Demonstration, verbal and tactile  cues throughout for technique.  NuStep - L5 x 6 min Standing R/L hip extension with looped RTB at ankles x 10 bil, with looped GTB at ankles x 10 bil; UE support on locked rollator for balance - pt substituting with knee flexion when attempted with GTB Trunk extension roll-up + scap retraction & long-arm ER into pool noodle on wall 2 x 10 Lumbar extension with forearms/hands on wall x 10 Standing YTB scap retraction + rows x 10, SBA of PT for balance Standing YTB scap retraction + shoulder extension x 10, SBA of PT for balance Standing bird dog x 10 bil with UE support on back of chair - pt having difficulty coordinating reciprocal arm and leg motions Mini-squat with chair behind for safety 2 x 10 - focusing on full trunk and hip extension on return to stand  THERAPEUTIC ACTIVITIES: LE MMT   PATIENT EDUCATION:  Education details: progress with PT and ongoing PT POC  Person educated: Patient Education method: Explanation, Demonstration, and Verbal cues Education comprehension: verbalized understanding, returned demonstration, verbal cues required, and needs further education  HOME EXERCISE PROGRAM: Access Code: [email or web address] URL: [email or web address] Date: 09/24/2022 Prepared by: Glenetta Hew  Exercises - Seated Thoracic Lumbar Extension  - 1 x daily - 7 x weekly - 2 sets - 10 reps - 3 sec hold - Seated Scapular Retraction with External Rotation  - 1 x daily - 7 x weekly - 2 sets - 10 reps - 3 sec hold - Seated Hip Abduction with Resistance  - 1 x daily - 7 x weekly - 2 sets - 10 reps - 3-5 sec hold - Seated March with Resistance  - 1 x daily - 3 x weekly - 2 sets - 10 reps - 3 sec hold - Seated Ankle Dorsiflexion with Resistance  - 1 x daily - 7 x weekly - 2 sets - 10 reps - 3 sec hold - Long Sitting Upper Back Extension Press  - 1 x daily - 7 x weekly - 2 sets - 10 reps - 3 sec hold - Seated Shoulder Row with Anchored Resistance  - 1 x daily - 7 x weekly - 2 sets - 10 reps  - 3-5 hold hold - Standing Hip Extension with Resistance at Ankles and Counter Support  - 1 x daily - 3 x weekly - 2 sets - 10 reps - 3 sec hold - Marching with Resistance  - 1 x daily - 3 x weekly - 2 sets - 10 reps - 3 sec hold   ASSESSMENT:  CLINICAL IMPRESSION: Cory "Ray" denies any recent falls but continues to c/o limited stamina with activities around the house. Therapeutic exercises and activities continuing to target proximal LE and postural strength in standing incorporating PWR! Moves to facilitate improved posture, weight shift, trunk mobility/rotation and transitional stability for improved standing/walking tolerance and balance as well as reduced fall risk. Intermittent seated rest breaks still necessary but pt typically able to complete more than one activity before rest break needed. Ray will benefit from continued skilled PT to address ongoing deficits to improve mobility and activity tolerance with decreased pain interference.   OBJECTIVE IMPAIRMENTS: Abnormal gait, decreased activity tolerance, decreased balance, decreased coordination, decreased knowledge of condition, decreased knowledge of use of DME, decreased mobility, difficulty walking,  decreased ROM, decreased strength, decreased safety awareness, impaired perceived functional ability, impaired flexibility, impaired sensation, improper body mechanics, postural dysfunction, and pain.   ACTIVITY LIMITATIONS: carrying, lifting, bending, standing, squatting, stairs, transfers, bed mobility, locomotion level, and caring for others  PARTICIPATION LIMITATIONS: meal prep, cleaning, laundry, driving, community activity, and yard work  PERSONAL FACTORS: Age, Fitness, Past/current experiences, Time since onset of injury/illness/exacerbation, and 3+ comorbidities: Paroxysmal A-fib, chronic DHF, heart murmur, generalized OA, RA, COPD/emphysema, HTN, HLD, CKD, R elbow/olecranon fracture 2019, RLS  are also affecting patient's functional  outcome.   REHAB POTENTIAL: Good  CLINICAL DECISION MAKING: Evolving/moderate complexity  EVALUATION COMPLEXITY: Moderate   GOALS: Goals reviewed with patient? Yes  SHORT TERM GOALS: Target date: 09/05/2022  Patient will be independent with initial HEP to improve outcomes and carryover.  Baseline: TBD Goal status: MET  08/22/22    2.  Patient will be educated on strategies to decrease risk of falls.  Baseline:  Goal status: MET  09/07/22  3.  Patient will demonstrate decreased TUG time to </= 20 sec to decrease risk for falls with transitional mobility. Baseline: 25.22 sec with rollator  Goal status: MET  09/07/22 - 17 sec with rollator  LONG TERM GOALS: Target date: 10/03/2022, extended to 11/05/2022  Patient will be independent with advanced/ongoing HEP to facilitate ability to maintain/progress functional gains from skilled physical therapy services. Baseline: Goal status: IN PROGRESS  09/24/22 - HEP updated today  2.  Patient will be able to step up/down curb safely with LRAD for safety with community ambulation and increased ease of access to sunken room in home.  Baseline:  Goal status: IN PROGRESS   3.  Patient will demonstrate improved functional LE strength as demonstrated by ability to independently achieve sit to stand without UE assist or loss of balance. Baseline: Requires B UE assist for majority of sit to stand; w/o UE assist, he requires increased momentum with LOB observed upon reaching standing Goal status: MET  09/24/22  4.  Patient will improve 5x STS time to </= 14.8 seconds w/o UE assist for improved efficiency and safety with transfers per age-matched norms. Baseline: 13.72 sec with B UE assist; able to complete single STS w/o UE assist but unable to maintain balance Goal status: IN PROGRESS  09/24/22 - 19.25 sec w/o need for UE assist (no LOB); 14.19 sec w/ B UE assist  5.  Patient will demonstrate gait speed of >/= 2.62 ft/sec (0.8 m/s) to be a safe community  ambulator with decreased risk for recurrent falls.  Baseline: 1.88 ft/sec with rollator Goal status: IN PROGRESS  09/24/22 - 1.75 ft/sec with rollator  6.  Patient will improve Berg score to >/= 41/56 to improve safety and stability with ADLs in standing and reduce risk for falls. (MCID= 8 points)  Baseline: 33/56  (43/56 as of DC from PT in 2019) Goal status: IN PROGRESS  09/04/22 - Berg: 40/56  7.  Patient will demonstrate at least 19/24 on DGI to improve gait stability and reduce risk for falls. Baseline: 13/24 on 08/14/22  (20/24 as of DC from PT in 2019) Goal status: IN PROGRESS  09/24/22 - DGI: 14/24  8  Patient will report >/= 50% on ABC scale to demonstrate improved functional ability. Baseline: 520 / 1600 = 32.5 % Goal status: IN PROGRESS   PLAN:  PT FREQUENCY: 1-2x/week, starting at 2x/wk, but reducing frequency to 1x/wk if too overwhelmed  PT DURATION: 6 weeks  PLANNED INTERVENTIONS: Therapeutic exercises, Therapeutic  activity, Neuromuscular re-education, Balance training, Gait training, Patient/Family education, Self Care, Joint mobilization, Stair training, DME instructions, Dry Needling, Electrical stimulation, Cryotherapy, Moist heat, Taping, Ultrasound, Manual therapy, and Re-evaluation  PLAN FOR NEXT SESSION:  curb negotiation (weather permitting); postural and core/LE strengthening working towards more standing exercises/activities; balance training, integrating components of [place] fall prevention program and PWR! Moves  [place], [place] 10/12/22 12:11 PM

## 2022-10-15 DIAGNOSIS — L570 Actinic keratosis: Secondary | ICD-10-CM | POA: Diagnosis not present

## 2022-10-15 DIAGNOSIS — X32XXXD Exposure to sunlight, subsequent encounter: Secondary | ICD-10-CM | POA: Diagnosis not present

## 2022-10-16 ENCOUNTER — Ambulatory Visit: Payer: Medicare Other

## 2022-10-16 DIAGNOSIS — R2681 Unsteadiness on feet: Secondary | ICD-10-CM

## 2022-10-16 DIAGNOSIS — R296 Repeated falls: Secondary | ICD-10-CM | POA: Diagnosis not present

## 2022-10-16 DIAGNOSIS — R2689 Other abnormalities of gait and mobility: Secondary | ICD-10-CM | POA: Diagnosis not present

## 2022-10-16 DIAGNOSIS — M6281 Muscle weakness (generalized): Secondary | ICD-10-CM | POA: Diagnosis not present

## 2022-10-16 NOTE — Therapy (Signed)
OUTPATIENT PHYSICAL THERAPY TREATMENT     Patient Name: Cory Gonzalez MRN: [id number] DOB:01/25/1930, 89 y.o., male Today's Date: 10/16/2022   END OF SESSION:  PT End of Session - 10/16/22 1321     Visit Number 15    Date for PT Re-Evaluation 11/05/22    Authorization Type Medicare & BCBS    Progress Note Due on Visit 20    PT Start Time 1315    PT Stop Time 1358    PT Time Calculation (min) 43 min    Activity Tolerance Patient tolerated treatment well    Behavior During Therapy WFL for tasks assessed/performed                     Past Medical History:  Diagnosis Date   Chronic diastolic heart failure (HCC)    Dysrhythmia    A-fib   FUO (fever of unknown origin) 07/27/2019   Heart murmur    since childhood, never has had any problems   Hypertension    Paroxysmal atrial fibrillation (HCC)    Pneumonia    PONV (postoperative nausea and vomiting)    a little nausea   Rheumatoid arthritis (HCC)    Tick bite 07/27/2019   Past Surgical History:  Procedure Laterality Date   CATARACT EXTRACTION Bilateral    COLONOSCOPY     EXCISION MASS HEAD Left 03/29/2022   Procedure: EXCISION OF EAR MOH'S DEFECT;  Surgeon: Scarlette Ar, MD;  Location: [facility] OR;  Service: ENT;  Laterality: Left;   NASAL FLAP ROTATION Left 03/29/2022   Procedure: POSSIBLE FLAP ROTATION; POSSIBLE EAR CARTILAGE GRAFT;  Surgeon: Scarlette Ar, MD;  Location: [facility] OR;  Service: ENT;  Laterality: Left;   ORIF ELBOW FRACTURE Right 09/18/2017   Procedure: OPEN REDUCTION INTERNAL FIXATION (ORIF) RIGHT ELBOW/OLECRANON FRACTURE;  Surgeon: Eldred Manges, MD;  Location: [facility] OR;  Service: Orthopedics;  Laterality: Right;   PROSTATE SURGERY     SKIN FULL THICKNESS GRAFT Left 03/29/2022   Procedure: ADJACENT TISSUE TRANSFER;  SKIN GRAFT;  Surgeon: Scarlette Ar, MD;  Location: [facility] OR;  Service: ENT;  Laterality: Left;   Patient Active Problem List   Diagnosis Date Noted   FUO (fever of unknown origin) 07/27/2019    Tick bite 07/27/2019   Pneumothorax on right 09/19/2017   Olecranon fracture 09/18/2017   Blepharitis of upper and lower eyelids of both eyes 04/19/2016   Pseudophakia of both eyes 04/19/2016   TB lung, latent 06/05/2011   Ocular hypertension, 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   Rheumatoid arthritis (HCC) 02/16/2004    PCP: [facility]   REFERRING PROVIDER:  Georgianne Fick, MD   REFERRING DIAG: R26.9 (ICD-10-CM) - Gait abnormality    THERAPY DIAG:  Repeated falls  Unsteadiness on feet  Other abnormalities of gait and mobility  Muscle weakness (generalized)  RATIONALE FOR EVALUATION AND TREATMENT: Rehabilitation  ONSET DATE: Progressive decline over past few years  NEXT MD VISIT: 02/20/23   SUBJECTIVE:  SUBJECTIVE STATEMENT: Pt reports he is wiped out coming in today, just doesn't seem to have that much energy.   Pt accompanied by: significant other - wife - [name] (in waiting room)  PAIN: Are you having pain? No   PERTINENT HISTORY:  Paroxysmal A-fib, chronic DHF, heart murmur, generalized OA, RA, COPD/emphysema, HTN, HLD, CKD, R elbow/olecranon fracture 2019, RLS  PRECAUTIONS: Fall  WEIGHT BEARING RESTRICTIONS: No  FALLS:  Has patient fallen in last 6 months? Yes. Number of falls >6  LIVING ENVIRONMENT: Lives with: lives with their spouse Lives in: House/apartment Stairs: Yes: Internal: 1 steps; sunken room and External: 1 steps; ramp Has following equipment at home: Single point cane, Walker - 2 wheeled, Environmental consultant - 4 wheeled, Wheelchair (manual), Ramped entry, and transport chair  OCCUPATION: Retired  PLOF: Independent and Leisure: most sedentary  PATIENT GOALS: "To do better with my balance, my walking and getting up out of a  chair."   OBJECTIVE: (objective measures completed at initial evaluation unless otherwise dated)  DIAGNOSTIC FINDINGS:  03/13/22 - CT cervical spine s/p fall: IMPRESSION: 1. No evidence of acute fracture to the cervical spine. 2. Nonspecific straightening of the expected cervical lordosis. 3. Grade 1 anterolisthesis at C2-C3, C3-C4, C6-C7 and T1-T2. 4. Cervical spondylosis, as described.  03/13/22 Head CT s/p fall with trauma to the head and face: IMPRESSION:  No acute or traumatic finding. Age related volume loss and chronic small-vessel ischemic change of the white matter.  COGNITION: Overall cognitive status: Within functional limits for tasks assessed   SENSATION: B LE RLS  COORDINATION: Slowed reciprocal movements  POSTURE:  rounded shoulders, forward head, decreased lumbar lordosis, increased thoracic kyphosis, flexed trunk , and significant kyphoscoliosis  LOWER EXTREMITY MMT:  (tested in sitting on eval, 09/04/22 and 10/02/22)  MMT Right Eval Left Eval R 09/04/22 L 09/04/22 R 10/02/22 L 10/02/22  Hip flexion 4- 4- 4- 4- 4 4-  Hip extension 4 4- 4 4- 4 4-  Hip abduction 4+ 4 4+ 4+ 4+ 4+  Hip adduction 5 4+ 5 5 5 5   Hip internal rotation 4+ 4- 4+ 4+ 4+ 4+  Hip external rotation 4- 4- 4 4 4+ 4+  Knee flexion 4 4- 4+ 4+ 5 5  Knee extension 4+ 4 4+ 4+ 5 5  Ankle dorsiflexion 3+ 3+ 4- 4 4 4   Ankle plantarflexion        Ankle inversion        Ankle eversion        (Blank rows = not tested)  BED MOBILITY:  NT  TRANSFERS: Assistive device utilized: Environmental consultant - 4 wheeled  Sit to stand: SBA and CGA - CGA when performing without UE assist Stand to sit: SBA Chair to chair: SBA Floor:  NT  GAIT: Distance walked: 80 ft Assistive device utilized: Environmental consultant - 4 wheeled Level of assistance: Modified independence and SBA Gait pattern: step through pattern, decreased stride length, decreased hip/knee flexion- Right, decreased hip/knee flexion- Left, and trunk  flexed Comments:  RAMP: Level of Assistance:  NT Assistive device utilized: NT Ramp Comments:   CURB:  Level of Assistance:  NT Assistive device utilized:  NT Curb Comments:   STAIRS:  Level of Assistance: NT  Stair Negotiation Technique:   Number of Stairs:    Height of Stairs:   Comments:   FUNCTIONAL TESTS:  5 times sit to stand: 13.72 sec with B UE assist; able to complete single STS w/o UE assist but unable to maintain balance  Timed up and go (TUG): 25.22 sec with rollator - 08/14/22 10 meter walk test: 17.44 sec with rollator; Gait speed = 1.88 ft sec with rollator - 08/14/22 Berg Balance Scale: 33/56; < 36 high risk for falls (close to 100%) Dynamic Gait Index: 13/24, Scores of 19 or less are predictive of falls in older community living adults - 08/14/22  09/04/22 Berg: 40/56; 37-45 significant risk for fall (>80%)   09/07/22 TUG: 17 seconds with rollator  09/24/22: : 18.75 sec with rollator Gait speed: 1.75 ft /sec with rollator DGI: 14/24: Scores of 19 or less are predictive of falls in older community living adults  5xSTS: 19.25 sec w/o need for UE assist; 14.19 sec w/ B UE assist  PATIENT SURVEYS:  ABC scale 520 / 1600 = 32.5 %   TODAY'S TREATMENT:  10/16/22 THERAPEUTIC EXERCISE: to improve flexibility, strength and mobility.  Demonstration, verbal and tactile cues throughout for technique.  NuStep - L4 x 6 min - level dropped d/t fatigue  Seated trunk twist each direction x 10  Seated thoracic ext 10x3" Seated horizontal ABD YTB x 10  Standing shoulder flexion wall slides bil x 10 Standing anatomical postural correct back to wall Standing toe raises back to wall x 10  Knee flexion and knee extension 10# x 15 BLE  GAIT TRAINING: To normalize gait pattern and improve safety. 180 ft , 270 ft with rollator, supervision - cues for upright posture, speeds up and slows down gait speed intermittently while walking, cues for heel strike 10/12/22 THERAPEUTIC  EXERCISE: to improve flexibility, strength and mobility.  Demonstration, verbal and tactile cues throughout for technique.  NuStep - L5 x 6 min Standing R/L hip extension with looped GTB at ankles x 10 bil; UE support on locked rollator for balance  Standing R/L hip abduction with looped GTB at ankles x 10 bil; UE support on locked rollator for balance   NEUROMUSCULAR RE-EDUCATION: To improve posture, balance, coordination, reduce fall risk, and reduce rigidity. Alt fwd step with hands on back of 2 chairs focusing on trunk extension and promoting upright posture x 20 Alt backward step with hands on back of 2 chairs x 20 Alt fwd step over 1/2 FR to increase hip and knee flexion and foot clearance during steps with hands on back of 2 chairs focusing on trunk extension and promoting upright posture x 20 PWR! Sit to Stand x 10 Standing PWR! Moves: Up x 10 - focusing on trunk extension and shoulder extension/scap retraction with YTB resistance Rock x 10 - one hand on back of chair for balance Twist x 10 - one hand on back of chair for balance, pulling back with YTB on side of rotation Step x 10 - one hand on back of chair for balance Alt toe clears to 9" stool x 20, initially with B UE support on back of 2 chairs, weaning to single UE support, then no UE support for last 10 reps   10/10/22 THERAPEUTIC EXERCISE: to improve flexibility, strength and mobility.  Demonstration, verbal and tactile cues throughout for technique.  UBE - L1.0 x 6 minutes backward Seated RTB scap retraction into pool noodle + shoulder extension to neutral x10 Seated RTB scap retraction into pool noodle + low angel shoulder horiz ABD x 10  Seated thoracolumbar extension over back of chair with hands on back legs of chair x 10  NEUROMUSCULAR RE-EDUCATION: To improve posture, balance, coordination, reduce fall risk, and reduce rigidity. Alt fwd step with hands on  back of 2 chairs focusing on trunk extension and promoting  upright posture x 20 Alt fwd step with hands on back of 2 chairs focusing on trunk extension and promoting upright posture x 20, adding step-over 1/2 FR to increase hip and knee flexion and foot clearance during steps, 2nd set at end of session Standing PWR! Moves: Up x 10 - holding on to back of chair for squatting component & releasing chair to achieve full extension Rock x 10 - one hand on back of chair for balance Twist x 10 - one hand on back of chair for balance Step x 10 - one hand on back of chair for balance Alt backward step with hands on back of 2 chairs x 20   10/02/22 THERAPEUTIC EXERCISE: to improve flexibility, strength and mobility.  Demonstration, verbal and tactile cues throughout for technique.  NuStep - L5 x 6 min Standing R/L hip extension with looped RTB at ankles x 10 bil, with looped GTB at ankles x 10 bil; UE support on locked rollator for balance - pt substituting with knee flexion when attempted with GTB Trunk extension roll-up + scap retraction & long-arm ER into pool noodle on wall 2 x 10 Lumbar extension with forearms/hands on wall x 10 Standing YTB scap retraction + rows x 10, SBA of PT for balance Standing YTB scap retraction + shoulder extension x 10, SBA of PT for balance Standing bird dog x 10 bil with UE support on back of chair - pt having difficulty coordinating reciprocal arm and leg motions Mini-squat with chair behind for safety 2 x 10 - focusing on full trunk and hip extension on return to stand  THERAPEUTIC ACTIVITIES: LE MMT   PATIENT EDUCATION:  Education details: progress with PT and ongoing PT POC  Person educated: Patient Education method: Explanation, Demonstration, and Verbal cues Education comprehension: verbalized understanding, returned demonstration, verbal cues required, and needs further education  HOME EXERCISE PROGRAM: Access Code: [email or web address] URL: [email or web address] Date: 09/24/2022 Prepared by: Glenetta Hew  Exercises - Seated Thoracic Lumbar Extension  - 1 x daily - 7 x weekly - 2 sets - 10 reps - 3 sec hold - Seated Scapular Retraction with External Rotation  - 1 x daily - 7 x weekly - 2 sets - 10 reps - 3 sec hold - Seated Hip Abduction with Resistance  - 1 x daily - 7 x weekly - 2 sets - 10 reps - 3-5 sec hold - Seated March with Resistance  - 1 x daily - 3 x weekly - 2 sets - 10 reps - 3 sec hold - Seated Ankle Dorsiflexion with Resistance  - 1 x daily - 7 x weekly - 2 sets - 10 reps - 3 sec hold - Long Sitting Upper Back Extension Press  - 1 x daily - 7 x weekly - 2 sets - 10 reps - 3 sec hold - Seated Shoulder Row with Anchored Resistance  - 1 x daily - 7 x weekly - 2 sets - 10 reps - 3-5 hold hold - Standing Hip Extension with Resistance at Ankles and Counter Support  - 1 x daily - 3 x weekly - 2 sets - 10 reps - 3 sec hold - Marching with Resistance  - 1 x daily - 3 x weekly - 2 sets - 10 reps - 3 sec hold   ASSESSMENT:  CLINICAL IMPRESSION: Ivor "Ray" continues to note increased fatigue but showed good tolerance for exercises. Focused on postural exercises  for ROM and strength. Cues during gait to keep upright posture and heel strike on initial contact. Ray will benefit from continued skilled PT to address ongoing deficits to improve mobility and activity tolerance with decreased pain interference.   OBJECTIVE IMPAIRMENTS: Abnormal gait, decreased activity tolerance, decreased balance, decreased coordination, decreased knowledge of condition, decreased knowledge of use of DME, decreased mobility, difficulty walking, decreased ROM, decreased strength, decreased safety awareness, impaired perceived functional ability, impaired flexibility, impaired sensation, improper body mechanics, postural dysfunction, and pain.   ACTIVITY LIMITATIONS: carrying, lifting, bending, standing, squatting, stairs, transfers, bed mobility, locomotion level, and caring for others  PARTICIPATION  LIMITATIONS: meal prep, cleaning, laundry, driving, community activity, and yard work  PERSONAL FACTORS: Age, Fitness, Past/current experiences, Time since onset of injury/illness/exacerbation, and 3+ comorbidities: Paroxysmal A-fib, chronic DHF, heart murmur, generalized OA, RA, COPD/emphysema, HTN, HLD, CKD, R elbow/olecranon fracture 2019, RLS  are also affecting patient's functional outcome.   REHAB POTENTIAL: Good  CLINICAL DECISION MAKING: Evolving/moderate complexity  EVALUATION COMPLEXITY: Moderate   GOALS: Goals reviewed with patient? Yes  SHORT TERM GOALS: Target date: 09/05/2022  Patient will be independent with initial HEP to improve outcomes and carryover.  Baseline: TBD Goal status: MET  08/22/22    2.  Patient will be educated on strategies to decrease risk of falls.  Baseline:  Goal status: MET  09/07/22  3.  Patient will demonstrate decreased TUG time to </= 20 sec to decrease risk for falls with transitional mobility. Baseline: 25.22 sec with rollator  Goal status: MET  09/07/22 - 17 sec with rollator  LONG TERM GOALS: Target date: 10/03/2022, extended to 11/05/2022  Patient will be independent with advanced/ongoing HEP to facilitate ability to maintain/progress functional gains from skilled physical therapy services. Baseline: Goal status: IN PROGRESS  09/24/22 - HEP updated today  2.  Patient will be able to step up/down curb safely with LRAD for safety with community ambulation and increased ease of access to sunken room in home.  Baseline:  Goal status: IN PROGRESS   3.  Patient will demonstrate improved functional LE strength as demonstrated by ability to independently achieve sit to stand without UE assist or loss of balance. Baseline: Requires B UE assist for majority of sit to stand; w/o UE assist, he requires increased momentum with LOB observed upon reaching standing Goal status: MET  09/24/22  4.  Patient will improve 5x STS time to </= 14.8 seconds w/o UE  assist for improved efficiency and safety with transfers per age-matched norms. Baseline: 13.72 sec with B UE assist; able to complete single STS w/o UE assist but unable to maintain balance Goal status: IN PROGRESS  09/24/22 - 19.25 sec w/o need for UE assist (no LOB); 14.19 sec w/ B UE assist  5.  Patient will demonstrate gait speed of >/= 2.62 ft/sec (0.8 m/s) to be a safe community ambulator with decreased risk for recurrent falls.  Baseline: 1.88 ft/sec with rollator Goal status: IN PROGRESS  09/24/22 - 1.75 ft/sec with rollator  6.  Patient will improve Berg score to >/= 41/56 to improve safety and stability with ADLs in standing and reduce risk for falls. (MCID= 8 points)  Baseline: 33/56  (43/56 as of DC from PT in 2019) Goal status: IN PROGRESS  09/04/22 - Berg: 40/56  7.  Patient will demonstrate at least 19/24 on DGI to improve gait stability and reduce risk for falls. Baseline: 13/24 on 08/14/22  (20/24 as of DC from PT in 2019)  Goal status: IN PROGRESS  09/24/22 - DGI: 14/24  8  Patient will report >/= 50% on ABC scale to demonstrate improved functional ability. Baseline: 520 / 1600 = 32.5 % Goal status: IN PROGRESS   PLAN:  PT FREQUENCY: 1-2x/week, starting at 2x/wk, but reducing frequency to 1x/wk if too overwhelmed  PT DURATION: 6 weeks  PLANNED INTERVENTIONS: Therapeutic exercises, Therapeutic activity, Neuromuscular re-education, Balance training, Gait training, Patient/Family education, Self Care, Joint mobilization, Stair training, DME instructions, Dry Needling, Electrical stimulation, Cryotherapy, Moist heat, Taping, Ultrasound, Manual therapy, and Re-evaluation  PLAN FOR NEXT SESSION:  curb negotiation (weather permitting); postural and core/LE strengthening working towards more standing exercises/activities; balance training, integrating components of [place] fall prevention program and PWR! Moves  Darleene Cleaver, PTA 10/16/22 2:02 PM

## 2022-10-19 ENCOUNTER — Encounter: Payer: Self-pay | Admitting: Physical Therapy

## 2022-10-19 ENCOUNTER — Ambulatory Visit: Payer: Medicare Other | Admitting: Physical Therapy

## 2022-10-19 DIAGNOSIS — R2681 Unsteadiness on feet: Secondary | ICD-10-CM | POA: Diagnosis not present

## 2022-10-19 DIAGNOSIS — M6281 Muscle weakness (generalized): Secondary | ICD-10-CM

## 2022-10-19 DIAGNOSIS — R2689 Other abnormalities of gait and mobility: Secondary | ICD-10-CM

## 2022-10-19 DIAGNOSIS — R296 Repeated falls: Secondary | ICD-10-CM

## 2022-10-19 NOTE — Therapy (Signed)
OUTPATIENT PHYSICAL THERAPY TREATMENT     Patient Name: Cory Gonzalez MRN: [id number] DOB:[birth year], 89 y.o., male Today's Date: 10/19/2022   END OF SESSION:  PT End of Session - 10/19/22 1100     Visit Number 16    Date for PT Re-Evaluation 11/05/22    Authorization Type Medicare & BCBS    Progress Note Due on Visit 20    PT Start Time 1100    PT Stop Time 1146    PT Time Calculation (min) 46 min    Activity Tolerance Patient tolerated treatment well    Behavior During Therapy WFL for tasks assessed/performed                      Past Medical History:  Diagnosis Date   Chronic diastolic heart failure (HCC)    Dysrhythmia    A-fib   FUO (fever of unknown origin) 07/27/2019   Heart murmur    since childhood, never has had any problems   Hypertension    Paroxysmal atrial fibrillation (HCC)    Pneumonia    PONV (postoperative nausea and vomiting)    a little nausea   Rheumatoid arthritis (HCC)    Tick bite 07/27/2019   Past Surgical History:  Procedure Laterality Date   CATARACT EXTRACTION Bilateral    COLONOSCOPY     EXCISION MASS HEAD Left 03/29/2022   Procedure: EXCISION OF EAR MOH'S DEFECT;  Surgeon: Scarlette Ar, MD;  Location: [facility] OR;  Service: ENT;  Laterality: Left;   NASAL FLAP ROTATION Left 03/29/2022   Procedure: POSSIBLE FLAP ROTATION; POSSIBLE EAR CARTILAGE GRAFT;  Surgeon: Scarlette Ar, MD;  Location: [facility] OR;  Service: ENT;  Laterality: Left;   ORIF ELBOW FRACTURE Right 09/18/2017   Procedure: OPEN REDUCTION INTERNAL FIXATION (ORIF) RIGHT ELBOW/OLECRANON FRACTURE;  Surgeon: Eldred Manges, MD;  Location: [facility] OR;  Service: Orthopedics;  Laterality: Right;   PROSTATE SURGERY     SKIN FULL THICKNESS GRAFT Left 03/29/2022   Procedure: ADJACENT TISSUE TRANSFER;  SKIN GRAFT;  Surgeon: Scarlette Ar, MD;  Location: [facility] OR;  Service: ENT;  Laterality: Left;   Patient Active Problem List   Diagnosis Date Noted   FUO (fever of unknown origin) 07/27/2019    Tick bite 07/27/2019   Pneumothorax on right 09/19/2017   Olecranon fracture 09/18/2017   Blepharitis of upper and lower eyelids of both eyes 04/19/2016   Pseudophakia of both eyes 04/19/2016   TB lung, latent 06/05/2011   Ocular hypertension, 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   Rheumatoid arthritis (HCC) 02/16/2004    PCP: [facility]   REFERRING PROVIDER:  Georgianne Fick, MD   REFERRING DIAG: R26.9 (ICD-10-CM) - Gait abnormality    THERAPY DIAG:  Repeated falls  Unsteadiness on feet  Other abnormalities of gait and mobility  Muscle weakness (generalized)  RATIONALE FOR EVALUATION AND TREATMENT: Rehabilitation  ONSET DATE: Progressive decline over past few years  NEXT MD VISIT: 02/20/23   SUBJECTIVE:  SUBJECTIVE STATEMENT: Pt reports he is not as wiped out as last time but still states "my energy has got up and gone".   Pt accompanied by: significant other - wife - [name] (in waiting room)  PAIN: Are you having pain? No   PERTINENT HISTORY:  Paroxysmal A-fib, chronic DHF, heart murmur, generalized OA, RA, COPD/emphysema, HTN, HLD, CKD, R elbow/olecranon fracture 2019, RLS  PRECAUTIONS: Fall  WEIGHT BEARING RESTRICTIONS: No  FALLS:  Has patient fallen in last 6 months? Yes. Number of falls >6  LIVING ENVIRONMENT: Lives with: lives with their spouse Lives in: House/apartment Stairs: Yes: Internal: 1 steps; sunken room and External: 1 steps; ramp Has following equipment at home: Single point cane, Walker - 2 wheeled, Environmental consultant - 4 wheeled, Wheelchair (manual), Ramped entry, and transport chair  OCCUPATION: Retired  PLOF: Independent and Leisure: most sedentary  PATIENT GOALS: "To do better with my balance, my walking and getting up out of a  chair."   OBJECTIVE: (objective measures completed at initial evaluation unless otherwise dated)  DIAGNOSTIC FINDINGS:  03/13/22 - CT cervical spine s/p fall: IMPRESSION: 1. No evidence of acute fracture to the cervical spine. 2. Nonspecific straightening of the expected cervical lordosis. 3. Grade 1 anterolisthesis at C2-C3, C3-C4, C6-C7 and T1-T2. 4. Cervical spondylosis, as described.  03/13/22 Head CT s/p fall with trauma to the head and face: IMPRESSION:  No acute or traumatic finding. Age related volume loss and chronic small-vessel ischemic change of the white matter.  COGNITION: Overall cognitive status: Within functional limits for tasks assessed   SENSATION: B LE RLS  COORDINATION: Slowed reciprocal movements  POSTURE:  rounded shoulders, forward head, decreased lumbar lordosis, increased thoracic kyphosis, flexed trunk , and significant kyphoscoliosis  LOWER EXTREMITY MMT:  (tested in sitting on eval, 09/04/22 and 10/02/22)  MMT Right Eval Left Eval R 09/04/22 L 09/04/22 R 10/02/22 L 10/02/22  Hip flexion 4- 4- 4- 4- 4 4-  Hip extension 4 4- 4 4- 4 4-  Hip abduction 4+ 4 4+ 4+ 4+ 4+  Hip adduction 5 4+ 5 5 5 5   Hip internal rotation 4+ 4- 4+ 4+ 4+ 4+  Hip external rotation 4- 4- 4 4 4+ 4+  Knee flexion 4 4- 4+ 4+ 5 5  Knee extension 4+ 4 4+ 4+ 5 5  Ankle dorsiflexion 3+ 3+ 4- 4 4 4   Ankle plantarflexion        Ankle inversion        Ankle eversion        (Blank rows = not tested)  BED MOBILITY:  NT  TRANSFERS: Assistive device utilized: Environmental consultant - 4 wheeled  Sit to stand: SBA and CGA - CGA when performing without UE assist Stand to sit: SBA Chair to chair: SBA Floor:  NT  GAIT: Distance walked: 80 ft Assistive device utilized: Environmental consultant - 4 wheeled Level of assistance: Modified independence and SBA Gait pattern: step through pattern, decreased stride length, decreased hip/knee flexion- Right, decreased hip/knee flexion- Left, and trunk  flexed Comments:  RAMP: Level of Assistance:  NT Assistive device utilized: NT Ramp Comments:   CURB:  Level of Assistance:  NT Assistive device utilized:  NT Curb Comments:   STAIRS:  Level of Assistance: NT  Stair Negotiation Technique:   Number of Stairs:    Height of Stairs:   Comments:   FUNCTIONAL TESTS:  5 times sit to stand: 13.72 sec with B UE assist; able to complete single STS w/o UE assist but  unable to maintain balance Timed up and go (TUG): 25.22 sec with rollator - 08/14/22 10 meter walk test: 17.44 sec with rollator; Gait speed = 1.88 ft sec with rollator - 08/14/22 Sharlene Motts Balance Scale: 33/56; < 36 high risk for falls (close to 100%) Dynamic Gait Index: 13/24, Scores of 19 or less are predictive of falls in older community living adults - 08/14/22  09/04/22 Berg: 40/56; 37-45 significant risk for fall (>80%)   09/07/22 TUG: 17 seconds with rollator  09/24/22: : 18.75 sec with rollator Gait speed: 1.75 ft /sec with rollator DGI: 14/24: Scores of 19 or less are predictive of falls in older community living adults  5xSTS: 19.25 sec w/o need for UE assist; 14.19 sec w/ B UE assist  PATIENT SURVEYS:  ABC scale 520 / 1600 = 32.5 %   TODAY'S TREATMENT:   10/19/22 THERAPEUTIC EXERCISE: to improve flexibility, strength and mobility.  Demonstration, verbal and tactile cues throughout for technique.  NuStep - L5 x 6 min  Standing RTB scap retraction + B shoulder row 10 x 3" - cues for upright posture Standing RTB scap retraction + B shoulder extension 10 x 3" Standing RTB pallof press 10 x 3" bil  GAIT TRAINING: To normalize gait pattern and improve safety with rollator . 300' with rollator indoors and outdoors, including elevator access and up/down curb - cues necessary for upright posture and rollator proximity as well as safe approach to curb and proper use of brakes while stepping up/down at curb  NEUROMUSCULAR RE-EDUCATION: To improve posture, balance,  coordination, and reduce fall risk.  Standing PWR! Moves: Up x 10 - chair in front for safety Rock x 10  Twist x 10 - one hand on back of chair for balance, pulling back with YTB on side of rotation Step x 10 - one hand on back of chair for balance Alt toe clears to 9" stool x 20, initially with B UE support on back of 2 chairs, weaning to single UE support, then no UE support for last 10 reps   10/16/22 THERAPEUTIC EXERCISE: to improve flexibility, strength and mobility.  Demonstration, verbal and tactile cues throughout for technique.  NuStep - L4 x 6 min - level dropped d/t fatigue  Seated trunk twist each direction x 10  Seated thoracic ext 10x3" Seated horizontal ABD YTB x 10  Standing shoulder flexion wall slides bil x 10 Standing anatomical postural correct back to wall Standing toe raises back to wall x 10  Knee flexion and knee extension 10# x 15 BLE  GAIT TRAINING: To normalize gait pattern and improve safety. 180 ft , 270 ft with rollator, supervision - cues for upright posture, speeds up and slows down gait speed intermittently while walking, cues for heel strike   10/12/22 THERAPEUTIC EXERCISE: to improve flexibility, strength and mobility.  Demonstration, verbal and tactile cues throughout for technique.  NuStep - L5 x 6 min Standing R/L hip extension with looped GTB at ankles x 10 bil; UE support on locked rollator for balance  Standing R/L hip abduction with looped GTB at ankles x 10 bil; UE support on locked rollator for balance   NEUROMUSCULAR RE-EDUCATION: To improve posture, balance, coordination, reduce fall risk, and reduce rigidity. Alt fwd step with hands on back of 2 chairs focusing on trunk extension and promoting upright posture x 20 Alt backward step with hands on back of 2 chairs x 20 Alt fwd step over 1/2 FR to increase hip and knee flexion and  foot clearance during steps with hands on back of 2 chairs focusing on trunk extension and promoting upright  posture x 20 PWR! Sit to Stand x 10 Standing PWR! Moves: Up x 10 - focusing on trunk extension and shoulder extension/scap retraction with YTB resistance Rock x 10 - one hand on back of chair for balance Twist x 10 - one hand on back of chair for balance, pulling back with YTB on side of rotation Step x 10 - one hand on back of chair for balance Alt toe clears to 9" stool x 20, initially with B UE support on back of 2 chairs, weaning to single UE support, then no UE support for last 10 reps   PATIENT EDUCATION:  Education details: gait safety with curb negotiation with rollator   Person educated: Patient Education method: Explanation, Demonstration, and Verbal cues Education comprehension: verbalized understanding, returned demonstration, verbal cues required, and needs further education  HOME EXERCISE PROGRAM: Access Code: [email or web address] URL: [email or web address] Date: 09/24/2022 Prepared by: Glenetta Hew  Exercises - Seated Thoracic Lumbar Extension  - 1 x daily - 7 x weekly - 2 sets - 10 reps - 3 sec hold - Seated Scapular Retraction with External Rotation  - 1 x daily - 7 x weekly - 2 sets - 10 reps - 3 sec hold - Seated Hip Abduction with Resistance  - 1 x daily - 7 x weekly - 2 sets - 10 reps - 3-5 sec hold - Seated March with Resistance  - 1 x daily - 3 x weekly - 2 sets - 10 reps - 3 sec hold - Seated Ankle Dorsiflexion with Resistance  - 1 x daily - 7 x weekly - 2 sets - 10 reps - 3 sec hold - Long Sitting Upper Back Extension Press  - 1 x daily - 7 x weekly - 2 sets - 10 reps - 3 sec hold - Seated Shoulder Row with Anchored Resistance  - 1 x daily - 7 x weekly - 2 sets - 10 reps - 3-5 hold hold - Standing Hip Extension with Resistance at Ankles and Counter Support  - 1 x daily - 3 x weekly - 2 sets - 10 reps - 3 sec hold - Marching with Resistance  - 1 x daily - 3 x weekly - 2 sets - 10 reps - 3 sec hold   ASSESSMENT:  CLINICAL IMPRESSION: Cory "Ray" reports  he feels better than last visit but still feels like he lacks energy all the time. Discussed need for regular activity (I.e. walking) and exercise to help build energy and activity tolerance. He continues to demonstrate decreased safety awareness with rollator in regards to proper use of brakes (locking them after transfers rather than before) as well as demonstrating continued poor rollator proximity leading to instability at times, esp while turning. We reviewed safe use of rollator for curb/single step negotiation to ensure safe access to sunken room in home but PT having to guide him step-by-step through the approach and technique. Remainder of session focusing on postural strengthening and PWR! Moves to facilitate improved functional posture with weight shifting and stepping to reduce fall risk.  Ray will benefit from continued skilled PT to address ongoing deficits to improve mobility and activity tolerance with decreased pain interference.   OBJECTIVE IMPAIRMENTS: Abnormal gait, decreased activity tolerance, decreased balance, decreased coordination, decreased knowledge of condition, decreased knowledge of use of DME, decreased mobility, difficulty walking, decreased ROM, decreased strength, decreased safety awareness, impaired perceived  functional ability, impaired flexibility, impaired sensation, improper body mechanics, postural dysfunction, and pain.   ACTIVITY LIMITATIONS: carrying, lifting, bending, standing, squatting, stairs, transfers, bed mobility, locomotion level, and caring for others  PARTICIPATION LIMITATIONS: meal prep, cleaning, laundry, driving, community activity, and yard work  PERSONAL FACTORS: Age, Fitness, Past/current experiences, Time since onset of injury/illness/exacerbation, and 3+ comorbidities: Paroxysmal A-fib, chronic DHF, heart murmur, generalized OA, RA, COPD/emphysema, HTN, HLD, CKD, R elbow/olecranon fracture 2019, RLS  are also affecting patient's functional  outcome.   REHAB POTENTIAL: Good  CLINICAL DECISION MAKING: Evolving/moderate complexity  EVALUATION COMPLEXITY: Moderate   GOALS: Goals reviewed with patient? Yes  SHORT TERM GOALS: Target date: 09/05/2022  Patient will be independent with initial HEP to improve outcomes and carryover.  Baseline: TBD Goal status: MET  08/22/22    2.  Patient will be educated on strategies to decrease risk of falls.  Baseline:  Goal status: MET  09/07/22  3.  Patient will demonstrate decreased TUG time to </= 20 sec to decrease risk for falls with transitional mobility. Baseline: 25.22 sec with rollator  Goal status: MET  09/07/22 - 17 sec with rollator  LONG TERM GOALS: Target date: 10/03/2022, extended to 11/05/2022  Patient will be independent with advanced/ongoing HEP to facilitate ability to maintain/progress functional gains from skilled physical therapy services. Baseline: Goal status: IN PROGRESS  09/24/22 - HEP updated today  2.  Patient will be able to step up/down curb safely with LRAD for safety with community ambulation and increased ease of access to sunken room in home.  Baseline:  Goal status: IN PROGRESS  10/19/22 - cues necessary for safe approach and appropriate use of rollator brakes  3.  Patient will demonstrate improved functional LE strength as demonstrated by ability to independently achieve sit to stand without UE assist or loss of balance. Baseline: Requires B UE assist for majority of sit to stand; w/o UE assist, he requires increased momentum with LOB observed upon reaching standing Goal status: MET  09/24/22  4.  Patient will improve 5x STS time to </= 14.8 seconds w/o UE assist for improved efficiency and safety with transfers per age-matched norms. Baseline: 13.72 sec with B UE assist; able to complete single STS w/o UE assist but unable to maintain balance Goal status: IN PROGRESS  09/24/22 - 19.25 sec w/o need for UE assist (no LOB); 14.19 sec w/ B UE assist  5.  Patient  will demonstrate gait speed of >/= 2.62 ft/sec (0.8 m/s) to be a safe community ambulator with decreased risk for recurrent falls.  Baseline: 1.88 ft/sec with rollator Goal status: IN PROGRESS  09/24/22 - 1.75 ft/sec with rollator  6.  Patient will improve Berg score to >/= 41/56 to improve safety and stability with ADLs in standing and reduce risk for falls. (MCID= 8 points)  Baseline: 33/56  (43/56 as of DC from PT in 2019) Goal status: IN PROGRESS  09/04/22 - Berg: 40/56  7.  Patient will demonstrate at least 19/24 on DGI to improve gait stability and reduce risk for falls. Baseline: 13/24 on 08/14/22  (20/24 as of DC from PT in 2019) Goal status: IN PROGRESS  09/24/22 - DGI: 14/24  8  Patient will report >/= 50% on ABC scale to demonstrate improved functional ability. Baseline: 520 / 1600 = 32.5 % Goal status: IN PROGRESS   PLAN:  PT FREQUENCY: 1-2x/week, starting at 2x/wk, but reducing frequency to 1x/wk if too overwhelmed  PT DURATION: 6 weeks  PLANNED  INTERVENTIONS: Therapeutic exercises, Therapeutic activity, Neuromuscular re-education, Balance training, Gait training, Patient/Family education, Self Care, Joint mobilization, Stair training, DME instructions, Dry Needling, Electrical stimulation, Cryotherapy, Moist heat, Taping, Ultrasound, Manual therapy, and Re-evaluation  PLAN FOR NEXT SESSION:  review of curb negotiation (weather permitting); postural and core/LE strengthening working towards more standing exercises/activities; balance training, integrating components of [place] fall prevention program and PWR! Moves  Marry Guan, [place] 10/19/22 12:59 PM

## 2022-10-22 DIAGNOSIS — R972 Elevated prostate specific antigen [PSA]: Secondary | ICD-10-CM | POA: Diagnosis not present

## 2022-10-23 ENCOUNTER — Encounter: Payer: Self-pay | Admitting: Physical Therapy

## 2022-10-23 ENCOUNTER — Ambulatory Visit: Payer: Medicare Other | Admitting: Physical Therapy

## 2022-10-23 DIAGNOSIS — R2681 Unsteadiness on feet: Secondary | ICD-10-CM | POA: Diagnosis not present

## 2022-10-23 DIAGNOSIS — M6281 Muscle weakness (generalized): Secondary | ICD-10-CM

## 2022-10-23 DIAGNOSIS — R296 Repeated falls: Secondary | ICD-10-CM | POA: Diagnosis not present

## 2022-10-23 DIAGNOSIS — R2689 Other abnormalities of gait and mobility: Secondary | ICD-10-CM

## 2022-10-23 NOTE — Therapy (Signed)
OUTPATIENT PHYSICAL THERAPY TREATMENT     Patient Name: Cory Gonzalez MRN: [id number] DOB:18-May-1929, 89 y.o., male Today's Date: 10/23/2022   END OF SESSION:  PT End of Session - 10/23/22 1110     Visit Number 17    Date for PT Re-Evaluation 11/05/22    Authorization Type Medicare & BCBS    Progress Note Due on Visit 20    PT Start Time 1110    PT Stop Time 1148    PT Time Calculation (min) 38 min    Activity Tolerance Patient tolerated treatment well    Behavior During Therapy WFL for tasks assessed/performed                       Past Medical History:  Diagnosis Date   Chronic diastolic heart failure (HCC)    Dysrhythmia    A-fib   FUO (fever of unknown origin) 07/27/2019   Heart murmur    since childhood, never has had any problems   Hypertension    Paroxysmal atrial fibrillation (HCC)    Pneumonia    PONV (postoperative nausea and vomiting)    a little nausea   Rheumatoid arthritis (HCC)    Tick bite 07/27/2019   Past Surgical History:  Procedure Laterality Date   CATARACT EXTRACTION Bilateral    COLONOSCOPY     EXCISION MASS HEAD Left 03/29/2022   Procedure: EXCISION OF EAR MOH'S DEFECT;  Surgeon: Scarlette Ar, MD;  Location: [facility] OR;  Service: ENT;  Laterality: Left;   NASAL FLAP ROTATION Left 03/29/2022   Procedure: POSSIBLE FLAP ROTATION; POSSIBLE EAR CARTILAGE GRAFT;  Surgeon: Scarlette Ar, MD;  Location: [facility] OR;  Service: ENT;  Laterality: Left;   ORIF ELBOW FRACTURE Right 09/18/2017   Procedure: OPEN REDUCTION INTERNAL FIXATION (ORIF) RIGHT ELBOW/OLECRANON FRACTURE;  Surgeon: Eldred Manges, MD;  Location: [facility] OR;  Service: Orthopedics;  Laterality: Right;   PROSTATE SURGERY     SKIN FULL THICKNESS GRAFT Left 03/29/2022   Procedure: ADJACENT TISSUE TRANSFER;  SKIN GRAFT;  Surgeon: Scarlette Ar, MD;  Location: [facility];  Service: ENT;  Laterality: Left;   Patient Active Problem List   Diagnosis Date Noted   FUO (fever of unknown origin)  07/27/2019   Tick bite 07/27/2019   Pneumothorax on right 09/19/2017   Olecranon fracture 09/18/2017   Blepharitis of upper and lower eyelids of both eyes 04/19/2016   Pseudophakia of both eyes 04/19/2016   TB lung, latent 06/05/2011   Ocular hypertension, 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   Rheumatoid arthritis (HCC) 02/16/2004    PCP: [facility]   REFERRING PROVIDER:  Georgianne Fick, MD   REFERRING DIAG: R26.9 (ICD-10-CM) - Gait abnormality    THERAPY DIAG:  Repeated falls  Unsteadiness on feet  Other abnormalities of gait and mobility  Muscle weakness (generalized)  RATIONALE FOR EVALUATION AND TREATMENT: Rehabilitation  ONSET DATE: Progressive decline over past few years  NEXT MD VISIT: 02/20/23   SUBJECTIVE:  SUBJECTIVE STATEMENT: Pt reports he feels like he can pick his feet up better since he has started PT.   Pt accompanied by: significant other - wife - Cory Gonzalez (in waiting room)  PAIN: Are you having pain? No   PERTINENT HISTORY:  Paroxysmal A-fib, chronic DHF, heart murmur, generalized OA, RA, COPD/emphysema, HTN, HLD, CKD, R elbow/olecranon fracture 2019, RLS  PRECAUTIONS: Fall  WEIGHT BEARING RESTRICTIONS: No  FALLS:  Has patient fallen in last 6 months? Yes. Number of falls >6  LIVING ENVIRONMENT: Lives with: lives with their spouse Lives in: House/apartment Stairs: Yes: Internal: 1 steps; sunken room and External: 1 steps; ramp Has following equipment at home: Single point cane, Walker - 2 wheeled, Environmental consultant - 4 wheeled, Wheelchair (manual), Ramped entry, and transport chair  OCCUPATION: Retired  PLOF: Independent and Leisure: most sedentary  PATIENT GOALS: "To do better with my balance, my walking and getting up out of a  chair."   OBJECTIVE: (objective measures completed at initial evaluation unless otherwise dated)  DIAGNOSTIC FINDINGS:  03/13/22 - CT cervical spine s/p fall: IMPRESSION: 1. No evidence of acute fracture to the cervical spine. 2. Nonspecific straightening of the expected cervical lordosis. 3. Grade 1 anterolisthesis at C2-C3, C3-C4, C6-C7 and T1-T2. 4. Cervical spondylosis, as described.  03/13/22 Head CT s/p fall with trauma to the head and face: IMPRESSION:  No acute or traumatic finding. Age related volume loss and chronic small-vessel ischemic change of the white matter.  COGNITION: Overall cognitive status: Within functional limits for tasks assessed   SENSATION: B LE RLS  COORDINATION: Slowed reciprocal movements  POSTURE:  rounded shoulders, forward head, decreased lumbar lordosis, increased thoracic kyphosis, flexed trunk , and significant kyphoscoliosis  LOWER EXTREMITY MMT:  (tested in sitting on eval, 09/04/22 and 10/02/22)  MMT Right Eval Left Eval R 09/04/22 L 09/04/22 R 10/02/22 L 10/02/22  Hip flexion 4- 4- 4- 4- 4 4-  Hip extension 4 4- 4 4- 4 4-  Hip abduction 4+ 4 4+ 4+ 4+ 4+  Hip adduction 5 4+ 5 5 5 5   Hip internal rotation 4+ 4- 4+ 4+ 4+ 4+  Hip external rotation 4- 4- 4 4 4+ 4+  Knee flexion 4 4- 4+ 4+ 5 5  Knee extension 4+ 4 4+ 4+ 5 5  Ankle dorsiflexion 3+ 3+ 4- 4 4 4   Ankle plantarflexion        Ankle inversion        Ankle eversion        (Blank rows = not tested)  BED MOBILITY:  NT  TRANSFERS: Assistive device utilized: Environmental consultant - 4 wheeled  Sit to stand: SBA and CGA - CGA when performing without UE assist Stand to sit: SBA Chair to chair: SBA Floor:  NT  GAIT: Distance walked: 80 ft Assistive device utilized: Environmental consultant - 4 wheeled Level of assistance: Modified independence and SBA Gait pattern: step through pattern, decreased stride length, decreased hip/knee flexion- Right, decreased hip/knee flexion- Left, and trunk  flexed Comments:  RAMP: Level of Assistance:  NT Assistive device utilized: NT Ramp Comments:   CURB:  Level of Assistance:  NT Assistive device utilized:  NT Curb Comments:   STAIRS:  Level of Assistance: NT  Stair Negotiation Technique:   Number of Stairs:    Height of Stairs:   Comments:   FUNCTIONAL TESTS:  5 times sit to stand: 13.72 sec with B UE assist; able to complete single STS w/o UE assist but unable to maintain balance  Timed up and go (TUG): 25.22 sec with rollator - 08/14/22 10 meter walk test: 17.44 sec with rollator; Gait speed = 1.88 ft sec with rollator - 08/14/22 Berg Balance Scale: 33/56; < 36 high risk for falls (close to 100%) Dynamic Gait Index: 13/24, Scores of 19 or less are predictive of falls in older community living adults - 08/14/22  09/04/22 Berg: 40/56; 37-45 significant risk for fall (>80%)   09/07/22 TUG: 17 seconds with rollator  09/24/22: : 18.75 sec with rollator Gait speed: 1.75 ft /sec with rollator DGI: 14/24: Scores of 19 or less are predictive of falls in older community living adults  5xSTS: 19.25 sec w/o need for UE assist; 14.19 sec w/ B UE assist  PATIENT SURVEYS:  ABC scale 520 / 1600 = 32.5 %   TODAY'S TREATMENT:   10/23/22 GAIT TRAINING: To normalize gait pattern and improve safety with 4WW .  360 ft with 4WW/rollator - cues for posture and walker proximity as well as increased hip and knee flexion with heel strike on weight acceptance to improve foot clearance  THERAPEUTIC EXERCISE: to improve flexibility, strength and mobility.  Demonstration, verbal and tactile cues throughout for technique.  Seated RTB scap retraction + B shoulder row 10 x 3" - cues for upright posture Seated RTB scap retraction + B shoulder extension 10 x 3"  NEUROMUSCULAR RE-EDUCATION: To improve posture, balance, proprioception, coordination, and reduce fall risk. CGA of PT via gait belt.  Static stance on Airex pad x 30 sec Weight shifting  (lateral and ant/post) on Airex pad 2 x 10 each Reaching for and tossing bean bags x 12 Alt toe clears to Airex pad x 20 Staggered stance with fwd foot on Airex pad x 30 sec with each foot fwd   10/19/22 THERAPEUTIC EXERCISE: to improve flexibility, strength and mobility.  Demonstration, verbal and tactile cues throughout for technique.  NuStep - L5 x 6 min  Standing RTB scap retraction + B shoulder row 10 x 3" - cues for upright posture Standing RTB scap retraction + B shoulder extension 10 x 3" Standing RTB pallof press 10 x 3" bil  GAIT TRAINING: To normalize gait pattern and improve safety with rollator . 300' with rollator indoors and outdoors, including elevator access and up/down curb - cues necessary for upright posture and rollator proximity as well as safe approach to curb and proper use of brakes while stepping up/down at curb  NEUROMUSCULAR RE-EDUCATION: To improve posture, balance, coordination, and reduce fall risk.  Standing PWR! Moves: Up x 10 - chair in front for safety Rock x 10  Twist x 10 - one hand on back of chair for balance, pulling back with YTB on side of rotation Step x 10 - one hand on back of chair for balance Alt toe clears to 9" stool x 20, initially with B UE support on back of 2 chairs, weaning to single UE support, then no UE support for last 10 reps   10/16/22 THERAPEUTIC EXERCISE: to improve flexibility, strength and mobility.  Demonstration, verbal and tactile cues throughout for technique.  NuStep - L4 x 6 min - level dropped d/t fatigue  Seated trunk twist each direction x 10  Seated thoracic ext 10x3" Seated horizontal ABD YTB x 10  Standing shoulder flexion wall slides bil x 10 Standing anatomical postural correct back to wall Standing toe raises back to wall x 10  Knee flexion and knee extension 10# x 15 BLE  GAIT TRAINING: To normalize  gait pattern and improve safety. 180 ft , 270 ft with rollator, supervision - cues for upright posture,  speeds up and slows down gait speed intermittently while walking, cues for heel strike   10/12/22 THERAPEUTIC EXERCISE: to improve flexibility, strength and mobility.  Demonstration, verbal and tactile cues throughout for technique.  NuStep - L5 x 6 min Standing R/L hip extension with looped GTB at ankles x 10 bil; UE support on locked rollator for balance  Standing R/L hip abduction with looped GTB at ankles x 10 bil; UE support on locked rollator for balance   NEUROMUSCULAR RE-EDUCATION: To improve posture, balance, coordination, reduce fall risk, and reduce rigidity. Alt fwd step with hands on back of 2 chairs focusing on trunk extension and promoting upright posture x 20 Alt backward step with hands on back of 2 chairs x 20 Alt fwd step over 1/2 FR to increase hip and knee flexion and foot clearance during steps with hands on back of 2 chairs focusing on trunk extension and promoting upright posture x 20 PWR! Sit to Stand x 10 Standing PWR! Moves: Up x 10 - focusing on trunk extension and shoulder extension/scap retraction with YTB resistance Rock x 10 - one hand on back of chair for balance Twist x 10 - one hand on back of chair for balance, pulling back with YTB on side of rotation Step x 10 - one hand on back of chair for balance Alt toe clears to 9" stool x 20, initially with B UE support on back of 2 chairs, weaning to single UE support, then no UE support for last 10 reps   PATIENT EDUCATION:  Education details: HEP progression - seated rows/shoulder extension progressed to RTB and gait safety with curb negotiation with rollator   Person educated: Patient Education method: Explanation, Demonstration, and Verbal cues Education comprehension: verbalized understanding, returned demonstration, verbal cues required, and needs further education  HOME EXERCISE PROGRAM: Access Code: [email or web address] URL: [email or web address] Date: 09/24/2022 Prepared by: Glenetta Hew  Exercises - Seated Thoracic Lumbar Extension  - 1 x daily - 7 x weekly - 2 sets - 10 reps - 3 sec hold - Seated Scapular Retraction with External Rotation  - 1 x daily - 7 x weekly - 2 sets - 10 reps - 3 sec hold - Seated Hip Abduction with Resistance  - 1 x daily - 7 x weekly - 2 sets - 10 reps - 3-5 sec hold - Seated March with Resistance  - 1 x daily - 3 x weekly - 2 sets - 10 reps - 3 sec hold - Seated Ankle Dorsiflexion with Resistance  - 1 x daily - 7 x weekly - 2 sets - 10 reps - 3 sec hold - Long Sitting Upper Back Extension Press  - 1 x daily - 7 x weekly - 2 sets - 10 reps - 3 sec hold - Seated Shoulder Row with Anchored Resistance  - 1 x daily - 7 x weekly - 2 sets - 10 reps - 3-5 hold hold - Standing Hip Extension with Resistance at Ankles and Counter Support  - 1 x daily - 3 x weekly - 2 sets - 10 reps - 3 sec hold - Marching with Resistance  - 1 x daily - 3 x weekly - 2 sets - 10 reps - 3 sec hold   ASSESSMENT:  CLINICAL IMPRESSION: Cory "Ray" reports when he performs the HEP exercises at home it seems to perk him up some, but  he states the days he comes to PT is usually tired afterward.  We were able to progress his seated scapular strengthening exercises to RTB today with good tolerance.  He reports he has been using GTB for the lower extremity exercises without any issues at home.  He continues to require frequent cues for proper posture and good rollator proximity while ambulating as well as increased hip and knee flexion for improved foot clearance.  Remainder of session focusing on balance in unsupported standing, with patient requiring CGA of PT for safety with nearly all tasks.  Ray is nearing the end of his current POC and feels like he will do best with just continuing on his own with the HEP, therefore we will plan to review and finalize HEP in the next few visits in preparation for transition to HEP.  Will also initiate balance and goal assessment in preparation for  discharge.  Ray will benefit from continued skilled PT to address ongoing deficits to improve mobility and activity tolerance with decreased risk for falls.   OBJECTIVE IMPAIRMENTS: Abnormal gait, decreased activity tolerance, decreased balance, decreased coordination, decreased knowledge of condition, decreased knowledge of use of DME, decreased mobility, difficulty walking, decreased ROM, decreased strength, decreased safety awareness, impaired perceived functional ability, impaired flexibility, impaired sensation, improper body mechanics, postural dysfunction, and pain.   ACTIVITY LIMITATIONS: carrying, lifting, bending, standing, squatting, stairs, transfers, bed mobility, locomotion level, and caring for others  PARTICIPATION LIMITATIONS: meal prep, cleaning, laundry, driving, community activity, and yard work  PERSONAL FACTORS: Age, Fitness, Past/current experiences, Time since onset of injury/illness/exacerbation, and 3+ comorbidities: Paroxysmal A-fib, chronic DHF, heart murmur, generalized OA, RA, COPD/emphysema, HTN, HLD, CKD, R elbow/olecranon fracture 2019, RLS  are also affecting patient's functional outcome.   REHAB POTENTIAL: Good  CLINICAL DECISION MAKING: Evolving/moderate complexity  EVALUATION COMPLEXITY: Moderate   GOALS: Goals reviewed with patient? Yes  SHORT TERM GOALS: Target date: 09/05/2022  Patient will be independent with initial HEP to improve outcomes and carryover.  Baseline: TBD Goal status: MET  08/22/22    2.  Patient will be educated on strategies to decrease risk of falls.  Baseline:  Goal status: MET  09/07/22  3.  Patient will demonstrate decreased TUG time to </= 20 sec to decrease risk for falls with transitional mobility. Baseline: 25.22 sec with rollator  Goal status: MET  09/07/22 - 17 sec with rollator  LONG TERM GOALS: Target date: 10/03/2022, extended to 11/05/2022  Patient will be independent with advanced/ongoing HEP to facilitate ability to  maintain/progress functional gains from skilled physical therapy services. Baseline: Goal status: IN PROGRESS  10/23/22 - HEP updated today  2.  Patient will be able to step up/down curb safely with LRAD for safety with community ambulation and increased ease of access to sunken room in home.  Baseline:  Goal status: IN PROGRESS  10/19/22 - cues necessary for safe approach and appropriate use of rollator brakes  3.  Patient will demonstrate improved functional LE strength as demonstrated by ability to independently achieve sit to stand without UE assist or loss of balance. Baseline: Requires B UE assist for majority of sit to stand; w/o UE assist, he requires increased momentum with LOB observed upon reaching standing Goal status: MET  09/24/22  4.  Patient will improve 5x STS time to </= 14.8 seconds w/o UE assist for improved efficiency and safety with transfers per age-matched norms. Baseline: 13.72 sec with B UE assist; able to complete single STS w/o UE  assist but unable to maintain balance Goal status: IN PROGRESS  09/24/22 - 19.25 sec w/o need for UE assist (no LOB); 14.19 sec w/ B UE assist  5.  Patient will demonstrate gait speed of >/= 2.62 ft/sec (0.8 m/s) to be a safe community ambulator with decreased risk for recurrent falls.  Baseline: 1.88 ft/sec with rollator Goal status: IN PROGRESS  09/24/22 - 1.75 ft/sec with rollator  6.  Patient will improve Berg score to >/= 41/56 to improve safety and stability with ADLs in standing and reduce risk for falls. (MCID= 8 points)  Baseline: 33/56  (43/56 as of DC from PT in 2019) Goal status: IN PROGRESS  09/04/22 - Berg: 40/56  7.  Patient will demonstrate at least 19/24 on DGI to improve gait stability and reduce risk for falls. Baseline: 13/24 on 08/14/22  (20/24 as of DC from PT in 2019) Goal status: IN PROGRESS  09/24/22 - DGI: 14/24  8  Patient will report >/= 50% on ABC scale to demonstrate improved functional ability. Baseline: 520 /  1600 = 32.5 % Goal status: IN PROGRESS   PLAN:  PT FREQUENCY: 1-2x/week, starting at 2x/wk, but reducing frequency to 1x/wk if too overwhelmed  PT DURATION: 6 weeks  PLANNED INTERVENTIONS: Therapeutic exercises, Therapeutic activity, Neuromuscular re-education, Balance training, Gait training, Patient/Family education, Self Care, Joint mobilization, Stair training, DME instructions, Dry Needling, Electrical stimulation, Cryotherapy, Moist heat, Taping, Ultrasound, Manual therapy, and Re-evaluation  PLAN FOR NEXT SESSION:  Review and update of HEP in prep for transition to HEP at end of current POC; review of curb negotiation (weather permitting); postural and core/LE strengthening working towards more standing exercises/activities; balance training, integrating components of [place] fall prevention program and PWR! Moves  [place], [place] 10/23/22 4:45 PM

## 2022-10-25 ENCOUNTER — Ambulatory Visit: Payer: Medicare Other | Admitting: Physical Therapy

## 2022-10-25 ENCOUNTER — Encounter: Payer: Self-pay | Admitting: Physical Therapy

## 2022-10-25 DIAGNOSIS — M6281 Muscle weakness (generalized): Secondary | ICD-10-CM | POA: Diagnosis not present

## 2022-10-25 DIAGNOSIS — R2689 Other abnormalities of gait and mobility: Secondary | ICD-10-CM | POA: Diagnosis not present

## 2022-10-25 DIAGNOSIS — R296 Repeated falls: Secondary | ICD-10-CM | POA: Diagnosis not present

## 2022-10-25 DIAGNOSIS — R2681 Unsteadiness on feet: Secondary | ICD-10-CM

## 2022-10-25 DIAGNOSIS — H26492 Other secondary cataract, left eye: Secondary | ICD-10-CM | POA: Diagnosis not present

## 2022-10-25 DIAGNOSIS — H40053 Ocular hypertension, bilateral: Secondary | ICD-10-CM | POA: Diagnosis not present

## 2022-10-25 NOTE — Therapy (Signed)
OUTPATIENT PHYSICAL THERAPY TREATMENT     Patient Name: Datron Lopiano MRN: [id number] DOB:[birth year], 89 y.o., male Today's Date: 10/25/2022   END OF SESSION:  PT End of Session - 10/25/22 1316     Visit Number 18    Date for PT Re-Evaluation 11/05/22    Authorization Type Medicare & BCBS    Progress Note Due on Visit 20    PT Start Time 1316    PT Stop Time 1357    PT Time Calculation (min) 41 min    Activity Tolerance Patient tolerated treatment well    Behavior During Therapy WFL for tasks assessed/performed                       Past Medical History:  Diagnosis Date   Chronic diastolic heart failure (HCC)    Dysrhythmia    A-fib   FUO (fever of unknown origin) 07/27/2019   Heart murmur    since childhood, never has had any problems   Hypertension    Paroxysmal atrial fibrillation (HCC)    Pneumonia    PONV (postoperative nausea and vomiting)    a little nausea   Rheumatoid arthritis (HCC)    Tick bite 07/27/2019   Past Surgical History:  Procedure Laterality Date   CATARACT EXTRACTION Bilateral    COLONOSCOPY     EXCISION MASS HEAD Left 03/29/2022   Procedure: EXCISION OF EAR MOH'S DEFECT;  Surgeon: Scarlette Ar, MD;  Location: [facility] OR;  Service: ENT;  Laterality: Left;   NASAL FLAP ROTATION Left 03/29/2022   Procedure: POSSIBLE FLAP ROTATION; POSSIBLE EAR CARTILAGE GRAFT;  Surgeon: Scarlette Ar, MD;  Location: [facility] OR;  Service: ENT;  Laterality: Left;   ORIF ELBOW FRACTURE Right 09/18/2017   Procedure: OPEN REDUCTION INTERNAL FIXATION (ORIF) RIGHT ELBOW/OLECRANON FRACTURE;  Surgeon: Eldred Manges, MD;  Location: [facility] OR;  Service: Orthopedics;  Laterality: Right;   PROSTATE SURGERY     SKIN FULL THICKNESS GRAFT Left 03/29/2022   Procedure: ADJACENT TISSUE TRANSFER;  SKIN GRAFT;  Surgeon: Scarlette Ar, MD;  [facility];  Service: ENT;  Laterality: Left;   Patient Active Problem List   Diagnosis Date Noted   FUO (fever of unknown origin)  07/27/2019   Tick bite 07/27/2019   Pneumothorax on right 09/19/2017   Olecranon fracture 09/18/2017   Blepharitis of upper and lower eyelids of both eyes 04/19/2016   Pseudophakia of both eyes 04/19/2016   TB lung, latent 06/05/2011   Ocular hypertension, 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   Rheumatoid arthritis (HCC) 02/16/2004    PCP: [facility]   REFERRING PROVIDER:  Georgianne Fick, MD   REFERRING DIAG: R26.9 (ICD-10-CM) - Gait abnormality    THERAPY DIAG:  Repeated falls  Unsteadiness on feet  Other abnormalities of gait and mobility  Muscle weakness (generalized)  RATIONALE FOR EVALUATION AND TREATMENT: Rehabilitation  ONSET DATE: Progressive decline over past few years  NEXT MD VISIT: 02/20/23   SUBJECTIVE:  SUBJECTIVE STATEMENT: Pt reports he feels like he can pick his feet up better since he has started PT.   Pt accompanied by: significant other - wife - [name] (in waiting room)  PAIN: Are you having pain? No   PERTINENT HISTORY:  Paroxysmal A-fib, chronic DHF, heart murmur, generalized OA, RA, COPD/emphysema, HTN, HLD, CKD, R elbow/olecranon fracture 2019, RLS  PRECAUTIONS: Fall  WEIGHT BEARING RESTRICTIONS: No  FALLS:  Has patient fallen in last 6 months? Yes. Number of falls >6  LIVING ENVIRONMENT: Lives with: lives with their spouse Lives in: House/apartment Stairs: Yes: Internal: 1 steps; sunken room and External: 1 steps; ramp Has following equipment at home: Single point cane, Walker - 2 wheeled, Environmental consultant - 4 wheeled, Wheelchair (manual), Ramped entry, and transport chair  OCCUPATION: Retired  PLOF: Independent and Leisure: most sedentary  PATIENT GOALS: "To do better with my balance, my walking and getting up out of a  chair."   OBJECTIVE: (objective measures completed at initial evaluation unless otherwise dated)  DIAGNOSTIC FINDINGS:  03/13/22 - CT cervical spine s/p fall: IMPRESSION: 1. No evidence of acute fracture to the cervical spine. 2. Nonspecific straightening of the expected cervical lordosis. 3. Grade 1 anterolisthesis at C2-C3, C3-C4, C6-C7 and T1-T2. 4. Cervical spondylosis, as described.  03/13/22 Head CT s/p fall with trauma to the head and face: IMPRESSION:  No acute or traumatic finding. Age related volume loss and chronic small-vessel ischemic change of the white matter.  COGNITION: Overall cognitive status: Within functional limits for tasks assessed   SENSATION: B LE RLS  COORDINATION: Slowed reciprocal movements  POSTURE:  rounded shoulders, forward head, decreased lumbar lordosis, increased thoracic kyphosis, flexed trunk , and significant kyphoscoliosis  LOWER EXTREMITY MMT:  (tested in sitting on eval, 09/04/22 and 10/02/22)  MMT Right Eval Left Eval R 09/04/22 L 09/04/22 R 10/02/22 L 10/02/22  Hip flexion 4- 4- 4- 4- 4 4-  Hip extension 4 4- 4 4- 4 4-  Hip abduction 4+ 4 4+ 4+ 4+ 4+  Hip adduction 5 4+ 5 5 5 5   Hip internal rotation 4+ 4- 4+ 4+ 4+ 4+  Hip external rotation 4- 4- 4 4 4+ 4+  Knee flexion 4 4- 4+ 4+ 5 5  Knee extension 4+ 4 4+ 4+ 5 5  Ankle dorsiflexion 3+ 3+ 4- 4 4 4   Ankle plantarflexion        Ankle inversion        Ankle eversion        (Blank rows = not tested)  BED MOBILITY:  NT  TRANSFERS: Assistive device utilized: Environmental consultant - 4 wheeled  Sit to stand: SBA and CGA - CGA when performing without UE assist Stand to sit: SBA Chair to chair: SBA Floor:  NT  GAIT: Distance walked: 80 ft Assistive device utilized: Environmental consultant - 4 wheeled Level of assistance: Modified independence and SBA Gait pattern: step through pattern, decreased stride length, decreased hip/knee flexion- Right, decreased hip/knee flexion- Left, and trunk  flexed Comments:  RAMP: Level of Assistance:  NT Assistive device utilized: NT Ramp Comments:   CURB:  Level of Assistance:  NT Assistive device utilized:  NT Curb Comments:   STAIRS:  Level of Assistance: NT  Stair Negotiation Technique:   Number of Stairs:    Height of Stairs:   Comments:   FUNCTIONAL TESTS:  5 times sit to stand: 13.72 sec with B UE assist; able to complete single STS w/o UE assist but unable to maintain balance  Timed up and go (TUG): 25.22 sec with rollator - 08/14/22 10 meter walk test: 17.44 sec with rollator; Gait speed = 1.88 ft sec with rollator - 08/14/22 Berg Balance Scale: 33/56; < 36 high risk for falls (close to 100%) Dynamic Gait Index: 13/24, Scores of 19 or less are predictive of falls in older community living adults - 08/14/22  09/04/22 Berg: 40/56; 37-45 significant risk for fall (>80%)   09/07/22 TUG: 17 seconds with rollator  09/24/22: : 18.75 sec with rollator Gait speed: 1.75 ft /sec with rollator DGI: 14/24: Scores of 19 or less are predictive of falls in older community living adults  5xSTS: 19.25 sec w/o need for UE assist; 14.19 sec w/ B UE assist  10/25/22: Sharlene Motts: 46/56; 46-51 moderate risk for falls (>50%)  TUG: 17.16 sec with rollator : 9.65 sec with rollator Gait speed: 3.40 ft/sec with rollator  PATIENT SURVEYS:  ABC scale 520 / 1600 = 32.5 %   TODAY'S TREATMENT:   10/25/22 THERAPEUTIC EXERCISE: to improve flexibility, strength and mobility.  Demonstration, verbal and tactile cues throughout for technique.  Rec Bike - L2 x 6 min Seated hip ABD with looped GTB at knees 2 x 10 Seated hip ABD/ER clam with looped GTB at knees 2 x 10 Seated hip flexion march with looped GTB at knees 2 x 10  THERAPEUTIC ACTIVITIES: Berg: 46/56; 46-51 moderate risk for falls (>50%)  TUG: 17.16 sec with rollator : 9.65 sec with rollator Gait speed: 3.40 ft/sec with rollator   10/23/22 GAIT TRAINING: To normalize gait  pattern and improve safety with 4WW .  360 ft with 4WW/rollator - cues for posture and walker proximity as well as increased hip and knee flexion with heel strike on weight acceptance to improve foot clearance  THERAPEUTIC EXERCISE: to improve flexibility, strength and mobility.  Demonstration, verbal and tactile cues throughout for technique.  Seated RTB scap retraction + B shoulder row 10 x 3" - cues for upright posture Seated RTB scap retraction + B shoulder extension 10 x 3"  NEUROMUSCULAR RE-EDUCATION: To improve posture, balance, proprioception, coordination, and reduce fall risk. CGA of PT via gait belt.  Static stance on Airex pad x 30 sec Weight shifting (lateral and ant/post) on Airex pad 2 x 10 each Reaching for and tossing bean bags x 12 Alt toe clears to Airex pad x 20 Staggered stance with fwd foot on Airex pad x 30 sec with each foot fwd   10/19/22 THERAPEUTIC EXERCISE: to improve flexibility, strength and mobility.  Demonstration, verbal and tactile cues throughout for technique.  NuStep - L5 x 6 min  Standing RTB scap retraction + B shoulder row 10 x 3" - cues for upright posture Standing RTB scap retraction + B shoulder extension 10 x 3" Standing RTB pallof press 10 x 3" bil  GAIT TRAINING: To normalize gait pattern and improve safety with rollator . 300' with rollator indoors and outdoors, including elevator access and up/down curb - cues necessary for upright posture and rollator proximity as well as safe approach to curb and proper use of brakes while stepping up/down at curb  NEUROMUSCULAR RE-EDUCATION: To improve posture, balance, coordination, and reduce fall risk.  Standing PWR! Moves: Up x 10 - chair in front for safety Rock x 10  Twist x 10 - one hand on back of chair for balance, pulling back with YTB on side of rotation Step x 10 - one hand on back of chair for balance Alt toe clears  to 9" stool x 20, initially with B UE support on back of 2 chairs, weaning  to single UE support, then no UE support for last 10 reps   PATIENT EDUCATION:  Education details: progress with PT and ongoing PT POC  Person educated: Patient Education method: Explanation Education comprehension: verbalized understanding  HOME EXERCISE PROGRAM: Access Code: [email or web address] URL: [email or web address] Date: 09/24/2022 Prepared by: Glenetta Hew  Exercises - Seated Thoracic Lumbar Extension  - 1 x daily - 7 x weekly - 2 sets - 10 reps - 3 sec hold - Seated Scapular Retraction with External Rotation  - 1 x daily - 7 x weekly - 2 sets - 10 reps - 3 sec hold - Seated Hip Abduction with Resistance  - 1 x daily - 7 x weekly - 2 sets - 10 reps - 3-5 sec hold - Seated March with Resistance  - 1 x daily - 3 x weekly - 2 sets - 10 reps - 3 sec hold - Seated Ankle Dorsiflexion with Resistance  - 1 x daily - 7 x weekly - 2 sets - 10 reps - 3 sec hold - Long Sitting Upper Back Extension Press  - 1 x daily - 7 x weekly - 2 sets - 10 reps - 3 sec hold - Seated Shoulder Row with Anchored Resistance  - 1 x daily - 7 x weekly - 2 sets - 10 reps - 3-5 hold hold - Standing Hip Extension with Resistance at Ankles and Counter Support  - 1 x daily - 3 x weekly - 2 sets - 10 reps - 3 sec hold - Marching with Resistance  - 1 x daily - 3 x weekly - 2 sets - 10 reps - 3 sec hold   ASSESSMENT:  CLINICAL IMPRESSION: Karman "Ray" is demonstrating good progress with PT with significant gains noted on standardized balance testing with Berg (fall risk reduced to moderate fall risk) as well as improvement in gait speed - associated LTG's #6 and 7 now met.  Continued HEP review confirming proper exercise technique as well as continued appropriateness of GTB resistance for seated LE strengthening exercises.  Will continue to review and finalize HEP in the next few visits in preparation for transition to HEP at current POC while continuing to work on rollator safety and balance.  Ray will benefit  from continued skilled PT to address ongoing deficits to improve mobility and activity tolerance with decreased risk for falls.   OBJECTIVE IMPAIRMENTS: Abnormal gait, decreased activity tolerance, decreased balance, decreased coordination, decreased knowledge of condition, decreased knowledge of use of DME, decreased mobility, difficulty walking, decreased ROM, decreased strength, decreased safety awareness, impaired perceived functional ability, impaired flexibility, impaired sensation, improper body mechanics, postural dysfunction, and pain.   ACTIVITY LIMITATIONS: carrying, lifting, bending, standing, squatting, stairs, transfers, bed mobility, locomotion level, and caring for others  PARTICIPATION LIMITATIONS: meal prep, cleaning, laundry, driving, community activity, and yard work  PERSONAL FACTORS: Age, Fitness, Past/current experiences, Time since onset of injury/illness/exacerbation, and 3+ comorbidities: Paroxysmal A-fib, chronic DHF, heart murmur, generalized OA, RA, COPD/emphysema, HTN, HLD, CKD, R elbow/olecranon fracture 2019, RLS  are also affecting patient's functional outcome.   REHAB POTENTIAL: Good  CLINICAL DECISION MAKING: Evolving/moderate complexity  EVALUATION COMPLEXITY: Moderate   GOALS: Goals reviewed with patient? Yes  SHORT TERM GOALS: Target date: 09/05/2022  Patient will be independent with initial HEP to improve outcomes and carryover.  Baseline: TBD Goal status: MET  08/22/22    2.  Patient  will be educated on strategies to decrease risk of falls.  Baseline:  Goal status: MET  09/07/22  3.  Patient will demonstrate decreased TUG time to </= 20 sec to decrease risk for falls with transitional mobility. Baseline: 25.22 sec with rollator  Goal status: MET  09/07/22 - 17 sec with rollator  LONG TERM GOALS: Target date: 10/03/2022, extended to 11/05/2022  Patient will be independent with advanced/ongoing HEP to facilitate ability to maintain/progress functional  gains from skilled physical therapy services. Baseline: Goal status: IN PROGRESS  10/23/22 - HEP updated today  2.  Patient will be able to step up/down curb safely with LRAD for safety with community ambulation and increased ease of access to sunken room in home.  Baseline:  Goal status: IN PROGRESS  10/19/22 - cues necessary for safe approach and appropriate use of rollator brakes  3.  Patient will demonstrate improved functional LE strength as demonstrated by ability to independently achieve sit to stand without UE assist or loss of balance. Baseline: Requires B UE assist for majority of sit to stand; w/o UE assist, he requires increased momentum with LOB observed upon reaching standing Goal status: MET  09/24/22  4.  Patient will improve 5x STS time to </= 14.8 seconds w/o UE assist for improved efficiency and safety with transfers per age-matched norms. Baseline: 13.72 sec with B UE assist; able to complete single STS w/o UE assist but unable to maintain balance Goal status: IN PROGRESS  09/24/22 - 19.25 sec w/o need for UE assist (no LOB); 14.19 sec w/ B UE assist  5.  Patient will demonstrate gait speed of >/= 2.62 ft/sec (0.8 m/s) to be a safe community ambulator with decreased risk for recurrent falls.  Baseline: 1.88 ft/sec with rollator, 1.75 ft/sec with rollator (09/24/22) Goal status: MET  10/25/22 - 3.40 ft/sec with rollator  6.  Patient will improve Berg score to >/= 41/56 to improve safety and stability with ADLs in standing and reduce risk for falls. (MCID= 8 points)  Baseline: 33/56  (43/56 as of DC from PT in 2019); 40/56 (09/04/22) Goal status: MET  10/25/22 - Berg: 46/56  7.  Patient will demonstrate at least 19/24 on DGI to improve gait stability and reduce risk for falls. Baseline: 13/24 on 08/14/22  (20/24 as of DC from PT in 2019) Goal status: IN PROGRESS  09/24/22 - DGI: 14/24  8  Patient will report >/= 50% on ABC scale to demonstrate improved functional  ability. Baseline: 520 / 1600 = 32.5 % Goal status: IN PROGRESS   PLAN:  PT FREQUENCY: 1-2x/week, starting at 2x/wk, but reducing frequency to 1x/wk if too overwhelmed  PT DURATION: 6 weeks  PLANNED INTERVENTIONS: Therapeutic exercises, Therapeutic activity, Neuromuscular re-education, Balance training, Gait training, Patient/Family education, Self Care, Joint mobilization, Stair training, DME instructions, Dry Needling, Electrical stimulation, Cryotherapy, Moist heat, Taping, Ultrasound, Manual therapy, and Re-evaluation  PLAN FOR NEXT SESSION:  Review and update of HEP in prep for transition to HEP at end of current POC; review of curb negotiation (weather permitting); postural and core/LE strengthening working towards more standing exercises/activities; balance training, integrating components of [place] fall prevention program and PWR! Moves  Marry Guan, [place] 10/25/22 6:46 PM

## 2022-10-30 ENCOUNTER — Ambulatory Visit: Payer: Medicare Other | Attending: Internal Medicine

## 2022-10-30 DIAGNOSIS — R296 Repeated falls: Secondary | ICD-10-CM | POA: Insufficient documentation

## 2022-10-30 DIAGNOSIS — R2689 Other abnormalities of gait and mobility: Secondary | ICD-10-CM | POA: Insufficient documentation

## 2022-10-30 DIAGNOSIS — M6281 Muscle weakness (generalized): Secondary | ICD-10-CM | POA: Insufficient documentation

## 2022-10-30 DIAGNOSIS — R2681 Unsteadiness on feet: Secondary | ICD-10-CM | POA: Diagnosis not present

## 2022-10-30 NOTE — Therapy (Signed)
OUTPATIENT PHYSICAL THERAPY TREATMENT     Patient Name: Cory Gonzalez MRN: [id number] DOB:23-Dec-1929, 89 y.o., male Today's Date: 10/30/2022   END OF SESSION:  PT End of Session - 10/30/22 1357     Visit Number 19    Date for PT Re-Evaluation 11/05/22    Authorization Type Medicare & BCBS    Progress Note Due on Visit 20    PT Start Time 1315    PT Stop Time 1357    PT Time Calculation (min) 42 min    Activity Tolerance Patient tolerated treatment well    Behavior During Therapy WFL for tasks assessed/performed                        Past Medical History:  Diagnosis Date   Chronic diastolic heart failure (HCC)    Dysrhythmia    A-fib   FUO (fever of unknown origin) 07/27/2019   Heart murmur    since childhood, never has had any problems   Hypertension    Paroxysmal atrial fibrillation (HCC)    Pneumonia    PONV (postoperative nausea and vomiting)    a little nausea   Rheumatoid arthritis (HCC)    Tick bite 07/27/2019   Past Surgical History:  Procedure Laterality Date   CATARACT EXTRACTION Bilateral    COLONOSCOPY     EXCISION MASS HEAD Left 03/29/2022   Procedure: EXCISION OF EAR MOH'S DEFECT;  Surgeon: Scarlette Ar, MD;  Location: [facility] OR;  Service: ENT;  Laterality: Left;   NASAL FLAP ROTATION Left 03/29/2022   Procedure: POSSIBLE FLAP ROTATION; POSSIBLE EAR CARTILAGE GRAFT;  Surgeon: Scarlette Ar, MD;  Location: [facility] OR;  Service: ENT;  Laterality: Left;   ORIF ELBOW FRACTURE Right 09/18/2017   Procedure: OPEN REDUCTION INTERNAL FIXATION (ORIF) RIGHT ELBOW/OLECRANON FRACTURE;  Surgeon: Eldred Manges, MD;  Location: [facility] OR;  Service: Orthopedics;  Laterality: Right;   PROSTATE SURGERY     SKIN FULL THICKNESS GRAFT Left 03/29/2022   Procedure: ADJACENT TISSUE TRANSFER;  SKIN GRAFT;  Surgeon: Scarlette Ar, MD;  Location: [facility] OR;  Service: ENT;  Laterality: Left;   Patient Active Problem List   Diagnosis Date Noted   FUO (fever of unknown origin)  07/27/2019   Tick bite 07/27/2019   Pneumothorax on right 09/19/2017   Olecranon fracture 09/18/2017   Blepharitis of upper and lower eyelids of both eyes 04/19/2016   Pseudophakia of both eyes 04/19/2016   TB lung, latent 06/05/2011   Ocular hypertension, 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   Rheumatoid arthritis (HCC) 02/16/2004    PCP: [facility]   REFERRING PROVIDER:  Georgianne Fick, MD   REFERRING DIAG: R26.9 (ICD-10-CM) - Gait abnormality    THERAPY DIAG:  Repeated falls  Unsteadiness on feet  Other abnormalities of gait and mobility  Muscle weakness (generalized)  RATIONALE FOR EVALUATION AND TREATMENT: Rehabilitation  ONSET DATE: Progressive decline over past few years  NEXT MD VISIT: 02/20/23   SUBJECTIVE:  SUBJECTIVE STATEMENT: [place], he just slipped and fell backwards on his bottom and back with some soreness and bruising afterward.  Pt accompanied by: significant other - wife - Cory Gonzalez (in waiting room)  PAIN: Are you having pain? No   PERTINENT HISTORY:  Paroxysmal A-fib, chronic DHF, heart murmur, generalized OA, RA, COPD/emphysema, HTN, HLD, CKD, R elbow/olecranon fracture 2019, RLS  PRECAUTIONS: Fall  WEIGHT BEARING RESTRICTIONS: No  FALLS:  Has patient fallen in last 6 months? Yes. Number of falls >6  LIVING ENVIRONMENT: Lives with: lives with their spouse Lives in: House/apartment Stairs: Yes: Internal: 1 steps; sunken room and External: 1 steps; ramp Has following equipment at home: Single point cane, Walker - 2 wheeled, Environmental consultant - 4 wheeled, Wheelchair (manual), Ramped entry, and transport chair  OCCUPATION: Retired  PLOF: Independent and Leisure: most sedentary  PATIENT GOALS: "To  do better with my balance, my walking and getting up out of a chair."   OBJECTIVE: (objective measures completed at initial evaluation unless otherwise dated)  DIAGNOSTIC FINDINGS:  03/13/22 - CT cervical spine s/p fall: IMPRESSION: 1. No evidence of acute fracture to the cervical spine. 2. Nonspecific straightening of the expected cervical lordosis. 3. Grade 1 anterolisthesis at C2-C3, C3-C4, C6-C7 and T1-T2. 4. Cervical spondylosis, as described.  03/13/22 Head CT s/p fall with trauma to the head and face: IMPRESSION:  No acute or traumatic finding. Age related volume loss and chronic small-vessel ischemic change of the white matter.  COGNITION: Overall cognitive status: Within functional limits for tasks assessed   SENSATION: B LE RLS  COORDINATION: Slowed reciprocal movements  POSTURE:  rounded shoulders, forward head, decreased lumbar lordosis, increased thoracic kyphosis, flexed trunk , and significant kyphoscoliosis  LOWER EXTREMITY MMT:  (tested in sitting on eval, 09/04/22 and 10/02/22)  MMT Right Eval Left Eval R 09/04/22 L 09/04/22 R 10/02/22 L 10/02/22  Hip flexion 4- 4- 4- 4- 4 4-  Hip extension 4 4- 4 4- 4 4-  Hip abduction 4+ 4 4+ 4+ 4+ 4+  Hip adduction 5 4+ 5 5 5 5   Hip internal rotation 4+ 4- 4+ 4+ 4+ 4+  Hip external rotation 4- 4- 4 4 4+ 4+  Knee flexion 4 4- 4+ 4+ 5 5  Knee extension 4+ 4 4+ 4+ 5 5  Ankle dorsiflexion 3+ 3+ 4- 4 4 4   Ankle plantarflexion        Ankle inversion        Ankle eversion        (Blank rows = not tested)  BED MOBILITY:  NT  TRANSFERS: Assistive device utilized: Environmental consultant - 4 wheeled  Sit to stand: SBA and CGA - CGA when performing without UE assist Stand to sit: SBA Chair to chair: SBA Floor:  NT  GAIT: Distance walked: 80 ft Assistive device utilized: Environmental consultant - 4 wheeled Level of assistance: Modified independence and SBA Gait pattern: step through pattern, decreased stride length, decreased hip/knee flexion- Right, decreased  hip/knee flexion- Left, and trunk flexed Comments:  RAMP: Level of Assistance:  NT Assistive device utilized: NT Ramp Comments:   CURB:  Level of Assistance:  NT Assistive device utilized:  NT Curb Comments:   STAIRS:  Level of Assistance: NT  Stair Negotiation Technique:   Number of Stairs:    Height of Stairs:   Comments:   FUNCTIONAL TESTS:  5 times sit to stand: 13.72 sec with B UE assist; able to  complete single STS w/o UE assist but unable to maintain balance Timed up and go (TUG): 25.22 sec with rollator - 08/14/22 10 meter walk test: 17.44 sec with rollator; Gait speed = 1.88 ft sec with rollator - 08/14/22 Sharlene Motts Balance Scale: 33/56; < 36 high risk for falls (close to 100%) Dynamic Gait Index: 13/24, Scores of 19 or less are predictive of falls in older community living adults - 08/14/22  09/04/22 Berg: 40/56; 37-45 significant risk for fall (>80%)   09/07/22 TUG: 17 seconds with rollator  09/24/22: : 18.75 sec with rollator Gait speed: 1.75 ft /sec with rollator DGI: 14/24: Scores of 19 or less are predictive of falls in older community living adults  5xSTS: 19.25 sec w/o need for UE assist; 14.19 sec w/ B UE assist  10/25/22: Sharlene Motts: 46/56; 46-51 moderate risk for falls (>50%)  TUG: 17.16 sec with rollator : 9.65 sec with rollator Gait speed: 3.40 ft/sec with rollator  PATIENT SURVEYS:  ABC scale 520 / 1600 = 32.5 %   TODAY'S TREATMENT:  10/30/22 THERAPEUTIC EXERCISE: to improve flexibility, strength and mobility.  Demonstration, verbal and tactile cues throughout for technique.  Gait Training as a warm up: around clinic for 180 ft Seated clam with GTB 2x15 arms crossed Seated marching GTB 2x15 arms crossed Seated on dynadisk: ----Rows RTB x 10  ----Pallof press RTB x 10 bil Standing fwd step and return with 1/2 foam roll x 10 each LE  Standing lateral step and return with 1/2 foam roll x 10 each LE  Gait training for another  53ft  10/25/22 THERAPEUTIC EXERCISE: to improve flexibility, strength and mobility.  Demonstration, verbal and tactile cues throughout for technique.  Rec Bike - L2 x 6 min Seated hip ABD with looped GTB at knees 2 x 10 Seated hip ABD/ER clam with looped GTB at knees 2 x 10 Seated hip flexion march with looped GTB at knees 2 x 10  THERAPEUTIC ACTIVITIES: Berg: 46/56; 46-51 moderate risk for falls (>50%)  TUG: 17.16 sec with rollator : 9.65 sec with rollator Gait speed: 3.40 ft/sec with rollator   10/23/22 GAIT TRAINING: To normalize gait pattern and improve safety with 4WW .  360 ft with 4WW/rollator - cues for posture and walker proximity as well as increased hip and knee flexion with heel strike on weight acceptance to improve foot clearance  THERAPEUTIC EXERCISE: to improve flexibility, strength and mobility.  Demonstration, verbal and tactile cues throughout for technique.  Seated RTB scap retraction + B shoulder row 10 x 3" - cues for upright posture Seated RTB scap retraction + B shoulder extension 10 x 3"  NEUROMUSCULAR RE-EDUCATION: To improve posture, balance, proprioception, coordination, and reduce fall risk. CGA of PT via gait belt.  Static stance on Airex pad x 30 sec Weight shifting (lateral and ant/post) on Airex pad 2 x 10 each Reaching for and tossing bean bags x 12 Alt toe clears to Airex pad x 20 Staggered stance with fwd foot on Airex pad x 30 sec with each foot fwd   10/19/22 THERAPEUTIC EXERCISE: to improve flexibility, strength and mobility.  Demonstration, verbal and tactile cues throughout for technique.  NuStep - L5 x 6 min  Standing RTB scap retraction + B shoulder row 10 x 3" - cues for upright posture Standing RTB scap retraction + B shoulder extension 10 x 3" Standing RTB pallof press 10 x 3" bil  GAIT TRAINING: To normalize gait pattern and improve safety with rollator . 300'  with rollator indoors and outdoors, including elevator access and  up/down curb - cues necessary for upright posture and rollator proximity as well as safe approach to curb and proper use of brakes while stepping up/down at curb  NEUROMUSCULAR RE-EDUCATION: To improve posture, balance, coordination, and reduce fall risk.  Standing PWR! Moves: Up x 10 - chair in front for safety Rock x 10  Twist x 10 - one hand on back of chair for balance, pulling back with YTB on side of rotation Step x 10 - one hand on back of chair for balance Alt toe clears to 9" stool x 20, initially with B UE support on back of 2 chairs, weaning to single UE support, then no UE support for last 10 reps   PATIENT EDUCATION:  Education details: progress with PT and ongoing PT POC  Person educated: Patient Education method: Explanation Education comprehension: verbalized understanding  HOME EXERCISE PROGRAM: Access Code: [email or web address] URL: [email or web address] Date: 09/24/2022 Prepared by: Glenetta Hew  Exercises - Seated Thoracic Lumbar Extension  - 1 x daily - 7 x weekly - 2 sets - 10 reps - 3 sec hold - Seated Scapular Retraction with External Rotation  - 1 x daily - 7 x weekly - 2 sets - 10 reps - 3 sec hold - Seated Hip Abduction with Resistance  - 1 x daily - 7 x weekly - 2 sets - 10 reps - 3-5 sec hold - Seated March with Resistance  - 1 x daily - 3 x weekly - 2 sets - 10 reps - 3 sec hold - Seated Ankle Dorsiflexion with Resistance  - 1 x daily - 7 x weekly - 2 sets - 10 reps - 3 sec hold - Long Sitting Upper Back Extension Press  - 1 x daily - 7 x weekly - 2 sets - 10 reps - 3 sec hold - Seated Shoulder Row with Anchored Resistance  - 1 x daily - 7 x weekly - 2 sets - 10 reps - 3-5 hold hold - Standing Hip Extension with Resistance at Ankles and Counter Support  - 1 x daily - 3 x weekly - 2 sets - 10 reps - 3 sec hold - Marching with Resistance  - 1 x daily - 3 x weekly - 2 sets - 10 reps - 3 sec hold   ASSESSMENT:  CLINICAL IMPRESSION: Neeraj "Ray" has had  a fall over the weekend in the shower where he fell backwards, no injury noted other than soreness and bruising. He become more fatigued today requiring more seated breaks than usual. Still needs postural cues during gait training and exercises for upright posture. Demonstrated difficulty with stabilization seated on dynadisk, with compensation to lean backward and laterally.  Ray will benefit from continued skilled PT to address ongoing deficits to improve mobility and activity tolerance with decreased risk for falls.   OBJECTIVE IMPAIRMENTS: Abnormal gait, decreased activity tolerance, decreased balance, decreased coordination, decreased knowledge of condition, decreased knowledge of use of DME, decreased mobility, difficulty walking, decreased ROM, decreased strength, decreased safety awareness, impaired perceived functional ability, impaired flexibility, impaired sensation, improper body mechanics, postural dysfunction, and pain.   ACTIVITY LIMITATIONS: carrying, lifting, bending, standing, squatting, stairs, transfers, bed mobility, locomotion level, and caring for others  PARTICIPATION LIMITATIONS: meal prep, cleaning, laundry, driving, community activity, and yard work  PERSONAL FACTORS: Age, Fitness, Past/current experiences, Time since onset of injury/illness/exacerbation, and 3+ comorbidities: Paroxysmal A-fib, chronic DHF, heart murmur, generalized OA, RA, COPD/emphysema, HTN, HLD,  CKD, R elbow/olecranon fracture 2019, RLS  are also affecting patient's functional outcome.   REHAB POTENTIAL: Good  CLINICAL DECISION MAKING: Evolving/moderate complexity  EVALUATION COMPLEXITY: Moderate   GOALS: Goals reviewed with patient? Yes  SHORT TERM GOALS: Target date: 09/05/2022  Patient will be independent with initial HEP to improve outcomes and carryover.  Baseline: TBD Goal status: MET  08/22/22    2.  Patient will be educated on strategies to decrease risk of falls.  Baseline:  Goal  status: MET  09/07/22  3.  Patient will demonstrate decreased TUG time to </= 20 sec to decrease risk for falls with transitional mobility. Baseline: 25.22 sec with rollator  Goal status: MET  09/07/22 - 17 sec with rollator  LONG TERM GOALS: Target date: 10/03/2022, extended to 11/05/2022  Patient will be independent with advanced/ongoing HEP to facilitate ability to maintain/progress functional gains from skilled physical therapy services. Baseline: Goal status: IN PROGRESS  10/23/22 - HEP updated today  2.  Patient will be able to step up/down curb safely with LRAD for safety with community ambulation and increased ease of access to sunken room in home.  Baseline:  Goal status: IN PROGRESS  10/19/22 - cues necessary for safe approach and appropriate use of rollator brakes  3.  Patient will demonstrate improved functional LE strength as demonstrated by ability to independently achieve sit to stand without UE assist or loss of balance. Baseline: Requires B UE assist for majority of sit to stand; w/o UE assist, he requires increased momentum with LOB observed upon reaching standing Goal status: MET  09/24/22  4.  Patient will improve 5x STS time to </= 14.8 seconds w/o UE assist for improved efficiency and safety with transfers per age-matched norms. Baseline: 13.72 sec with B UE assist; able to complete single STS w/o UE assist but unable to maintain balance Goal status: IN PROGRESS  09/24/22 - 19.25 sec w/o need for UE assist (no LOB); 14.19 sec w/ B UE assist  5.  Patient will demonstrate gait speed of >/= 2.62 ft/sec (0.8 m/s) to be a safe community ambulator with decreased risk for recurrent falls.  Baseline: 1.88 ft/sec with rollator, 1.75 ft/sec with rollator (09/24/22) Goal status: MET  10/25/22 - 3.40 ft/sec with rollator  6.  Patient will improve Berg score to >/= 41/56 to improve safety and stability with ADLs in standing and reduce risk for falls. (MCID= 8 points)  Baseline: 33/56  (43/56  as of DC from PT in 2019); 40/56 (09/04/22) Goal status: MET  10/25/22 - Berg: 46/56  7.  Patient will demonstrate at least 19/24 on DGI to improve gait stability and reduce risk for falls. Baseline: 13/24 on 08/14/22  (20/24 as of DC from PT in 2019) Goal status: IN PROGRESS  09/24/22 - DGI: 14/24  8  Patient will report >/= 50% on ABC scale to demonstrate improved functional ability. Baseline: 520 / 1600 = 32.5 % Goal status: IN PROGRESS   PLAN:  PT FREQUENCY: 1-2x/week, starting at 2x/wk, but reducing frequency to 1x/wk if too overwhelmed  PT DURATION: 6 weeks  PLANNED INTERVENTIONS: Therapeutic exercises, Therapeutic activity, Neuromuscular re-education, Balance training, Gait training, Patient/Family education, Self Care, Joint mobilization, Stair training, DME instructions, Dry Needling, Electrical stimulation, Cryotherapy, Moist heat, Taping, Ultrasound, Manual therapy, and Re-evaluation  PLAN FOR NEXT SESSION:  Review and update of HEP in prep for transition to HEP at end of current POC; review of curb negotiation (weather permitting); postural and core/LE strengthening working  towards more standing exercises/activities; balance training, integrating components of [place] fall prevention program and PWR! Moves  Darleene Cleaver, PTA 10/30/22 2:07 PM

## 2022-11-01 ENCOUNTER — Ambulatory Visit: Payer: Medicare Other | Admitting: Physical Therapy

## 2022-11-01 ENCOUNTER — Encounter: Payer: Self-pay | Admitting: Physical Therapy

## 2022-11-01 DIAGNOSIS — M6281 Muscle weakness (generalized): Secondary | ICD-10-CM | POA: Diagnosis not present

## 2022-11-01 DIAGNOSIS — R2689 Other abnormalities of gait and mobility: Secondary | ICD-10-CM

## 2022-11-01 DIAGNOSIS — M0589 Other rheumatoid arthritis with rheumatoid factor of multiple sites: Secondary | ICD-10-CM | POA: Diagnosis not present

## 2022-11-01 DIAGNOSIS — R2681 Unsteadiness on feet: Secondary | ICD-10-CM | POA: Diagnosis not present

## 2022-11-01 DIAGNOSIS — R296 Repeated falls: Secondary | ICD-10-CM

## 2022-11-01 NOTE — Therapy (Signed)
OUTPATIENT PHYSICAL THERAPY TREATMENT   Progress Note  Reporting Period 09/24/2022 to 11/01/2022  See note below for Objective Data and Assessment of Progress/Goals.   Patient Name: Cory Gonzalez MRN: [id number] DOB:Sep 06, 1929, 89 y.o., male Today's Date: 11/01/2022   END OF SESSION:  PT End of Session - 11/01/22 1315     Visit Number 20    Date for PT Re-Evaluation 11/05/22    Authorization Type Medicare & BCBS    PT Start Time 1315    PT Stop Time 1400    PT Time Calculation (min) 45 min    Activity Tolerance Patient tolerated treatment well    Behavior During Therapy WFL for tasks assessed/performed                         Past Medical History:  Diagnosis Date   Chronic diastolic heart failure (HCC)    Dysrhythmia    A-fib   FUO (fever of unknown origin) 07/27/2019   Heart murmur    since childhood, never has had any problems   Hypertension    Paroxysmal atrial fibrillation (HCC)    Pneumonia    PONV (postoperative nausea and vomiting)    a little nausea   Rheumatoid arthritis (HCC)    Tick bite 07/27/2019   Past Surgical History:  Procedure Laterality Date   CATARACT EXTRACTION Bilateral    COLONOSCOPY     EXCISION MASS HEAD Left 03/29/2022   Procedure: EXCISION OF EAR MOH'S DEFECT;  Surgeon: Scarlette Ar, MD;  Location: [facility] OR;  Service: ENT;  Laterality: Left;   NASAL FLAP ROTATION Left 03/29/2022   Procedure: POSSIBLE FLAP ROTATION; POSSIBLE EAR CARTILAGE GRAFT;  Surgeon: Scarlette Ar, MD;  Location: [facility] OR;  Service: ENT;  Laterality: Left;   ORIF ELBOW FRACTURE Right 09/18/2017   Procedure: OPEN REDUCTION INTERNAL FIXATION (ORIF) RIGHT ELBOW/OLECRANON FRACTURE;  Surgeon: Eldred Manges, MD;  Location: [facility] OR;  Service: Orthopedics;  Laterality: Right;   PROSTATE SURGERY     SKIN FULL THICKNESS GRAFT Left 03/29/2022   Procedure: ADJACENT TISSUE TRANSFER;  SKIN GRAFT;  Surgeon: Scarlette Ar, MD;  Location: [facility] OR;  Service: ENT;  Laterality:  Left;   Patient Active Problem List   Diagnosis Date Noted   FUO (fever of unknown origin) 07/27/2019   Tick bite 07/27/2019   Pneumothorax on right 09/19/2017   Olecranon fracture 09/18/2017   Blepharitis of upper and lower eyelids of both eyes 04/19/2016   Pseudophakia of both eyes 04/19/2016   TB lung, latent 06/05/2011   Ocular hypertension, 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   Rheumatoid arthritis (HCC) 02/16/2004    PCP: [facility]   REFERRING PROVIDER:  Georgianne Fick, MD   REFERRING DIAG: R26.9 (ICD-10-CM) - Gait abnormality    THERAPY DIAG:  Repeated falls  Unsteadiness on feet  Other abnormalities of gait and mobility  Muscle weakness (generalized)  RATIONALE FOR EVALUATION AND TREATMENT: Rehabilitation  ONSET DATE: Progressive decline over past few years  NEXT MD VISIT: 02/20/23   SUBJECTIVE:  SUBJECTIVE STATEMENT: Pt reports he is still sore from his fall in the bathtub last Saturday but feels like he is getting better..  Pt accompanied by: significant other - wife - Cory Gonzalez (in waiting room)  PAIN: Are you having pain? No   PERTINENT HISTORY:  Paroxysmal A-fib, chronic DHF, heart murmur, generalized OA, RA, COPD/emphysema, HTN, HLD, CKD, R elbow/olecranon fracture 2019, RLS  PRECAUTIONS: Fall  WEIGHT BEARING RESTRICTIONS: No  FALLS:  Has patient fallen in last 6 months? Yes. Number of falls >6  LIVING ENVIRONMENT: Lives with: lives with their spouse Lives in: House/apartment Stairs: Yes: Internal: 1 steps; sunken room and External: 1 steps; ramp Has following equipment at home: Single point cane, Walker - 2 wheeled, Environmental consultant - 4 wheeled, Wheelchair (manual), Ramped entry, and transport chair  OCCUPATION: Retired  PLOF: Independent and  Leisure: most sedentary  PATIENT GOALS: "To do better with my balance, my walking and getting up out of a chair."   OBJECTIVE: (objective measures completed at initial evaluation unless otherwise dated)  DIAGNOSTIC FINDINGS:  03/13/22 - CT cervical spine s/p fall: IMPRESSION: 1. No evidence of acute fracture to the cervical spine. 2. Nonspecific straightening of the expected cervical lordosis. 3. Grade 1 anterolisthesis at C2-C3, C3-C4, C6-C7 and T1-T2. 4. Cervical spondylosis, as described.  03/13/22 Head CT s/p fall with trauma to the head and face: IMPRESSION:  No acute or traumatic finding. Age related volume loss and chronic small-vessel ischemic change of the white matter.  COGNITION: Overall cognitive status: Within functional limits for tasks assessed   SENSATION: B LE RLS  COORDINATION: Slowed reciprocal movements  POSTURE:  rounded shoulders, forward head, decreased lumbar lordosis, increased thoracic kyphosis, flexed trunk , and significant kyphoscoliosis  LOWER EXTREMITY MMT:  (tested in sitting on eval, 09/04/22 and 10/02/22)  MMT Right Eval Left Eval R 09/04/22 L 09/04/22 R 10/02/22 L 10/02/22  Hip flexion 4- 4- 4- 4- 4 4-  Hip extension 4 4- 4 4- 4 4-  Hip abduction 4+ 4 4+ 4+ 4+ 4+  Hip adduction 5 4+ 5 5 5 5   Hip internal rotation 4+ 4- 4+ 4+ 4+ 4+  Hip external rotation 4- 4- 4 4 4+ 4+  Knee flexion 4 4- 4+ 4+ 5 5  Knee extension 4+ 4 4+ 4+ 5 5  Ankle dorsiflexion 3+ 3+ 4- 4 4 4   Ankle plantarflexion        Ankle inversion        Ankle eversion        (Blank rows = not tested)  BED MOBILITY:  NT  TRANSFERS: Assistive device utilized: Environmental consultant - 4 wheeled  Sit to stand: SBA and CGA - CGA when performing without UE assist Stand to sit: SBA Chair to chair: SBA Floor:  NT  GAIT: Distance walked: 80 ft Assistive device utilized: Environmental consultant - 4 wheeled Level of assistance: Modified independence and SBA Gait pattern: step through pattern, decreased stride length,  decreased hip/knee flexion- Right, decreased hip/knee flexion- Left, and trunk flexed Comments:  RAMP: Level of Assistance:  NT Assistive device utilized: NT Ramp Comments:   CURB:  Level of Assistance:  NT Assistive device utilized:  NT Curb Comments:   STAIRS:  Level of Assistance: NT  Stair Negotiation Technique:   Number of Stairs:    Height of Stairs:   Comments:   FUNCTIONAL TESTS:  5 times sit to stand: 13.72 sec with B UE assist; able to complete single STS w/o UE assist but unable  to maintain balance Timed up and go (TUG): 25.22 sec with rollator - 08/14/22 10 meter walk test: 17.44 sec with rollator; Gait speed = 1.88 ft sec with rollator - 08/14/22 Sharlene Motts Balance Scale: 33/56; < 36 high risk for falls (close to 100%) Dynamic Gait Index: 13/24, Scores of 19 or less are predictive of falls in older community living adults - 08/14/22  09/04/22 Berg: 40/56; 37-45 significant risk for fall (>80%)   09/07/22 TUG: 17 seconds with rollator  09/24/22: : 18.75 sec with rollator Gait speed: 1.75 ft /sec with rollator DGI: 14/24: Scores of 19 or less are predictive of falls in older community living adults  5xSTS: 19.25 sec w/o need for UE assist; 14.19 sec w/ B UE assist  10/25/22: Sharlene Motts: 46/56; 46-51 moderate risk for falls (>50%)  TUG: 17.16 sec with rollator : 9.65 sec with rollator Gait speed: 3.40 ft/sec with rollator  11/01/22: 5xSTS = 20.88 sec w/o UE assist; 17.81 sec with B UE assist DGI = 11/24  PATIENT SURVEYS:  ABC scale 520 / 1600 = 32.5 %   TODAY'S TREATMENT:   11/01/22 THERAPEUTIC EXERCISE: to improve flexibility, strength and mobility.  Demonstration, verbal and tactile cues throughout for technique.  NuStep - L5 x 6 min Seated hip ABD with looped GTB at knees 2 x 10 Seated hip ABD/ER clam with looped GTB at knees 2 x 10 Seated hip flexion march with looped GTB at knees 2 x 10 Standing hip extension with looped GTB at ankles x 10 Standing hip  flexion march with looped GTB at ankles x 10  THERAPEUTIC ACTIVITIES: 5xSTS = 20.88 sec w/o UE assist; 17.81 sec with B UE assist DGI = 11/24   10/30/22 THERAPEUTIC EXERCISE: to improve flexibility, strength and mobility.  Demonstration, verbal and tactile cues throughout for technique.  Gait Training as a warm up: around clinic for 180 ft Seated clam with GTB 2x15 arms crossed Seated marching GTB 2x15 arms crossed Seated on dynadisk: ----Rows RTB x 10  ----Pallof press RTB x 10 bil Standing fwd step and return with 1/2 foam roll x 10 each LE  Standing lateral step and return with 1/2 foam roll x 10 each LE  Gait training for another 12ft   10/25/22 THERAPEUTIC EXERCISE: to improve flexibility, strength and mobility.  Demonstration, verbal and tactile cues throughout for technique.  Rec Bike - L2 x 6 min Seated hip ABD with looped GTB at knees 2 x 10 Seated hip ABD/ER clam with looped GTB at knees 2 x 10 Seated hip flexion march with looped GTB at knees 2 x 10  THERAPEUTIC ACTIVITIES: Berg: 46/56; 46-51 moderate risk for falls (>50%)  TUG: 17.16 sec with rollator : 9.65 sec with rollator Gait speed: 3.40 ft/sec with rollator   PATIENT EDUCATION:  Education details: progress with PT and ongoing PT POC  Person educated: Patient Education method: Explanation Education comprehension: verbalized understanding  HOME EXERCISE PROGRAM: Access Code: [email or web address] URL: [email or web address] Date: 09/24/2022 Prepared by: Glenetta Hew  Exercises - Seated Thoracic Lumbar Extension  - 1 x daily - 7 x weekly - 2 sets - 10 reps - 3 sec hold - Seated Scapular Retraction with External Rotation  - 1 x daily - 7 x weekly - 2 sets - 10 reps - 3 sec hold - Seated Hip Abduction with Resistance  - 1 x daily - 7 x weekly - 2 sets - 10 reps - 3-5 sec hold - Seated March with Resistance  - 1  x daily - 3 x weekly - 2 sets - 10 reps - 3 sec hold - Seated Ankle Dorsiflexion with  Resistance  - 1 x daily - 7 x weekly - 2 sets - 10 reps - 3 sec hold - Long Sitting Upper Back Extension Press  - 1 x daily - 7 x weekly - 2 sets - 10 reps - 3 sec hold - Seated Shoulder Row with Anchored Resistance  - 1 x daily - 7 x weekly - 2 sets - 10 reps - 3-5 hold hold - Standing Hip Extension with Resistance at Ankles and Counter Support  - 1 x daily - 3 x weekly - 2 sets - 10 reps - 3 sec hold - Marching with Resistance  - 1 x daily - 3 x weekly - 2 sets - 10 reps - 3 sec hold   ASSESSMENT:  CLINICAL IMPRESSION: Cory "Ray" continues to demonstrate the aftereffects of his recent fall, with slower mobility during transitional motions due to soreness and decreased overall energy/activity tolerance. 5xSTS times increased both with and without UE assist, and DGI score decreased by 3 points from most recent testing.  Progress with PT goals demonstrating setback related to fall.  Majority of HEP reviewed with patient reporting good comfort with and understanding of exercises, but is undecided if he is ready to proceed with transition to HEP next visit as initially planned given increased limitations from recent fall.  Will plan to complete formal assessment of goals next week and pending status of recovery from fall, determine if patient still ready to transition to HEP vs will need recert for continued skilled PT to address ongoing deficits to improve mobility and activity tolerance with decreased risk for falls.   OBJECTIVE IMPAIRMENTS: Abnormal gait, decreased activity tolerance, decreased balance, decreased coordination, decreased knowledge of condition, decreased knowledge of use of DME, decreased mobility, difficulty walking, decreased ROM, decreased strength, decreased safety awareness, impaired perceived functional ability, impaired flexibility, impaired sensation, improper body mechanics, postural dysfunction, and pain.   ACTIVITY LIMITATIONS: carrying, lifting, bending, standing,  squatting, stairs, transfers, bed mobility, locomotion level, and caring for others  PARTICIPATION LIMITATIONS: meal prep, cleaning, laundry, driving, community activity, and yard work  PERSONAL FACTORS: Age, Fitness, Past/current experiences, Time since onset of injury/illness/exacerbation, and 3+ comorbidities: Paroxysmal A-fib, chronic DHF, heart murmur, generalized OA, RA, COPD/emphysema, HTN, HLD, CKD, R elbow/olecranon fracture 2019, RLS  are also affecting patient's functional outcome.   REHAB POTENTIAL: Good  CLINICAL DECISION MAKING: Evolving/moderate complexity  EVALUATION COMPLEXITY: Moderate   GOALS: Goals reviewed with patient? Yes  SHORT TERM GOALS: Target date: 09/05/2022  Patient will be independent with initial HEP to improve outcomes and carryover.  Baseline: TBD Goal status: MET  08/22/22    2.  Patient will be educated on strategies to decrease risk of falls.  Baseline:  Goal status: MET  09/07/22  3.  Patient will demonstrate decreased TUG time to </= 20 sec to decrease risk for falls with transitional mobility. Baseline: 25.22 sec with rollator  Goal status: MET  09/07/22 - 17 sec with rollator  LONG TERM GOALS: Target date: 10/03/2022, extended to 11/05/2022  Patient will be independent with advanced/ongoing HEP to facilitate ability to maintain/progress functional gains from skilled physical therapy services. Baseline: Goal status: IN PROGRESS  11/01/22 - met for current HEP   2.  Patient will be able to step up/down curb safely with LRAD for safety with community ambulation and increased ease of access to sunken  room in home.  Baseline:  Goal status: IN PROGRESS  10/19/22 - cues necessary for safe approach and appropriate use of rollator brakes  3.  Patient will demonstrate improved functional LE strength as demonstrated by ability to independently achieve sit to stand without UE assist or loss of balance. Baseline: Requires B UE assist for majority of sit to  stand; w/o UE assist, he requires increased momentum with LOB observed upon reaching standing Goal status: MET  09/24/22  4.  Patient will improve 5x STS time to </= 14.8 seconds w/o UE assist for improved efficiency and safety with transfers per age-matched norms. Baseline: 13.72 sec with B UE assist; able to complete single STS w/o UE assist but unable to maintain balance; 09/24/22 - 19.25 sec w/o need for UE assist (no LOB); 14.19 sec w/ B UE assist  Goal status: IN PROGRESS  11/01/22 - 5xSTS = 20.88 sec w/o UE assist; 17.81 sec with B UE assist (sore from recent fall)  5.  Patient will demonstrate gait speed of >/= 2.62 ft/sec (0.8 m/s) to be a safe community ambulator with decreased risk for recurrent falls.  Baseline: 1.88 ft/sec with rollator, 1.75 ft/sec with rollator (09/24/22) Goal status: MET  10/25/22 - 3.40 ft/sec with rollator  6.  Patient will improve Berg score to >/= 41/56 to improve safety and stability with ADLs in standing and reduce risk for falls. (MCID= 8 points)  Baseline: 33/56  (43/56 as of DC from PT in 2019); 40/56 (09/04/22) Goal status: MET  10/25/22 - Berg: 46/56  7.  Patient will demonstrate at least 19/24 on DGI to improve gait stability and reduce risk for falls. Baseline: 13/24 on 08/14/22  (20/24 as of DC from PT in 2019); 09/24/22 - DGI: 14/24 Goal status: IN PROGRESS  11/01/22 - 11/24  8  Patient will report >/= 50% on ABC scale to demonstrate improved functional ability. Baseline: 520 / 1600 = 32.5 % Goal status: IN PROGRESS   PLAN:  PT FREQUENCY: 1-2x/week, starting at 2x/wk, but reducing frequency to 1x/wk if too overwhelmed  PT DURATION: 6 weeks  PLANNED INTERVENTIONS: Therapeutic exercises, Therapeutic activity, Neuromuscular re-education, Balance training, Gait training, Patient/Family education, Self Care, Joint mobilization, Stair training, DME instructions, Dry Needling, Electrical stimulation, Cryotherapy, Moist heat, Taping, Ultrasound, Manual  therapy, and Re-evaluation  PLAN FOR NEXT SESSION: Goal assessment to determine his for transition to HEP as planned vs need for recert for continued skilled PT including review of curb negotiation (weather permitting); postural and core/LE strengthening working towards more standing exercises/activities; balance training, integrating components of [place] fall prevention program and [facility]! Moves  [place], [place] 11/01/22 5:04 PM

## 2022-11-05 ENCOUNTER — Encounter: Payer: Self-pay | Admitting: Physical Therapy

## 2022-11-05 ENCOUNTER — Ambulatory Visit: Payer: Medicare Other | Admitting: Physical Therapy

## 2022-11-05 DIAGNOSIS — R296 Repeated falls: Secondary | ICD-10-CM | POA: Diagnosis not present

## 2022-11-05 DIAGNOSIS — R2681 Unsteadiness on feet: Secondary | ICD-10-CM

## 2022-11-05 DIAGNOSIS — M6281 Muscle weakness (generalized): Secondary | ICD-10-CM | POA: Diagnosis not present

## 2022-11-05 DIAGNOSIS — R2689 Other abnormalities of gait and mobility: Secondary | ICD-10-CM

## 2022-11-05 NOTE — Therapy (Signed)
OUTPATIENT PHYSICAL THERAPY TREATMENT / DISCHARGE SUMMARY  Progress Note  Reporting Period 09/24/2022 to 11/05/2022  See note below for Objective Data and Assessment of Progress/Goals.   Patient Name: Cory Gonzalez MRN: [id number] DOB:[birth year], 89 y.o., male Today's Date: 11/05/2022   END OF SESSION:  PT End of Session - 11/05/22 1315     Visit Number 21    Date for PT Re-Evaluation 11/05/22    Authorization Type Medicare & BCBS    PT Start Time 1315    PT Stop Time 1354    PT Time Calculation (min) 39 min    Activity Tolerance Patient tolerated treatment well    Behavior During Therapy WFL for tasks assessed/performed                          Past Medical History:  Diagnosis Date   Chronic diastolic heart failure (HCC)    Dysrhythmia    A-fib   FUO (fever of unknown origin) 07/27/2019   Heart murmur    since childhood, never has had any problems   Hypertension    Paroxysmal atrial fibrillation (HCC)    Pneumonia    PONV (postoperative nausea and vomiting)    a little nausea   Rheumatoid arthritis (HCC)    Tick bite 07/27/2019   Past Surgical History:  Procedure Laterality Date   CATARACT EXTRACTION Bilateral    COLONOSCOPY     EXCISION MASS HEAD Left 03/29/2022   Procedure: EXCISION OF EAR MOH'S DEFECT;  Surgeon: Scarlette Ar, MD;  Location: [facility] OR;  Service: ENT;  Laterality: Left;   NASAL FLAP ROTATION Left 03/29/2022   Procedure: POSSIBLE FLAP ROTATION; POSSIBLE EAR CARTILAGE GRAFT;  Surgeon: Scarlette Ar, MD;  Location: [facility] OR;  Service: ENT;  Laterality: Left;   ORIF ELBOW FRACTURE Right 09/18/2017   Procedure: OPEN REDUCTION INTERNAL FIXATION (ORIF) RIGHT ELBOW/OLECRANON FRACTURE;  Surgeon: Eldred Manges, MD;  Location: [facility] OR;  Service: Orthopedics;  Laterality: Right;   PROSTATE SURGERY     SKIN FULL THICKNESS GRAFT Left 03/29/2022   Procedure: ADJACENT TISSUE TRANSFER;  SKIN GRAFT;  Surgeon: Scarlette Ar, MD;  Location: [facility] OR;  Service:  ENT;  Laterality: Left;   Patient Active Problem List   Diagnosis Date Noted   FUO (fever of unknown origin) 07/27/2019   Tick bite 07/27/2019   Pneumothorax on right 09/19/2017   Olecranon fracture 09/18/2017   Blepharitis of upper and lower eyelids of both eyes 04/19/2016   Pseudophakia of both eyes 04/19/2016   TB lung, latent 06/05/2011   Ocular hypertension, 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   Rheumatoid arthritis (HCC) 02/16/2004    PCP: [facility]   REFERRING PROVIDER:  Georgianne Fick, MD   REFERRING DIAG: R26.9 (ICD-10-CM) - Gait abnormality    THERAPY DIAG:  Repeated falls  Unsteadiness on feet  Other abnormalities of gait and mobility  Muscle weakness (generalized)  RATIONALE FOR EVALUATION AND TREATMENT: Rehabilitation  ONSET DATE: Progressive decline over past few years  NEXT MD VISIT: 02/20/23   SUBJECTIVE:  SUBJECTIVE STATEMENT: Pt reports he has recovered from his fall and denies any pain today. He would like to proceed with D/C from PT as planned and feels that he will do well with his HEP.  Pt accompanied by: significant other - wife - [name] (in waiting room)  PAIN: Are you having pain? No   PERTINENT HISTORY:  Paroxysmal A-fib, chronic DHF, heart murmur, generalized OA, RA, COPD/emphysema, HTN, HLD, CKD, R elbow/olecranon fracture 2019, RLS  PRECAUTIONS: Fall  WEIGHT BEARING RESTRICTIONS: No  FALLS:  Has patient fallen in last 6 months? Yes. Number of falls >6  LIVING ENVIRONMENT: Lives with: lives with their spouse Lives in: House/apartment Stairs: Yes: Internal: 1 steps; sunken room and External: 1 steps; ramp Has following equipment at home: Single point cane, Walker - 2 wheeled, Environmental consultant - 4 wheeled, Wheelchair (manual), Ramped  entry, and transport chair  OCCUPATION: Retired  PLOF: Independent and Leisure: most sedentary  PATIENT GOALS: "To do better with my balance, my walking and getting up out of a chair."   OBJECTIVE: (objective measures completed at initial evaluation unless otherwise dated)  DIAGNOSTIC FINDINGS:  03/13/22 - CT cervical spine s/p fall: IMPRESSION: 1. No evidence of acute fracture to the cervical spine. 2. Nonspecific straightening of the expected cervical lordosis. 3. Grade 1 anterolisthesis at C2-C3, C3-C4, C6-C7 and T1-T2. 4. Cervical spondylosis, as described.  03/13/22 Head CT s/p fall with trauma to the head and face: IMPRESSION:  No acute or traumatic finding. Age related volume loss and chronic small-vessel ischemic change of the white matter.  COGNITION: Overall cognitive status: Within functional limits for tasks assessed   SENSATION: B LE RLS  COORDINATION: Slowed reciprocal movements  POSTURE:  rounded shoulders, forward head, decreased lumbar lordosis, increased thoracic kyphosis, flexed trunk , and significant kyphoscoliosis  LOWER EXTREMITY MMT:  (tested in sitting on eval, 09/04/22 and 10/02/22)  MMT Right Eval Left Eval R 09/04/22 L 09/04/22 R 10/02/22 L 10/02/22 R 11/05/22 L 11/05/22  Hip flexion 4- 4- 4- 4- 4 4- 4 4  Hip extension 4 4- 4 4- 4 4- 4+ 4+  Hip abduction 4+ 4 4+ 4+ 4+ 4+ 5 5  Hip adduction 5 4+ 5 5 5 5 5 5   Hip internal rotation 4+ 4- 4+ 4+ 4+ 4+ 4+ 4+  Hip external rotation 4- 4- 4 4 4+ 4+ 4+ 4+  Knee flexion 4 4- 4+ 4+ 5 5 5 5   Knee extension 4+ 4 4+ 4+ 5 5 5 5   Ankle dorsiflexion 3+ 3+ 4- 4 4 4 4 4   Ankle plantarflexion          Ankle inversion          Ankle eversion          (Blank rows = not tested)  BED MOBILITY:  NT  TRANSFERS: Assistive device utilized: Environmental consultant - 4 wheeled  Sit to stand: SBA and CGA - CGA when performing without UE assist Stand to sit: SBA Chair to chair: SBA Floor:  NT  GAIT: Distance walked: 80 ft Assistive device  utilized: Environmental consultant - 4 wheeled Level of assistance: Modified independence and SBA Gait pattern: step through pattern, decreased stride length, decreased hip/knee flexion- Right, decreased hip/knee flexion- Left, and trunk flexed Comments:  RAMP: Level of Assistance:  NT Assistive device utilized: NT Ramp Comments:   CURB:  Level of Assistance:  NT Assistive device utilized:  NT Curb Comments:   STAIRS:  Level of Assistance: NT  Psychologist, counselling  Technique:   Number of Stairs:    Height of Stairs:   Comments:   FUNCTIONAL TESTS:  5 times sit to stand: 13.72 sec with B UE assist; able to complete single STS w/o UE assist but unable to maintain balance Timed up and go (TUG): 25.22 sec with rollator - 08/14/22 10 meter walk test: 17.44 sec with rollator; Gait speed = 1.88 ft sec with rollator - 08/14/22 Berg Balance Scale: 33/56; < 36 high risk for falls (close to 100%) Dynamic Gait Index: 13/24, Scores of 19 or less are predictive of falls in older community living adults - 08/14/22  09/04/22 Berg: 40/56; 37-45 significant risk for fall (>80%)   09/07/22 TUG: 17 seconds with rollator  09/24/22: : 18.75 sec with rollator Gait speed: 1.75 ft /sec with rollator DGI: 14/24: Scores of 19 or less are predictive of falls in older community living adults  5xSTS: 19.25 sec w/o need for UE assist; 14.19 sec w/ B UE assist  10/25/22: Sharlene Motts: 46/56; 46-51 moderate risk for falls (>50%)  TUG: 17.16 sec with rollator : 9.65 sec with rollator Gait speed: 3.40 ft/sec with rollator  11/01/22: 5xSTS = 20.88 sec w/o UE assist; 17.81 sec with B UE assist DGI = 11/24  11/05/22: 5xSTS = 14.75 sec w/o UE assist; 12.60 sec with B UE assist DGI = 16/24  PATIENT SURVEYS:  ABC scale 520 / 1600 = 32.5 %    11/05/22 - 450 / 1600 = 28.1 %  TODAY'S TREATMENT:   11/05/22 THERAPEUTIC ACTIVITIES: ABC scale: 450 / 1600 = 28.1 % LE MMT 5xSTS = 14.75 sec w/o UE assist; 12.60 sec with B UE  assist DGI = 16/24 Curb negotiation with rollator/4WW Goal assessment   11/01/22 THERAPEUTIC EXERCISE: to improve flexibility, strength and mobility.  Demonstration, verbal and tactile cues throughout for technique.  NuStep - L5 x 6 min Seated hip ABD with looped GTB at knees 2 x 10 Seated hip ABD/ER clam with looped GTB at knees 2 x 10 Seated hip flexion march with looped GTB at knees 2 x 10 Standing hip extension with looped GTB at ankles x 10 Standing hip flexion march with looped GTB at ankles x 10  THERAPEUTIC ACTIVITIES: 5xSTS = 20.88 sec w/o UE assist; 17.81 sec with B UE assist DGI = 11/24   10/30/22 THERAPEUTIC EXERCISE: to improve flexibility, strength and mobility.  Demonstration, verbal and tactile cues throughout for technique.  Gait Training as a warm up: around clinic for 180 ft Seated clam with GTB 2x15 arms crossed Seated marching GTB 2x15 arms crossed Seated on dynadisk: ----Rows RTB x 10  ----Pallof press RTB x 10 bil Standing fwd step and return with 1/2 foam roll x 10 each LE  Standing lateral step and return with 1/2 foam roll x 10 each LE  Gait training for another 52ft   10/25/22 THERAPEUTIC EXERCISE: to improve flexibility, strength and mobility.  Demonstration, verbal and tactile cues throughout for technique.  Rec Bike - L2 x 6 min Seated hip ABD with looped GTB at knees 2 x 10 Seated hip ABD/ER clam with looped GTB at knees 2 x 10 Seated hip flexion march with looped GTB at knees 2 x 10  THERAPEUTIC ACTIVITIES: Berg: 46/56; 46-51 moderate risk for falls (>50%)  TUG: 17.16 sec with rollator : 9.65 sec with rollator Gait speed: 3.40 ft/sec with rollator   PATIENT EDUCATION:  Education details: progress with PT and ongoing PT POC  Person educated: Patient Education  method: Explanation Education comprehension: verbalized understanding  HOME EXERCISE PROGRAM: Access Code: [email or web address] URL: [email or web address] Date:  09/24/2022 Prepared by: Glenetta Hew  Exercises - Seated Thoracic Lumbar Extension  - 1 x daily - 7 x weekly - 2 sets - 10 reps - 3 sec hold - Seated Scapular Retraction with External Rotation  - 1 x daily - 7 x weekly - 2 sets - 10 reps - 3 sec hold - Seated Hip Abduction with Resistance  - 1 x daily - 7 x weekly - 2 sets - 10 reps - 3-5 sec hold - Seated March with Resistance  - 1 x daily - 3 x weekly - 2 sets - 10 reps - 3 sec hold - Seated Ankle Dorsiflexion with Resistance  - 1 x daily - 7 x weekly - 2 sets - 10 reps - 3 sec hold - Long Sitting Upper Back Extension Press  - 1 x daily - 7 x weekly - 2 sets - 10 reps - 3 sec hold - Seated Shoulder Row with Anchored Resistance  - 1 x daily - 7 x weekly - 2 sets - 10 reps - 3-5 hold hold - Standing Hip Extension with Resistance at Ankles and Counter Support  - 1 x daily - 3 x weekly - 2 sets - 10 reps - 3 sec hold - Marching with Resistance  - 1 x daily - 3 x weekly - 2 sets - 10 reps - 3 sec hold   ASSESSMENT:  CLINICAL IMPRESSION: Christiaan "Ray" reports he feels like he has fully recovered from his fall and reassessment of testing from last week revealing significant improvement. Majority of goals now met with exceptions of cueing necessary for safety with curb negotiation leaving LTG #2 only partially met, as well as LTG's #7 and 8 not met. Ray feels like he will manage well with his HEP from this point on and would like to proceed with transition to the HEP at this as originally planned.   OBJECTIVE IMPAIRMENTS: Abnormal gait, decreased activity tolerance, decreased balance, decreased coordination, decreased knowledge of condition, decreased knowledge of use of DME, decreased mobility, difficulty walking, decreased ROM, decreased strength, decreased safety awareness, impaired perceived functional ability, impaired flexibility, impaired sensation, improper body mechanics, postural dysfunction, and pain.   ACTIVITY LIMITATIONS: carrying,  lifting, bending, standing, squatting, stairs, transfers, bed mobility, locomotion level, and caring for others  PARTICIPATION LIMITATIONS: meal prep, cleaning, laundry, driving, community activity, and yard work  PERSONAL FACTORS: Age, Fitness, Past/current experiences, Time since onset of injury/illness/exacerbation, and 3+ comorbidities: Paroxysmal A-fib, chronic DHF, heart murmur, generalized OA, RA, COPD/emphysema, HTN, HLD, CKD, R elbow/olecranon fracture 2019, RLS  are also affecting patient's functional outcome.   REHAB POTENTIAL: Good  CLINICAL DECISION MAKING: Evolving/moderate complexity  EVALUATION COMPLEXITY: Moderate   GOALS: Goals reviewed with patient? Yes  SHORT TERM GOALS: Target date: 09/05/2022  Patient will be independent with initial HEP to improve outcomes and carryover.  Baseline: TBD Goal status: MET  08/22/22    2.  Patient will be educated on strategies to decrease risk of falls.  Baseline:  Goal status: MET  09/07/22  3.  Patient will demonstrate decreased TUG time to </= 20 sec to decrease risk for falls with transitional mobility. Baseline: 25.22 sec with rollator  Goal status: MET  09/07/22 - 17 sec with rollator  LONG TERM GOALS: Target date: 10/03/2022, extended to 11/05/2022  Patient will be independent with advanced/ongoing HEP to facilitate ability to maintain/progress functional gains  from skilled physical therapy services. Baseline: Goal status: MET  11/05/22   2.  Patient will be able to step up/down curb safely with LRAD for safety with community ambulation and increased ease of access to sunken room in home.  Baseline:  Goal status: PARTIALLY MET  11/05/22 - pt able to ascend/descend curb w/o assistance but intermittent cues necessary for safe approach and appropriate use of rollator brakes  3.  Patient will demonstrate improved functional LE strength as demonstrated by ability to independently achieve sit to stand without UE assist or loss of  balance. Baseline: Requires B UE assist for majority of sit to stand; w/o UE assist, he requires increased momentum with LOB observed upon reaching standing Goal status: MET  09/24/22  4.  Patient will improve 5x STS time to </= 14.8 seconds w/o UE assist for improved efficiency and safety with transfers per age-matched norms. Baseline: 13.72 sec with B UE assist; able to complete single STS w/o UE assist but unable to maintain balance; 09/24/22 - 19.25 sec w/o need for UE assist (no LOB); 14.19 sec w/ B UE assist; 11/01/22 - 5xSTS = 20.88 sec w/o UE assist; 17.81 sec with B UE assist (sore from recent fall) Goal status: MET 11/05/22 - 14.75 sec w/o UE assist; 12.60 sec with B UE assist  5.  Patient will demonstrate gait speed of >/= 2.62 ft/sec (0.8 m/s) to be a safe community ambulator with decreased risk for recurrent falls.  Baseline: 1.88 ft/sec with rollator, 1.75 ft/sec with rollator (09/24/22) Goal status: MET  10/25/22 - 3.40 ft/sec with rollator  6.  Patient will improve Berg score to >/= 41/56 to improve safety and stability with ADLs in standing and reduce risk for falls. (MCID= 8 points)  Baseline: 33/56  (43/56 as of DC from PT in 2019); 40/56 (09/04/22) Goal status: MET  10/25/22 - Berg: 46/56  7.  Patient will demonstrate at least 19/24 on DGI to improve gait stability and reduce risk for falls. Baseline: 13/24 on 08/14/22  (20/24 as of DC from PT in 2019); 09/24/22 - DGI: 14/24; 11/01/22 - 11/24 Goal status: NOT MET  11/05/22 - 16/24  8  Patient will report >/= 50% on ABC scale to demonstrate improved functional ability. Baseline: 520 / 1600 = 32.5 % Goal status: NOT MET - 11/05/22 - 450 / 1600 = 28.1 %   PLAN:  PT FREQUENCY: 1-2x/week, starting at 2x/wk, but reducing frequency to 1x/wk if too overwhelmed  PT DURATION: 6 weeks  PLANNED INTERVENTIONS: Therapeutic exercises, Therapeutic activity, Neuromuscular re-education, Balance training, Gait training, Patient/Family education,  Self Care, Joint mobilization, Stair training, DME instructions, Dry Needling, Electrical stimulation, Cryotherapy, Moist heat, Taping, Ultrasound, Manual therapy, and Re-evaluation  PLAN FOR NEXT SESSION: transition to HEP + D/C from PT    PHYSICAL THERAPY DISCHARGE SUMMARY  Visits from Start of Care: 21  Current functional level related to goals / functional outcomes: Refer to above clinical impression and goal assessment.   Remaining deficits: As above. Balance confidence remains limited with high risks for falls per DGI, therefore recommended full-time use of rollator/4WW for all ambulation.   Education / Equipment: HEP, fall prevention education   Patient agrees to discharge. Patient goals were partially met. Patient is being discharged due to being pleased with the current functional level.   Marry Guan, PT 11/05/22 2:01 PM

## 2022-11-21 DIAGNOSIS — R972 Elevated prostate specific antigen [PSA]: Secondary | ICD-10-CM | POA: Diagnosis not present

## 2022-11-23 DIAGNOSIS — Z23 Encounter for immunization: Secondary | ICD-10-CM | POA: Diagnosis not present

## 2022-12-31 DIAGNOSIS — M0589 Other rheumatoid arthritis with rheumatoid factor of multiple sites: Secondary | ICD-10-CM | POA: Diagnosis not present

## 2023-01-08 DIAGNOSIS — H524 Presbyopia: Secondary | ICD-10-CM | POA: Diagnosis not present

## 2023-01-08 DIAGNOSIS — H518 Other specified disorders of binocular movement: Secondary | ICD-10-CM | POA: Diagnosis not present

## 2023-01-08 DIAGNOSIS — H40053 Ocular hypertension, bilateral: Secondary | ICD-10-CM | POA: Diagnosis not present

## 2023-01-08 DIAGNOSIS — H5212 Myopia, left eye: Secondary | ICD-10-CM | POA: Diagnosis not present

## 2023-01-08 DIAGNOSIS — H532 Diplopia: Secondary | ICD-10-CM | POA: Diagnosis not present

## 2023-01-08 DIAGNOSIS — H52203 Unspecified astigmatism, bilateral: Secondary | ICD-10-CM | POA: Diagnosis not present

## 2023-01-31 DIAGNOSIS — I10 Essential (primary) hypertension: Secondary | ICD-10-CM | POA: Diagnosis not present

## 2023-01-31 DIAGNOSIS — N182 Chronic kidney disease, stage 2 (mild): Secondary | ICD-10-CM | POA: Diagnosis not present

## 2023-02-04 DIAGNOSIS — L570 Actinic keratosis: Secondary | ICD-10-CM | POA: Diagnosis not present

## 2023-02-04 DIAGNOSIS — X32XXXD Exposure to sunlight, subsequent encounter: Secondary | ICD-10-CM | POA: Diagnosis not present

## 2023-02-07 DIAGNOSIS — E0590 Thyrotoxicosis, unspecified without thyrotoxic crisis or storm: Secondary | ICD-10-CM | POA: Diagnosis not present

## 2023-02-07 DIAGNOSIS — J432 Centrilobular emphysema: Secondary | ICD-10-CM | POA: Diagnosis not present

## 2023-02-07 DIAGNOSIS — I10 Essential (primary) hypertension: Secondary | ICD-10-CM | POA: Diagnosis not present

## 2023-02-07 DIAGNOSIS — M150 Primary generalized (osteo)arthritis: Secondary | ICD-10-CM | POA: Diagnosis not present

## 2023-02-07 DIAGNOSIS — I130 Hypertensive heart and chronic kidney disease with heart failure and stage 1 through stage 4 chronic kidney disease, or unspecified chronic kidney disease: Secondary | ICD-10-CM | POA: Diagnosis not present

## 2023-02-07 DIAGNOSIS — D692 Other nonthrombocytopenic purpura: Secondary | ICD-10-CM | POA: Diagnosis not present

## 2023-02-07 DIAGNOSIS — D6869 Other thrombophilia: Secondary | ICD-10-CM | POA: Diagnosis not present

## 2023-02-07 DIAGNOSIS — M0589 Other rheumatoid arthritis with rheumatoid factor of multiple sites: Secondary | ICD-10-CM | POA: Diagnosis not present

## 2023-02-07 DIAGNOSIS — N182 Chronic kidney disease, stage 2 (mild): Secondary | ICD-10-CM | POA: Diagnosis not present

## 2023-02-07 DIAGNOSIS — I4891 Unspecified atrial fibrillation: Secondary | ICD-10-CM | POA: Diagnosis not present

## 2023-02-07 DIAGNOSIS — I5032 Chronic diastolic (congestive) heart failure: Secondary | ICD-10-CM | POA: Diagnosis not present

## 2023-02-07 DIAGNOSIS — I77810 Thoracic aortic ectasia: Secondary | ICD-10-CM | POA: Diagnosis not present

## 2023-02-25 DIAGNOSIS — Z79899 Other long term (current) drug therapy: Secondary | ICD-10-CM | POA: Diagnosis not present

## 2023-02-25 DIAGNOSIS — M0589 Other rheumatoid arthritis with rheumatoid factor of multiple sites: Secondary | ICD-10-CM | POA: Diagnosis not present

## 2023-02-25 DIAGNOSIS — M549 Dorsalgia, unspecified: Secondary | ICD-10-CM | POA: Diagnosis not present

## 2023-02-25 DIAGNOSIS — I10 Essential (primary) hypertension: Secondary | ICD-10-CM | POA: Diagnosis not present

## 2023-02-25 DIAGNOSIS — R7612 Nonspecific reaction to cell mediated immunity measurement of gamma interferon antigen response without active tuberculosis: Secondary | ICD-10-CM | POA: Diagnosis not present

## 2023-02-25 DIAGNOSIS — M1990 Unspecified osteoarthritis, unspecified site: Secondary | ICD-10-CM | POA: Diagnosis not present

## 2023-02-25 DIAGNOSIS — M4120 Other idiopathic scoliosis, site unspecified: Secondary | ICD-10-CM | POA: Diagnosis not present

## 2023-02-25 DIAGNOSIS — R059 Cough, unspecified: Secondary | ICD-10-CM | POA: Diagnosis not present

## 2023-02-28 DIAGNOSIS — J432 Centrilobular emphysema: Secondary | ICD-10-CM | POA: Diagnosis not present

## 2023-02-28 DIAGNOSIS — I130 Hypertensive heart and chronic kidney disease with heart failure and stage 1 through stage 4 chronic kidney disease, or unspecified chronic kidney disease: Secondary | ICD-10-CM | POA: Diagnosis not present

## 2023-02-28 DIAGNOSIS — G609 Hereditary and idiopathic neuropathy, unspecified: Secondary | ICD-10-CM | POA: Diagnosis not present

## 2023-02-28 DIAGNOSIS — M0589 Other rheumatoid arthritis with rheumatoid factor of multiple sites: Secondary | ICD-10-CM | POA: Diagnosis not present

## 2023-02-28 DIAGNOSIS — I77810 Thoracic aortic ectasia: Secondary | ICD-10-CM | POA: Diagnosis not present

## 2023-02-28 DIAGNOSIS — I4891 Unspecified atrial fibrillation: Secondary | ICD-10-CM | POA: Diagnosis not present

## 2023-02-28 DIAGNOSIS — I5032 Chronic diastolic (congestive) heart failure: Secondary | ICD-10-CM | POA: Diagnosis not present

## 2023-02-28 DIAGNOSIS — N182 Chronic kidney disease, stage 2 (mild): Secondary | ICD-10-CM | POA: Diagnosis not present

## 2023-02-28 DIAGNOSIS — D6869 Other thrombophilia: Secondary | ICD-10-CM | POA: Diagnosis not present

## 2023-03-30 ENCOUNTER — Emergency Department (HOSPITAL_COMMUNITY)

## 2023-03-30 ENCOUNTER — Other Ambulatory Visit: Payer: Self-pay

## 2023-03-30 ENCOUNTER — Inpatient Hospital Stay (HOSPITAL_COMMUNITY)
Admission: EM | Admit: 2023-03-30 | Discharge: 2023-04-03 | DRG: 481 | Disposition: A | Discharge status: Skilled Nursing Facility | Attending: Internal Medicine | Admitting: Internal Medicine

## 2023-03-30 ENCOUNTER — Encounter (HOSPITAL_COMMUNITY): Payer: Self-pay | Admitting: Internal Medicine

## 2023-03-30 DIAGNOSIS — Z7952 Long term (current) use of systemic steroids: Secondary | ICD-10-CM

## 2023-03-30 DIAGNOSIS — N400 Enlarged prostate without lower urinary tract symptoms: Secondary | ICD-10-CM | POA: Insufficient documentation

## 2023-03-30 DIAGNOSIS — Z5982 Transportation insecurity: Secondary | ICD-10-CM

## 2023-03-30 DIAGNOSIS — S72142A Displaced intertrochanteric fracture of left femur, initial encounter for closed fracture: Secondary | ICD-10-CM | POA: Diagnosis not present

## 2023-03-30 DIAGNOSIS — S199XXA Unspecified injury of neck, initial encounter: Secondary | ICD-10-CM | POA: Diagnosis not present

## 2023-03-30 DIAGNOSIS — S72002A Fracture of unspecified part of neck of left femur, initial encounter for closed fracture: Secondary | ICD-10-CM | POA: Diagnosis not present

## 2023-03-30 DIAGNOSIS — H6123 Impacted cerumen, bilateral: Secondary | ICD-10-CM | POA: Diagnosis not present

## 2023-03-30 DIAGNOSIS — Z9841 Cataract extraction status, right eye: Secondary | ICD-10-CM | POA: Diagnosis not present

## 2023-03-30 DIAGNOSIS — I5032 Chronic diastolic (congestive) heart failure: Secondary | ICD-10-CM | POA: Diagnosis present

## 2023-03-30 DIAGNOSIS — R531 Weakness: Secondary | ICD-10-CM | POA: Diagnosis not present

## 2023-03-30 DIAGNOSIS — R062 Wheezing: Secondary | ICD-10-CM | POA: Diagnosis present

## 2023-03-30 DIAGNOSIS — M069 Rheumatoid arthritis, unspecified: Secondary | ICD-10-CM | POA: Diagnosis not present

## 2023-03-30 DIAGNOSIS — D72829 Elevated white blood cell count, unspecified: Secondary | ICD-10-CM | POA: Diagnosis not present

## 2023-03-30 DIAGNOSIS — Z79899 Other long term (current) drug therapy: Secondary | ICD-10-CM | POA: Diagnosis not present

## 2023-03-30 DIAGNOSIS — I350 Nonrheumatic aortic (valve) stenosis: Secondary | ICD-10-CM | POA: Diagnosis not present

## 2023-03-30 DIAGNOSIS — S0093XD Contusion of unspecified part of head, subsequent encounter: Secondary | ICD-10-CM | POA: Diagnosis not present

## 2023-03-30 DIAGNOSIS — Z825 Family history of asthma and other chronic lower respiratory diseases: Secondary | ICD-10-CM

## 2023-03-30 DIAGNOSIS — I130 Hypertensive heart and chronic kidney disease with heart failure and stage 1 through stage 4 chronic kidney disease, or unspecified chronic kidney disease: Secondary | ICD-10-CM | POA: Diagnosis not present

## 2023-03-30 DIAGNOSIS — Z743 Need for continuous supervision: Secondary | ICD-10-CM | POA: Diagnosis not present

## 2023-03-30 DIAGNOSIS — G4733 Obstructive sleep apnea (adult) (pediatric): Secondary | ICD-10-CM | POA: Diagnosis present

## 2023-03-30 DIAGNOSIS — W002XXA Other fall from one level to another due to ice and snow, initial encounter: Secondary | ICD-10-CM | POA: Diagnosis present

## 2023-03-30 DIAGNOSIS — I700 Atherosclerosis of aorta: Secondary | ICD-10-CM | POA: Diagnosis not present

## 2023-03-30 DIAGNOSIS — Z01810 Encounter for preprocedural cardiovascular examination: Secondary | ICD-10-CM

## 2023-03-30 DIAGNOSIS — I351 Nonrheumatic aortic (valve) insufficiency: Secondary | ICD-10-CM | POA: Diagnosis not present

## 2023-03-30 DIAGNOSIS — Z969 Presence of functional implant, unspecified: Secondary | ICD-10-CM | POA: Diagnosis not present

## 2023-03-30 DIAGNOSIS — Z96642 Presence of left artificial hip joint: Secondary | ICD-10-CM | POA: Diagnosis not present

## 2023-03-30 DIAGNOSIS — I6782 Cerebral ischemia: Secondary | ICD-10-CM | POA: Diagnosis not present

## 2023-03-30 DIAGNOSIS — S0990XA Unspecified injury of head, initial encounter: Secondary | ICD-10-CM | POA: Diagnosis not present

## 2023-03-30 DIAGNOSIS — S41112A Laceration without foreign body of left upper arm, initial encounter: Secondary | ICD-10-CM | POA: Diagnosis present

## 2023-03-30 DIAGNOSIS — Z9181 History of falling: Secondary | ICD-10-CM | POA: Diagnosis not present

## 2023-03-30 DIAGNOSIS — Z7901 Long term (current) use of anticoagulants: Secondary | ICD-10-CM

## 2023-03-30 DIAGNOSIS — R0902 Hypoxemia: Secondary | ICD-10-CM | POA: Diagnosis present

## 2023-03-30 DIAGNOSIS — S7002XA Contusion of left hip, initial encounter: Secondary | ICD-10-CM | POA: Diagnosis not present

## 2023-03-30 DIAGNOSIS — F17290 Nicotine dependence, other tobacco product, uncomplicated: Secondary | ICD-10-CM | POA: Diagnosis not present

## 2023-03-30 DIAGNOSIS — Z9842 Cataract extraction status, left eye: Secondary | ICD-10-CM

## 2023-03-30 DIAGNOSIS — Z4889 Encounter for other specified surgical aftercare: Secondary | ICD-10-CM | POA: Diagnosis not present

## 2023-03-30 DIAGNOSIS — Z299 Encounter for prophylactic measures, unspecified: Secondary | ICD-10-CM | POA: Diagnosis not present

## 2023-03-30 DIAGNOSIS — M25552 Pain in left hip: Secondary | ICD-10-CM | POA: Diagnosis not present

## 2023-03-30 DIAGNOSIS — R001 Bradycardia, unspecified: Secondary | ICD-10-CM | POA: Diagnosis not present

## 2023-03-30 DIAGNOSIS — E876 Hypokalemia: Secondary | ICD-10-CM | POA: Diagnosis present

## 2023-03-30 DIAGNOSIS — I340 Nonrheumatic mitral (valve) insufficiency: Secondary | ICD-10-CM | POA: Diagnosis not present

## 2023-03-30 DIAGNOSIS — W010XXA Fall on same level from slipping, tripping and stumbling without subsequent striking against object, initial encounter: Principal | ICD-10-CM

## 2023-03-30 DIAGNOSIS — W19XXXA Unspecified fall, initial encounter: Secondary | ICD-10-CM | POA: Diagnosis not present

## 2023-03-30 DIAGNOSIS — I480 Paroxysmal atrial fibrillation: Secondary | ICD-10-CM | POA: Diagnosis not present

## 2023-03-30 DIAGNOSIS — S0093XA Contusion of unspecified part of head, initial encounter: Secondary | ICD-10-CM | POA: Diagnosis present

## 2023-03-30 DIAGNOSIS — Y92009 Unspecified place in unspecified non-institutional (private) residence as the place of occurrence of the external cause: Secondary | ICD-10-CM

## 2023-03-30 DIAGNOSIS — M160 Bilateral primary osteoarthritis of hip: Secondary | ICD-10-CM | POA: Diagnosis not present

## 2023-03-30 DIAGNOSIS — I10 Essential (primary) hypertension: Secondary | ICD-10-CM | POA: Diagnosis not present

## 2023-03-30 DIAGNOSIS — J984 Other disorders of lung: Secondary | ICD-10-CM | POA: Diagnosis not present

## 2023-03-30 DIAGNOSIS — Z66 Do not resuscitate: Secondary | ICD-10-CM | POA: Diagnosis not present

## 2023-03-30 DIAGNOSIS — S72142D Displaced intertrochanteric fracture of left femur, subsequent encounter for closed fracture with routine healing: Secondary | ICD-10-CM | POA: Diagnosis not present

## 2023-03-30 DIAGNOSIS — Z28311 Partially vaccinated for covid-19: Secondary | ICD-10-CM | POA: Diagnosis not present

## 2023-03-30 DIAGNOSIS — R1110 Vomiting, unspecified: Secondary | ICD-10-CM | POA: Diagnosis not present

## 2023-03-30 DIAGNOSIS — R0989 Other specified symptoms and signs involving the circulatory and respiratory systems: Secondary | ICD-10-CM | POA: Diagnosis not present

## 2023-03-30 DIAGNOSIS — K449 Diaphragmatic hernia without obstruction or gangrene: Secondary | ICD-10-CM | POA: Diagnosis not present

## 2023-03-30 DIAGNOSIS — Z23 Encounter for immunization: Secondary | ICD-10-CM | POA: Diagnosis not present

## 2023-03-30 DIAGNOSIS — N1831 Chronic kidney disease, stage 3a: Secondary | ICD-10-CM | POA: Diagnosis present

## 2023-03-30 DIAGNOSIS — R011 Cardiac murmur, unspecified: Secondary | ICD-10-CM | POA: Diagnosis not present

## 2023-03-30 DIAGNOSIS — I4891 Unspecified atrial fibrillation: Secondary | ICD-10-CM | POA: Diagnosis not present

## 2023-03-30 DIAGNOSIS — D62 Acute posthemorrhagic anemia: Secondary | ICD-10-CM

## 2023-03-30 DIAGNOSIS — I63521 Cerebral infarction due to unspecified occlusion or stenosis of right anterior cerebral artery: Secondary | ICD-10-CM | POA: Diagnosis not present

## 2023-03-30 DIAGNOSIS — I129 Hypertensive chronic kidney disease with stage 1 through stage 4 chronic kidney disease, or unspecified chronic kidney disease: Secondary | ICD-10-CM | POA: Diagnosis not present

## 2023-03-30 DIAGNOSIS — S72002P Fracture of unspecified part of neck of left femur, subsequent encounter for closed fracture with malunion: Secondary | ICD-10-CM | POA: Diagnosis not present

## 2023-03-30 DIAGNOSIS — U071 COVID-19: Secondary | ICD-10-CM | POA: Diagnosis not present

## 2023-03-30 DIAGNOSIS — I482 Chronic atrial fibrillation, unspecified: Secondary | ICD-10-CM | POA: Diagnosis not present

## 2023-03-30 DIAGNOSIS — Z751 Person awaiting admission to adequate facility elsewhere: Secondary | ICD-10-CM | POA: Diagnosis not present

## 2023-03-30 DIAGNOSIS — M059 Rheumatoid arthritis with rheumatoid factor, unspecified: Secondary | ICD-10-CM | POA: Diagnosis not present

## 2023-03-30 DIAGNOSIS — R918 Other nonspecific abnormal finding of lung field: Secondary | ICD-10-CM | POA: Diagnosis not present

## 2023-03-30 DIAGNOSIS — R609 Edema, unspecified: Secondary | ICD-10-CM | POA: Diagnosis not present

## 2023-03-30 LAB — SURGICAL PCR SCREEN
MRSA, PCR: NEGATIVE
Staphylococcus aureus: NEGATIVE

## 2023-03-30 LAB — COMPREHENSIVE METABOLIC PANEL
ALT: 22 U/L (ref 0–44)
AST: 31 U/L (ref 15–41)
Albumin: 3.3 g/dL — ABNORMAL LOW (ref 3.5–5.0)
Alkaline Phosphatase: 61 U/L (ref 38–126)
Anion gap: 11 (ref 5–15)
BUN: 21 mg/dL (ref 8–23)
CO2: 22 mmol/L (ref 22–32)
Calcium: 8.7 mg/dL — ABNORMAL LOW (ref 8.9–10.3)
Chloride: 103 mmol/L (ref 98–111)
Creatinine, Ser: 1.17 mg/dL (ref 0.61–1.24)
GFR, Estimated: 59 mL/min — ABNORMAL LOW (ref >60)
Glucose, Bld: 150 mg/dL — ABNORMAL HIGH (ref 70–99)
Potassium: 4.2 mmol/L (ref 3.5–5.1)
Sodium: 136 mmol/L (ref 135–145)
Total Bilirubin: 0.7 mg/dL (ref 0.0–1.2)
Total Protein: 5.9 g/dL — ABNORMAL LOW (ref 6.5–8.1)

## 2023-03-30 LAB — CBC
HCT: 32 % — ABNORMAL LOW (ref 39.0–52.0)
Hemoglobin: 10.3 g/dL — ABNORMAL LOW (ref 13.0–17.0)
MCH: 30.7 pg (ref 26.0–34.0)
MCHC: 32.2 g/dL (ref 30.0–36.0)
MCV: 95.5 fL (ref 80.0–100.0)
Platelets: 256 10*3/uL (ref 150–400)
RBC: 3.35 MIL/uL — ABNORMAL LOW (ref 4.22–5.81)
RDW: 19.4 % — ABNORMAL HIGH (ref 11.5–15.5)
WBC: 9.7 10*3/uL (ref 4.0–10.5)
nRBC: 0 % (ref 0.0–0.2)

## 2023-03-30 MED ORDER — PREDNISONE 5 MG PO TABS
5.0000 mg | ORAL_TABLET | Freq: Every day | ORAL | Status: DC
  Administered 2023-03-31 – 2023-04-03 (×4): 5 mg via ORAL
  Filled 2023-03-30 (×4): qty 1

## 2023-03-30 MED ORDER — FENTANYL CITRATE PF 50 MCG/ML IJ SOSY
12.5000 ug | PREFILLED_SYRINGE | INTRAMUSCULAR | Status: DC | PRN
  Administered 2023-03-30 – 2023-04-03 (×5): 12.5 ug via INTRAVENOUS
  Filled 2023-03-30 (×5): qty 1

## 2023-03-30 MED ORDER — HYDROCHLOROTHIAZIDE 12.5 MG PO TABS
12.5000 mg | ORAL_TABLET | Freq: Every day | ORAL | Status: DC
Start: 2023-03-31 — End: 2023-04-01
  Administered 2023-03-31: 12.5 mg via ORAL
  Filled 2023-03-30: qty 1

## 2023-03-30 MED ORDER — SODIUM CHLORIDE 0.9% FLUSH
3.0000 mL | Freq: Two times a day (BID) | INTRAVENOUS | Status: DC
  Administered 2023-03-30 – 2023-04-03 (×8): 3 mL via INTRAVENOUS

## 2023-03-30 MED ORDER — ONDANSETRON HCL 4 MG/2ML IJ SOLN: 4.0000 mg | Freq: Four times a day (QID) | INTRAMUSCULAR | Status: DC | PRN

## 2023-03-30 MED ORDER — LATANOPROST 0.005 % OP SOLN
1.0000 [drp] | Freq: Every day | OPHTHALMIC | Status: DC
  Administered 2023-03-30 – 2023-04-02 (×4): 1 [drp] via OPHTHALMIC
  Filled 2023-03-30: qty 2.5

## 2023-03-30 MED ORDER — MORPHINE SULFATE (PF) 2 MG/ML IV SOLN
2.0000 mg | Freq: Once | INTRAVENOUS | Status: AC
  Administered 2023-03-30: 2 mg via INTRAVENOUS
  Filled 2023-03-30: qty 1

## 2023-03-30 MED ORDER — OXYCODONE HCL 5 MG PO TABS: 10.0000 mg | ORAL_TABLET | Freq: Four times a day (QID) | ORAL | Status: DC | PRN

## 2023-03-30 MED ORDER — MUPIROCIN 2 % EX OINT: 1.0000 | TOPICAL_OINTMENT | Freq: Two times a day (BID) | CUTANEOUS | Status: DC

## 2023-03-30 MED ORDER — AMLODIPINE BESYLATE 10 MG PO TABS
10.0000 mg | ORAL_TABLET | Freq: Every day | ORAL | Status: DC
  Administered 2023-03-31: 10 mg via ORAL
  Filled 2023-03-30: qty 1

## 2023-03-30 MED ORDER — OXYCODONE HCL 5 MG PO TABS: 5.0000 mg | ORAL_TABLET | Freq: Four times a day (QID) | ORAL | Status: DC | PRN

## 2023-03-30 MED ORDER — MORPHINE SULFATE (PF) 2 MG/ML IV SOLN
1.0000 mg | INTRAVENOUS | Status: DC | PRN
  Administered 2023-03-31: 1 mg via INTRAVENOUS
  Filled 2023-03-30: qty 1

## 2023-03-30 MED ORDER — TAMSULOSIN HCL 0.4 MG PO CAPS: 0.4000 mg | ORAL_CAPSULE | Freq: Every evening | ORAL | Status: DC

## 2023-03-30 MED ORDER — LISINOPRIL-HYDROCHLOROTHIAZIDE 20-12.5 MG PO TABS: 1.0000 | ORAL_TABLET | Freq: Every day | ORAL | Status: DC

## 2023-03-30 MED ORDER — ONDANSETRON HCL 4 MG/2ML IJ SOLN
4.0000 mg | Freq: Once | INTRAMUSCULAR | Status: AC
  Administered 2023-03-30: 4 mg via INTRAVENOUS
  Filled 2023-03-30: qty 2

## 2023-03-30 MED ORDER — OXYCODONE-ACETAMINOPHEN 5-325 MG PO TABS: 1.0000 | ORAL_TABLET | Freq: Four times a day (QID) | ORAL | Status: DC | PRN

## 2023-03-30 MED ORDER — FINASTERIDE 5 MG PO TABS: 5.0000 mg | ORAL_TABLET | Freq: Every evening | ORAL | Status: DC

## 2023-03-30 MED ORDER — LISINOPRIL 20 MG PO TABS
20.0000 mg | ORAL_TABLET | Freq: Every day | ORAL | Status: DC
  Administered 2023-03-31: 20 mg via ORAL
  Filled 2023-03-30: qty 1

## 2023-03-30 MED ORDER — FOLIC ACID 1 MG PO TABS
3.0000 mg | ORAL_TABLET | Freq: Every day | ORAL | Status: DC
  Administered 2023-04-01 – 2023-04-03 (×3): 3 mg via ORAL
  Filled 2023-03-30 (×3): qty 3

## 2023-03-30 MED ORDER — ENOXAPARIN SODIUM 40 MG/0.4ML IJ SOSY
40.0000 mg | PREFILLED_SYRINGE | INTRAMUSCULAR | Status: DC
  Administered 2023-03-30 – 2023-03-31 (×2): 40 mg via SUBCUTANEOUS
  Filled 2023-03-30 (×2): qty 0.4

## 2023-03-30 MED ORDER — ONDANSETRON HCL 4 MG PO TABS: 4.0000 mg | ORAL_TABLET | Freq: Four times a day (QID) | ORAL | Status: DC | PRN

## 2023-03-30 MED ORDER — ORAL CARE MOUTH RINSE: 15.0000 mL | OROMUCOSAL | Status: DC | PRN

## 2023-03-30 MED ORDER — LACTATED RINGERS IV BOLUS
1000.0000 mL | Freq: Once | INTRAVENOUS | Status: AC
  Administered 2023-03-30: 1000 mL via INTRAVENOUS

## 2023-03-30 NOTE — Progress Notes (Signed)
 Transition of Care [facility]) - CAGE-AID Screening   Patient Details  Name: Cory Gonzalez MRN: [id number] Date of Birth: 1929/07/08   Hewitt Shorts, RN Trauma Response Nurse Phone Number: [phone] 03/30/2023, 10:49 AM    CAGE-AID Screening:    Have You Ever Felt You Ought to Cut Down on Your Drinking or Drug Use?: No Have People Annoyed You By Critizing Your Drinking Or Drug Use?: No Have You Felt Bad Or Guilty About Your Drinking Or Drug Use?: No Have You Ever Had a Drink or Used Drugs First Thing In The Morning to Steady Your Nerves or to Get Rid of a Hangover?: No CAGE-AID Score: 0  Substance Abuse Education Offered: (S) No (No services required)

## 2023-03-30 NOTE — Consult Note (Signed)
 Reason for Consult: Left hip fracture Referring Physician: ED  Cory Gonzalez is an 89 y.o. male.  HPI: Cory Gonzalez is a 89 y.o. male with medical history significant of A-fib on Xarelto, chronic diastolic heart failure, hypertension, rheumatoid arthritis who presents after a fall at home.  Wife is at bedside who gives history.  She states that patient was trying to fix the ice machine, lost balance and fell backward.  EMS was called.  Otherwise has been in good health recently.  States that the morphine received in the ER did not help much with pain.  Believes his last Xarelto dose was Thursday 2/27   Past Medical History:  Diagnosis Date   Chronic diastolic heart failure (HCC)    Dysrhythmia    A-fib   FUO (fever of unknown origin) 07/27/2019   Heart murmur    since childhood, never has had any problems   Hypertension    Paroxysmal atrial fibrillation (HCC)    Pneumonia    PONV (postoperative nausea and vomiting)    a little nausea   Rheumatoid arthritis (HCC)    Tick bite 07/27/2019    Past Surgical History:  Procedure Laterality Date   CATARACT EXTRACTION Bilateral    COLONOSCOPY     EXCISION MASS HEAD Left 03/29/2022   Procedure: EXCISION OF EAR MOH'S DEFECT;  Surgeon: Scarlette Ar, MD;  Location: [facility] OR;  Service: ENT;  Laterality: Left;   NASAL FLAP ROTATION Left 03/29/2022   Procedure: POSSIBLE FLAP ROTATION; POSSIBLE EAR CARTILAGE GRAFT;  Surgeon: Scarlette Ar, MD;  Location: [facility] OR;  Service: ENT;  Laterality: Left;   ORIF ELBOW FRACTURE Right 09/18/2017   Procedure: OPEN REDUCTION INTERNAL FIXATION (ORIF) RIGHT ELBOW/OLECRANON FRACTURE;  Surgeon: Eldred Manges, MD;  Location: [facility] OR;  Service: Orthopedics;  Laterality: Right;   PROSTATE SURGERY     SKIN FULL THICKNESS GRAFT Left 03/29/2022   Procedure: ADJACENT TISSUE TRANSFER;  SKIN GRAFT;  Surgeon: Scarlette Ar, MD;  Location: [facility] OR;  Service: ENT;  Laterality: Left;    Family History  Problem Relation Age of Onset    COPD Sister    Dementia Sister    Aneurysm Brother     Social History:  reports that he has been smoking pipe. He has quit using smokeless tobacco. He reports that he does not drink alcohol and does not use drugs.  Allergies: No Known Allergies  Medications: I have reviewed the patient's current medications.  Results for orders placed or performed during the hospital encounter of 03/30/23 (from the past 48 hours)  Type and screen     Status: None   Collection Time: 03/30/23  8:56 AM  Result Value Ref Range   ABO/RH(D) B POS    Antibody Screen NEG    Sample Expiration      04/02/2023,2359 Performed at [facility], [address] [place], [place] [address]   CBC     Status: Abnormal   Collection Time: 03/30/23  8:57 AM  Result Value Ref Range   WBC 9.7 4.0 - 10.5 K/uL   RBC 3.35 (L) 4.22 - 5.81 MIL/uL   Hemoglobin 10.3 (L) 13.0 - 17.0 g/dL   HCT 44.0 (L) 34.7 - 42.5 %   MCV 95.5 80.0 - 100.0 fL   MCH 30.7 26.0 - 34.0 pg   MCHC 32.2 30.0 - 36.0 g/dL   RDW 95.6 (H) 38.7 - 56.4 %   Platelets 256 150 - 400 K/uL   nRBC 0.0 0.0 -  0.2 %    Comment: Performed at [facility], [address] [place], [place] [address]  Comprehensive metabolic panel     Status: Abnormal   Collection Time: 03/30/23  8:57 AM  Result Value Ref Range   Sodium 136 135 - 145 mmol/L   Potassium 4.2 3.5 - 5.1 mmol/L   Chloride 103 98 - 111 mmol/L   CO2 22 22 - 32 mmol/L   Glucose, Bld 150 (H) 70 - 99 mg/dL    Comment: Glucose reference range applies only to samples taken after fasting for at least 8 hours.   BUN 21 8 - 23 mg/dL   Creatinine, Ser 6.43 0.61 - 1.24 mg/dL   Calcium 8.7 (L) 8.9 - 10.3 mg/dL   Total Protein 5.9 (L) 6.5 - 8.1 g/dL   Albumin 3.3 (L) 3.5 - 5.0 g/dL   AST 31 15 - 41 U/L   ALT 22 0 - 44 U/L   Alkaline Phosphatase 61 38 - 126 U/L   Total Bilirubin 0.7 0.0 - 1.2 mg/dL   GFR, Estimated 59 (L) >60 mL/min    Comment: (NOTE) Calculated using the CKD-EPI Creatinine  Equation (2021)    Anion gap 11 5 - 15    Comment: Performed at [facility], [address] [place], [place] [address]    DG Chest 1 View Result Date: 03/30/2023 CLINICAL DATA:  Status post fall.  Possible vomiting. EXAM: CHEST  1 VIEW COMPARISON:  Chest CT 09/12/2020. Radiographs 07/27/2019 and 10/09/2017. FINDINGS: 0914 hours. Evaluation of the left lung base is limited by what appears to be the patient's overlying left hand. The heart size and mediastinal contours are stable for technique. There is mild aortic atherosclerosis and a hiatal hernia. Chronic central airway thickening and scarring at both lung bases without definite superimposed airspace disease, pneumothorax or significant pleural effusion. No acute osseous findings are evident. There is evidence of a chronic rotator cuff tear on the right. IMPRESSION: No definite acute cardiopulmonary process. Chronic central airway thickening and scarring at both lung bases. Limited evaluation of the left lung base due to the patient's overlapping hand. Electronically Signed   By: Carey Bullocks M.D.   On: 03/30/2023 10:17   DG Hip Unilat With Pelvis 2-3 Views Left Result Date: 03/30/2023 CLINICAL DATA:  Status post fall.  Pain. EXAM: DG HIP (WITH OR WITHOUT PELVIS) 2-3V LEFT COMPARISON:  None Available. FINDINGS: There is in acute, comminuted fracture deformity involving the intertrochanteric portions of the proximal left femur. There is medial angulation of the distal fracture fragments. No dislocation. Mild degenerative changes noted within both hips. Signs of penile prosthesis. IMPRESSION: Acute, comminuted intertrochanteric fracture deformity of the proximal left femur. Electronically Signed   By: Signa Kell M.D.   On: 03/30/2023 10:13   CT Cervical Spine Wo Contrast Result Date: 03/30/2023 CLINICAL DATA:  Neck trauma.  Head trauma. EXAM: CT HEAD WITHOUT CONTRAST CT CERVICAL SPINE WITHOUT CONTRAST TECHNIQUE: Multidetector CT imaging of  the head and cervical spine was performed following the standard protocol without intravenous contrast. Multiplanar CT image reconstructions of the cervical spine were also generated. RADIATION DOSE REDUCTION: This exam was performed according to the departmental dose-optimization program which includes automated exposure control, adjustment of the mA and/or kV according to patient size and/or use of iterative reconstruction technique. 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 Small areas of cortical encephalomalacia  in the left inferior temporal and low right parietal cortex. Low-density in the cerebral white matter attributed to chronic small vessel ischemia. No evidence of acute hemorrhage, hydrocephalus, mass, or collection. Vascular: No hyperdense vessel or unexpected calcification. Skull: Normal. Negative for fracture or focal lesion. Sinuses/Orbits: Negative CT CERVICAL SPINE FINDINGS Alignment: No traumatic malalignment Skull base and vertebrae: No acute fracture Soft tissues and spinal canal: No prevertebral fluid or swelling. No visible canal hematoma. Disc levels:  Generalized degenerative endplate and facet spurring. Upper chest: Emphysema. IMPRESSION: 1. No evidence of acute intracranial or cervical spine injury. 2. 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 Electronically Signed   By: Tiburcio Pea M.D.   On: 03/30/2023 10:12   CT Head Wo Contrast Result Date: 03/30/2023 CLINICAL DATA:  Neck trauma.  Head trauma. EXAM: CT HEAD WITHOUT CONTRAST CT CERVICAL SPINE WITHOUT CONTRAST TECHNIQUE: Multidetector CT imaging of the head and cervical spine was performed following the standard protocol without intravenous contrast. Multiplanar CT image reconstructions of the cervical spine were also generated. RADIATION DOSE REDUCTION: This exam was performed  according to the departmental dose-optimization program which includes automated exposure control, adjustment of the mA and/or kV according to patient size and/or use of iterative reconstruction technique. 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 Small areas of cortical encephalomalacia in the left inferior temporal and low right parietal cortex. Low-density in the cerebral white matter attributed to chronic small vessel ischemia. No evidence of acute hemorrhage, hydrocephalus, mass, or collection. Vascular: No hyperdense vessel or unexpected calcification. Skull: Normal. Negative for fracture or focal lesion. Sinuses/Orbits: Negative CT CERVICAL SPINE FINDINGS Alignment: No traumatic malalignment Skull base and vertebrae: No acute fracture Soft tissues and spinal canal: No prevertebral fluid or swelling. No visible canal hematoma. Disc levels:  Generalized degenerative endplate and facet spurring. Upper chest: Emphysema. IMPRESSION: 1. No evidence of acute intracranial or cervical spine injury. 2. 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 Electronically Signed   By: Tiburcio Pea M.D.   On: 03/30/2023 10:12    Review of Systems  Constitutional:  Negative for chills and fever.  HENT:  Negative for congestion.   Respiratory:  Negative for cough and shortness of breath.   Cardiovascular:  Negative for chest pain.  Gastrointestinal:  Negative for abdominal pain, diarrhea and vomiting.  Genitourinary:  Negative for difficulty urinating.  Musculoskeletal:  Positive for arthralgias and myalgias.  Neurological:  Negative for dizziness and numbness.  Psychiatric/Behavioral:  Negative for confusion.    Blood pressure 127/66, pulse 63, temperature (!) 97.4 F (36.3 C), temperature source Oral, resp. rate 15, height 5\' 5"  (1.651 m), weight 63.5 kg, SpO2  94%. Physical Exam Constitutional:      General: He is not in acute distress.    Appearance: Normal appearance. He is normal weight.  HENT:     Head: Normocephalic and atraumatic.  Eyes:     Extraocular Movements: Extraocular movements intact.  Cardiovascular:     Rate and Rhythm: Normal rate.     Pulses: Normal pulses.  Pulmonary:     Effort: Pulmonary effort is normal.  Musculoskeletal:     Left hip: Tenderness present. Decreased range of motion.     Comments: Moderate swelling about the left hip. Tender to palpation about the left hip. No open wounds or abrasions about the LLE.  Calves soft and nontender. Distally neurovascularly intact in LE. Able to dorsiflex and plantar flex BLE. Skin tear on lateral left arm, dressing in place.   Skin:    Capillary Refill: Capillary refill takes less than 2 seconds.  Neurological:     General: No focal deficit present.     Mental Status: He is alert. Mental status is at baseline.  Psychiatric:        Mood and Affect: Mood normal.        Behavior: Behavior normal.     Assessment/Plan: Left intertrochanteric hip fracture Plan for left hip IM nail tomorrow. Patient to be NPO after midnight but may eat today. Medicine will admit patient. NWB at this time. Discussed the case with the patient and his wife including expected postoperative course, risks, and benefits. Dr Ave Filter posted case for tomorrow at [facility] L. [place], PA-C 03/30/2023, 1:19 PM

## 2023-03-30 NOTE — ED Notes (Signed)
 Patient HR noted to drop into the 30s, provider made aware and EKG captured. Patient had noted decreased level of consciousness during this episode as well.

## 2023-03-30 NOTE — H&P (View-Only) (Signed)
 Reason for Consult: Left hip fracture Referring Physician: ED  Cory Gonzalez is an 89 y.o. male.  HPI: Cory Gonzalez is a 89 y.o. male with medical history significant of A-fib on Xarelto, chronic diastolic heart failure, hypertension, rheumatoid arthritis who presents after a fall at home.  Wife is at bedside who gives history.  She states that patient was trying to fix the ice machine, lost balance and fell backward.  EMS was called.  Otherwise has been in good health recently.  States that the morphine received in the ER did not help much with pain.  Believes his last Xarelto dose was Thursday 2/27   Past Medical History:  Diagnosis Date   Chronic diastolic heart failure (HCC)    Dysrhythmia    A-fib   FUO (fever of unknown origin) 07/27/2019   Heart murmur    since childhood, never has had any problems   Hypertension    Paroxysmal atrial fibrillation (HCC)    Pneumonia    PONV (postoperative nausea and vomiting)    a little nausea   Rheumatoid arthritis (HCC)    Tick bite 07/27/2019    Past Surgical History:  Procedure Laterality Date   CATARACT EXTRACTION Bilateral    COLONOSCOPY     EXCISION MASS HEAD Left 03/29/2022   Procedure: EXCISION OF EAR MOH'S DEFECT;  Surgeon: Scarlette Ar, MD;  Location: [facility] OR;  Service: ENT;  Laterality: Left;   NASAL FLAP ROTATION Left 03/29/2022   Procedure: POSSIBLE FLAP ROTATION; POSSIBLE EAR CARTILAGE GRAFT;  Surgeon: Scarlette Ar, MD;  Location: [facility] OR;  Service: ENT;  Laterality: Left;   ORIF ELBOW FRACTURE Right 09/18/2017   Procedure: OPEN REDUCTION INTERNAL FIXATION (ORIF) RIGHT ELBOW/OLECRANON FRACTURE;  Surgeon: Eldred Manges, MD;  Location: [facility] OR;  Service: Orthopedics;  Laterality: Right;   PROSTATE SURGERY     SKIN FULL THICKNESS GRAFT Left 03/29/2022   Procedure: ADJACENT TISSUE TRANSFER;  SKIN GRAFT;  Surgeon: Scarlette Ar, MD;  Location: [facility] OR;  Service: ENT;  Laterality: Left;    Family History  Problem Relation Age of Onset    COPD Sister    Dementia Sister    Aneurysm Brother     Social History:  reports that he has been smoking pipe. He has quit using smokeless tobacco. He reports that he does not drink alcohol and does not use drugs.  Allergies: No Known Allergies  Medications: I have reviewed the patient's current medications.  Results for orders placed or performed during the hospital encounter of 03/30/23 (from the past 48 hours)  Type and screen     Status: None   Collection Time: 03/30/23  8:56 AM  Result Value Ref Range   ABO/RH(D) B POS    Antibody Screen NEG    Sample Expiration      04/02/2023,2359 Performed at [facility], [address] [place], [place] [address]   CBC     Status: Abnormal   Collection Time: 03/30/23  8:57 AM  Result Value Ref Range   WBC 9.7 4.0 - 10.5 K/uL   RBC 3.35 (L) 4.22 - 5.81 MIL/uL   Hemoglobin 10.3 (L) 13.0 - 17.0 g/dL   HCT 44.0 (L) 34.7 - 42.5 %   MCV 95.5 80.0 - 100.0 fL   MCH 30.7 26.0 - 34.0 pg   MCHC 32.2 30.0 - 36.0 g/dL   RDW 95.6 (H) 38.7 - 56.4 %   Platelets 256 150 - 400 K/uL   nRBC 0.0 0.0 -  0.2 %    Comment: Performed at [facility], [address] [place], [place] [address]  Comprehensive metabolic panel     Status: Abnormal   Collection Time: 03/30/23  8:57 AM  Result Value Ref Range   Sodium 136 135 - 145 mmol/L   Potassium 4.2 3.5 - 5.1 mmol/L   Chloride 103 98 - 111 mmol/L   CO2 22 22 - 32 mmol/L   Glucose, Bld 150 (H) 70 - 99 mg/dL    Comment: Glucose reference range applies only to samples taken after fasting for at least 8 hours.   BUN 21 8 - 23 mg/dL   Creatinine, Ser 6.43 0.61 - 1.24 mg/dL   Calcium 8.7 (L) 8.9 - 10.3 mg/dL   Total Protein 5.9 (L) 6.5 - 8.1 g/dL   Albumin 3.3 (L) 3.5 - 5.0 g/dL   AST 31 15 - 41 U/L   ALT 22 0 - 44 U/L   Alkaline Phosphatase 61 38 - 126 U/L   Total Bilirubin 0.7 0.0 - 1.2 mg/dL   GFR, Estimated 59 (L) >60 mL/min    Comment: (NOTE) Calculated using the CKD-EPI Creatinine  Equation (2021)    Anion gap 11 5 - 15    Comment: Performed at [facility], [address] [place], [place] [address]    DG Chest 1 View Result Date: 03/30/2023 CLINICAL DATA:  Status post fall.  Possible vomiting. EXAM: CHEST  1 VIEW COMPARISON:  Chest CT 09/12/2020. Radiographs 07/27/2019 and 10/09/2017. FINDINGS: 0914 hours. Evaluation of the left lung base is limited by what appears to be the patient's overlying left hand. The heart size and mediastinal contours are stable for technique. There is mild aortic atherosclerosis and a hiatal hernia. Chronic central airway thickening and scarring at both lung bases without definite superimposed airspace disease, pneumothorax or significant pleural effusion. No acute osseous findings are evident. There is evidence of a chronic rotator cuff tear on the right. IMPRESSION: No definite acute cardiopulmonary process. Chronic central airway thickening and scarring at both lung bases. Limited evaluation of the left lung base due to the patient's overlapping hand. Electronically Signed   By: Carey Bullocks M.D.   On: 03/30/2023 10:17   DG Hip Unilat With Pelvis 2-3 Views Left Result Date: 03/30/2023 CLINICAL DATA:  Status post fall.  Pain. EXAM: DG HIP (WITH OR WITHOUT PELVIS) 2-3V LEFT COMPARISON:  None Available. FINDINGS: There is in acute, comminuted fracture deformity involving the intertrochanteric portions of the proximal left femur. There is medial angulation of the distal fracture fragments. No dislocation. Mild degenerative changes noted within both hips. Signs of penile prosthesis. IMPRESSION: Acute, comminuted intertrochanteric fracture deformity of the proximal left femur. Electronically Signed   By: Signa Kell M.D.   On: 03/30/2023 10:13   CT Cervical Spine Wo Contrast Result Date: 03/30/2023 CLINICAL DATA:  Neck trauma.  Head trauma. EXAM: CT HEAD WITHOUT CONTRAST CT CERVICAL SPINE WITHOUT CONTRAST TECHNIQUE: Multidetector CT imaging of  the head and cervical spine was performed following the standard protocol without intravenous contrast. Multiplanar CT image reconstructions of the cervical spine were also generated. RADIATION DOSE REDUCTION: This exam was performed according to the departmental dose-optimization program which includes automated exposure control, adjustment of the mA and/or kV according to patient size and/or use of iterative reconstruction technique. 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 Small areas of cortical encephalomalacia  in the left inferior temporal and low right parietal cortex. Low-density in the cerebral white matter attributed to chronic small vessel ischemia. No evidence of acute hemorrhage, hydrocephalus, mass, or collection. Vascular: No hyperdense vessel or unexpected calcification. Skull: Normal. Negative for fracture or focal lesion. Sinuses/Orbits: Negative CT CERVICAL SPINE FINDINGS Alignment: No traumatic malalignment Skull base and vertebrae: No acute fracture Soft tissues and spinal canal: No prevertebral fluid or swelling. No visible canal hematoma. Disc levels:  Generalized degenerative endplate and facet spurring. Upper chest: Emphysema. IMPRESSION: 1. No evidence of acute intracranial or cervical spine injury. 2. 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 Electronically Signed   By: Tiburcio Pea M.D.   On: 03/30/2023 10:12   CT Head Wo Contrast Result Date: 03/30/2023 CLINICAL DATA:  Neck trauma.  Head trauma. EXAM: CT HEAD WITHOUT CONTRAST CT CERVICAL SPINE WITHOUT CONTRAST TECHNIQUE: Multidetector CT imaging of the head and cervical spine was performed following the standard protocol without intravenous contrast. Multiplanar CT image reconstructions of the cervical spine were also generated. RADIATION DOSE REDUCTION: This exam was performed  according to the departmental dose-optimization program which includes automated exposure control, adjustment of the mA and/or kV according to patient size and/or use of iterative reconstruction technique. 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 Small areas of cortical encephalomalacia in the left inferior temporal and low right parietal cortex. Low-density in the cerebral white matter attributed to chronic small vessel ischemia. No evidence of acute hemorrhage, hydrocephalus, mass, or collection. Vascular: No hyperdense vessel or unexpected calcification. Skull: Normal. Negative for fracture or focal lesion. Sinuses/Orbits: Negative CT CERVICAL SPINE FINDINGS Alignment: No traumatic malalignment Skull base and vertebrae: No acute fracture Soft tissues and spinal canal: No prevertebral fluid or swelling. No visible canal hematoma. Disc levels:  Generalized degenerative endplate and facet spurring. Upper chest: Emphysema. IMPRESSION: 1. No evidence of acute intracranial or cervical spine injury. 2. 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 Electronically Signed   By: Tiburcio Pea M.D.   On: 03/30/2023 10:12    Review of Systems  Constitutional:  Negative for chills and fever.  HENT:  Negative for congestion.   Respiratory:  Negative for cough and shortness of breath.   Cardiovascular:  Negative for chest pain.  Gastrointestinal:  Negative for abdominal pain, diarrhea and vomiting.  Genitourinary:  Negative for difficulty urinating.  Musculoskeletal:  Positive for arthralgias and myalgias.  Neurological:  Negative for dizziness and numbness.  Psychiatric/Behavioral:  Negative for confusion.    Blood pressure 127/66, pulse 63, temperature (!) 97.4 F (36.3 C), temperature source Oral, resp. rate 15, height 5\' 5"  (1.651 m), weight 63.5 kg, SpO2  94%. Physical Exam Constitutional:      General: He is not in acute distress.    Appearance: Normal appearance. He is normal weight.  HENT:     Head: Normocephalic and atraumatic.  Eyes:     Extraocular Movements: Extraocular movements intact.  Cardiovascular:     Rate and Rhythm: Normal rate.     Pulses: Normal pulses.  Pulmonary:     Effort: Pulmonary effort is normal.  Musculoskeletal:     Left hip: Tenderness present. Decreased range of motion.     Comments: Moderate swelling about the left hip. Tender to palpation about the left hip. No open wounds or abrasions about the LLE.  Calves soft and nontender. Distally neurovascularly intact in LE. Able to dorsiflex and plantar flex BLE. Skin tear on lateral left arm, dressing in place.   Skin:    Capillary Refill: Capillary refill takes less than 2 seconds.  Neurological:     General: No focal deficit present.     Mental Status: He is alert. Mental status is at baseline.  Psychiatric:        Mood and Affect: Mood normal.        Behavior: Behavior normal.     Assessment/Plan: Left intertrochanteric hip fracture Plan for left hip IM nail tomorrow. Patient to be NPO after midnight but may eat today. Medicine will admit patient. NWB at this time. Discussed the case with the patient and his wife including expected postoperative course, risks, and benefits. Dr Ave Filter posted case for tomorrow at [facility] L. [place], PA-C 03/30/2023, 1:19 PM

## 2023-03-30 NOTE — Consult Note (Signed)
 Cardiology Consultation   Patient ID: Cory Gonzalez MRN: [id number]; DOB: 1929/12/06  Admit date: 03/30/2023 Date of Consult: 03/30/2023  PCP:  [facility]   [facility] Providers Cardiologist:  Cory Si, MD       Patient Profile:   Cory Gonzalez is a 89 y.o. male with a hx of atrial fibrillation on Xarelto, chronic diastolic heart failure, hypertension, rheumatoid arthritis who is being seen 03/30/2023 for preop evaluation at the request of Dr. Alvino Chapel.  History of Present Illness:   Cory Gonzalez is a 89 year old male with above medical history.  Patient was followed by Dr. Duke Salvia in the past, has not been seen by cardiologist since 2020.  Patient was first diagnosed with atrial fibrillation in 03/2015 by his PCP.  Heart rate was well-controlled at 56 bpm at that time.  He was referred to cardiology and establish care with Dr. Duke Salvia.  He was started on Xarelto.  Underwent nuclear stress test in 04/2015 that was a normal, low risk study.  Echocardiogram in 04/2015 showed EF 60-65%, no regional wall motion abnormalities, grade 1 DD.  He was never started on AV nodal medications for his A-fib as he had a history of bradycardia.  Heart rate was overall well-controlled without of rate or rhythm control medications.  Patient was later seen by infectious disease in 06/2019 for fever of unknown origin.  He underwent echocardiogram in 07/2019 that showed EF 70%, no wall motion abnormalities, moderate asymmetric LVH, grade 1 DD, normal RV function, mild AS.  Patient presented to the ED on 3/1 after he had a fall at home.  His wife reported that patient was trying to fix the ice machine, lost his balance, and fell backward.  X-ray showed an acute, comminuted intertrochanteric fracture of the left femur.  Orthopedic surgery was consulted and patient is scheduled for left hip IM nail tomorrow.  Lab work in the ED significant for K4.2, creatinine 1.17, WBC 9.7, hemoglobin 10.3,  platelets 256.  Chest x-ray showed no definite acute cardiopulmonary process.EKG in the ED showed atrial fibrillation with heart rate 74 bpm.  On interview, patient is sleeping after receiving pain medications. His wife is at bedside. Reports that patient has not been seen by cardiology in a few years because patient did not want to go, and he felt that he was doing well from a cardiac standpoint. He walks around his home with a walker, and does not develop shortness of breath on exertion. Wife reports that patient does not complain of chest pain. No episodes of syncope, near syncope, dizziness. He has lower extremity swelling that improves if he elevates his feet. Has had a cardiac murmur for several years. Today when he was in the ED, he was laying flat when his HR decreased from the 50s to 33 BPM. HR was only low for a few seconds. The RN told him to cough which he did. He produced some sputum, and his HR jumped up to 66. HR has since been stable in the 50s-60s   Past Medical History:  Diagnosis Date   Chronic diastolic heart failure (HCC)    Dysrhythmia    A-fib   FUO (fever of unknown origin) 07/27/2019   Heart murmur    since childhood, never has had any problems   Hypertension    Paroxysmal atrial fibrillation (HCC)    Pneumonia    PONV (postoperative nausea and vomiting)    a little nausea   Rheumatoid arthritis (HCC)  Tick bite 07/27/2019    Past Surgical History:  Procedure Laterality Date   CATARACT EXTRACTION Bilateral    COLONOSCOPY     EXCISION MASS HEAD Left 03/29/2022   Procedure: EXCISION OF EAR MOH'S DEFECT;  Surgeon: Scarlette Ar, MD;  Location: [facility] OR;  Service: ENT;  Laterality: Left;   NASAL FLAP ROTATION Left 03/29/2022   Procedure: POSSIBLE FLAP ROTATION; POSSIBLE EAR CARTILAGE GRAFT;  Surgeon: Scarlette Ar, MD;  Location: [facility] OR;  Service: ENT;  Laterality: Left;   ORIF ELBOW FRACTURE Right 09/18/2017   Procedure: OPEN REDUCTION INTERNAL FIXATION (ORIF) RIGHT  ELBOW/OLECRANON FRACTURE;  Surgeon: Eldred Manges, MD;  Location: [facility] OR;  Service: Orthopedics;  Laterality: Right;   PROSTATE SURGERY     SKIN FULL THICKNESS GRAFT Left 03/29/2022   Procedure: ADJACENT TISSUE TRANSFER;  SKIN GRAFT;  Surgeon: Scarlette Ar, MD;  Location: [facility] OR;  Service: ENT;  Laterality: Left;     Home Medications:  Prior to Admission medications   Medication Sig Start Date End Date Taking? Authorizing Provider  acetaminophen (TYLENOL) 500 MG tablet Take 1,000 mg by mouth every 6 (six) hours as needed for moderate pain.   Yes [provider]  amLODipine (NORVASC) 10 MG tablet Take 10 mg by mouth daily.  08/06/11  Yes [provider]  finasteride (PROSCAR) 5 MG tablet Take 5 mg by mouth every evening.   Yes [provider]  folic acid (FOLVITE) 1 MG tablet Take 3 mg by mouth daily.    Yes [provider]  gabapentin (NEURONTIN) 100 MG capsule Take 200 mg by mouth at bedtime.   Yes [provider]  InFLIXimab (REMICADE IV) Inject into the vein every 8 (eight) weeks.   Yes [provider]  latanoprost (XALATAN) 0.005 % ophthalmic solution Place 1 drop into both eyes at bedtime.  11/01/14  Yes [provider]  lisinopril-hydrochlorothiazide (PRINZIDE,ZESTORETIC) 20-12.5 MG tablet Take 1 tablet by mouth daily. 11/03/15  Yes [provider]  methotrexate (RHEUMATREX) 2.5 MG tablet Take 10 mg by mouth every Friday.  10/06/14  Yes [provider]  Multiple Vitamins-Minerals (CENTRUM SILVER 50+MEN) TABS Take 1 tablet by mouth daily.   Yes [provider]  predniSONE (DELTASONE) 5 MG tablet Take 5 mg by mouth daily before breakfast.   Yes [provider]  rivaroxaban (XARELTO) 15 MG TABS tablet Take 1 tablet (15 mg total) by mouth daily with supper. Patient taking differently: Take 15 mg by mouth daily after supper. 09/17/17  Yes Cory Si, MD  tamsulosin (FLOMAX) 0.4 MG CAPS capsule  Take 0.4 mg by mouth every evening.   Yes [provider]    Inpatient Medications: Scheduled Meds:  [START ON 03/31/2023] amLODipine  10 mg Oral Daily   enoxaparin (LOVENOX) injection  40 mg Subcutaneous Q24H   [START ON 03/31/2023] folic acid  3 mg Oral Daily   [START ON 03/31/2023] hydrochlorothiazide  12.5 mg Oral Daily   latanoprost  1 drop Both Eyes QHS   [START ON 03/31/2023] lisinopril  20 mg Oral Daily   mupirocin ointment  1 Application Nasal BID   [START ON 03/31/2023] predniSONE  5 mg Oral QAC breakfast   sodium chloride flush  3 mL Intravenous Q12H   Continuous Infusions:  PRN Meds: fentaNYL (SUBLIMAZE) injection, mouth rinse  Allergies:   No Known Allergies  Social History:   Social History   Socioeconomic History   Marital status: Married    Spouse name: Cory Gonzalez  Number of children: Not on file   Years of education: Not on file   Highest education level: Not on file  Occupational History   Not on file  Tobacco Use   Smoking status: Every Day    Types: Pipe   Smokeless tobacco: Former  Advertising account planner   Vaping status: Never Used  Substance and Sexual Activity   Alcohol use: No    Alcohol/week: 0.0 standard drinks of alcohol   Drug use: No   Sexual activity: Not Currently  Other Topics Concern   Not on file  Social History Narrative   Not on file   Social Drivers of Health   Financial Resource Strain: Not on file  Food Insecurity: No Food Insecurity (03/30/2023)   Hunger Vital Sign    Worried About Running Out of Food in the Last Year: Never true    Ran Out of Food in the Last Year: Never true  Transportation Needs: Unmet Transportation Needs (03/30/2023)   PRAPARE - Administrator, Civil Service (Medical): Yes    Lack of Transportation (Non-Medical): Yes  Physical Activity: Not on file  Stress: Not on file  Social Connections: Moderately Integrated (03/30/2023)   Social Connection and Isolation Panel [NHANES]    Frequency of Communication  with Friends and Family: More than three times a week    Frequency of Social Gatherings with Friends and Family: Once a week    Attends Religious Services: Never    Database administrator or Organizations: No    Attends Engineer, structural: 1 to 4 times per year    Marital Status: Married  Catering manager Violence: Not At Risk (03/30/2023)   Humiliation, Afraid, Rape, and Kick questionnaire    Fear of Current or Ex-Partner: No    Emotionally Abused: No    Physically Abused: No    Sexually Abused: No    Family History:    Family History  Problem Relation Age of Onset   COPD Sister    Dementia Sister    Aneurysm Brother      ROS:  Please see the history of present illness.   All other ROS reviewed and negative.     Physical Exam/Data:   Vitals:   03/30/23 1315 03/30/23 1420 03/30/23 1421 03/30/23 1447  BP:  135/80  122/81  Pulse: 63 75  86  Resp: 15 15  18   Temp:   98.2 F (36.8 C) 97.8 F (36.6 C)  TempSrc:   Oral Oral  SpO2: 94% 91%  93%  Weight:    68.9 kg  Height:    5\' 5"  (1.651 m)    Intake/Output Summary (Last 24 hours) at 03/30/2023 1614 Last data filed at 03/30/2023 1609 Gross per 24 hour  Intake 1000 ml  Output 300 ml  Net 700 ml      03/30/2023    2:47 PM 03/30/2023    8:48 AM 03/29/2022   10:00 AM  Last 3 Weights  Weight (lbs) 151 lb 14.4 oz 140 lb 140 lb  Weight (kg) 68.9 kg 63.504 kg 63.504 kg     Body mass index is 25.28 kg/m.  General:  Elderly male. Laying flat in the bed in no acute distress. Sleeping, wakes up to voice.  HEENT: normal Neck: no JVD Vascular: No carotid bruits  Cardiac:  normal S1, S2; irregular rate and rhyth; grade 2/6 systolic murmur that radiated into the carotids  Lungs:  clear to auscultation bilaterally, no wheezing, rhonchi  or rales. Normal Wob on room air  Abd: soft, nontender Ext: mild edema in BLE  Skin: warm and dry  Neuro:  no focal abnormalities noted Psych:  Normal affect   EKG:  The EKG was  personally reviewed and demonstrates:  EKG in the ED showed atrial fibrillation with heart rate 74 bpm. Telemetry:  Telemetry was personally reviewed and demonstrates:  Atrial fibrillation with HR in the 50s-60s. One very brief drop to the 30s, with immediate improvement to the 60s. Appears vasovagal   Relevant CV Studies:   Laboratory Data:  High Sensitivity Troponin:  No results for input(s): "TROPONINIHS" in the last 720 hours.   Chemistry Recent Labs  Lab 03/30/23 0857  NA 136  K 4.2  CL 103  CO2 22  GLUCOSE 150*  BUN 21  CREATININE 1.17  CALCIUM 8.7*  GFRNONAA 59*  ANIONGAP 11    Recent Labs  Lab 03/30/23 0857  PROT 5.9*  ALBUMIN 3.3*  AST 31  ALT 22  ALKPHOS 61  BILITOT 0.7   Lipids No results for input(s): "CHOL", "TRIG", "HDL", "LABVLDL", "LDLCALC", "CHOLHDL" in the last 168 hours.  Hematology Recent Labs  Lab 03/30/23 0857  WBC 9.7  RBC 3.35*  HGB 10.3*  HCT 32.0*  MCV 95.5  MCH 30.7  MCHC 32.2  RDW 19.4*  PLT 256   Thyroid No results for input(s): "TSH", "FREET4" in the last 168 hours.  BNPNo results for input(s): "BNP", "PROBNP" in the last 168 hours.  DDimer No results for input(s): "DDIMER" in the last 168 hours.   Radiology/Studies:  DG Chest 1 View Result Date: 03/30/2023 CLINICAL DATA:  Status post fall.  Possible vomiting. EXAM: CHEST  1 VIEW COMPARISON:  Chest CT 09/12/2020. Radiographs 07/27/2019 and 10/09/2017. FINDINGS: 0914 hours. Evaluation of the left lung base is limited by what appears to be the patient's overlying left hand. The heart size and mediastinal contours are stable for technique. There is mild aortic atherosclerosis and a hiatal hernia. Chronic central airway thickening and scarring at both lung bases without definite superimposed airspace disease, pneumothorax or significant pleural effusion. No acute osseous findings are evident. There is evidence of a chronic rotator cuff tear on the right. IMPRESSION: No definite acute  cardiopulmonary process. Chronic central airway thickening and scarring at both lung bases. Limited evaluation of the left lung base due to the patient's overlapping hand. Electronically Signed   By: Carey Bullocks M.D.   On: 03/30/2023 10:17   DG Hip Unilat With Pelvis 2-3 Views Left Result Date: 03/30/2023 CLINICAL DATA:  Status post fall.  Pain. EXAM: DG HIP (WITH OR WITHOUT PELVIS) 2-3V LEFT COMPARISON:  None Available. FINDINGS: There is in acute, comminuted fracture deformity involving the intertrochanteric portions of the proximal left femur. There is medial angulation of the distal fracture fragments. No dislocation. Mild degenerative changes noted within both hips. Signs of penile prosthesis. IMPRESSION: Acute, comminuted intertrochanteric fracture deformity of the proximal left femur. Electronically Signed   By: Signa Kell M.D.   On: 03/30/2023 10:13   CT Cervical Spine Wo Contrast Result Date: 03/30/2023 CLINICAL DATA:  Neck trauma.  Head trauma. EXAM: CT HEAD WITHOUT CONTRAST CT CERVICAL SPINE WITHOUT CONTRAST TECHNIQUE: Multidetector CT imaging of the head and cervical spine was performed following the standard protocol without intravenous contrast. Multiplanar CT image reconstructions of the cervical spine were also generated. RADIATION DOSE REDUCTION: This exam was performed according to the departmental dose-optimization program which includes automated exposure control, adjustment  of the mA and/or kV according to patient size and/or use of iterative reconstruction technique. 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 Small areas of cortical encephalomalacia in the left inferior temporal and low right parietal cortex. Low-density in the cerebral white matter attributed to chronic small vessel ischemia. No evidence of acute hemorrhage, hydrocephalus, mass, or collection. Vascular: No hyperdense  vessel or unexpected calcification. Skull: Normal. Negative for fracture or focal lesion. Sinuses/Orbits: Negative CT CERVICAL SPINE FINDINGS Alignment: No traumatic malalignment Skull base and vertebrae: No acute fracture Soft tissues and spinal canal: No prevertebral fluid or swelling. No visible canal hematoma. Disc levels:  Generalized degenerative endplate and facet spurring. Upper chest: Emphysema. IMPRESSION: 1. No evidence of acute intracranial or cervical spine injury. 2. 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 Electronically Signed   By: Tiburcio Pea M.D.   On: 03/30/2023 10:12   CT Head Wo Contrast Result Date: 03/30/2023 CLINICAL DATA:  Neck trauma.  Head trauma. EXAM: CT HEAD WITHOUT CONTRAST CT CERVICAL SPINE WITHOUT CONTRAST TECHNIQUE: Multidetector CT imaging of the head and cervical spine was performed following the standard protocol without intravenous contrast. Multiplanar CT image reconstructions of the cervical spine were also generated. RADIATION DOSE REDUCTION: This exam was performed according to the departmental dose-optimization program which includes automated exposure control, adjustment of the mA and/or kV according to patient size and/or use of iterative reconstruction technique. 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 Small areas of cortical encephalomalacia in the left inferior temporal and low right parietal cortex. Low-density in the cerebral white matter attributed to chronic small vessel ischemia. No evidence of acute hemorrhage, hydrocephalus, mass, or collection. Vascular: No hyperdense vessel or unexpected calcification. Skull: Normal. Negative for fracture or focal lesion. Sinuses/Orbits: Negative CT CERVICAL SPINE FINDINGS Alignment: No traumatic malalignment Skull base and vertebrae: No acute fracture Soft tissues and  spinal canal: No prevertebral fluid or swelling. No visible canal hematoma. Disc levels:  Generalized degenerative endplate and facet spurring. Upper chest: Emphysema. IMPRESSION: 1. No evidence of acute intracranial or cervical spine injury. 2. 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 Electronically Signed   By: Tiburcio Pea M.D.   On: 03/30/2023 10:12     Assessment and Plan:   Preoperative Evaluation  - Patient currently admitted with a left hip fracture after a mechanical fall at home. No syncope.  - He has a history of atrial fibrillation- HR has been well controlled without use of rate controlling medications. He is on xarelto  - Echo from 07/2019 showed EF 70% with no regional wall motion abnormalities, grade I DD, normal RV function, mild AS - No recent chest pain, DOE, syncope, near syncope, palpitations.  - Given patient's advanced age, he does have a higher risk of perioperative complications. From a cardiac standpoint, his HR is well controlled when in afib and he denies cardiac complaints. Suspect that the benefits of surgery outweigh the risks, I do not anticipate further cardiac workup prior to surgery.  - Dr. Rennis Golden to see to finalize preop recommendations  - OK to hold xarelto as needed perioperatively   Atrial Fibrillation  - Suspect afib is at lease persistent at this point. First diagnosed in 2017 - HR well controlled without use of rate  controlling medications - In the ED, patient had a very brief decrease in HR to the 30s. He coughed, and HR immediately improved to the 60s. He has not had bradycardia since then. Possible vasovagal episode - Continue to monitor on telemetry  - As above, ok to hold xarelto prior to surgery   Cardiac Murmur  Aortic Stenosis  - Patient has reportedly had a cardiac murmur for several years  - Echo from 2021 showed mild AS - He continues to have murmur on exam today. No symptoms to suggest  worsening AS  Otherwise per primary  - Left hip fracture  - Rheumatoid arthritis  - CKD stage IIIa    Risk Assessment/Risk Scores:   CHA2DS2-VASc Score = 3  This indicates a 3.2% annual risk of stroke. The patient's score is based upon: CHF History: 0 HTN History: 1 Diabetes History: 0 Stroke History: 0 Vascular Disease History: 0 Age Score: 2 Gender Score: 0    For questions or updates, please contact [facility] Please consult [email or web address] for contact info under    Signed, Jonita Albee, PA-C  03/30/2023 4:14 PM

## 2023-03-30 NOTE — ED Notes (Signed)
Patient transported to XRAY 

## 2023-03-30 NOTE — H&P (Addendum)
 History and Physical    Cory Gonzalez WUJ:[id number] DOB: 1929-06-05 DOA: 03/30/2023  PCP: [facility]  Patient coming from: Home  Chief Complaint: Left hip pain after fall   HPI: Cory Gonzalez is a 89 y.o. male with medical history significant of A-fib on Xarelto, chronic diastolic heart failure, hypertension, rheumatoid arthritis who presents after a fall at home.  Wife is at bedside who gives history.  She states that patient was trying to fix the ice machine, lost balance and fell backward.  EMS was called.  Otherwise has been in good health recently.  States that the morphine received in the ER did not help much with pain.  Believes his last Xarelto dose was Thursday 2/27.  ED Course: Hip x-ray revealed acute comminuted intertrochanteric fracture of left femur.  Orthopedic surgery consulted.  Review of Systems: As per HPI. Otherwise, all other review of systems reviewed and are negative.   Past Medical History:  Diagnosis Date   Chronic diastolic heart failure (HCC)    Dysrhythmia    A-fib   FUO (fever of unknown origin) 07/27/2019   Heart murmur    since childhood, never has had any problems   Hypertension    Paroxysmal atrial fibrillation (HCC)    Pneumonia    PONV (postoperative nausea and vomiting)    a little nausea   Rheumatoid arthritis (HCC)    Tick bite 07/27/2019    Past Surgical History:  Procedure Laterality Date   CATARACT EXTRACTION Bilateral    COLONOSCOPY     EXCISION MASS HEAD Left 03/29/2022   Procedure: EXCISION OF EAR MOH'S DEFECT;  Surgeon: Scarlette Ar, MD;  Location: [facility] OR;  Service: ENT;  Laterality: Left;   NASAL FLAP ROTATION Left 03/29/2022   Procedure: POSSIBLE FLAP ROTATION; POSSIBLE EAR CARTILAGE GRAFT;  Surgeon: Scarlette Ar, MD;  Location: [facility] OR;  Service: ENT;  Laterality: Left;   ORIF ELBOW FRACTURE Right 09/18/2017   Procedure: OPEN REDUCTION INTERNAL FIXATION (ORIF) RIGHT ELBOW/OLECRANON FRACTURE;  Surgeon: Eldred Manges, MD;  Location: [facility] OR;  Service: Orthopedics;  Laterality: Right;   PROSTATE SURGERY     SKIN FULL THICKNESS GRAFT Left 03/29/2022   Procedure: ADJACENT TISSUE TRANSFER;  SKIN GRAFT;  Surgeon: Scarlette Ar, MD;  Location: [facility];  Service: ENT;  Laterality: Left;     reports that he has been smoking pipe. He has quit using smokeless tobacco. He reports that he does not drink alcohol and does not use drugs.  No Known Allergies  Family History  Problem Relation Age of Onset   COPD Sister    Dementia Sister    Aneurysm Brother     Prior to Admission medications   Medication Sig Start Date End Date Taking? Authorizing Provider  acetaminophen (TYLENOL) 500 MG tablet Take 1,000 mg by mouth every 6 (six) hours as needed for moderate pain.    [provider]  amLODipine (NORVASC) 10 MG tablet Take 10 mg by mouth daily.  08/06/11   [provider]  folic acid (FOLVITE) 1 MG tablet Take 3 mg by mouth daily.     [provider]  InFLIXimab (REMICADE IV) Inject into the vein every 8 (eight) weeks.    [provider]  latanoprost (XALATAN) 0.005 % ophthalmic solution Place 1 drop into both eyes at bedtime.  11/01/14   [provider]  lisinopril-hydrochlorothiazide (PRINZIDE,ZESTORETIC) 20-12.5 MG tablet Take 1 tablet by mouth daily. 11/03/15   [provider]  methotrexate (RHEUMATREX)  2.5 MG tablet Take 10 mg by mouth every Friday.  10/06/14   [provider]  Multiple Vitamin (MULITIVITAMIN WITH MINERALS) TABS Take 1 tablet by mouth daily. Centrum Silver    [provider]  predniSONE (DELTASONE) 5 MG tablet Take 5 mg by mouth daily with breakfast.    [provider]  rivaroxaban (XARELTO) 15 MG TABS tablet Take 1 tablet (15 mg total) by mouth daily with supper. 09/17/17   Chilton Si, MD  tamsulosin (FLOMAX) 0.4 MG CAPS capsule Take 0.4 mg by mouth 1 day or 1 dose.    [provider]    Physical  Exam: Vitals:   03/30/23 4098 03/30/23 0848 03/30/23 0850 03/30/23 0905  BP:   127/66   Pulse: 68     Temp:   (!) 97.4 F (36.3 C)   TempSrc:   Oral   SpO2:   98% 96%  Weight:  63.5 kg    Height:  5\' 5"  (1.651 m)      Constitutional: NAD, calm, comfortable Eyes: PERRL, lids and conjunctivae normal Respiratory: Clear to auscultation bilaterally, no wheezing, no crackles. Normal respiratory effort. No accessory muscle use. No conversational dyspnea on room air Cardiovascular: Irregular rhythm, rate 60s, +murmur  Abdomen: Soft, nondistended, nontender to palpation.  Musculoskeletal:  No contractures. Normal muscle tone.  Skin: Skin abrasion left upper arm Neurologic: Alert and oriented Psychiatric: Normal mood and affect   Labs on Admission: I have personally reviewed following labs and imaging studies  CBC: Recent Labs  Lab 03/30/23 0857  WBC 9.7  HGB 10.3*  HCT 32.0*  MCV 95.5  PLT 256   Basic Metabolic Panel: Recent Labs  Lab 03/30/23 0857  NA 136  K 4.2  CL 103  CO2 22  GLUCOSE 150*  BUN 21  CREATININE 1.17  CALCIUM 8.7*   GFR: Estimated Creatinine Clearance: 35.8 mL/min (by C-G formula based on SCr of 1.17 mg/dL). Liver Function Tests: Recent Labs  Lab 03/30/23 0857  AST 31  ALT 22  ALKPHOS 61  BILITOT 0.7  PROT 5.9*  ALBUMIN 3.3*   No results for input(s): "LIPASE", "AMYLASE" in the last 168 hours. No results for input(s): "AMMONIA" in the last 168 hours. Coagulation Profile: No results for input(s): "INR", "PROTIME" in the last 168 hours. Cardiac Enzymes: No results for input(s): "CKTOTAL", "CKMB", "CKMBINDEX", "TROPONINI" in the last 168 hours. BNP (last 3 results) No results for input(s): "PROBNP" in the last 8760 hours. HbA1C: No results for input(s): "HGBA1C" in the last 72 hours. CBG: No results for input(s): "GLUCAP" in the last 168 hours. Lipid Profile: No results for input(s): "CHOL", "HDL", "LDLCALC", "TRIG", "CHOLHDL",  "LDLDIRECT" in the last 72 hours. Thyroid Function Tests: No results for input(s): "TSH", "T4TOTAL", "FREET4", "T3FREE", "THYROIDAB" in the last 72 hours. Anemia Panel: No results for input(s): "VITAMINB12", "FOLATE", "FERRITIN", "TIBC", "IRON", "RETICCTPCT" in the last 72 hours. Urine analysis:    Component Value Date/Time   COLORURINE YELLOW 12/07/2016 1326   APPEARANCEUR CLEAR 12/07/2016 1326   LABSPEC <1.005 (L) 12/07/2016 1326   PHURINE 6.5 12/07/2016 1326   GLUCOSEU NEGATIVE 12/07/2016 1326   HGBUR NEGATIVE 12/07/2016 1326   BILIRUBINUR NEGATIVE 12/07/2016 1326   KETONESUR NEGATIVE 12/07/2016 1326   PROTEINUR NEGATIVE 12/07/2016 1326   NITRITE NEGATIVE 12/07/2016 1326   LEUKOCYTESUR NEGATIVE 12/07/2016 1326   Sepsis Labs: !!!!!!!!!!!!!!!!!!!!!!!!!!!!!!!!!!!!!!!!!!!! @LABRCNTIP (procalcitonin:4,lacticidven:4) )No results found for this or any previous visit (from the past 240 hours).   Radiological Exams on Admission:  DG Chest 1 View Result Date: 03/30/2023 CLINICAL DATA:  Status post fall.  Possible vomiting. EXAM: CHEST  1 VIEW COMPARISON:  Chest CT 09/12/2020. Radiographs 07/27/2019 and 10/09/2017. FINDINGS: 0914 hours. Evaluation of the left lung base is limited by what appears to be the patient's overlying left hand. The heart size and mediastinal contours are stable for technique. There is mild aortic atherosclerosis and a hiatal hernia. Chronic central airway thickening and scarring at both lung bases without definite superimposed airspace disease, pneumothorax or significant pleural effusion. No acute osseous findings are evident. There is evidence of a chronic rotator cuff tear on the right. IMPRESSION: No definite acute cardiopulmonary process. Chronic central airway thickening and scarring at both lung bases. Limited evaluation of the left lung base due to the patient's overlapping hand. Electronically Signed   By: Carey Bullocks M.D.   On: 03/30/2023 10:17   DG Hip Unilat  With Pelvis 2-3 Views Left Result Date: 03/30/2023 CLINICAL DATA:  Status post fall.  Pain. EXAM: DG HIP (WITH OR WITHOUT PELVIS) 2-3V LEFT COMPARISON:  None Available. FINDINGS: There is in acute, comminuted fracture deformity involving the intertrochanteric portions of the proximal left femur. There is medial angulation of the distal fracture fragments. No dislocation. Mild degenerative changes noted within both hips. Signs of penile prosthesis. IMPRESSION: Acute, comminuted intertrochanteric fracture deformity of the proximal left femur. Electronically Signed   By: Signa Kell M.D.   On: 03/30/2023 10:13   CT Cervical Spine Wo Contrast Result Date: 03/30/2023 CLINICAL DATA:  Neck trauma.  Head trauma. EXAM: CT HEAD WITHOUT CONTRAST CT CERVICAL SPINE WITHOUT CONTRAST TECHNIQUE: Multidetector CT imaging of the head and cervical spine was performed following the standard protocol without intravenous contrast. Multiplanar CT image reconstructions of the cervical spine were also generated. RADIATION DOSE REDUCTION: This exam was performed according to the departmental dose-optimization program which includes automated exposure control, adjustment of the mA and/or kV according to patient size and/or use of iterative reconstruction technique. 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 Small areas of cortical encephalomalacia in the left inferior temporal and low right parietal cortex. Low-density in the cerebral white matter attributed to chronic small vessel ischemia. No evidence of acute hemorrhage, hydrocephalus, mass, or collection. Vascular: No hyperdense vessel or unexpected calcification. Skull: Normal. Negative for fracture or focal lesion. Sinuses/Orbits: Negative CT CERVICAL SPINE FINDINGS Alignment: No traumatic malalignment Skull base and vertebrae: No acute fracture Soft tissues and spinal canal: No  prevertebral fluid or swelling. No visible canal hematoma. Disc levels:  Generalized degenerative endplate and facet spurring. Upper chest: Emphysema. IMPRESSION: 1. No evidence of acute intracranial or cervical spine injury. 2. 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 Electronically Signed   By: Tiburcio Pea M.D.   On: 03/30/2023 10:12   CT Head Wo Contrast Result Date: 03/30/2023 CLINICAL DATA:  Neck trauma.  Head trauma. EXAM: CT HEAD WITHOUT CONTRAST CT CERVICAL SPINE WITHOUT CONTRAST TECHNIQUE: Multidetector CT imaging of the head and cervical spine was performed following the standard protocol without intravenous contrast. Multiplanar CT image reconstructions of the cervical spine were also generated. RADIATION DOSE REDUCTION: This exam was performed according to the departmental dose-optimization program which includes automated exposure control, adjustment of the mA and/or kV according to patient size and/or use of iterative reconstruction technique. COMPARISON:  Head CT 03/13/2022 FINDINGS: CT HEAD FINDINGS Brain: Chronic  but interval small infarct in the superior right frontal cortex. Small areas of cortical encephalomalacia in the left inferior temporal and low right parietal cortex. Low-density in the cerebral white matter attributed to chronic small vessel ischemia. No evidence of acute hemorrhage, hydrocephalus, mass, or collection. Vascular: No hyperdense vessel or unexpected calcification. Skull: Normal. Negative for fracture or focal lesion. Sinuses/Orbits: Negative CT CERVICAL SPINE FINDINGS Alignment: No traumatic malalignment Skull base and vertebrae: No acute fracture Soft tissues and spinal canal: No prevertebral fluid or swelling. No visible canal hematoma. Disc levels:  Generalized degenerative endplate and facet spurring. Upper chest: Emphysema. IMPRESSION: 1. No evidence of acute intracranial or cervical spine injury. 2. Chronic but  interval infarct in the superior right frontal lobe when compared to 03/13/2022. Electronically Signed   By: Tiburcio Pea M.D.   On: 03/30/2023 10:12    Assessment/Plan Principal Problem:   Closed left hip fracture (HCC) Active Problems:   HTN (hypertension)   Rheumatoid arthritis (HCC)   Atrial fibrillation, chronic (HCC)   CKD stage 3a, GFR 45-59 ml/min (HCC)   BPH (benign prostatic hyperplasia)   Left hip fracture after fall at home -Orthopedic surgery consulted, planning for intramedullary nail fixation 3/2 -Xarelto on hold -Will need PT OT post-op  -Chales Abrahams Peri-op Cardiac Risk is 1.93% estimated risk probability for perioperative myocardial infarction or cardiac arrest  Chronic A-fib -Xarelto on hold -Addendum: After my evaluation, I was notified by RN that patient's heart rate dipped into the 30s.  This lasted about 30 seconds.  Patient was noted to be less responsive during the episode.  It resolved spontaneously and his heart rate recovered into the 70s.  Patient is not on rate controlling medications.  Will get cardiology to consult.  Also notified orthopedic surgery team  Rheumatoid arthritis -Infliximab every 8 weeks, methotrexate every Friday (last dose 2/28), prednisone  CKD stage IIIa -Stable  Hypertension -Norvasc, lisinopril, HCTZ   DVT prophylaxis: Xarelto on hold, lovenox pre-op   Code Status: DNR, confirmed with spouse at bedside Family Communication: At bedside Disposition Plan: Likely SNF postoperatively Consults called: Orthopedic surgery, cardiology  Severity of Illness: The appropriate patient status for this patient is INPATIENT. Inpatient status is judged to be reasonable and necessary in order to provide the required intensity of service to ensure the patient's safety. The patient's presenting symptoms, physical exam findings, and initial radiographic and laboratory data in the context of their chronic comorbidities is felt to place them at high  risk for further clinical deterioration. Furthermore, it is not anticipated that the patient will be medically stable for discharge from the hospital within 2 midnights of admission.   * I certify that at the point of admission it is my clinical judgment that the patient will require inpatient hospital care spanning beyond 2 midnights from the point of admission due to high intensity of service, high risk for further deterioration and high frequency of surveillance required.Noralee Stain, DO [facility] 03/30/2023, 1:09 PM   Available via Epic secure chat 7am-7pm After these hours, please refer to coverage provider listed on [email or web address]

## 2023-03-30 NOTE — ED Provider Notes (Addendum)
  EMERGENCY DEPARTMENT AT [facility] Provider Note   CSN: [id number] Arrival date & time: 03/30/23  0830     History  Chief Complaint  Patient presents with   Cory Gonzalez    Cory Gonzalez is a 89 y.o. male.  Pt on xarelto s/p fall at home today. By report, slipped on ice/was fixing icemaker, and fell, hit head, and c/o left hip pain. No loc. Pt limited historian - level 5 caveat.  No headache. No neck or back pain. Left hip pain, constant, dull. Last tetanus ~ 5 yrs ago. No other pain or injury noted.   The history is provided by the patient, the spouse, the EMS personnel and medical records. The history is limited by the condition of the patient.  Fall Pertinent negatives include no chest pain, no abdominal pain, no headaches and no shortness of breath.       Home Medications Prior to Admission medications   Medication Sig Start Date End Date Taking? Authorizing Provider  acetaminophen (TYLENOL) 500 MG tablet Take 1,000 mg by mouth every 6 (six) hours as needed for moderate pain.   Yes [provider]  amLODipine (NORVASC) 10 MG tablet Take 10 mg by mouth daily.  08/06/11  Yes [provider]  folic acid (FOLVITE) 1 MG tablet Take 3 mg by mouth daily.    Yes [provider]  InFLIXimab (REMICADE IV) Inject into the vein every 8 (eight) weeks.   Yes [provider]  latanoprost (XALATAN) 0.005 % ophthalmic solution Place 1 drop into both eyes at bedtime.  11/01/14   [provider]  lisinopril-hydrochlorothiazide (PRINZIDE,ZESTORETIC) 20-12.5 MG tablet Take 1 tablet by mouth daily. 11/03/15   [provider]  methotrexate (RHEUMATREX) 2.5 MG tablet Take 10 mg by mouth every Friday.  10/06/14   [provider]  Multiple Vitamin (MULITIVITAMIN WITH MINERALS) TABS Take 1 tablet by mouth daily. Centrum Silver    [provider]  predniSONE (DELTASONE) 5 MG tablet Take 5 mg by mouth daily with breakfast.     [provider]  rivaroxaban (XARELTO) 15 MG TABS tablet Take 1 tablet (15 mg total) by mouth daily with supper. 09/17/17   Chilton Si, MD  tamsulosin (FLOMAX) 0.4 MG CAPS capsule Take 0.4 mg by mouth 1 day or 1 dose.    [provider]      Allergies    Patient has no known allergies.    Review of Systems   Review of Systems  Constitutional:  Negative for fever.  HENT:  Negative for nosebleeds.   Respiratory:  Negative for shortness of breath.   Cardiovascular:  Negative for chest pain.  Gastrointestinal:  Negative for abdominal pain and vomiting.  Musculoskeletal:  Negative for back pain and neck pain.  Skin:  Positive for wound.       Skin tear left upper arm  Neurological:  Negative for weakness, numbness and headaches.    Physical Exam Updated Vital Signs BP 127/66 (BP Location: Right Arm)   Pulse 68   Temp (!) 97.4 F (36.3 C) (Oral)   Ht 1.651 m (5\' 5" )   Wt 63.5 kg   SpO2 96%   BMI 23.30 kg/m  Physical Exam Vitals and nursing note reviewed.  Constitutional:      Appearance: Normal appearance. He is well-developed.  HENT:     Nose: Nose normal.     Mouth/Throat:     Mouth: Mucous membranes are moist.  Pharynx: Oropharynx is clear.  Eyes:     General: No scleral icterus.    Conjunctiva/sclera: Conjunctivae normal.     Pupils: Pupils are equal, round, and reactive to light.  Neck:     Vascular: No carotid bruit.     Trachea: No tracheal deviation.  Cardiovascular:     Rate and Rhythm: Normal rate and regular rhythm.     Pulses: Normal pulses.     Heart sounds: Normal heart sounds. No murmur heard.    No friction rub. No gallop.  Pulmonary:     Effort: Pulmonary effort is normal. No accessory muscle usage or respiratory distress.     Breath sounds: Normal breath sounds.  Chest:     Chest wall: No tenderness.  Abdominal:     General: Bowel sounds are normal. There is no distension.     Palpations: Abdomen is soft. There is no  mass.     Tenderness: There is no abdominal tenderness.  Genitourinary:    Comments: No cva tenderness. Musculoskeletal:        General: No swelling.     Cervical back: Normal range of motion and neck supple. No rigidity or tenderness.     Comments: LLE rotated and shortened. Pain left hip, no other focal pain or bony tenderness on rom bilateral extremities. Distal pulses palp bil.  Superficial skin tear to left upper arm.   Skin:    General: Skin is warm and dry.     Findings: No rash.  Neurological:     Mental Status: He is alert.     Comments: Alert, speech clear. Motor/sens grossly intact bil. GCS 15.      ED Results / Procedures / Treatments   Labs (all labs ordered are listed, but only abnormal results are displayed) Results for orders placed or performed during the hospital encounter of 03/30/23  Type and screen   Collection Time: 03/30/23  8:56 AM  Result Value Ref Range   ABO/RH(D) B POS    Antibody Screen NEG    Sample Expiration      04/02/2023,2359 Performed at [facility], [address] [place], [place] [address]   CBC   Collection Time: 03/30/23  8:57 AM  Result Value Ref Range   WBC 9.7 4.0 - 10.5 K/uL   RBC 3.35 (L) 4.22 - 5.81 MIL/uL   Hemoglobin 10.3 (L) 13.0 - 17.0 g/dL   HCT 84.1 (L) 32.4 - 40.1 %   MCV 95.5 80.0 - 100.0 fL   MCH 30.7 26.0 - 34.0 pg   MCHC 32.2 30.0 - 36.0 g/dL   RDW 02.7 (H) 25.3 - 66.4 %   Platelets 256 150 - 400 K/uL   nRBC 0.0 0.0 - 0.2 %  Comprehensive metabolic panel   Collection Time: 03/30/23  8:57 AM  Result Value Ref Range   Sodium 136 135 - 145 mmol/L   Potassium 4.2 3.5 - 5.1 mmol/L   Chloride 103 98 - 111 mmol/L   CO2 22 22 - 32 mmol/L   Glucose, Bld 150 (H) 70 - 99 mg/dL   BUN 21 8 - 23 mg/dL   Creatinine, Ser 4.03 0.61 - 1.24 mg/dL   Calcium 8.7 (L) 8.9 - 10.3 mg/dL   Total Protein 5.9 (L) 6.5 - 8.1 g/dL   Albumin 3.3 (L) 3.5 - 5.0 g/dL   AST 31 15 - 41 U/L   ALT 22 0 - 44 U/L   Alkaline Phosphatase  61 38 -  126 U/L   Total Bilirubin 0.7 0.0 - 1.2 mg/dL   GFR, Estimated 59 (L) >60 mL/min   Anion gap 11 5 - 15    EKG None  Radiology DG Chest 1 View Result Date: 03/30/2023 CLINICAL DATA:  Status post fall.  Possible vomiting. EXAM: CHEST  1 VIEW COMPARISON:  Chest CT 09/12/2020. Radiographs 07/27/2019 and 10/09/2017. FINDINGS: 0914 hours. Evaluation of the left lung base is limited by what appears to be the patient's overlying left hand. The heart size and mediastinal contours are stable for technique. There is mild aortic atherosclerosis and a hiatal hernia. Chronic central airway thickening and scarring at both lung bases without definite superimposed airspace disease, pneumothorax or significant pleural effusion. No acute osseous findings are evident. There is evidence of a chronic rotator cuff tear on the right. IMPRESSION: No definite acute cardiopulmonary process. Chronic central airway thickening and scarring at both lung bases. Limited evaluation of the left lung base due to the patient's overlapping hand. Electronically Signed   By: Carey Bullocks M.D.   On: 03/30/2023 10:17   DG Hip Unilat With Pelvis 2-3 Views Left Result Date: 03/30/2023 CLINICAL DATA:  Status post fall.  Pain. EXAM: DG HIP (WITH OR WITHOUT PELVIS) 2-3V LEFT COMPARISON:  None Available. FINDINGS: There is in acute, comminuted fracture deformity involving the intertrochanteric portions of the proximal left femur. There is medial angulation of the distal fracture fragments. No dislocation. Mild degenerative changes noted within both hips. Signs of penile prosthesis. IMPRESSION: Acute, comminuted intertrochanteric fracture deformity of the proximal left femur. Electronically Signed   By: Signa Kell M.D.   On: 03/30/2023 10:13   CT Cervical Spine Wo Contrast Result Date: 03/30/2023 CLINICAL DATA:  Neck trauma.  Head trauma. EXAM: CT HEAD WITHOUT CONTRAST CT CERVICAL SPINE WITHOUT CONTRAST TECHNIQUE: Multidetector CT  imaging of the head and cervical spine was performed following the standard protocol without intravenous contrast. Multiplanar CT image reconstructions of the cervical spine were also generated. RADIATION DOSE REDUCTION: This exam was performed according to the departmental dose-optimization program which includes automated exposure control, adjustment of the mA and/or kV according to patient size and/or use of iterative reconstruction technique. 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 Small areas of cortical encephalomalacia in the left inferior temporal and low right parietal cortex. Low-density in the cerebral white matter attributed to chronic small vessel ischemia. No evidence of acute hemorrhage, hydrocephalus, mass, or collection. Vascular: No hyperdense vessel or unexpected calcification. Skull: Normal. Negative for fracture or focal lesion. Sinuses/Orbits: Negative CT CERVICAL SPINE FINDINGS Alignment: No traumatic malalignment Skull base and vertebrae: No acute fracture Soft tissues and spinal canal: No prevertebral fluid or swelling. No visible canal hematoma. Disc levels:  Generalized degenerative endplate and facet spurring. Upper chest: Emphysema. IMPRESSION: 1. No evidence of acute intracranial or cervical spine injury. 2. 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 Electronically Signed   By: Tiburcio Pea M.D.   On: 03/30/2023 10:12   CT Head Wo Contrast Result Date: 03/30/2023 CLINICAL DATA:  Neck trauma.  Head trauma. EXAM: CT HEAD WITHOUT CONTRAST CT CERVICAL SPINE WITHOUT CONTRAST TECHNIQUE: Multidetector CT imaging of the head and cervical spine was performed following the standard protocol without intravenous contrast. Multiplanar CT image reconstructions of the cervical spine were also generated. RADIATION DOSE REDUCTION: This exam was  performed according to the departmental  dose-optimization program which includes automated exposure control, adjustment of the mA and/or kV according to patient size and/or use of iterative reconstruction technique. 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 Small areas of cortical encephalomalacia in the left inferior temporal and low right parietal cortex. Low-density in the cerebral white matter attributed to chronic small vessel ischemia. No evidence of acute hemorrhage, hydrocephalus, mass, or collection. Vascular: No hyperdense vessel or unexpected calcification. Skull: Normal. Negative for fracture or focal lesion. Sinuses/Orbits: Negative CT CERVICAL SPINE FINDINGS Alignment: No traumatic malalignment Skull base and vertebrae: No acute fracture Soft tissues and spinal canal: No prevertebral fluid or swelling. No visible canal hematoma. Disc levels:  Generalized degenerative endplate and facet spurring. Upper chest: Emphysema. IMPRESSION: 1. No evidence of acute intracranial or cervical spine injury. 2. 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 Electronically Signed   By: Tiburcio Pea M.D.   On: 03/30/2023 10:12    Procedures Procedures    Medications Ordered in ED Medications  lactated ringers bolus 1,000 mL (1,000 mLs Intravenous New Bag/Given 03/30/23 0941)  ondansetron (ZOFRAN) injection 4 mg (4 mg Intravenous Given 03/30/23 0937)  morphine (PF) 2 MG/ML injection 2 mg (2 mg Intravenous Given 03/30/23 1610)    ED Course/ Medical Decision Making/ A&P                                 Medical Decision Making Problems Addressed: Closed fracture of left hip, initial encounter [facility]): acute illness or injury with systemic symptoms that poses a threat to life or bodily functions Contusion of head, initial encounter: acute illness or injury with systemic  symptoms that poses a threat to life or bodily functions Current use of long term anticoagulation: chronic illness or injury that poses a threat to life or bodily functions Fall from slip, trip, or stumble, initial encounter: acute illness or injury with systemic symptoms that poses a threat to life or bodily functions Skin tear of left upper arm without complication, initial encounter: acute illness or injury  Amount and/or Complexity of Data Reviewed Independent Historian: spouse and EMS    Details: hx External Data Reviewed: notes. Labs: ordered. Decision-making details documented in ED Course. Radiology: ordered and independent interpretation performed. Decision-making details documented in ED Course. ECG/medicine tests: ordered and independent interpretation performed. Decision-making details documented in ED Course. Discussion of management or test interpretation with external provider(s): Orthopedics, medicine  Risk Prescription drug management. Parenteral controlled substances. Decision regarding hospitalization.   Iv ns. Continuous pulse ox and cardiac monitoring. Labs ordered/sent. Imaging ordered.   Differential diagnosis includes head injury/sdh, hip fracture, etc. Dispo decision including potential need for admission considered - will get labs and imaging and reassess.   Reviewed nursing notes and prior charts for additional history. External reports reviewed. Additional history from: EMS, family.   Cardiac monitor: sinus rhythm, rate 70  Morphine iv. Zofran iv. Ivf.   Labs reviewed/interpreted by me - wbc normal. Hct 32.   Xrays reviewed/interpreted by me - left hip IT fx.   CT reviewed/interpreted by me - no hem.   Recheck pain improved/controlled. LLE nvi, intact distal pulses.   Orthopedics consulted for hip fx - discussed pt, anticoag therapy, etc w Dr Ave Filter - they will see in consult.   Medicine consulted for admission.  Final  Clinical Impression(s) / ED Diagnoses Final diagnoses:  Fall from slip, trip, or stumble, initial encounter  Closed fracture of left hip, initial encounter (HCC)  Contusion of head, initial encounter  Skin tear of left upper arm without complication, initial encounter  Current use of long term anticoagulation    Rx / DC Orders ED Discharge Orders     None          Cathren Laine, MD 03/30/23 1151

## 2023-03-30 NOTE — ED Triage Notes (Signed)
 Pt BIB [facility] for fall at home, says he did not hit his head but is on Xarelto, left hip pain and rotation/shortening. Right eye bruising that he says he has had for a while. Evidence of vomit around mouth but pt says he did not throw up. Aox4.

## 2023-03-30 NOTE — ED Notes (Signed)
 Trauma Event Note    Non-activated trauma -- pt fell at home, has left hip pain. Also has bruising around right eye, skin tear to left upper arm. Wife states that he fell on Tuesday and thinks that his eye/face may have been injured then.  Primary RN at bedside.  Last imported Vital Signs BP 127/66 (BP Location: Right Arm)   Pulse 68   Temp (!) 97.4 F (36.3 C) (Oral)   Ht 5\' 5"  (1.651 m)   Wt 140 lb (63.5 kg)   SpO2 96%   BMI 23.30 kg/m   Trending CBC Recent Labs    03/30/23 0857  WBC 9.7  HGB 10.3*  HCT 32.0*  PLT 256    Trending Coag's No results for input(s): "APTT", "INR" in the last 72 hours.  Trending BMET Recent Labs    03/30/23 0857  NA 136  K 4.2  CL 103  CO2 22  BUN 21  CREATININE 1.17  GLUCOSE 150*      Vidya Bamford M Lexus Barletta  Trauma Response RN  Please call TRN at [phone] for further assistance.

## 2023-03-31 ENCOUNTER — Encounter (HOSPITAL_COMMUNITY)
Admission: EM | Disposition: A | Discharge status: Skilled Nursing Facility | Payer: Self-pay | Source: Home / Self Care | Attending: Internal Medicine

## 2023-03-31 ENCOUNTER — Other Ambulatory Visit (HOSPITAL_COMMUNITY)

## 2023-03-31 ENCOUNTER — Inpatient Hospital Stay (HOSPITAL_COMMUNITY): Admitting: Registered Nurse

## 2023-03-31 ENCOUNTER — Inpatient Hospital Stay (HOSPITAL_COMMUNITY)

## 2023-03-31 ENCOUNTER — Other Ambulatory Visit: Payer: Self-pay

## 2023-03-31 ENCOUNTER — Encounter (HOSPITAL_COMMUNITY): Payer: Self-pay | Admitting: Internal Medicine

## 2023-03-31 DIAGNOSIS — I482 Chronic atrial fibrillation, unspecified: Secondary | ICD-10-CM

## 2023-03-31 DIAGNOSIS — I350 Nonrheumatic aortic (valve) stenosis: Secondary | ICD-10-CM

## 2023-03-31 DIAGNOSIS — I4891 Unspecified atrial fibrillation: Secondary | ICD-10-CM

## 2023-03-31 DIAGNOSIS — N1831 Chronic kidney disease, stage 3a: Secondary | ICD-10-CM

## 2023-03-31 DIAGNOSIS — M059 Rheumatoid arthritis with rheumatoid factor, unspecified: Secondary | ICD-10-CM | POA: Diagnosis not present

## 2023-03-31 DIAGNOSIS — I10 Essential (primary) hypertension: Secondary | ICD-10-CM

## 2023-03-31 DIAGNOSIS — Z7901 Long term (current) use of anticoagulants: Secondary | ICD-10-CM

## 2023-03-31 DIAGNOSIS — S72002A Fracture of unspecified part of neck of left femur, initial encounter for closed fracture: Secondary | ICD-10-CM | POA: Diagnosis not present

## 2023-03-31 DIAGNOSIS — N400 Enlarged prostate without lower urinary tract symptoms: Secondary | ICD-10-CM

## 2023-03-31 DIAGNOSIS — S72002P Fracture of unspecified part of neck of left femur, subsequent encounter for closed fracture with malunion: Secondary | ICD-10-CM

## 2023-03-31 DIAGNOSIS — I129 Hypertensive chronic kidney disease with stage 1 through stage 4 chronic kidney disease, or unspecified chronic kidney disease: Secondary | ICD-10-CM | POA: Diagnosis not present

## 2023-03-31 HISTORY — PX: INTRAMEDULLARY (IM) NAIL INTERTROCHANTERIC: SHX5875

## 2023-03-31 LAB — URINALYSIS, COMPLETE (UACMP) WITH MICROSCOPIC
Bacteria, UA: NONE SEEN
Bilirubin Urine: NEGATIVE
Glucose, UA: NEGATIVE mg/dL
Hgb urine dipstick: NEGATIVE
Ketones, ur: NEGATIVE mg/dL
Leukocytes,Ua: NEGATIVE
Nitrite: NEGATIVE
Protein, ur: NEGATIVE mg/dL
Specific Gravity, Urine: 1.023 (ref 1.005–1.030)
pH: 5 (ref 5.0–8.0)

## 2023-03-31 LAB — CBC
HCT: 27.6 % — ABNORMAL LOW (ref 39.0–52.0)
Hemoglobin: 9.3 g/dL — ABNORMAL LOW (ref 13.0–17.0)
MCH: 31.3 pg (ref 26.0–34.0)
MCHC: 33.7 g/dL (ref 30.0–36.0)
MCV: 92.9 fL (ref 80.0–100.0)
Platelets: 239 10*3/uL (ref 150–400)
RBC: 2.97 MIL/uL — ABNORMAL LOW (ref 4.22–5.81)
RDW: 18.9 % — ABNORMAL HIGH (ref 11.5–15.5)
WBC: 13.6 10*3/uL — ABNORMAL HIGH (ref 4.0–10.5)
nRBC: 0 % (ref 0.0–0.2)

## 2023-03-31 LAB — BASIC METABOLIC PANEL
Anion gap: 17 — ABNORMAL HIGH (ref 5–15)
BUN: 22 mg/dL (ref 8–23)
CO2: 21 mmol/L — ABNORMAL LOW (ref 22–32)
Calcium: 8.8 mg/dL — ABNORMAL LOW (ref 8.9–10.3)
Chloride: 98 mmol/L (ref 98–111)
Creatinine, Ser: 1.11 mg/dL (ref 0.61–1.24)
GFR, Estimated: >60 mL/min (ref >60)
Glucose, Bld: 159 mg/dL — ABNORMAL HIGH (ref 70–99)
Potassium: 3.8 mmol/L (ref 3.5–5.1)
Sodium: 136 mmol/L (ref 135–145)

## 2023-03-31 SURGERY — FIXATION, FRACTURE, INTERTROCHANTERIC, WITH INTRAMEDULLARY ROD
Anesthesia: General | Laterality: Left

## 2023-03-31 MED ORDER — SUGAMMADEX SODIUM 200 MG/2ML IV SOLN
INTRAVENOUS | Status: DC | PRN
  Administered 2023-03-31: 137.8 mg via INTRAVENOUS

## 2023-03-31 MED ORDER — PROPOFOL 10 MG/ML IV BOLUS
INTRAVENOUS | Status: AC
  Filled 2023-03-31: qty 20

## 2023-03-31 MED ORDER — CHLORHEXIDINE GLUCONATE 0.12 % MT SOLN
OROMUCOSAL | Status: AC
  Administered 2023-03-31: 15 mL via OROMUCOSAL
  Filled 2023-03-31: qty 15

## 2023-03-31 MED ORDER — ONDANSETRON HCL 4 MG/2ML IJ SOLN: 4.0000 mg | Freq: Four times a day (QID) | INTRAMUSCULAR | Status: DC | PRN

## 2023-03-31 MED ORDER — ONDANSETRON HCL 4 MG PO TABS: 4.0000 mg | ORAL_TABLET | Freq: Four times a day (QID) | ORAL | Status: DC | PRN

## 2023-03-31 MED ORDER — CEFAZOLIN SODIUM-DEXTROSE 2-4 GM/100ML-% IV SOLN
INTRAVENOUS | Status: AC
  Filled 2023-03-31: qty 100

## 2023-03-31 MED ORDER — ACETAMINOPHEN 10 MG/ML IV SOLN
INTRAVENOUS | Status: DC | PRN
  Administered 2023-03-31: 1000 mg via INTRAVENOUS

## 2023-03-31 MED ORDER — TRANEXAMIC ACID-NACL 1000-0.7 MG/100ML-% IV SOLN
INTRAVENOUS | Status: AC
  Filled 2023-03-31: qty 100

## 2023-03-31 MED ORDER — POVIDONE-IODINE 10 % EX SWAB
2.0000 | Freq: Once | CUTANEOUS | Status: AC
  Administered 2023-03-31: 2 via TOPICAL

## 2023-03-31 MED ORDER — DOCUSATE SODIUM 100 MG PO CAPS
100.0000 mg | ORAL_CAPSULE | Freq: Two times a day (BID) | ORAL | Status: DC
  Administered 2023-03-31 – 2023-04-03 (×7): 100 mg via ORAL
  Filled 2023-03-31 (×8): qty 1

## 2023-03-31 MED ORDER — PHENYLEPHRINE HCL-NACL 20-0.9 MG/250ML-% IV SOLN
INTRAVENOUS | Status: DC | PRN
  Administered 2023-03-31: 50 ug/min via INTRAVENOUS

## 2023-03-31 MED ORDER — TRANEXAMIC ACID-NACL 1000-0.7 MG/100ML-% IV SOLN
INTRAVENOUS | Status: DC | PRN
  Administered 2023-03-31: 1000 mg via INTRAVENOUS

## 2023-03-31 MED ORDER — CEFAZOLIN SODIUM-DEXTROSE 2-4 GM/100ML-% IV SOLN
2.0000 g | INTRAVENOUS | Status: AC
  Administered 2023-03-31: 2 g via INTRAVENOUS
  Filled 2023-03-31: qty 100

## 2023-03-31 MED ORDER — ORAL CARE MOUTH RINSE: 15.0000 mL | Freq: Once | OROMUCOSAL | Status: AC

## 2023-03-31 MED ORDER — EPHEDRINE SULFATE-NACL 50-0.9 MG/10ML-% IV SOSY
PREFILLED_SYRINGE | INTRAVENOUS | Status: DC | PRN
  Administered 2023-03-31: 5 mg via INTRAVENOUS

## 2023-03-31 MED ORDER — ROCURONIUM BROMIDE 10 MG/ML (PF) SYRINGE
PREFILLED_SYRINGE | INTRAVENOUS | Status: DC | PRN
  Administered 2023-03-31: 10 mg via INTRAVENOUS
  Administered 2023-03-31: 40 mg via INTRAVENOUS

## 2023-03-31 MED ORDER — FENTANYL CITRATE (PF) 100 MCG/2ML IJ SOLN: 25.0000 ug | INTRAMUSCULAR | Status: DC | PRN

## 2023-03-31 MED ORDER — 0.9 % SODIUM CHLORIDE (POUR BTL) OPTIME
TOPICAL | Status: DC | PRN
  Administered 2023-03-31: 1000 mL

## 2023-03-31 MED ORDER — LACTATED RINGERS IV SOLN: INTRAVENOUS | Status: DC

## 2023-03-31 MED ORDER — ACETAMINOPHEN 10 MG/ML IV SOLN
INTRAVENOUS | Status: AC
Start: 2023-03-31 — End: 2023-03-31
  Filled 2023-03-31: qty 100

## 2023-03-31 MED ORDER — PROPOFOL 10 MG/ML IV BOLUS
INTRAVENOUS | Status: DC | PRN
  Administered 2023-03-31: 120 ug/kg/min via INTRAVENOUS
  Administered 2023-03-31: 70 mg via INTRAVENOUS

## 2023-03-31 MED ORDER — DEXAMETHASONE SODIUM PHOSPHATE 10 MG/ML IJ SOLN
INTRAMUSCULAR | Status: DC | PRN
  Administered 2023-03-31: 5 mg via INTRAVENOUS

## 2023-03-31 MED ORDER — CHLORHEXIDINE GLUCONATE 4 % EX SOLN
60.0000 mL | Freq: Once | CUTANEOUS | Status: AC
  Administered 2023-03-31: 4 via TOPICAL
  Filled 2023-03-31: qty 60

## 2023-03-31 MED ORDER — FENTANYL CITRATE (PF) 100 MCG/2ML IJ SOLN
INTRAMUSCULAR | Status: AC
  Filled 2023-03-31: qty 2

## 2023-03-31 MED ORDER — LIDOCAINE 2% (20 MG/ML) 5 ML SYRINGE
INTRAMUSCULAR | Status: DC | PRN
  Administered 2023-03-31: 40 mg via INTRAVENOUS

## 2023-03-31 MED ORDER — ONDANSETRON HCL 4 MG/2ML IJ SOLN
INTRAMUSCULAR | Status: DC | PRN
  Administered 2023-03-31: 4 mg via INTRAVENOUS

## 2023-03-31 MED ORDER — CHLORHEXIDINE GLUCONATE 0.12 % MT SOLN: 15.0000 mL | Freq: Once | OROMUCOSAL | Status: AC

## 2023-03-31 MED ORDER — FENTANYL CITRATE (PF) 250 MCG/5ML IJ SOLN
INTRAMUSCULAR | Status: DC | PRN
  Administered 2023-03-31: 25 ug via INTRAVENOUS

## 2023-03-31 SURGICAL SUPPLY — 38 items
BAG COUNTER SPONGE SURGICOUNT (BAG) ×1 IMPLANT
BIT DRILL 4.3MMS DISTAL GRDTED (BIT) IMPLANT
BNDG COHESIVE 4X5 TAN STRL LF (GAUZE/BANDAGES/DRESSINGS) ×1 IMPLANT
CHLORAPREP W/TINT 26 (MISCELLANEOUS) ×1 IMPLANT
CORTICAL BONE SCR 5.0MM X 46MM (Screw) ×1 IMPLANT
COVER PERINEAL POST (MISCELLANEOUS) ×1 IMPLANT
COVER SURGICAL LIGHT HANDLE (MISCELLANEOUS) ×1 IMPLANT
DERMABOND ADVANCED .7 DNX6 (GAUZE/BANDAGES/DRESSINGS) IMPLANT
DRAPE STERI IOBAN 125X83 (DRAPES) ×1 IMPLANT
DRESSING MEPILEX FLEX 4X4 (GAUZE/BANDAGES/DRESSINGS) ×3 IMPLANT
DRILL 4.3MMS DISTAL GRADUATED (BIT) ×1 IMPLANT
DRSG MEPILEX FLEX 4X4 (GAUZE/BANDAGES/DRESSINGS) ×2 IMPLANT
DRSG MEPILEX POST OP 4X8 (GAUZE/BANDAGES/DRESSINGS) IMPLANT
ELECT REM PT RETURN 9FT ADLT (ELECTROSURGICAL) ×1 IMPLANT
ELECTRODE REM PT RTRN 9FT ADLT (ELECTROSURGICAL) ×1 IMPLANT
GAUZE SPONGE 4X4 12PLY STRL (GAUZE/BANDAGES/DRESSINGS) IMPLANT
GLOVE BIO SURGEON STRL SZ7 (GLOVE) ×1 IMPLANT
GLOVE BIO SURGEON STRL SZ7.5 (GLOVE) ×1 IMPLANT
GLOVE BIOGEL PI IND STRL 8 (GLOVE) ×1 IMPLANT
GOWN STRL REUS W/ TWL LRG LVL3 (GOWN DISPOSABLE) ×2 IMPLANT
GUIDEPIN VERSANAIL DSP 3.2X444 (ORTHOPEDIC DISPOSABLE SUPPLIES) IMPLANT
GUIDEWIRE BALL NOSE 100CM (WIRE) IMPLANT
HFN LH 130 DEG 11MM X 380MM (Orthopedic Implant) IMPLANT
HIP FRA NAIL LAG SCREW 10.5X90 (Orthopedic Implant) ×1 IMPLANT
KIT TURNOVER KIT B (KITS) ×1 IMPLANT
MANIFOLD NEPTUNE II (INSTRUMENTS) ×1 IMPLANT
NS IRRIG 1000ML POUR BTL (IV SOLUTION) ×1 IMPLANT
PACK GENERAL/GYN (CUSTOM PROCEDURE TRAY) ×1 IMPLANT
PAD ARMBOARD 7.5X6 YLW CONV (MISCELLANEOUS) ×2 IMPLANT
SCREW CORTICL BON 5.0MM X 46MM (Screw) IMPLANT
SCREW LAG HIP FRA NAIL 10.5X90 (Orthopedic Implant) IMPLANT
STAPLER VISISTAT 35W (STAPLE) ×1 IMPLANT
SUT VIC AB 1 CT1 27XBRD ANBCTR (SUTURE) ×1 IMPLANT
SUT VIC AB 2-0 CT1 TAPERPNT 27 (SUTURE) ×1 IMPLANT
TOWEL GREEN STERILE (TOWEL DISPOSABLE) ×1 IMPLANT
TOWEL GREEN STERILE FF (TOWEL DISPOSABLE) ×1 IMPLANT
TRAY FOL W/BAG SLVR 16FR STRL (SET/KITS/TRAYS/PACK) IMPLANT
WATER STERILE IRR 1000ML POUR (IV SOLUTION) ×1 IMPLANT

## 2023-03-31 NOTE — Anesthesia Postprocedure Evaluation (Signed)
 Anesthesia Post Note  Patient: Shishir Krantz  Procedure(s) Performed: INTRAMEDULLARY (IM) NAIL INTERTROCHANTERIC (Left)     Patient location during evaluation: PACU Anesthesia Type: General Level of consciousness: awake and alert Pain management: pain level controlled Vital Signs Assessment: post-procedure vital signs reviewed and stable Respiratory status: spontaneous breathing, nonlabored ventilation, respiratory function stable and patient connected to nasal cannula oxygen Cardiovascular status: blood pressure returned to baseline and stable Postop Assessment: no apparent nausea or vomiting Anesthetic complications: no  No notable events documented.  Last Vitals:  Vitals:   03/31/23 1114 03/31/23 1431  BP: 127/83 (!) 107/59  Pulse: 76 78  Resp:    Temp: 36.8 C 37 C  SpO2: 95% 95%    Last Pain:  Vitals:   03/31/23 1114  TempSrc: Oral  PainSc:                  Shelton Silvas

## 2023-03-31 NOTE — Discharge Instructions (Signed)
 Discharge Instructions    Pain medicine has been prescribed for you.  Ice the operative area  You may shower 7 days after surgery. The incisions CANNOT get wet prior to 7 days.  Weight bearing as tolerated   Please call [phone] during normal business hours or [phone] after hours for any problems. Including the following:  - excessive redness of the incisions - drainage for more than 4 days - fever of more than 101.5 F  *Please note that pain medications will not be refilled after hours or on weekends.

## 2023-03-31 NOTE — Anesthesia Procedure Notes (Signed)
 Procedure Name: Intubation Date/Time: 03/31/2023 9:13 AM  Performed by: Loleta Chenise Mulvihill, CRNAPre-anesthesia Checklist: Patient identified, Patient being monitored, Timeout performed, Emergency Drugs available and Suction available Patient Re-evaluated:Patient Re-evaluated prior to induction Oxygen Delivery Method: Circle system utilized Preoxygenation: Pre-oxygenation with 100% oxygen Induction Type: IV induction Ventilation: Mask ventilation without difficulty Laryngoscope Size: Mac and 4 Grade View: Grade I Tube type: Oral Tube size: 7.0 mm Number of attempts: 1 Airway Equipment and Method: Stylet Placement Confirmation: ETT inserted through vocal cords under direct vision, positive ETCO2 and breath sounds checked- equal and bilateral Secured at: 22 cm Tube secured with: Tape Dental Injury: Teeth and Oropharynx as per pre-operative assessment

## 2023-03-31 NOTE — Progress Notes (Signed)
 PROGRESS NOTE    Cory Gonzalez  ZOX:[id number] DOB: 1929-11-24 DOA: 03/30/2023 PCP: [facility]    Chief Complaint  Patient presents with   Fall    Brief Narrative:  Patient 89 year old gentleman history of A-fib on Xarelto, chronic diastolic heart failure, hypertension, rheumatoid arthritis presented to the ED after a fall.  Patient noted last dose of Xarelto was 03/28/2023.  Plain films of the left hip and pelvis done with an acute commuted intertrochanteric fracture of the left femur.  Orthopedics consulted.  Patient underwent IM nail per orthopedics on 03/31/2023.  Patient also noted to have an episode of bradycardia in the ED and seen in consultation by cardiology.   Assessment & Plan:   Principal Problem:   Closed left hip fracture (HCC) Active Problems:   HTN (hypertension)   Rheumatoid arthritis (HCC)   Atrial fibrillation, chronic (HCC)   CKD stage 3a, GFR 45-59 ml/min (HCC)   BPH (benign prostatic hyperplasia)  #1 left hip intertrochanteric proximal femur fracture -Secondary to mechanical fall. -Patient on presentation underwent plain films of the left hip and pelvis which showed an acute commuted intertrochanteric fracture of the left femur. -Patient seen in consultation by orthopedics and patient underwent IM nail this morning 03/31/2023. -PT/OT. -Pain management per orthopedics. -Orthopedics following appreciate input and recommendations.  2.  Bradycardia -Patient seen in consultation by cardiology who felt may have been related to choking or hypoxia as bradycardia improved after coughing and clearing his airway. -Repeat 2D echo pending. -Per cardiology.  3.  Aortic stenosis -Known history of cardiac murmur for several years. -2D echo from 2021 with mild AS. -Repeat 2D echo pending. -Cardiology following.  4.  Chronic A-fib -Currently rate controlled. -Xarelto on hold, orthopedics to advise when Xarelto may be resumed hopefully in the next 24  hours. -Cardiology following.  5.  CKD 3a -Stable.  6.  Hypertension -Continue home regimen Norvasc, lisinopril, HCTZ.  7.  Rheumatoid arthritis -Patient noted to be on infliximab every 8 weeks, methotrexate every Friday (last dose 03/29/2023). -Continue prednisone.   DVT prophylaxis: Resume preop anticoagulation when cleared by orthopedics. Code Status: DNR limited Family Communication: Updated wife at bedside. Disposition: TBD  Status is: Inpatient Remains inpatient appropriate because: Severity of illness   Consultants:  Orthopedics: Dr. Ave Filter 03/30/2023 Cardiology: Dr. Rennis Golden 03/30/2023  Procedures:  2D echo pending CT head CT C-spine 03/30/2023 Plain films of the left hip and pelvis 03/30/2023 Chest x-ray 03/30/2023. IM nail left femur 03/31/2023 per orthopedics: Dr. Ave Filter  Antimicrobials:  Anti-infectives (From admission, onward)    Start     Dose/Rate Route Frequency Ordered Stop   03/31/23 0822  ceFAZolin (ANCEF) 2-4 GM/100ML-% IVPB       Note to Pharmacy: Aquilla Hacker M: cabinet override      03/31/23 0822 03/31/23 0934   03/31/23 0600  ceFAZolin (ANCEF) IVPB 2g/100 mL premix        2 g 200 mL/hr over 30 Minutes Intravenous On call to O.R. 03/31/23 0130 03/31/23 0915         Subjective: Just returned from the OR.  Sedated.  Wife at bedside.  Objective: Vitals:   03/31/23 1030 03/31/23 1045 03/31/23 1100 03/31/23 1114  BP: 115/76 114/71 (P) 123/70 127/83  Pulse: 77 77 79 76  Resp: 20 17 15    Temp:   (P) 98.4 F (36.9 C) 98.2 F (36.8 C)  TempSrc:    Oral  SpO2: 98% 94% 95% 95%  Weight:  Height:        Intake/Output Summary (Last 24 hours) at 03/31/2023 1207 Last data filed at 03/31/2023 1020 Gross per 24 hour  Intake 2000 ml  Output 1600 ml  Net 400 ml   Filed Weights   03/30/23 0848 03/30/23 1447 03/31/23 0819  Weight: 63.5 kg 68.9 kg 68.9 kg    Examination:  General exam: Sedated. Respiratory system: CTAB anterior lung fields.  No  wheezes, no crackles, no rhonchi.  Fair air movement.  Speaking in full sentences.  Cardiovascular system: Irregularly irregular with 3/6 SEM and left lower sternal border, left upper sternal border with radiation to carotids.  No JVD.  No pitting lower extremity edema.  Gastrointestinal system: Abdomen is soft, nontender, nondistended, positive bowel sounds.  No rebound.  No guarding.  Central nervous system: Alert and oriented. No focal neurological deficits. Extremities: Left lower extremity dressing in place. Skin: No rashes, lesions or ulcers Psychiatry: Judgement and insight unable to assess.  Mood and affect unable to assess.      Data Reviewed: I have personally reviewed following labs and imaging studies  CBC: Recent Labs  Lab 03/30/23 0857 03/31/23 0502  WBC 9.7 13.6*  HGB 10.3* 9.3*  HCT 32.0* 27.6*  MCV 95.5 92.9  PLT 256 239    Basic Metabolic Panel: Recent Labs  Lab 03/30/23 0857 03/31/23 0502  NA 136 136  K 4.2 3.8  CL 103 98  CO2 22 21*  GLUCOSE 150* 159*  BUN 21 22  CREATININE 1.17 1.11  CALCIUM 8.7* 8.8*    GFR: Estimated Creatinine Clearance: 37.7 mL/min (by C-G formula based on SCr of 1.11 mg/dL).  Liver Function Tests: Recent Labs  Lab 03/30/23 0857  AST 31  ALT 22  ALKPHOS 61  BILITOT 0.7  PROT 5.9*  ALBUMIN 3.3*    CBG: No results for input(s): "GLUCAP" in the last 168 hours.   Recent Results (from the past 240 hours)  Surgical PCR screen     Status: None   Collection Time: 03/30/23  4:08 PM   Specimen: Nasal Mucosa; Nasal Swab  Result Value Ref Range Status   MRSA, PCR NEGATIVE NEGATIVE Final   Staphylococcus aureus NEGATIVE NEGATIVE Final    Comment: (NOTE) The Xpert SA Assay (FDA approved for NASAL specimens in patients 50 years of age and older), is one component of a comprehensive surveillance program. It is not intended to diagnose infection nor to guide or monitor treatment. Performed at [facility], [address], [place], [place] [address]          Radiology Studies: DG C-Arm 1-60 Min-No Report Result Date: 03/31/2023 Fluoroscopy was utilized by the requesting physician.  No radiographic interpretation.   DG Chest 1 View Result Date: 03/30/2023 CLINICAL DATA:  Status post fall.  Possible vomiting. EXAM: CHEST  1 VIEW COMPARISON:  Chest CT 09/12/2020. Radiographs 07/27/2019 and 10/09/2017. FINDINGS: 0914 hours. Evaluation of the left lung base is limited by what appears to be the patient's overlying left hand. The heart size and mediastinal contours are stable for technique. There is mild aortic atherosclerosis and a hiatal hernia. Chronic central airway thickening and scarring at both lung bases without definite superimposed airspace disease, pneumothorax or significant pleural effusion. No acute osseous findings are evident. There is evidence of a chronic rotator cuff tear on the right. IMPRESSION: No definite acute cardiopulmonary process. Chronic central airway thickening and scarring at both lung bases. Limited evaluation of the left lung base  due to the patient's overlapping hand. Electronically Signed   By: Carey Bullocks M.D.   On: 03/30/2023 10:17   DG Hip Unilat With Pelvis 2-3 Views Left Result Date: 03/30/2023 CLINICAL DATA:  Status post fall.  Pain. EXAM: DG HIP (WITH OR WITHOUT PELVIS) 2-3V LEFT COMPARISON:  None Available. FINDINGS: There is in acute, comminuted fracture deformity involving the intertrochanteric portions of the proximal left femur. There is medial angulation of the distal fracture fragments. No dislocation. Mild degenerative changes noted within both hips. Signs of penile prosthesis. IMPRESSION: Acute, comminuted intertrochanteric fracture deformity of the proximal left femur. Electronically Signed   By: Signa Kell M.D.   On: 03/30/2023 10:13   CT Cervical Spine Wo Contrast Result Date: 03/30/2023 CLINICAL DATA:  Neck trauma.  Head trauma. EXAM: CT HEAD WITHOUT  CONTRAST CT CERVICAL SPINE WITHOUT CONTRAST TECHNIQUE: Multidetector CT imaging of the head and cervical spine was performed following the standard protocol without intravenous contrast. Multiplanar CT image reconstructions of the cervical spine were also generated. RADIATION DOSE REDUCTION: This exam was performed according to the departmental dose-optimization program which includes automated exposure control, adjustment of the mA and/or kV according to patient size and/or use of iterative reconstruction technique. 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 Small areas of cortical encephalomalacia in the left inferior temporal and low right parietal cortex. Low-density in the cerebral white matter attributed to chronic small vessel ischemia. No evidence of acute hemorrhage, hydrocephalus, mass, or collection. Vascular: No hyperdense vessel or unexpected calcification. Skull: Normal. Negative for fracture or focal lesion. Sinuses/Orbits: Negative CT CERVICAL SPINE FINDINGS Alignment: No traumatic malalignment Skull base and vertebrae: No acute fracture Soft tissues and spinal canal: No prevertebral fluid or swelling. No visible canal hematoma. Disc levels:  Generalized degenerative endplate and facet spurring. Upper chest: Emphysema. IMPRESSION: 1. No evidence of acute intracranial or cervical spine injury. 2. 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 Electronically Signed   By: Tiburcio Pea M.D.   On: 03/30/2023 10:12   CT Head Wo Contrast Result Date: 03/30/2023 CLINICAL DATA:  Neck trauma.  Head trauma. EXAM: CT HEAD WITHOUT CONTRAST CT CERVICAL SPINE WITHOUT CONTRAST TECHNIQUE: Multidetector CT imaging of the head and cervical spine was performed following the standard protocol without intravenous contrast. Multiplanar CT image reconstructions of the cervical  spine were also generated. RADIATION DOSE REDUCTION: This exam was performed according to the departmental dose-optimization program which includes automated exposure control, adjustment of the mA and/or kV according to patient size and/or use of iterative reconstruction technique. 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 Small areas of cortical encephalomalacia in the left inferior temporal and low right parietal cortex. Low-density in the cerebral white matter attributed to chronic small vessel ischemia. No evidence of acute hemorrhage, hydrocephalus, mass, or collection. Vascular: No hyperdense vessel or unexpected calcification. Skull: Normal. Negative for fracture or focal lesion. Sinuses/Orbits: Negative CT CERVICAL SPINE FINDINGS Alignment: No traumatic malalignment Skull base and vertebrae: No acute fracture Soft tissues and spinal canal: No prevertebral fluid or swelling. No visible canal hematoma. Disc levels:  Generalized degenerative endplate and facet spurring. Upper chest: Emphysema. IMPRESSION: 1. No evidence of acute intracranial or cervical spine injury. 2. 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 Electronically  Signed   By: Tiburcio Pea M.D.   On: 03/30/2023 10:12        Scheduled Meds:  amLODipine  10 mg Oral Daily   docusate sodium  100 mg Oral BID   enoxaparin (LOVENOX) injection  40 mg Subcutaneous Q24H   folic acid  3 mg Oral Daily   hydrochlorothiazide  12.5 mg Oral Daily   latanoprost  1 drop Both Eyes QHS   lisinopril  20 mg Oral Daily   predniSONE  5 mg Oral QAC breakfast   sodium chloride flush  3 mL Intravenous Q12H   Continuous Infusions:   LOS: 1 day    Time spent: 35 minutes    Ramiro Harvest, MD [facility]   To contact the attending provider between 7A-7P or the covering provider during after  hours 7P-7A, please log into the web site [email or web address] and access using universal [place] password for that web site. If you do not have the password, please call the hospital operator.  03/31/2023, 12:07 PM

## 2023-03-31 NOTE — Plan of Care (Signed)
  Problem: Activity: Goal: Risk for activity intolerance will decrease Outcome: Not Progressing   Problem: Coping: Goal: Level of anxiety will decrease Outcome: Not Progressing   Problem: Pain Managment: Goal: General experience of comfort will improve and/or be controlled Outcome: Not Progressing   Problem: Safety: Goal: Ability to remain free from injury will improve Outcome: Not Progressing

## 2023-03-31 NOTE — Interval H&P Note (Signed)
 History and Physical Interval Note:  03/31/2023 8:53 AM  Cory Gonzalez  has presented today for surgery, with the diagnosis of Left hip fracture.  The various methods of treatment have been discussed with the patient and family. After consideration of risks, benefits and other options for treatment, the patient has consented to  Procedure(s): INTRAMEDULLARY (IM) NAIL INTERTROCHANTERIC (Left) as a surgical intervention.  The patient's history has been reviewed, patient examined, no change in status, stable for surgery.  I have reviewed the patient's chart and labs.  Questions were answered to the patient's satisfaction.     Glennon Hamilton

## 2023-03-31 NOTE — Transfer of Care (Signed)
 Immediate Anesthesia Transfer of Care Note  Patient: Cory Gonzalez  Procedure(s) Performed: INTRAMEDULLARY (IM) NAIL INTERTROCHANTERIC (Left)  Patient Location: PACU  Anesthesia Type:General  Level of Consciousness: drowsy  Airway & Oxygen Therapy: Patient Spontanous Breathing and Patient connected to face mask oxygen  Post-op Assessment: Report given to RN and Post -op Vital signs reviewed and stable  Post vital signs: Reviewed and stable  Last Vitals:  Vitals Value Taken Time  BP 108/60 03/31/23 1015  Temp    Pulse 74 03/31/23 1020  Resp 23 03/31/23 1020  SpO2 100 % 03/31/23 1020  Vitals shown include unfiled device data.  Last Pain:  Vitals:   03/31/23 0749  TempSrc: Oral  PainSc:          Complications: No notable events documented.

## 2023-03-31 NOTE — Anesthesia Preprocedure Evaluation (Addendum)
 Anesthesia Evaluation  Patient identified by MRN, date of birth, ID band Patient awake    Reviewed: Allergy & Precautions, NPO status , Patient's Chart, lab work & pertinent test results  History of Anesthesia Complications (+) PONV and history of anesthetic complications  Airway Mallampati: I  TM Distance: >3 FB Neck ROM: Full    Dental  (+) Edentulous Upper, Edentulous Lower   Pulmonary Current Smoker   breath sounds clear to auscultation       Cardiovascular hypertension, Pt. on medications + dysrhythmias Atrial Fibrillation  Rhythm:Irregular Rate:Normal  Echo:   1. Left ventricular ejection fraction, by estimation, is 70%. The left  ventricle has hyperdynamic function. The left ventricle has no regional  wall motion abnormalities. There is moderate asymmetric left ventricular  hypertrophy of the septal segment.  Left ventricular diastolic parameters are consistent with Grade I  diastolic dysfunction (impaired relaxation).   2. Right ventricular systolic function is normal. The right ventricular  size is normal.   3. The mitral valve is degenerative. Trivial mitral valve regurgitation.  No evidence of mitral stenosis.   4. The aortic valve is abnormal. Aortic valve regurgitation is not  visualized. Mild aortic valve stenosis. Aortic valve mean gradient  measures 13.0 mmHg. This may be underestimated due to an underfilled left  ventricle.     Neuro/Psych negative neurological ROS  negative psych ROS   GI/Hepatic negative GI ROS, Neg liver ROS,,,  Endo/Other  negative endocrine ROS    Renal/GU Renal disease     Musculoskeletal  (+) Arthritis , Rheumatoid disorders,    Abdominal   Peds  Hematology negative hematology ROS (+)   Anesthesia Other Findings   Reproductive/Obstetrics                             Anesthesia Physical Anesthesia Plan  ASA: 3  Anesthesia Plan: General    Post-op Pain Management: Tylenol PO (pre-op)*   Induction: Intravenous  PONV Risk Score and Plan: 3 and Ondansetron, Dexamethasone and Treatment may vary due to age or medical condition  Airway Management Planned: Oral ETT  Additional Equipment: None  Intra-op Plan:   Post-operative Plan: Extubation in OR  Informed Consent: I have reviewed the patients History and Physical, chart, labs and discussed the procedure including the risks, benefits and alternatives for the proposed anesthesia with the patient or authorized representative who has indicated his/her understanding and acceptance.   Patient has DNR.  Discussed DNR with power of attorney and Suspend DNR.     Plan Discussed with: CRNA  Anesthesia Plan Comments: (No compressions per POA.)       Anesthesia Quick Evaluation

## 2023-03-31 NOTE — Op Note (Signed)
 Procedure(s): INTRAMEDULLARY (IM) NAIL INTERTROCHANTERIC Procedure Note  Cory Gonzalez male 89 y.o. 03/31/2023  Preoperative diagnosis: Left hip intertrochanteric proximal femur fracture  Postoperative diagnosis: Same  Procedure(s) and Anesthesia Type:    * INTRAMEDULLARY (IM) NAIL INTERTROCHANTERIC - General  Surgeons and Role:    Jones Broom, MD - Primary   Indications:  89 y.o. male s/p fall with left hip fracture. Indicated for surgery to promote early ambulation, pain control and prevent complications of bed rest.     Surgeon: Glennon Hamilton   Assistants: Fredia Sorrow PA-C Amber was present and scrubbed throughout the procedure and was essential in positioning, retraction, exposure, and closure)  Anesthesia: General endotracheal anesthesia   Procedure Detail  INTRAMEDULLARY (IM) NAIL INTERTROCHANTERIC  Findings: Comminuted transverse fracture with acceptable alignment and fixation with Biomet long affixes nail 11 mm x 380 mm  Estimated Blood Loss:  200 mL         Drains: none  Blood Given: none          Specimens: none        Complications:  * No complications entered in OR log *         Disposition: PACU - hemodynamically stable.         Condition: stable    Procedure:  The patient was identified in the preoperative holding area  where I personally marked the operative site after verifying site, side,  and procedure with the patient. She was taken back to the operating  room where general anesthesia was induced without complication. She was  placed on the fracture table with the left lower extremity in traction,  and opposite lower extremity in a flexed abducted position. The arms were well  padded. Fluoroscopic imaging was used to verify reduction with gentle traction  and internal rotation. The left hip was then prepped and draped in the standard sterile fashion. An approximately 3 cm incision was made proximal  to the palpable greater  trochanter tip. Dissection was carried down to  the tip and the short guidewire was placed under fluoroscopic imaging.  The proximal entry reamer was used to open the canal and the ball-tipped  guidewire was then placed and advanced down the femoral canal. This was  used to obtain a measurement for the implant and then was subsequently  over reamed up to 12.5 mm by 0.5 mm increments. The 11 mm nail was then  advanced over the guidewire without difficulty and the proximal jig was  then placed and a small 1.5 cm incision was made on the lateral thigh to  advance the lag screw guide against the lateral aspect of the femur.  The guide pin was advanced and its position was verified in AP and  lateral planes to be centered in the head.  The guidewire was over  reamed and the appropriate size lag screw was advanced. The  proximal set screw was then advanced, backed off a quarter turn to allow  sliding. AP and lateral imaging demonstrated appropriate position of  the screw and reduction of the fracture. The proximal jig was then  removed. Attention was turned to the knee. Where using perfect  circles technique on the lateral x-ray, one distal interlocking screw  was placed from lateral to medial through a percutaneous incision.  Final fluoroscopic imaging in AP and lateral planes at the hip and the  knee demonstrated near anatomic reduction with appropriate length and  position of the hardware. All wounds were then copiously  irrigated with  normal saline and subsequently closed in layers with #1 Vicryl in a deep  fascia layer, 2-0 Vicryl in a deep dermal layer, and staples for skin  closure. Sterile dressings were then applied including 4x4s and Mepilex  dressings. The patient was then taken off the fracture table,  transferred to the stretcher, and taken to the recovery room in stable  condition after she was extubated.   POSTOPERATIVE PLAN: he will be weightbearing as tolerated on the  operative extremity.  He will resume his preoperative anticoagulation which will cover DVT prophylaxis.

## 2023-04-01 ENCOUNTER — Inpatient Hospital Stay (HOSPITAL_COMMUNITY)

## 2023-04-01 ENCOUNTER — Encounter (HOSPITAL_COMMUNITY): Payer: Self-pay | Admitting: Orthopedic Surgery

## 2023-04-01 DIAGNOSIS — I482 Chronic atrial fibrillation, unspecified: Secondary | ICD-10-CM | POA: Diagnosis not present

## 2023-04-01 DIAGNOSIS — S72002P Fracture of unspecified part of neck of left femur, subsequent encounter for closed fracture with malunion: Secondary | ICD-10-CM | POA: Diagnosis not present

## 2023-04-01 DIAGNOSIS — I351 Nonrheumatic aortic (valve) insufficiency: Secondary | ICD-10-CM | POA: Diagnosis not present

## 2023-04-01 DIAGNOSIS — I340 Nonrheumatic mitral (valve) insufficiency: Secondary | ICD-10-CM | POA: Diagnosis not present

## 2023-04-01 DIAGNOSIS — M059 Rheumatoid arthritis with rheumatoid factor, unspecified: Secondary | ICD-10-CM | POA: Diagnosis not present

## 2023-04-01 DIAGNOSIS — N1831 Chronic kidney disease, stage 3a: Secondary | ICD-10-CM | POA: Diagnosis not present

## 2023-04-01 LAB — ECHOCARDIOGRAM COMPLETE
AR max vel: 1.55 cm2
AV Area VTI: 1.8 cm2
AV Area mean vel: 1.61 cm2
AV Mean grad: 5.3 mmHg
AV Peak grad: 9.5 mmHg
Ao pk vel: 1.54 m/s
Area-P 1/2: 4.57 cm2
Height: 65 in
S' Lateral: 2.8 cm
Weight: 2430.35 [oz_av]

## 2023-04-01 LAB — BASIC METABOLIC PANEL
Anion gap: 12 (ref 5–15)
BUN: 33 mg/dL — ABNORMAL HIGH (ref 8–23)
CO2: 21 mmol/L — ABNORMAL LOW (ref 22–32)
Calcium: 8.3 mg/dL — ABNORMAL LOW (ref 8.9–10.3)
Chloride: 100 mmol/L (ref 98–111)
Creatinine, Ser: 1.19 mg/dL (ref 0.61–1.24)
GFR, Estimated: 58 mL/min — ABNORMAL LOW (ref >60)
Glucose, Bld: 117 mg/dL — ABNORMAL HIGH (ref 70–99)
Potassium: 3.9 mmol/L (ref 3.5–5.1)
Sodium: 133 mmol/L — ABNORMAL LOW (ref 135–145)

## 2023-04-01 LAB — CBC
HCT: 22.8 % — ABNORMAL LOW (ref 39.0–52.0)
Hemoglobin: 7.7 g/dL — ABNORMAL LOW (ref 13.0–17.0)
MCH: 31.2 pg (ref 26.0–34.0)
MCHC: 33.8 g/dL (ref 30.0–36.0)
MCV: 92.3 fL (ref 80.0–100.0)
Platelets: 210 10*3/uL (ref 150–400)
RBC: 2.47 MIL/uL — ABNORMAL LOW (ref 4.22–5.81)
RDW: 18.7 % — ABNORMAL HIGH (ref 11.5–15.5)
WBC: 14.7 10*3/uL — ABNORMAL HIGH (ref 4.0–10.5)
nRBC: 0 % (ref 0.0–0.2)

## 2023-04-01 LAB — URINE CULTURE: Culture: NO GROWTH

## 2023-04-01 LAB — MAGNESIUM: Magnesium: 2 mg/dL (ref 1.7–2.4)

## 2023-04-01 MED ORDER — HYDROCODONE-ACETAMINOPHEN 5-325 MG PO TABS
1.0000 | ORAL_TABLET | Freq: Four times a day (QID) | ORAL | Status: DC | PRN
  Administered 2023-04-01 – 2023-04-03 (×6): 1 via ORAL
  Filled 2023-04-01 (×7): qty 1

## 2023-04-01 MED ORDER — AMLODIPINE BESYLATE 10 MG PO TABS
10.0000 mg | ORAL_TABLET | Freq: Every day | ORAL | Status: DC
Start: 2023-04-02 — End: 2023-04-01

## 2023-04-01 MED ORDER — RIVAROXABAN 15 MG PO TABS
15.0000 mg | ORAL_TABLET | Freq: Every day | ORAL | Status: DC
  Administered 2023-04-01 – 2023-04-03 (×3): 15 mg via ORAL
  Filled 2023-04-01 (×3): qty 1

## 2023-04-01 MED ORDER — HYDROCODONE-ACETAMINOPHEN 5-325 MG PO TABS: 1.0000 | ORAL_TABLET | Freq: Four times a day (QID) | ORAL | 0 refills | Status: DC | PRN

## 2023-04-01 MED ORDER — IPRATROPIUM-ALBUTEROL 0.5-2.5 (3) MG/3ML IN SOLN: 3.0000 mL | Freq: Three times a day (TID) | RESPIRATORY_TRACT | Status: DC

## 2023-04-01 MED ORDER — ACETAMINOPHEN 325 MG PO TABS
650.0000 mg | ORAL_TABLET | Freq: Four times a day (QID) | ORAL | Status: DC | PRN
  Administered 2023-04-03: 650 mg via ORAL
  Filled 2023-04-01: qty 2

## 2023-04-01 MED ORDER — MOMETASONE FURO-FORMOTEROL FUM 200-5 MCG/ACT IN AERO
2.0000 | INHALATION_SPRAY | Freq: Two times a day (BID) | RESPIRATORY_TRACT | Status: DC
  Administered 2023-04-01 – 2023-04-03 (×4): 2 via RESPIRATORY_TRACT
  Filled 2023-04-01: qty 8.8

## 2023-04-01 MED ORDER — IPRATROPIUM-ALBUTEROL 0.5-2.5 (3) MG/3ML IN SOLN
3.0000 mL | Freq: Three times a day (TID) | RESPIRATORY_TRACT | Status: DC
  Administered 2023-04-01 – 2023-04-03 (×4): 3 mL via RESPIRATORY_TRACT
  Filled 2023-04-01 (×4): qty 3

## 2023-04-01 NOTE — Progress Notes (Deleted)
 [name] Kitchen

## 2023-04-01 NOTE — Progress Notes (Signed)
 ECHO reassuring. No aortic stenosis.   Please let us know if we can be of further assistance.  We will sign off.  Donato Schultz, MD

## 2023-04-01 NOTE — Evaluation (Signed)
 Physical Therapy Evaluation Patient Details Name: Cory Gonzalez MRN: [id number] DOB: 1929-08-22 Today's Date: 04/01/2023  History of Present Illness  Cory Gonzalez is a 89 y.o. male with medical history significant of A-fib on Xarelto, chronic diastolic heart failure, hypertension, rheumatoid arthritis who presents after a fall at home.  Clinical Impression  Pt admitted with above diagnosis. Lives at home with his wife, in a single-level home; Prior to admission, pt was able to walk household distance with RW; reports recent falls; Presents to PT with decr functional mobility compared to baseline, significant LLE pain, especially with wieight bearing effecting stability in LLE stance, and overall decr safety with amb ;  Needed +2 Max assist to come to sit EOB, to stand, and to take a few steps with RW and chair follow for safety; Buckled in L stance, needing Max assist to prevent fall; Patient will benefit from continued inpatient follow up therapy, <3 hours/day;Pt currently with functional limitations due to the deficits listed below (see PT Problem List). Pt will benefit from skilled PT to increase their independence and safety with mobility to allow discharge to the venue listed below.           If plan is discharge home, recommend the following: A lot of help with walking and/or transfers;A lot of help with bathing/dressing/bathroom;Help with stairs or ramp for entrance   Can travel by private vehicle        Equipment Recommendations Rolling walker (2 wheels);BSC/3in1  Recommendations for Other Services       Functional Status Assessment Patient has had a recent decline in their functional status and demonstrates the ability to make significant improvements in function in a reasonable and predictable amount of time.     Precautions / Restrictions Precautions Precautions: Fall;Other (comment) (watch O2 sats on room air) Recall of Precautions/Restrictions: Impaired Restrictions Weight  Bearing Restrictions Per Provider Order: No LLE Weight Bearing Per Provider Order: Weight bearing as tolerated Other Position/Activity Restrictions: Noting O2 sat drop on room air      Mobility  Bed Mobility Overal bed mobility: Needs Assistance Bed Mobility: Supine to Sit     Supine to sit: +2 for physical assistance, Mod assist     General bed mobility comments: Moda assist to elevate trunk to sit    Transfers Overall transfer level: Needs assistance Equipment used: Rolling walker (2 wheels) Transfers: Sit to/from Stand Sit to Stand: Mod assist, +2 physical assistance           General transfer comment: Heavy mod assist to power up to stand    Ambulation/Gait Ambulation/Gait assistance: +2 physical assistance, Mod assist, Max assist Gait Distance (Feet): 5 Feet Assistive device: Rolling walker (2 wheels) Gait Pattern/deviations: Step-to pattern Gait velocity: slow     General Gait Details: very painful in L stance with tendency to buckle L knee while advancing RLE; REquired physical assist to stabilize L knee and close chair follow for safety  Stairs            Wheelchair Mobility     Tilt Bed    Modified Rankin (Stroke Patients Only)       Balance Overall balance assessment: Needs assistance Sitting-balance support: Bilateral upper extremity supported, Feet supported Sitting balance-Leahy Scale: Poor       Standing balance-Leahy Scale: Poor                               Pertinent Vitals/Pain Pain  Assessment Pain Assessment: Faces Faces Pain Scale: Hurts even more Pain Location: LLE with movement/weight bearing Pain Descriptors / Indicators: Grimacing Pain Intervention(s): Monitored during session    Home Living Family/patient expects to be discharged to:: Private residence Living Arrangements: Spouse/significant other Available Help at Discharge: Family Type of Home: House Home Access: Stairs to enter Entrance  Stairs-Rails: None Entrance Stairs-Number of Steps: 2   Home Layout: One level Home Equipment: Rollator (4 wheels);Cane - single point (Need to verify)      Prior Function Prior Level of Function : Needs assist;History of Falls (last six months)             Mobility Comments: Walks with RW ADLs Comments: Assist from wife     Extremity/Trunk Assessment   Upper Extremity Assessment Upper Extremity Assessment: Generalized weakness    Lower Extremity Assessment Lower Extremity Assessment: LLE deficits/detail LLE Deficits / Details: Grossly decr aROM and strength, limited by pain post injury and surgical fixation       Communication   Communication Communication: Impaired Factors Affecting Communication: Hearing impaired    Cognition Arousal: Alert Behavior During Therapy: Restless, Impulsive   PT - Cognitive impairments: Difficult to assess, Attention Difficult to assess due to: Hard of hearing/deaf                     PT - Cognition Comments: Very focused on hearing aids throughout session Following commands: Intact       Cueing Cueing Techniques: Verbal cues, Gestural cues, Tactile cues, Visual cues     General Comments General comments (skin integrity, edema, etc.): Session conducted on room air; upon re-visit post session, noted O2 sast to be in the 80s, restarted supplemental O2 2 L and sats revocered    Exercises     Assessment/Plan    PT Assessment Patient needs continued PT services  PT Problem List Decreased strength;Decreased range of motion;Decreased activity tolerance;Decreased balance;Decreased mobility;Decreased coordination;Decreased cognition;Decreased knowledge of use of DME;Decreased safety awareness;Decreased knowledge of precautions;Cardiopulmonary status limiting activity;Pain       PT Treatment Interventions DME instruction;Gait training;Functional mobility training;Therapeutic activities;Therapeutic exercise;Balance  training;Neuromuscular re-education;Cognitive remediation;Patient/family education;Wheelchair mobility training    PT Goals (Current goals can be found in the Care Plan section)  Acute Rehab PT Goals Patient Stated Goal: Did not state PT Goal Formulation: With patient/family Time For Goal Achievement: 04/15/23 Potential to Achieve Goals: Good    Frequency Min 1X/week     Co-evaluation               AM-PAC PT "6 Clicks" Mobility  Outcome Measure Help needed turning from your back to your side while in a flat bed without using bedrails?: A Lot Help needed moving from lying on your back to sitting on the side of a flat bed without using bedrails?: Total Help needed moving to and from a bed to a chair (including a wheelchair)?: Total Help needed standing up from a chair using your arms (e.g., wheelchair or bedside chair)?: Total Help needed to walk in hospital room?: Total Help needed climbing 3-5 steps with a railing? : Total 6 Click Score: 7    End of Session Equipment Utilized During Treatment: Gait belt Activity Tolerance: Patient tolerated treatment well Patient left: in chair;with call bell/phone within reach;with chair alarm set;with family/visitor present Nurse Communication: Mobility status PT Visit Diagnosis: Unsteadiness on feet (R26.81);Pain;History of falling (Z91.81) Pain - Right/Left: Left Pain - part of body: Hip    Time: 4098-1191 PT  Time Calculation (min) (ACUTE ONLY): 21 min   Charges:   PT Evaluation $PT Eval Moderate Complexity: 1 Mod   PT General Charges $$ ACUTE PT VISIT: 1 Visit         Van Clines, PT  Acute Rehabilitation Services Office [phone] Secure Chat welcomed   Levi Aland 04/01/2023, 4:17 PM

## 2023-04-01 NOTE — Progress Notes (Signed)
*  PRELIMINARY RESULTS* Echocardiogram 2D Echocardiogram has been performed.  Cory Gonzalez 04/01/2023, 10:02 AM

## 2023-04-01 NOTE — Progress Notes (Signed)
 PATIENT ID: Cory Gonzalez  MRN: [id number]  DOB/AGE:  02-Jan-1930 / 89 y.o.  1 Day Post-Op Procedure(s) (LRB): INTRAMEDULLARY (IM) NAIL INTERTROCHANTERIC (Left)  Subjective: Patient sitting comfortably in room. Eating breakfast. Reports that he feels "fine".   Objective: Vital signs in last 24 hours: Temp:  [97.8 F (36.6 C)-99.3 F (37.4 C)] 97.8 F (36.6 C) (03/03 0730) Pulse Rate:  [67-104] 104 (03/03 0730) Resp:  [15-24] 16 (03/03 0530) BP: (103-127)/(58-83) 105/58 (03/03 0730) SpO2:  [85 %-98 %] 90 % (03/03 0730)  Intake/Output from previous day: 03/02 0701 - 03/03 0700 In: 900 [I.V.:600; IV Piggyback:300] Out: 820 [Urine:720; Blood:100] Intake/Output this shift: Total I/O In: -  Out: 120 [Urine:120]  Recent Labs    03/30/23 0857 03/31/23 0502 04/01/23 0622  HGB 10.3* 9.3* 7.7*   Recent Labs    03/31/23 0502 04/01/23 0622  WBC 13.6* 14.7*  RBC 2.97* 2.47*  HCT 27.6* 22.8*  PLT 239 210   Recent Labs    03/31/23 0502 04/01/23 0622  NA 136 133*  K 3.8 3.9  CL 98 100  CO2 21* 21*  BUN 22 33*  CREATININE 1.11 1.19  GLUCOSE 159* 117*  CALCIUM 8.8* 8.3*     Physical Exam: Neurologically intact Sensation intact distally Intact pulses distally Dorsiflexion/Plantar flexion intact Incision: dressing C/D/I No cellulitis present Compartment soft  Assessment/Plan: 1 Day Post-Op Procedure(s) (LRB): INTRAMEDULLARY (IM) NAIL INTERTROCHANTERIC (Left)   Advance diet Up with therapy Discharge to SNF when stable and beds arrangements made Weight Bearing as Tolerated (WBAT) VTE prophylaxis: resume Xarelto  Plan to work with PT today. Social worker to meet with patient regarding SNF DC arrangements. Ortho will continue to follow. Reinforce dressings as needed by RN.   Cory Gonzalez L. [place] 04/01/2023, 8:41 AM

## 2023-04-01 NOTE — Progress Notes (Addendum)
 PROGRESS NOTE    Cory Gonzalez  VHQ:[id number] DOB: 05-29-1929 DOA: 03/30/2023 PCP: [facility]    Chief Complaint  Patient presents with   Fall    Brief Narrative:  Patient 89 year old gentleman history of A-fib on Xarelto, chronic diastolic heart failure, hypertension, rheumatoid arthritis presented to the ED after a fall.  Patient noted last dose of Xarelto was 03/28/2023.  Plain films of the left hip and pelvis done with an acute commuted intertrochanteric fracture of the left femur.  Orthopedics consulted.  Patient underwent IM nail per orthopedics on 03/31/2023.  Patient also noted to have an episode of bradycardia in the ED and seen in consultation by cardiology.   Assessment & Plan:   Principal Problem:   Closed left hip fracture (HCC) Active Problems:   HTN (hypertension)   Rheumatoid arthritis (HCC)   Atrial fibrillation, chronic (HCC)   CKD stage 3a, GFR 45-59 ml/min (HCC)   BPH (benign prostatic hyperplasia)   Current use of long term anticoagulation   Aortic valve stenosis  #1 left hip intertrochanteric proximal femur fracture -Secondary to mechanical fall. -Patient on presentation underwent plain films of the left hip and pelvis which showed an acute commuted intertrochanteric fracture of the left femur. -Patient seen in consultation by orthopedics and patient underwent IM nail, 03/31/2023. -PT/OT. -Pain management per orthopedics. -Orthopedics following appreciate input and recommendations.  2.  Bradycardia -Patient seen in consultation by cardiology who felt may have been related to choking or hypoxia as bradycardia improved after coughing and clearing his airway. -Repeat 2D echo with a EF of 60 to 65%, NWMA, mildly reduced right ventricular systolic function, mild AVR, aortic valve sclerosis/calcification is present without any evidence of aortic stenosis. -Per cardiology.  3.  Mild aortic regurgitation. -Known history of cardiac murmur for  several years. -2D echo from 2021 with mild AS. -Repeat 2D echo with a EF of 60 to 65%, NWMA, mildly reduced right ventricular systolic function, mild AVR, aortic valve sclerosis/calcification is present without any evidence of aortic stenosis.  -Cardiology following.  4.  Chronic A-fib -Currently rate controlled. -Xarelto resumed per orthopedics today.  -Cardiology following.  5.  CKD 3a -Stable.  6.  Hypertension -BP noted to be soft with systolics of 105 this morning and as such we will hold antihypertensive medications of Norvasc, lisinopril, HCTZ today.   7.  Rheumatoid arthritis -Patient noted to be on infliximab every 8 weeks, methotrexate every Friday (last dose 03/29/2023). -Continue prednisone.  8.  Wheezing -Patient with prior history of tobacco use. -Placed on Dulera twice daily. -Scheduled DuoNebs 3 times daily.   DVT prophylaxis: Resume preop anticoagulation when cleared by orthopedics. Code Status: DNR limited Family Communication: Updated wife at bedside. Disposition: TBD  Status is: Inpatient Remains inpatient appropriate because: Severity of illness   Consultants:  Orthopedics: Dr. Ave Filter 03/30/2023 Cardiology: Dr. Rennis Golden 03/30/2023  Procedures:  2D echo 04/01/2023 CT head CT C-spine 03/30/2023 Plain films of the left hip and pelvis 03/30/2023 Chest x-ray 03/30/2023. IM nail left femur 03/31/2023 per orthopedics: Dr. Ave Filter  Antimicrobials:  Anti-infectives (From admission, onward)    Start     Dose/Rate Route Frequency Ordered Stop   03/31/23 0822  ceFAZolin (ANCEF) 2-4 GM/100ML-% IVPB       Note to Pharmacy: Aquilla Hacker M: cabinet override      03/31/23 0822 03/31/23 0934   03/31/23 0600  ceFAZolin (ANCEF) IVPB 2g/100 mL premix        2 g 200 mL/hr over  30 Minutes Intravenous On call to O.R. 03/31/23 0130 03/31/23 0915         Subjective: Patient laying in bed, getting ready to work with therapy.  Alert and oriented.  Denies any chest pain or  shortness of breath.  No abdominal pain.  Some improvement with left lower extremity pain.  Wife at bedside.    Objective: Vitals:   03/31/23 1950 04/01/23 0530 04/01/23 0600 04/01/23 0730  BP: 121/75 103/65  (!) 105/58  Pulse: 96 92  (!) 104  Resp: 18 16    Temp: 99 F (37.2 C) 98 F (36.7 C)  97.8 F (36.6 C)  TempSrc:    Oral  SpO2: 96% (!) 85% 92% 90%  Weight:      Height:        Intake/Output Summary (Last 24 hours) at 04/01/2023 1137 Last data filed at 04/01/2023 0816 Gross per 24 hour  Intake --  Output 840 ml  Net -840 ml   Filed Weights   03/30/23 0848 03/30/23 1447 03/31/23 0819  Weight: 63.5 kg 68.9 kg 68.9 kg    Examination:  General exam: Alert.  NAD. Respiratory system: Minimal expiratory wheezing.  No crackles.  No rhonchi.  Fair air movement.  Speaking in full sentences.  Cardiovascular system: Irregularly irregular with 3/6 SEM and left lower sternal border, left upper sternal border with radiation to carotids.  No JVD.  No pitting lower extremity edema.  Gastrointestinal system: Abdomen is soft, nontender, nondistended, positive bowel sounds.  No rebound.  No guarding.   Central nervous system: Alert and oriented. No focal neurological deficits. Extremities: Left lower extremity with dressing in place.  Left hip dressing somewhat soaked, ecchymosis around the left hip area.  Left lower extremity dressing in place. Skin: No rashes, lesions or ulcers Psychiatry: Judgement and insight unable to assess.  Mood and affect unable to assess.      Data Reviewed: I have personally reviewed following labs and imaging studies  CBC: Recent Labs  Lab 03/30/23 0857 03/31/23 0502 04/01/23 0622  WBC 9.7 13.6* 14.7*  HGB 10.3* 9.3* 7.7*  HCT 32.0* 27.6* 22.8*  MCV 95.5 92.9 92.3  PLT 256 239 210    Basic Metabolic Panel: Recent Labs  Lab 03/30/23 0857 03/31/23 0502 04/01/23 0622  NA 136 136 133*  K 4.2 3.8 3.9  CL 103 98 100  CO2 22 21* 21*  GLUCOSE  150* 159* 117*  BUN 21 22 33*  CREATININE 1.17 1.11 1.19  CALCIUM 8.7* 8.8* 8.3*  MG  --   --  2.0    GFR: Estimated Creatinine Clearance: 35.2 mL/min (by C-G formula based on SCr of 1.19 mg/dL).  Liver Function Tests: Recent Labs  Lab 03/30/23 0857  AST 31  ALT 22  ALKPHOS 61  BILITOT 0.7  PROT 5.9*  ALBUMIN 3.3*    CBG: No results for input(s): "GLUCAP" in the last 168 hours.   Recent Results (from the past 240 hours)  Surgical PCR screen     Status: None   Collection Time: 03/30/23  4:08 PM   Specimen: Nasal Mucosa; Nasal Swab  Result Value Ref Range Status   MRSA, PCR NEGATIVE NEGATIVE Final   Staphylococcus aureus NEGATIVE NEGATIVE Final    Comment: (NOTE) The Xpert SA Assay (FDA approved for NASAL specimens in patients 72 years of age and older), is one component of a comprehensive surveillance program. It is not intended to diagnose infection nor to guide or monitor treatment. Performed  at [facility], [address] [place], [place] [address]   Urine Culture (for pregnant, neutropenic or urologic patients or patients with an indwelling urinary catheter)     Status: None   Collection Time: 03/31/23  8:20 AM   Specimen: Urine, Clean Catch  Result Value Ref Range Status   Specimen Description URINE, CLEAN CATCH  Final   Special Requests NONE  Final   Culture   Final    NO GROWTH Performed at [facility], [address] [place], [place] [address]    Report Status 04/01/2023 FINAL  Final         Radiology Studies: ECHOCARDIOGRAM COMPLETE Result Date: 04/01/2023    ECHOCARDIOGRAM REPORT   Patient Name:   KYEN TAITE Date of Exam: 04/01/2023 Medical Rec #:  [id number]   Height:       65.0 in Accession #:    [id number]  Weight:       151.9 lb Date of Birth:  1929-11-06   BSA:          1.760 m Patient Age:    89 years    BP:           105/58 mmHg Patient Gender: M           HR:           88 bpm. Exam Location:  Inpatient Procedure: 2D Echo,  Cardiac Doppler and Color Doppler (Both Spectral and Color            Flow Doppler were utilized during procedure). Indications:    Aortic stenosis; Mitral Valve regurg  History:        Patient has prior history of Echocardiogram examinations, most                 recent 07/30/2019. MVH8I, Aortic Valve Disease and Mitral Valve                 Disease; Risk Factors:Current Smoker and Hypertension.  Sonographer:    Dondra Prader RVT RCS Referring Phys: [phone] AMBER [place] IMPRESSIONS  1. Left ventricular ejection fraction, by estimation, is 60 to 65%. The left ventricle has normal function. The left ventricle has no regional wall motion abnormalities. Left ventricular diastolic function could not be evaluated. Elevated left atrial pressure.  2. Right ventricular systolic function is mildly reduced. The right ventricular size is normal. There is mildly elevated pulmonary artery systolic pressure. The estimated right ventricular systolic pressure is 40.3 mmHg.  3. The mitral valve is normal in structure. Mild mitral valve regurgitation. No evidence of mitral stenosis.  4. The aortic valve is normal in structure. There is moderate calcification of the aortic valve. There is moderate thickening of the aortic valve. Aortic valve regurgitation is mild. Aortic valve sclerosis/calcification is present, without any evidence of aortic stenosis. Aortic valve area, by VTI measures 1.80 cm. Aortic valve mean gradient measures 5.2 mmHg. Aortic valve Vmax measures 1.54 m/s. FINDINGS  Left Ventricle: Left ventricular ejection fraction, by estimation, is 60 to 65%. The left ventricle has normal function. The left ventricle has no regional wall motion abnormalities. The left ventricular internal cavity size was normal in size. There is  no left ventricular hypertrophy. Left ventricular diastolic function could not be evaluated due to mitral annular calcification (moderate or greater). Left ventricular diastolic function could not be  evaluated. Elevated left atrial pressure. Right Ventricle: The right ventricular size is normal. No increase in right ventricular wall thickness. Right  ventricular systolic function is mildly reduced. There is mildly elevated pulmonary artery systolic pressure. The tricuspid regurgitant velocity  is 2.84 m/s, and with an assumed right atrial pressure of 8 mmHg, the estimated right ventricular systolic pressure is 40.3 mmHg. Left Atrium: Left atrial size was normal in size. Right Atrium: Right atrial size was normal in size. Pericardium: There is no evidence of pericardial effusion. Mitral Valve: The mitral valve is normal in structure. Mild to moderate mitral annular calcification. Mild mitral valve regurgitation. No evidence of mitral valve stenosis. The mean mitral valve gradient is 2.0 mmHg. Tricuspid Valve: The tricuspid valve is normal in structure. Tricuspid valve regurgitation is not demonstrated. No evidence of tricuspid stenosis. Aortic Valve: The aortic valve is normal in structure. There is moderate calcification of the aortic valve. There is moderate thickening of the aortic valve. Aortic valve regurgitation is mild. Aortic valve sclerosis/calcification is present, without any  evidence of aortic stenosis. Aortic valve mean gradient measures 5.2 mmHg. Aortic valve peak gradient measures 9.5 mmHg. Aortic valve area, by VTI measures 1.80 cm. Pulmonic Valve: The pulmonic valve was normal in structure. Pulmonic valve regurgitation is trivial. No evidence of pulmonic stenosis. Aorta: The aortic root is normal in size and structure. Venous: The inferior vena cava was not well visualized. IAS/Shunts: No atrial level shunt detected by color flow Doppler.  LEFT VENTRICLE PLAX 2D LVIDd:         4.00 cm   Diastology LVIDs:         2.80 cm   LV e' medial:    6.75 cm/s LV PW:         1.20 cm   LV E/e' medial:  16.4 LV IVS:        1.40 cm   LV e' lateral:   12.10 cm/s LVOT diam:     2.00 cm   LV E/e' lateral: 9.2 LV  SV:         43 LV SV Index:   24 LVOT Area:     3.14 cm  RIGHT VENTRICLE RV Basal diam:  3.80 cm RV S prime:     11.00 cm/s TAPSE (M-mode): 2.0 cm LEFT ATRIUM             Index        RIGHT ATRIUM           Index LA Vol (A2C):   62.4 ml 35.46 ml/m  RA Area:     13.20 cm LA Vol (A4C):   59.0 ml 33.53 ml/m  RA Volume:   36.60 ml  20.80 ml/m LA Biplane Vol: 62.8 ml 35.68 ml/m  AORTIC VALVE                     PULMONIC VALVE AV Area (Vmax):    1.55 cm      PV Vmax:       0.87 m/s AV Area (Vmean):   1.61 cm      PV Peak grad:  3.1 mmHg AV Area (VTI):     1.80 cm AV Vmax:           153.75 cm/s AV Vmean:          101.975 cm/s AV VTI:            0.239 m AV Peak Grad:      9.5 mmHg AV Mean Grad:      5.2 mmHg LVOT Vmax:         75.78  cm/s LVOT Vmean:        52.120 cm/s LVOT VTI:          0.137 m LVOT/AV VTI ratio: 0.57  AORTA Ao Root diam: 3.10 cm Ao Asc diam:  3.30 cm MITRAL VALVE                TRICUSPID VALVE MV Area (PHT): 4.57 cm     TR Peak grad:   32.3 mmHg MV Mean grad:  2.0 mmHg     TR Vmax:        284.00 cm/s MV Decel Time: 166 msec MV E velocity: 111.00 cm/s  SHUNTS MV A velocity: 35.00 cm/s   Systemic VTI:  0.14 m MV E/A ratio:  3.17         Systemic Diam: 2.00 cm Clearnce Hasten Electronically signed by Clearnce Hasten Signature Date/Time: 04/01/2023/10:06:25 AM    Final    DG FEMUR MIN 2 VIEWS LEFT Result Date: 03/31/2023 CLINICAL DATA:  Intraoperative fluoroscopy for femoral fixation. EXAM: LEFT FEMUR 2 VIEWS FLUOROSCOPY: Fluoroscopy Time:  83.8 seconds Radiation Exposure Index (if provided by the fluoroscopic device): 14.53 mGy Number of Acquired Spot Images: 7 COMPARISON:  Hip radiographs dated 03/30/2023. FINDINGS: Intraoperative fluoroscopy images demonstrate femoral intramedullary nail and femoral neck screw across an intertrochanteric fracture of the femur. The bones are in improved alignment. IMPRESSION: Intraoperative fluoroscopy for femoral fixation. Electronically Signed   By: Romona Curls  M.D.   On: 03/31/2023 13:59   DG C-Arm 1-60 Min-No Report Result Date: 03/31/2023 Fluoroscopy was utilized by the requesting physician.  No radiographic interpretation.        Scheduled Meds:  [START ON 04/02/2023] amLODipine  10 mg Oral Daily   docusate sodium  100 mg Oral BID   folic acid  3 mg Oral Daily   latanoprost  1 drop Both Eyes QHS   predniSONE  5 mg Oral QAC breakfast   rivaroxaban  15 mg Oral Q supper   sodium chloride flush  3 mL Intravenous Q12H   Continuous Infusions:   LOS: 2 days    Time spent: 35 minutes    Ramiro Harvest, MD [facility]   To contact the attending provider between 7A-7P or the covering provider during after hours 7P-7A, please log into the web site [email or web address] and access using universal [place] password for that web site. If you do not have the password, please call the hospital operator.  04/01/2023, 11:37 AM

## 2023-04-02 ENCOUNTER — Inpatient Hospital Stay (HOSPITAL_COMMUNITY)

## 2023-04-02 DIAGNOSIS — D62 Acute posthemorrhagic anemia: Secondary | ICD-10-CM

## 2023-04-02 DIAGNOSIS — N1831 Chronic kidney disease, stage 3a: Secondary | ICD-10-CM | POA: Diagnosis not present

## 2023-04-02 DIAGNOSIS — S72002P Fracture of unspecified part of neck of left femur, subsequent encounter for closed fracture with malunion: Secondary | ICD-10-CM | POA: Diagnosis not present

## 2023-04-02 DIAGNOSIS — Z7901 Long term (current) use of anticoagulants: Secondary | ICD-10-CM | POA: Diagnosis not present

## 2023-04-02 LAB — CBC
HCT: 23.1 % — ABNORMAL LOW (ref 39.0–52.0)
Hemoglobin: 7.7 g/dL — ABNORMAL LOW (ref 13.0–17.0)
MCH: 31 pg (ref 26.0–34.0)
MCHC: 33.3 g/dL (ref 30.0–36.0)
MCV: 93.1 fL (ref 80.0–100.0)
Platelets: 226 10*3/uL (ref 150–400)
RBC: 2.48 MIL/uL — ABNORMAL LOW (ref 4.22–5.81)
RDW: 19 % — ABNORMAL HIGH (ref 11.5–15.5)
WBC: 13.4 10*3/uL — ABNORMAL HIGH (ref 4.0–10.5)
nRBC: 0 % (ref 0.0–0.2)

## 2023-04-02 LAB — BASIC METABOLIC PANEL
Anion gap: 7 (ref 5–15)
BUN: 35 mg/dL — ABNORMAL HIGH (ref 8–23)
CO2: 23 mmol/L (ref 22–32)
Calcium: 8 mg/dL — ABNORMAL LOW (ref 8.9–10.3)
Chloride: 103 mmol/L (ref 98–111)
Creatinine, Ser: 0.97 mg/dL (ref 0.61–1.24)
GFR, Estimated: >60 mL/min (ref >60)
Glucose, Bld: 108 mg/dL — ABNORMAL HIGH (ref 70–99)
Potassium: 3.4 mmol/L — ABNORMAL LOW (ref 3.5–5.1)
Sodium: 133 mmol/L — ABNORMAL LOW (ref 135–145)

## 2023-04-02 LAB — PREPARE RBC (CROSSMATCH)

## 2023-04-02 LAB — HEMOGLOBIN AND HEMATOCRIT, BLOOD
HCT: 25.3 % — ABNORMAL LOW (ref 39.0–52.0)
Hemoglobin: 8.8 g/dL — ABNORMAL LOW (ref 13.0–17.0)

## 2023-04-02 LAB — ABO/RH: ABO/RH(D): B POS

## 2023-04-02 MED ORDER — LIDOCAINE VISCOUS HCL 2 % MT SOLN
15.0000 mL | Freq: Once | OROMUCOSAL | Status: DC
  Filled 2023-04-02: qty 15

## 2023-04-02 MED ORDER — POTASSIUM CHLORIDE CRYS ER 10 MEQ PO TBCR
40.0000 meq | EXTENDED_RELEASE_TABLET | Freq: Once | ORAL | Status: AC
  Administered 2023-04-02: 40 meq via ORAL
  Filled 2023-04-02: qty 4

## 2023-04-02 MED ORDER — ACETAMINOPHEN 325 MG PO TABS
650.0000 mg | ORAL_TABLET | Freq: Once | ORAL | Status: AC
  Administered 2023-04-02: 650 mg via ORAL
  Filled 2023-04-02: qty 2

## 2023-04-02 MED ORDER — SODIUM CHLORIDE 0.9% IV SOLUTION: Freq: Once | INTRAVENOUS | Status: AC

## 2023-04-02 MED ORDER — ALUM & MAG HYDROXIDE-SIMETH 200-200-20 MG/5ML PO SUSP: 30.0000 mL | Freq: Once | ORAL | Status: DC

## 2023-04-02 MED ORDER — PANTOPRAZOLE SODIUM 40 MG PO TBEC
40.0000 mg | DELAYED_RELEASE_TABLET | Freq: Every day | ORAL | Status: DC
  Administered 2023-04-03: 40 mg via ORAL
  Filled 2023-04-02: qty 1

## 2023-04-02 NOTE — Progress Notes (Addendum)
 PROGRESS NOTE    Cory Gonzalez  RUE:[id number] DOB: 11-Jun-1929 DOA: 03/30/2023 PCP: [facility]    Chief Complaint  Patient presents with   Fall    Brief Narrative:  Patient 89 year old gentleman history of A-fib on Xarelto, chronic diastolic heart failure, hypertension, rheumatoid arthritis presented to the ED after a fall.  Patient noted last dose of Xarelto was 03/28/2023.  Plain films of the left hip and pelvis done with an acute commuted intertrochanteric fracture of the left femur.  Orthopedics consulted.  Patient underwent IM nail per orthopedics on 03/31/2023.  Patient also noted to have an episode of bradycardia in the ED and seen in consultation by cardiology.   Assessment & Plan:   Principal Problem:   Closed left hip fracture (HCC) Active Problems:   Acute postoperative anemia due to expected blood loss   HTN (hypertension)   Rheumatoid arthritis (HCC)   Atrial fibrillation, chronic (HCC)   CKD stage 3a, GFR 45-59 ml/min (HCC)   BPH (benign prostatic hyperplasia)   Current use of long term anticoagulation   Aortic valve stenosis  #1 left hip intertrochanteric proximal femur fracture -Secondary to mechanical fall. -Patient on presentation underwent plain films of the left hip and pelvis which showed an acute commuted intertrochanteric fracture of the left femur. -Patient seen in consultation by orthopedics and patient underwent IM nail, 03/31/2023. -PT/OT. -Pain management per orthopedics. -Orthopedics following appreciate input and recommendations.  2 postop acute blood loss anemia -Hemoglobin trending down currently at 7.7 today from 10.3 on admission. -Patient with no overt bleeding. -Transfuse 1 unit PRBCs. -Follow H&H. -Transfusion threshold hemoglobin < 8.  3.  Bradycardia -Patient seen in consultation by cardiology who felt may have been related to choking or hypoxia as bradycardia improved after coughing and clearing his airway. -Repeat 2D  echo with a EF of 60 to 65%, NWMA, mildly reduced right ventricular systolic function, mild AVR, aortic valve sclerosis/calcification is present without any evidence of aortic stenosis. -Was being followed by cardiology who have signed off as of 04/01/2023.  4.  Mild aortic regurgitation. -Known history of cardiac murmur for several years. -2D echo from 2021 with mild AS. -Repeat 2D echo with a EF of 60 to 65%, NWMA, mildly reduced right ventricular systolic function, mild AVR, aortic valve sclerosis/calcification is present without any evidence of aortic stenosis.  -Cardiology was following but is signed off as of 04/01/2023.  5.  Chronic A-fib -Rate controlled  -Xarelto for anticoagulation.   -Cardiology was following.   6.  CKD 3a -Stable.  7.  Hypertension -BP noted to be soft with systolics of 105, on 04/01/2023, and as such antihypertensive medications of Norvasc, lisinopril, HCTZ held. -BP improved.  -Monitor and start reintroducing antihypertensive medication if blood pressure continues to rise.   8.  Rheumatoid arthritis -Patient noted to be on infliximab every 8 weeks, methotrexate every Friday (last dose 03/29/2023). -Continue prednisone.  9.  Wheezing -Patient with prior history of tobacco use. -Improved wheezing.   -Continue Dulera, DuoNebs 3 times daily.  10.  Hypokalemia -Replete.   DVT prophylaxis: Xarelto.  Code Status: DNR limited Family Communication: Updated wife at bedside. Disposition: SNF once hemoglobin is stabilized and patient cleared by orthopedics.  Status is: Inpatient Remains inpatient appropriate because: Severity of illness   Consultants:  Orthopedics: Dr. Ave Filter 03/30/2023 Cardiology: Dr. Rennis Golden 03/30/2023  Procedures:  2D echo 04/01/2023 CT head CT C-spine 03/30/2023 Plain films of the left hip and pelvis 03/30/2023 Chest x-ray 03/30/2023. IM  nail left femur 03/31/2023 per orthopedics: Dr. Ave Filter Transfuse 1 unit PRBCs 04/02/2023  Antimicrobials:   Anti-infectives (From admission, onward)    Start     Dose/Rate Route Frequency Ordered Stop   03/31/23 0822  ceFAZolin (ANCEF) 2-4 GM/100ML-% IVPB       Note to Pharmacy: Aquilla Hacker M: cabinet override      03/31/23 0822 03/31/23 0934   03/31/23 0600  ceFAZolin (ANCEF) IVPB 2g/100 mL premix        2 g 200 mL/hr over 30 Minutes Intravenous On call to O.R. 03/31/23 0130 03/31/23 0915         Subjective: Patient laying in bed.  More alert today.  Denies any chest pain or shortness of breath.  Wife at bedside.      Objective: Vitals:   04/02/23 1318 04/02/23 1510 04/02/23 1517 04/02/23 1604  BP: (!) 135/92  122/84 (!) 140/82  Pulse: 81  77 80  Resp: 16  18 16   Temp: 99.3 F (37.4 C)  99.5 F (37.5 C) 98.1 F (36.7 C)  TempSrc: Axillary  Oral Axillary  SpO2: 100% 100% 100% 97%  Weight:      Height:        Intake/Output Summary (Last 24 hours) at 04/02/2023 1613 Last data filed at 04/02/2023 1604 Gross per 24 hour  Intake 315 ml  Output 200 ml  Net 115 ml   Filed Weights   03/30/23 0848 03/30/23 1447 03/31/23 0819  Weight: 63.5 kg 68.9 kg 68.9 kg    Examination:  General exam: Alert.  NAD.  Respiratory system: Lungs clear to auscultation bilaterally.  No wheezes, no crackles, no rhonchi.  Cardiovascular system: Irregularly irregular with 3/6 SEM in the left lower sternal border and left upper sternal border with radiation to the carotids.  No JVD.  No pitting lower extremity edema.  Gastrointestinal system: Abdomen soft, nontender, nondistended, positive bowel sounds.  No rebound.  No guarding.  Central nervous system: Alert and oriented. No focal neurological deficits. Extremities: Left lower extremity with dressing in place.  Skin: No rashes, lesions or ulcers Psychiatry: Judgement and insight unable to assess.  Mood and affect unable to assess.      Data Reviewed: I have personally reviewed following labs and imaging studies  CBC: Recent Labs  Lab  03/30/23 0857 03/31/23 0502 04/01/23 0622 04/02/23 0756  WBC 9.7 13.6* 14.7* 13.4*  HGB 10.3* 9.3* 7.7* 7.7*  HCT 32.0* 27.6* 22.8* 23.1*  MCV 95.5 92.9 92.3 93.1  PLT 256 239 210 226    Basic Metabolic Panel: Recent Labs  Lab 03/30/23 0857 03/31/23 0502 04/01/23 0622 04/02/23 0756  NA 136 136 133* 133*  K 4.2 3.8 3.9 3.4*  CL 103 98 100 103  CO2 22 21* 21* 23  GLUCOSE 150* 159* 117* 108*  BUN 21 22 33* 35*  CREATININE 1.17 1.11 1.19 0.97  CALCIUM 8.7* 8.8* 8.3* 8.0*  MG  --   --  2.0  --     GFR: Estimated Creatinine Clearance: 43.1 mL/min (by C-G formula based on SCr of 0.97 mg/dL).  Liver Function Tests: Recent Labs  Lab 03/30/23 0857  AST 31  ALT 22  ALKPHOS 61  BILITOT 0.7  PROT 5.9*  ALBUMIN 3.3*    CBG: No results for input(s): "GLUCAP" in the last 168 hours.   Recent Results (from the past 240 hours)  Surgical PCR screen     Status: None   Collection Time: 03/30/23  4:08 PM  Specimen: Nasal Mucosa; Nasal Swab  Result Value Ref Range Status   MRSA, PCR NEGATIVE NEGATIVE Final   Staphylococcus aureus NEGATIVE NEGATIVE Final    Comment: (NOTE) The Xpert SA Assay (FDA approved for NASAL specimens in patients 65 years of age and older), is one component of a comprehensive surveillance program. It is not intended to diagnose infection nor to guide or monitor treatment. Performed at [facility], [address], [place], [place] [address]   Urine Culture (for pregnant, neutropenic or urologic patients or patients with an indwelling urinary catheter)     Status: None   Collection Time: 03/31/23  8:20 AM   Specimen: Urine, Clean Catch  Result Value Ref Range Status   Specimen Description URINE, CLEAN CATCH  Final   Special Requests NONE  Final   Culture   Final    NO GROWTH Performed at [facility], [address] [place], [place] [address]    Report Status 04/01/2023 FINAL  Final         Radiology Studies: DG CHEST PORT  1 VIEW Result Date: 04/02/2023 CLINICAL DATA:  Leukocytosis EXAM: PORTABLE CHEST - 1 VIEW COMPARISON:  03/30/2023 FINDINGS: Low lung volumes. Persistent left retrocardiac opacity. Increasing patchy airspace opacities at the right lung base . Heart size and mediastinal contours are within normal limits. Aortic Atherosclerosis (ICD10-170.0). Blunting of right lateral costophrenic angle. Visualized bones unremarkable. IMPRESSION: Low volumes with bibasilar opacities Electronically Signed   By: Corlis Leak M.D.   On: 04/02/2023 13:14   ECHOCARDIOGRAM COMPLETE Result Date: 04/01/2023    ECHOCARDIOGRAM REPORT   Patient Name:   Cory Gonzalez Date of Exam: 04/01/2023 Medical Rec #:  [id number]   Height:       65.0 in Accession #:    [id number]  Weight:       151.9 lb Date of Birth:  1929-12-12   BSA:          1.760 m Patient Age:    89 years    BP:           105/58 mmHg Patient Gender: M           HR:           88 bpm. Exam Location:  Inpatient Procedure: 2D Echo, Cardiac Doppler and Color Doppler (Both Spectral and Color            Flow Doppler were utilized during procedure). Indications:    Aortic stenosis; Mitral Valve regurg  History:        Patient has prior history of Echocardiogram examinations, most                 recent 07/30/2019. [id number], Aortic Valve Disease and Mitral Valve                 Disease; Risk Factors:Current Smoker and Hypertension.  Sonographer:    Dondra Prader RVT RCS Referring Phys: [phone] AMBER [place] IMPRESSIONS  1. Left ventricular ejection fraction, by estimation, is 60 to 65%. The left ventricle has normal function. The left ventricle has no regional wall motion abnormalities. Left ventricular diastolic function could not be evaluated. Elevated left atrial pressure.  2. Right ventricular systolic function is mildly reduced. The right ventricular size is normal. There is mildly elevated pulmonary artery systolic pressure. The estimated right ventricular systolic pressure is 40.3 mmHg.  3. The  mitral valve is normal in structure. Mild mitral valve regurgitation. No evidence of mitral stenosis.  4. The aortic valve is normal in structure. There is moderate calcification of the aortic valve. There is moderate thickening of the aortic valve. Aortic valve regurgitation is mild. Aortic valve sclerosis/calcification is present, without any evidence of aortic stenosis. Aortic valve area, by VTI measures 1.80 cm. Aortic valve mean gradient measures 5.2 mmHg. Aortic valve Vmax measures 1.54 m/s. FINDINGS  Left Ventricle: Left ventricular ejection fraction, by estimation, is 60 to 65%. The left ventricle has normal function. The left ventricle has no regional wall motion abnormalities. The left ventricular internal cavity size was normal in size. There is  no left ventricular hypertrophy. Left ventricular diastolic function could not be evaluated due to mitral annular calcification (moderate or greater). Left ventricular diastolic function could not be evaluated. Elevated left atrial pressure. Right Ventricle: The right ventricular size is normal. No increase in right ventricular wall thickness. Right ventricular systolic function is mildly reduced. There is mildly elevated pulmonary artery systolic pressure. The tricuspid regurgitant velocity  is 2.84 m/s, and with an assumed right atrial pressure of 8 mmHg, the estimated right ventricular systolic pressure is 40.3 mmHg. Left Atrium: Left atrial size was normal in size. Right Atrium: Right atrial size was normal in size. Pericardium: There is no evidence of pericardial effusion. Mitral Valve: The mitral valve is normal in structure. Mild to moderate mitral annular calcification. Mild mitral valve regurgitation. No evidence of mitral valve stenosis. The mean mitral valve gradient is 2.0 mmHg. Tricuspid Valve: The tricuspid valve is normal in structure. Tricuspid valve regurgitation is not demonstrated. No evidence of tricuspid stenosis. Aortic Valve: The aortic  valve is normal in structure. There is moderate calcification of the aortic valve. There is moderate thickening of the aortic valve. Aortic valve regurgitation is mild. Aortic valve sclerosis/calcification is present, without any  evidence of aortic stenosis. Aortic valve mean gradient measures 5.2 mmHg. Aortic valve peak gradient measures 9.5 mmHg. Aortic valve area, by VTI measures 1.80 cm. Pulmonic Valve: The pulmonic valve was normal in structure. Pulmonic valve regurgitation is trivial. No evidence of pulmonic stenosis. Aorta: The aortic root is normal in size and structure. Venous: The inferior vena cava was not well visualized. IAS/Shunts: No atrial level shunt detected by color flow Doppler.  LEFT VENTRICLE PLAX 2D LVIDd:         4.00 cm   Diastology LVIDs:         2.80 cm   LV e' medial:    6.75 cm/s LV PW:         1.20 cm   LV E/e' medial:  16.4 LV IVS:        1.40 cm   LV e' lateral:   12.10 cm/s LVOT diam:     2.00 cm   LV E/e' lateral: 9.2 LV SV:         43 LV SV Index:   24 LVOT Area:     3.14 cm  RIGHT VENTRICLE RV Basal diam:  3.80 cm RV S prime:     11.00 cm/s TAPSE (M-mode): 2.0 cm LEFT ATRIUM             Index        RIGHT ATRIUM           Index LA Vol (A2C):   62.4 ml 35.46 ml/m  RA Area:     13.20 cm LA Vol (A4C):   59.0 ml 33.53 ml/m  RA Volume:   36.60 ml  20.80 ml/m LA Biplane Vol: 62.8  ml 35.68 ml/m  AORTIC VALVE                     PULMONIC VALVE AV Area (Vmax):    1.55 cm      PV Vmax:       0.87 m/s AV Area (Vmean):   1.61 cm      PV Peak grad:  3.1 mmHg AV Area (VTI):     1.80 cm AV Vmax:           153.75 cm/s AV Vmean:          101.975 cm/s AV VTI:            0.239 m AV Peak Grad:      9.5 mmHg AV Mean Grad:      5.2 mmHg LVOT Vmax:         75.78 cm/s LVOT Vmean:        52.120 cm/s LVOT VTI:          0.137 m LVOT/AV VTI ratio: 0.57  AORTA Ao Root diam: 3.10 cm Ao Asc diam:  3.30 cm MITRAL VALVE                TRICUSPID VALVE MV Area (PHT): 4.57 cm     TR Peak grad:   32.3  mmHg MV Mean grad:  2.0 mmHg     TR Vmax:        284.00 cm/s MV Decel Time: 166 msec MV E velocity: 111.00 cm/s  SHUNTS MV A velocity: 35.00 cm/s   Systemic VTI:  0.14 m MV E/A ratio:  3.17         Systemic Diam: 2.00 cm Clearnce Hasten Electronically signed by Clearnce Hasten Signature Date/Time: 04/01/2023/10:06:25 AM    Final         Scheduled Meds:  alum & mag hydroxide-simeth  30 mL Oral Once   And   lidocaine  15 mL Oral Once   docusate sodium  100 mg Oral BID   folic acid  3 mg Oral Daily   ipratropium-albuterol  3 mL Nebulization TID   latanoprost  1 drop Both Eyes QHS   mometasone-formoterol  2 puff Inhalation BID   pantoprazole  40 mg Oral Q0600   predniSONE  5 mg Oral QAC breakfast   rivaroxaban  15 mg Oral Q supper   sodium chloride flush  3 mL Intravenous Q12H   Continuous Infusions:   LOS: 3 days    Time spent: 35 minutes    Ramiro Harvest, MD [facility]   To contact the attending provider between 7A-7P or the covering provider during after hours 7P-7A, please log into the web site [email or web address] and access using universal [place] password for that web site. If you do not have the password, please call the hospital operator.  04/02/2023, 4:13 PM

## 2023-04-02 NOTE — Progress Notes (Signed)
 Physical Therapy Treatment Patient Details Name: Cory Gonzalez MRN: [id number] DOB: [birth year] Today's Date: 04/02/2023   History of Present Illness Deeric Cruise is a 89 y.o. male with medical history significant of A-fib on Xarelto, chronic diastolic heart failure, hypertension, rheumatoid arthritis who presents after a fall at home.    PT Comments  Continuing work on functional mobility and activity tolerance;  Session focused on functional transfers as well as initiating therapeutic exercise; Shows better ability to active L quadricep for knee extension in sitting: LAQ as well as quad setting; Still with L knee buckle in standing, but not as significant as yesterday's PT session; Opted to keep upright activity to  stand pivot because of noted Hgb drop today; Modest, but present, progress; Patient will benefit from continued inpatient follow up therapy, <3 hours/day     If plan is discharge home, recommend the following: A lot of help with walking and/or transfers;A lot of help with bathing/dressing/bathroom;Help with stairs or ramp for entrance   Can travel by private vehicle     No  Equipment Recommendations  Rolling walker (2 wheels);BSC/3in1    Recommendations for Other Services       Precautions / Restrictions Precautions Precautions: Fall;Other (comment) (watch O2 sats on room air) Recall of Precautions/Restrictions: Impaired Restrictions LLE Weight Bearing Per Provider Order: Weight bearing as tolerated Other Position/Activity Restrictions: Noting O2 sat drop on room air to high 80s; restarted supplemental O2 2L and sats incr to mid 90s     Mobility  Bed Mobility Overal bed mobility: Needs Assistance Bed Mobility: Supine to Sit     Supine to sit: +2 for physical assistance, Mod assist     General bed mobility comments: Moda assist to elevate trunk to sit    Transfers Overall transfer level: Needs assistance Equipment used: Rolling walker (2 wheels) Transfers: Sit  to/from Stand, Bed to chair/wheelchair/BSC Sit to Stand: Mod assist, +2 physical assistance (Heavy mod) Stand pivot transfers: +2 physical assistance, Mod assist, Max assist         General transfer comment: Needs 2 person assist; Heavy mod assist to power up to stand; performed stand pivot transfer with UE support on RW; still with difficulty in LLE stance -- hip and knee buckle, requiring Max assist to stablize    Ambulation/Gait                   Stairs             Wheelchair Mobility     Tilt Bed    Modified Rankin (Stroke Patients Only)       Balance     Sitting balance-Leahy Scale: Fair       Standing balance-Leahy Scale: Poor                              Communication Communication Communication: Impaired Factors Affecting Communication: Hearing impaired  Cognition Arousal: Lethargic Behavior During Therapy: WFL for tasks assessed/performed   PT - Cognitive impairments: Difficult to assess, Attention Difficult to assess due to: Hard of hearing/deaf                     PT - Cognition Comments: Able to attend longer to exercises than previous session Following commands: Intact      Cueing Cueing Techniques: Verbal cues, Gestural cues, Tactile cues, Visual cues  Exercises General Exercises - Lower Extremity Quad Sets: AROM, Both, 10 reps Long  Arc Quad: AROM, Left, 5 reps (Not quite getting to full extension against gravity) Heel Slides: AROM, Left, 10 reps Other Exercises Other Exercises: Incentive spirometry, 5 reps    General Comments General comments (skin integrity, edema, etc.): Better ability to attend to therex and commands related to functional mobility, though still sleepy throughout session      Pertinent Vitals/Pain Pain Assessment Pain Assessment: Faces Faces Pain Scale: Hurts little more Pain Location: LLE with movement/weight bearing; subsides quickly Pain Descriptors / Indicators: Grimacing Pain  Intervention(s): Monitored during session    Home Living                          Prior Function            PT Goals (current goals can now be found in the care plan section) Acute Rehab PT Goals Patient Stated Goal: Did not state, but agreeable to gettin gOOB PT Goal Formulation: With patient/family Time For Goal Achievement: 04/15/23 Potential to Achieve Goals: Good Progress towards PT goals: Progressing toward goals    Frequency    Min 1X/week      PT Plan      Co-evaluation              AM-PAC PT "6 Clicks" Mobility   Outcome Measure  Help needed turning from your back to your side while in a flat bed without using bedrails?: A Lot Help needed moving from lying on your back to sitting on the side of a flat bed without using bedrails?: A Lot Help needed moving to and from a bed to a chair (including a wheelchair)?: Total Help needed standing up from a chair using your arms (e.g., wheelchair or bedside chair)?: Total Help needed to walk in hospital room?: Total Help needed climbing 3-5 steps with a railing? : Total 6 Click Score: 8    End of Session Equipment Utilized During Treatment: Gait belt;Oxygen Activity Tolerance: Patient tolerated treatment well Patient left: in chair;with call bell/phone within reach;with chair alarm set;with family/visitor present Nurse Communication: Mobility status PT Visit Diagnosis: Unsteadiness on feet (R26.81);Pain;History of falling (Z91.81) Pain - Right/Left: Left Pain - part of body: Hip     Time: 0835-0900 PT Time Calculation (min) (ACUTE ONLY): 25 min  Charges:    $Therapeutic Activity: 23-37 mins PT General Charges $$ ACUTE PT VISIT: 1 Visit                     Van Clines, PT  Acute Rehabilitation Services Office [phone] Secure Chat welcomed    Levi Aland 04/02/2023, 9:53 AM

## 2023-04-02 NOTE — [place] FL2 (Signed)
 [place] MEDICAID FL2 LEVEL OF CARE FORM     IDENTIFICATION  Patient Name: Cory Gonzalez Birthdate: 21-Apr-1929 Sex: male Admission Date (Current Location): 03/30/2023  [facility] and [place] Number:  Producer, television/film/video and Address:  [facility] [place], [address] [place], [place] [address]      Provider Number: [id number]  Attending Physician Name and Address:  Rodolph Bong, MD  Relative Name and Phone Number:  Romanoff,[name] Spouse [phone]  [phone]    Current Level of Care: Hospital Recommended Level of Care: Skilled Nursing Facility Prior Approval Number:    Date Approved/Denied:   PASRR Number: [id number]  Discharge Plan: SNF    Current Diagnoses: Patient Active Problem List   Diagnosis Date Noted   Acute postoperative anemia due to expected blood loss 04/02/2023   Current use of long term anticoagulation 03/31/2023   Aortic valve stenosis 03/31/2023   Closed left hip fracture (HCC) 03/30/2023   Atrial fibrillation, chronic (HCC) 03/30/2023   CKD stage 3a, GFR 45-59 ml/min (HCC) 03/30/2023   BPH (benign prostatic hyperplasia) 03/30/2023   FUO (fever of unknown origin) 07/27/2019   Tick bite 07/27/2019   Pneumothorax on right 09/19/2017   Olecranon fracture 09/18/2017   Blepharitis of upper and lower eyelids of both eyes 04/19/2016   Pseudophakia of both eyes 04/19/2016   TB lung, latent 06/05/2011   HTN (hypertension) 06/05/2011   Ocular hypertension, 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   Rheumatoid arthritis (HCC) 02/16/2004    Orientation RESPIRATION BLADDER Height & Weight     Self, Time  Normal Continent Weight: 151 lb 14.4 oz (68.9 kg) Height:  5\' 5"  (165.1 cm)  BEHAVIORAL SYMPTOMS/MOOD NEUROLOGICAL BOWEL NUTRITION STATUS      Continent Diet (see discharge summary)  AMBULATORY STATUS COMMUNICATION OF NEEDS Skin   Extensive Assist Verbally Surgical wounds                       Personal Care Assistance Level of Assistance   Bathing, Feeding, Dressing Bathing Assistance: Maximum assistance Feeding assistance: Limited assistance Dressing Assistance: Maximum assistance     Functional Limitations Info  Sight, Hearing, Speech Sight Info: Adequate Hearing Info: Impaired Speech Info: Adequate    SPECIAL CARE FACTORS FREQUENCY  PT (By licensed PT), OT (By licensed OT)     PT Frequency: 5x week OT Frequency: 5x week            Contractures Contractures Info: Not present    Additional Factors Info  Code Status, Allergies Code Status Info: DNR Allergies Info: NKA           Current Medications (04/02/2023):  This is the current hospital active medication list Current Facility-Administered Medications  Medication Dose Route Frequency Provider Last Rate Last Admin   acetaminophen (TYLENOL) tablet 650 mg  650 mg Oral Q6H PRN Porterfield, Amber, PA-C       alum & mag hydroxide-simeth (MAALOX/MYLANTA) 200-200-20 MG/5ML suspension 30 mL  30 mL Oral Once Rodolph Bong, MD       And   lidocaine (XYLOCAINE) 2 % viscous mouth solution 15 mL  15 mL Oral Once Rodolph Bong, MD       docusate sodium (COLACE) capsule 100 mg  100 mg Oral BID Porterfield, Amber, PA-C   100 mg at 04/02/23 0800   fentaNYL (SUBLIMAZE) injection 12.5 mcg  12.5 mcg Intravenous Q2H PRN Porterfield, Amber, PA-C   12.5 mcg at 04/01/23 0806   folic acid (FOLVITE)  tablet 3 mg  3 mg Oral Daily Porterfield, Amber, PA-C   3 mg at 04/02/23 0800   HYDROcodone-acetaminophen (NORCO/VICODIN) 5-325 MG per tablet 1 tablet  1 tablet Oral Q6H PRN Porterfield, Amber, PA-C   1 tablet at 04/02/23 0757   ipratropium-albuterol (DUONEB) 0.5-2.5 (3) MG/3ML nebulizer solution 3 mL  3 mL Nebulization TID Jones Broom, MD   3 mL at 04/01/23 2017   latanoprost (XALATAN) 0.005 % ophthalmic solution 1 drop  1 drop Both Eyes QHS Porterfield, Amber, PA-C   1 drop at 04/01/23 2133   mometasone-formoterol (DULERA) 200-5 MCG/ACT inhaler 2 puff  2 puff  Inhalation BID Rodolph Bong, MD   2 puff at 04/02/23 0822   ondansetron (ZOFRAN) tablet 4 mg  4 mg Oral Q6H PRN Porterfield, Amber, PA-C       Or   ondansetron (ZOFRAN) injection 4 mg  4 mg Intravenous Q6H PRN Porterfield, Amber, PA-C       Oral care mouth rinse  15 mL Mouth Rinse PRN Porterfield, Amber, PA-C       pantoprazole (PROTONIX) EC tablet 40 mg  40 mg Oral Q0600 Rodolph Bong, MD       predniSONE (DELTASONE) tablet 5 mg  5 mg Oral QAC breakfast Porterfield, Amber, PA-C   5 mg at 04/02/23 0800   Rivaroxaban (XARELTO) tablet 15 mg  15 mg Oral Q supper Porterfield, Amber, PA-C   15 mg at 04/01/23 1854   sodium chloride flush (NS) 0.9 % injection 3 mL  3 mL Intravenous Q12H Porterfield, Amber, PA-C   3 mL at 04/02/23 0800     Discharge Medications: Please see discharge summary for a list of discharge medications.  Relevant Imaging Results:  Relevant Lab Results:   Additional Information SS#[id number]  Lorri Frederick, [place]

## 2023-04-02 NOTE — Progress Notes (Signed)
 PATIENT ID: Cory Gonzalez  MRN: [id number]  DOB/AGE:  Jan 28, 1930 / 89 y.o.  2 Days Post-Op Procedure(s) (LRB): INTRAMEDULLARY (IM) NAIL INTERTROCHANTERIC (Left)  Subjective: Patient is not very communicative. Wife reports that he "seems ok" but does not have much of an appetite.   Objective: Vital signs in last 24 hours: Temp:  [98.2 F (36.8 C)-98.6 F (37 C)] 98.5 F (36.9 C) (03/04 0805) Pulse Rate:  [80-106] 93 (03/04 0805) Resp:  [15-17] 16 (03/04 0805) BP: (107-121)/(65-109) 107/74 (03/04 0805) SpO2:  [90 %-100 %] 92 % (03/04 0805)  Intake/Output from previous day: 03/03 0701 - 03/04 0700 In: 120 [P.O.:120] Out: 320 [Urine:320]   Recent Labs    03/31/23 0502 04/01/23 0622 04/02/23 0756  HGB 9.3* 7.7* 7.7*   Recent Labs    04/01/23 0622 04/02/23 0756  WBC 14.7* 13.4*  RBC 2.47* 2.48*  HCT 22.8* 23.1*  PLT 210 226   Recent Labs    04/01/23 0622 04/02/23 0756  NA 133* 133*  K 3.9 3.4*  CL 100 103  CO2 21* 23  BUN 33* 35*  CREATININE 1.19 0.97  GLUCOSE 117* 108*  CALCIUM 8.3* 8.0*    Physical Exam: Sensation intact distally Intact pulses distally Dorsiflexion/Plantar flexion intact Incision: dressing C/D/I No cellulitis present Compartment soft  Assessment/Plan: 2 Days Post-Op Procedure(s) (LRB): INTRAMEDULLARY (IM) NAIL INTERTROCHANTERIC (Left)    Advance diet Up with therapy Discharge to SNF when stable and beds arrangements made Weight Bearing as Tolerated (WBAT) VTE prophylaxis: resume Xarelto   Awaiting social work consult. Patient will need continued PT at rehab. Dressing change PRN until Sunday 3/9 at which point band-aids will be sufficient and incisions can get wet. Follow up in office on 3/14. Rx for pain on chart.   Jamisyn Langer L. Porterfield, PA-C 04/02/2023, 10:40 AM

## 2023-04-02 NOTE — TOC Initial Note (Signed)
 Transition of Care [facility]) - Initial/Assessment Note    Patient Details  Name: Cory Gonzalez MRN: [id number] Date of Birth: Sep 11, 1929  [facility] CM/SW Contact:    Lorri Frederick, LCSW Phone Number: 04/02/2023, 3:11 PM  Clinical Narrative:       Pt oriented x2 and asleep.  CSW spoke with pt wife Cory Gonzalez and [name]'s sister Cory Gonzalez in room.  Discussed PT recommendation for SNF, wife is agreeable.  Medicare choice document provided, permission given to send out referral in hub.  Preferences: Cory Gonzalez, [facility].  Pt from home with wife, no current services.  Referral sent out in hub for SNF.  CSW reached out to the above facilities to review.             Expected Discharge Plan: Skilled Nursing Facility Barriers to Discharge: Continued Medical Work up, SNF Pending bed offer   Patient Goals and CMS Choice   CMS Medicare.gov Compare Post Acute Care list provided to:: Patient Represenative (must comment) (wife shirley) Choice offered to / list presented to : Spouse      Expected Discharge Plan and Services In-house Referral: Clinical Social Work   Post Acute Care Choice: Skilled Nursing Facility Living arrangements for the past 2 months: Single Family Home                                      Prior Living Arrangements/Services Living arrangements for the past 2 months: Single Family Home Lives with:: Spouse Patient language and need for interpreter reviewed:: No (pt asleep)        Need for Family Participation in Patient Care: Yes (Comment) Care giver support system in place?: Yes (comment) Current home services: Other (comment) (none) Criminal Activity/Legal Involvement Pertinent to Current Situation/Hospitalization: No - Comment as needed  Activities of Daily Living   ADL Screening (condition at time of admission) Independently performs ADLs?: No Does the patient have a NEW difficulty with  bathing/dressing/toileting/self-feeding that is expected to last >3 days?: Yes (Initiates electronic notice to provider for possible OT consult) Does the patient have a NEW difficulty with getting in/out of bed, walking, or climbing stairs that is expected to last >3 days?: Yes (Initiates electronic notice to provider for possible PT consult) Does the patient have a NEW difficulty with communication that is expected to last >3 days?: No Is the patient deaf or have difficulty hearing?: Yes Does the patient have difficulty seeing, even when wearing glasses/contacts?: No Does the patient have difficulty concentrating, remembering, or making decisions?: Yes  Permission Sought/Granted                  Emotional Assessment Appearance:: Appears stated age Attitude/Demeanor/Rapport: Unable to Assess Affect (typically observed): Unable to Assess Orientation: : Oriented to Self, Oriented to  Time      Admission diagnosis:  Current use of long term anticoagulation [Z79.01] Closed left hip fracture (HCC) [S72.002A] Closed fracture of left hip, initial encounter (HCC) [S72.002A] Fall from slip, trip, or stumble, initial encounter [W01.0XXA] Contusion of head, initial encounter [S00.93XA] Skin tear of left upper arm without complication, initial encounter [S41.112A] Patient Active Problem List   Diagnosis Date Noted   Acute postoperative anemia due to expected blood loss 04/02/2023   Current use of long term anticoagulation 03/31/2023   Aortic valve stenosis 03/31/2023   Closed left hip fracture (HCC) 03/30/2023   Atrial fibrillation, chronic (  HCC) 03/30/2023   CKD stage 3a, GFR 45-59 ml/min (HCC) 03/30/2023   BPH (benign prostatic hyperplasia) 03/30/2023   FUO (fever of unknown origin) 07/27/2019   Tick bite 07/27/2019   Pneumothorax on right 09/19/2017   Olecranon fracture 09/18/2017   Blepharitis of upper and lower eyelids of both eyes 04/19/2016   Pseudophakia of both eyes 04/19/2016    TB lung, latent 06/05/2011   HTN (hypertension) 06/05/2011   Ocular hypertension, 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   Rheumatoid arthritis (HCC) 02/16/2004   PCP:  [facility]:   [facility] - [place], [place] - [address] [place] [address] Phone: [phone] Fax: [phone]  [id number] - [place], [place] - [address] AT [facility] [address] [place] [address] Phone: [phone] Fax: [phone]     Social Drivers of Health (SDOH) Social History: SDOH Screenings   Food Insecurity: No Food Insecurity (03/30/2023)  Housing: Low Risk  (03/30/2023)  Transportation Needs: Unmet Transportation Needs (03/30/2023)  Utilities: Not At Risk (03/30/2023)  Depression (PHQ2-9): Low Risk  (09/16/2019)  Social Connections: Moderately Integrated (03/30/2023)  Tobacco Use: High Risk (03/31/2023)   SDOH Interventions:     Readmission Risk Interventions     No data to display

## 2023-04-02 NOTE — Care Management Important Message (Signed)
 Important Message  Patient Details  Name: Cory Gonzalez MRN: [id number] Date of Birth: 1929/12/09   Important Message Given:  Yes - Medicare IM     Sherilyn Banker 04/02/2023, 2:20 PM

## 2023-04-03 DIAGNOSIS — I110 Hypertensive heart disease with heart failure: Secondary | ICD-10-CM | POA: Diagnosis not present

## 2023-04-03 DIAGNOSIS — M069 Rheumatoid arthritis, unspecified: Secondary | ICD-10-CM | POA: Diagnosis not present

## 2023-04-03 DIAGNOSIS — R011 Cardiac murmur, unspecified: Secondary | ICD-10-CM | POA: Diagnosis not present

## 2023-04-03 DIAGNOSIS — R062 Wheezing: Secondary | ICD-10-CM | POA: Diagnosis not present

## 2023-04-03 DIAGNOSIS — Z9889 Other specified postprocedural states: Secondary | ICD-10-CM | POA: Diagnosis not present

## 2023-04-03 DIAGNOSIS — Z66 Do not resuscitate: Secondary | ICD-10-CM | POA: Diagnosis not present

## 2023-04-03 DIAGNOSIS — I130 Hypertensive heart and chronic kidney disease with heart failure and stage 1 through stage 4 chronic kidney disease, or unspecified chronic kidney disease: Secondary | ICD-10-CM | POA: Diagnosis not present

## 2023-04-03 DIAGNOSIS — Z7901 Long term (current) use of anticoagulants: Secondary | ICD-10-CM | POA: Diagnosis not present

## 2023-04-03 DIAGNOSIS — K5900 Constipation, unspecified: Secondary | ICD-10-CM | POA: Diagnosis not present

## 2023-04-03 DIAGNOSIS — Z28311 Partially vaccinated for covid-19: Secondary | ICD-10-CM | POA: Diagnosis not present

## 2023-04-03 DIAGNOSIS — S72002P Fracture of unspecified part of neck of left femur, subsequent encounter for closed fracture with malunion: Secondary | ICD-10-CM | POA: Diagnosis not present

## 2023-04-03 DIAGNOSIS — H6123 Impacted cerumen, bilateral: Secondary | ICD-10-CM | POA: Diagnosis not present

## 2023-04-03 DIAGNOSIS — S72142A Displaced intertrochanteric fracture of left femur, initial encounter for closed fracture: Secondary | ICD-10-CM | POA: Diagnosis not present

## 2023-04-03 DIAGNOSIS — Z4889 Encounter for other specified surgical aftercare: Secondary | ICD-10-CM | POA: Diagnosis not present

## 2023-04-03 DIAGNOSIS — Z969 Presence of functional implant, unspecified: Secondary | ICD-10-CM | POA: Diagnosis not present

## 2023-04-03 DIAGNOSIS — N1831 Chronic kidney disease, stage 3a: Secondary | ICD-10-CM | POA: Diagnosis not present

## 2023-04-03 DIAGNOSIS — S0093XD Contusion of unspecified part of head, subsequent encounter: Secondary | ICD-10-CM | POA: Diagnosis not present

## 2023-04-03 DIAGNOSIS — R0902 Hypoxemia: Secondary | ICD-10-CM | POA: Diagnosis not present

## 2023-04-03 DIAGNOSIS — R4182 Altered mental status, unspecified: Secondary | ICD-10-CM | POA: Diagnosis not present

## 2023-04-03 DIAGNOSIS — R0602 Shortness of breath: Secondary | ICD-10-CM | POA: Diagnosis not present

## 2023-04-03 DIAGNOSIS — I509 Heart failure, unspecified: Secondary | ICD-10-CM | POA: Diagnosis not present

## 2023-04-03 DIAGNOSIS — I350 Nonrheumatic aortic (valve) stenosis: Secondary | ICD-10-CM | POA: Diagnosis not present

## 2023-04-03 DIAGNOSIS — M129 Arthropathy, unspecified: Secondary | ICD-10-CM | POA: Diagnosis not present

## 2023-04-03 DIAGNOSIS — I5082 Biventricular heart failure: Secondary | ICD-10-CM | POA: Diagnosis not present

## 2023-04-03 DIAGNOSIS — M1611 Unilateral primary osteoarthritis, right hip: Secondary | ICD-10-CM | POA: Diagnosis not present

## 2023-04-03 DIAGNOSIS — Z299 Encounter for prophylactic measures, unspecified: Secondary | ICD-10-CM | POA: Diagnosis not present

## 2023-04-03 DIAGNOSIS — Z23 Encounter for immunization: Secondary | ICD-10-CM | POA: Diagnosis not present

## 2023-04-03 DIAGNOSIS — R001 Bradycardia, unspecified: Secondary | ICD-10-CM | POA: Diagnosis not present

## 2023-04-03 DIAGNOSIS — M85851 Other specified disorders of bone density and structure, right thigh: Secondary | ICD-10-CM | POA: Diagnosis not present

## 2023-04-03 DIAGNOSIS — I10 Essential (primary) hypertension: Secondary | ICD-10-CM | POA: Diagnosis not present

## 2023-04-03 DIAGNOSIS — R531 Weakness: Secondary | ICD-10-CM | POA: Diagnosis not present

## 2023-04-03 DIAGNOSIS — S72142D Displaced intertrochanteric fracture of left femur, subsequent encounter for closed fracture with routine healing: Secondary | ICD-10-CM | POA: Diagnosis not present

## 2023-04-03 DIAGNOSIS — J90 Pleural effusion, not elsewhere classified: Secondary | ICD-10-CM | POA: Diagnosis not present

## 2023-04-03 DIAGNOSIS — I351 Nonrheumatic aortic (valve) insufficiency: Secondary | ICD-10-CM | POA: Diagnosis not present

## 2023-04-03 DIAGNOSIS — I5032 Chronic diastolic (congestive) heart failure: Secondary | ICD-10-CM | POA: Diagnosis not present

## 2023-04-03 DIAGNOSIS — J9601 Acute respiratory failure with hypoxia: Secondary | ICD-10-CM | POA: Diagnosis not present

## 2023-04-03 DIAGNOSIS — I482 Chronic atrial fibrillation, unspecified: Secondary | ICD-10-CM | POA: Diagnosis not present

## 2023-04-03 DIAGNOSIS — U071 COVID-19: Secondary | ICD-10-CM | POA: Diagnosis not present

## 2023-04-03 DIAGNOSIS — R918 Other nonspecific abnormal finding of lung field: Secondary | ICD-10-CM | POA: Diagnosis not present

## 2023-04-03 DIAGNOSIS — R0689 Other abnormalities of breathing: Secondary | ICD-10-CM | POA: Diagnosis not present

## 2023-04-03 DIAGNOSIS — R000 Tachycardia, unspecified: Secondary | ICD-10-CM | POA: Diagnosis not present

## 2023-04-03 DIAGNOSIS — I469 Cardiac arrest, cause unspecified: Secondary | ICD-10-CM | POA: Diagnosis not present

## 2023-04-03 DIAGNOSIS — I4891 Unspecified atrial fibrillation: Secondary | ICD-10-CM | POA: Diagnosis not present

## 2023-04-03 DIAGNOSIS — Z9181 History of falling: Secondary | ICD-10-CM | POA: Diagnosis not present

## 2023-04-03 DIAGNOSIS — N400 Enlarged prostate without lower urinary tract symptoms: Secondary | ICD-10-CM | POA: Diagnosis not present

## 2023-04-03 DIAGNOSIS — F4320 Adjustment disorder, unspecified: Secondary | ICD-10-CM | POA: Diagnosis not present

## 2023-04-03 DIAGNOSIS — Z743 Need for continuous supervision: Secondary | ICD-10-CM | POA: Diagnosis not present

## 2023-04-03 DIAGNOSIS — E876 Hypokalemia: Secondary | ICD-10-CM | POA: Diagnosis not present

## 2023-04-03 DIAGNOSIS — I517 Cardiomegaly: Secondary | ICD-10-CM | POA: Diagnosis not present

## 2023-04-03 DIAGNOSIS — W19XXXA Unspecified fall, initial encounter: Secondary | ICD-10-CM | POA: Diagnosis not present

## 2023-04-03 DIAGNOSIS — D62 Acute posthemorrhagic anemia: Secondary | ICD-10-CM | POA: Diagnosis not present

## 2023-04-03 DIAGNOSIS — M25552 Pain in left hip: Secondary | ICD-10-CM | POA: Diagnosis not present

## 2023-04-03 LAB — CBC
HCT: 25.3 % — ABNORMAL LOW (ref 39.0–52.0)
Hemoglobin: 8.9 g/dL — ABNORMAL LOW (ref 13.0–17.0)
MCH: 31.8 pg (ref 26.0–34.0)
MCHC: 35.2 g/dL (ref 30.0–36.0)
MCV: 90.4 fL (ref 80.0–100.0)
Platelets: 217 10*3/uL (ref 150–400)
RBC: 2.8 MIL/uL — ABNORMAL LOW (ref 4.22–5.81)
RDW: 18.2 % — ABNORMAL HIGH (ref 11.5–15.5)
WBC: 10.4 10*3/uL (ref 4.0–10.5)
nRBC: 0.7 % — ABNORMAL HIGH (ref 0.0–0.2)

## 2023-04-03 LAB — BPAM RBC
Blood Product Expiration Date: 202503302359
ISSUE DATE / TIME: 202503041244
Unit Type and Rh: 7300

## 2023-04-03 LAB — TYPE AND SCREEN
ABO/RH(D): B POS
Antibody Screen: NEGATIVE
Unit division: 0

## 2023-04-03 LAB — BASIC METABOLIC PANEL
Anion gap: 10 (ref 5–15)
BUN: 30 mg/dL — ABNORMAL HIGH (ref 8–23)
CO2: 22 mmol/L (ref 22–32)
Calcium: 8 mg/dL — ABNORMAL LOW (ref 8.9–10.3)
Chloride: 103 mmol/L (ref 98–111)
Creatinine, Ser: 0.85 mg/dL (ref 0.61–1.24)
GFR, Estimated: >60 mL/min (ref >60)
Glucose, Bld: 106 mg/dL — ABNORMAL HIGH (ref 70–99)
Potassium: 3.7 mmol/L (ref 3.5–5.1)
Sodium: 135 mmol/L (ref 135–145)

## 2023-04-03 LAB — MAGNESIUM: Magnesium: 2.1 mg/dL (ref 1.7–2.4)

## 2023-04-03 MED ORDER — IPRATROPIUM-ALBUTEROL 0.5-2.5 (3) MG/3ML IN SOLN: 3.0000 mL | Freq: Four times a day (QID) | RESPIRATORY_TRACT | Status: DC | PRN

## 2023-04-03 MED ORDER — IPRATROPIUM-ALBUTEROL 0.5-2.5 (3) MG/3ML IN SOLN: RESPIRATORY_TRACT | 0 refills | Status: AC

## 2023-04-03 MED ORDER — HYDROCODONE-ACETAMINOPHEN 5-325 MG PO TABS
1.0000 | ORAL_TABLET | Freq: Four times a day (QID) | ORAL | Status: DC | PRN
  Administered 2023-04-03: 1 via ORAL
  Filled 2023-04-03: qty 1

## 2023-04-03 MED ORDER — DOCUSATE SODIUM 100 MG PO CAPS: 100.0000 mg | ORAL_CAPSULE | Freq: Two times a day (BID) | ORAL | 0 refills | Status: DC

## 2023-04-03 NOTE — Progress Notes (Signed)
 Notified Cory Gonzalez, [place], Pts. wife at bedside, and concerned about Pt. having significant drainage from wound when he is supposed to be going to facility this afternoon.  Per provider, this is likely because he is back on his blood thinner, and no interventions indicated at this time.

## 2023-04-03 NOTE — Progress Notes (Signed)
 Mobility Specialist Progress Note:    04/03/23 1437  Mobility  Activity Transferred from bed to chair  Level of Assistance Moderate assist, patient does 50-74% (+2)  Assistive Device Other (Comment) (HHA)  Distance Ambulated (ft) 3 ft  LLE Weight Bearing Per Provider Order WBAT  Activity Response Tolerated well  Mobility Referral Yes  Mobility visit 1 Mobility  Mobility Specialist Start Time (ACUTE ONLY) 1130  Mobility Specialist Stop Time (ACUTE ONLY) 1146  Mobility Specialist Time Calculation (min) (ACUTE ONLY) 16 min   Pt received in bed requesting to get up in chair. Pt required ModA w/ bed mobility and ModA +2 w/HHA for STS and taking steps towards the chair. Pt required max multimodal cues to ambulate. Situated in chair w/ call bell and personal belongings in reach. All needs met. Chair alarm on.  Thompson Grayer Mobility Specialist  Please contact vis Secure Chat or  Rehab Office [phone]

## 2023-04-03 NOTE — Progress Notes (Signed)
 Report called to Coralee North, LPN at [facility], [place]

## 2023-04-03 NOTE — Discharge Summary (Signed)
 Physician Discharge Summary   Patient: Cory Gonzalez MRN: [id number] DOB: 1929-12-28  Admit date:     03/30/2023  Discharge date: 04/03/23  Discharge Physician: Lynden Oxford  PCP: [facility]  Recommendations at discharge: Follow up with PCP.  Repeat chest x ray in 2 weeks.  Repeat CBC BMP in 2 weeks.  Follow up with Orthopedics as recommended.    Follow-up Information     [place], [facility], [employer]. Schedule an appointment as soon as possible for a visit on 04/12/2023.   Specialty: Orthopedic Surgery Contact information: [address] [place] [address] [phone]         [facility], [facility]. Schedule an appointment as soon as possible for a visit in 1 week(s).   Specialty: Rheumatology Why: with Repeat Chest X ray in 2 weeks Contact information: [address] [place] [address] [phone]                Discharge Diagnoses: Principal Problem:   Closed left hip fracture [facility]) Active Problems:   Acute postoperative anemia due to expected blood loss   HTN (hypertension)   Rheumatoid arthritis (HCC)   Atrial fibrillation, chronic (HCC)   CKD stage 3a, GFR 45-59 ml/min (HCC)   BPH (benign prostatic hyperplasia)   Current use of long term anticoagulation   Aortic valve stenosis  Hospital Course: 1 left hip intertrochanteric proximal femur fracture -Secondary to mechanical fall. -Patient on presentation underwent plain films of the left hip and pelvis which showed an acute commuted intertrochanteric fracture of the left femur. -Patient seen in consultation by orthopedics and patient underwent IM nail, 03/31/2023. -Pain management per orthopedics. -Orthopedics following appreciate input and recommendations.   2 postop acute blood loss anemia -Hemoglobin trending down currently at 7.7 today from 10.3 on admission. -Patient with no overt bleeding. -Transfuse 1 unit PRBCs. -Transfusion threshold hemoglobin < 8.    3.  Bradycardia -Patient seen in consultation by cardiology who felt may have been related to choking or hypoxia as bradycardia improved after coughing and clearing his airway. -Repeat 2D echo with a EF of 60 to 65%, NWMA, mildly reduced right ventricular systolic function, mild AVR, aortic valve sclerosis/calcification is present without any evidence of aortic stenosis. -Was being followed by cardiology who have signed off as of 04/01/2023.   4.  Mild aortic regurgitation. -Known history of cardiac murmur for several years. -2D echo from 2021 with mild AS. -Repeat 2D echo with a EF of 60 to 65%, NWMA, mildly reduced right ventricular systolic function, mild AVR, aortic valve sclerosis/calcification is present without any evidence of aortic stenosis.  -Cardiology was following but is signed off as of 04/01/2023.   5.  Chronic A-fib -Rate controlled without any meds prior to admission.  -Xarelto for anticoagulation.     6.  CKD 3a -Stable.   7.  Hypertension Blood pressure was soft post op.  Now better.  On multiple antihypertensive, all were on hold.  Would hold for now. Can resume norvasc as needed down the road For now starting flomax.   8.  Rheumatoid arthritis -Patient noted to be on infliximab every 8 weeks, methotrexate every Friday (last dose 03/29/2023). Can start on 04/11/2023 -Continue prednisone.   9.  Wheezing Hypoxia due to atelectasis and possible undiagnosed OSA  Chest x ray on admission and on 3/4 showed Persistent left retrocardiac opacity. Chest x ray on 3/4 showed Increasing patchy airspace opacities at the right lung base . Mostly post op atelectasis.  Patient with prior history of tobacco use. Continue DuoNebs 3 times daily and PRN.  Recommendation repeat x ray to ensure resolution.    10.  Hypokalemia -Repleted  11. Confusion  Pt appear to have developed some confusion after the fall. Per wife he has not shown any clear signs of cognitive decline prior to  fall but since fall has been confused. Per RN no agitation and confusion has remained largely unchanged.  CT on admission No evidence of acute intracranial or cervical spine injury. 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 No focal deficit for me.  For now suspect this is combination of post op and hospital induced delirium and anticipate it will improve as the time progresses. Recommended close follow up. But no further work up necessary.    Consultants:  Orthopedics: Dr. Ave Filter 03/30/2023 Cardiology: Dr. Rennis Golden 03/30/2023   Procedures:  2D echo 04/01/2023 CT head CT C-spine 03/30/2023 Plain films of the left hip and pelvis 03/30/2023 Chest x-ray 03/30/2023. IM nail left femur 03/31/2023 per orthopedics: Dr. Ave Filter Transfuse 1 unit PRBCs 04/02/2023  Pain control - [facility] Controlled Substance Reporting System database was reviewed. and patient was instructed, not to drive, operate heavy machinery, perform activities at heights, swimming or participation in water activities or provide baby-sitting services while on Pain, Sleep and Anxiety Medications; until their outpatient Physician has advised to do so again. Also recommended to not to take more than prescribed Pain, Sleep and Anxiety Medications.   DISCHARGE MEDICATION: Allergies as of 04/03/2023   No Known Allergies      Medication List     STOP taking these medications    amLODipine 10 MG tablet Commonly known as: NORVASC   gabapentin 100 MG capsule Commonly known as: NEURONTIN   lisinopril-hydrochlorothiazide 20-12.5 MG tablet Commonly known as: ZESTORETIC       TAKE these medications    acetaminophen 500 MG tablet Commonly known as: TYLENOL Take 1,000 mg by mouth every 6 (six) hours as needed for moderate pain.   Centrum Silver 50+Men Tabs Take 1 tablet by mouth daily.   docusate sodium 100 MG capsule Commonly known as: COLACE Take 1 capsule (100 mg total) by  mouth 2 (two) times daily.   finasteride 5 MG tablet Commonly known as: PROSCAR Take 5 mg by mouth every evening.   folic acid 1 MG tablet Commonly known as: FOLVITE Take 3 mg by mouth daily.   HYDROcodone-acetaminophen 5-325 MG tablet Commonly known as: NORCO/VICODIN Take 1 tablet by mouth every 6 (six) hours as needed for moderate pain (pain score 4-6).   ipratropium-albuterol 0.5-2.5 (3) MG/3ML Soln Commonly known as: DUONEB Take 3 mLs by nebulization 3 (three) times daily for 7 days, THEN 3 mLs every 6 (six) hours as needed for up to 14 days. Start taking on: April 03, 2023   latanoprost 0.005 % ophthalmic solution Commonly known as: XALATAN Place 1 drop into both eyes at bedtime.   methotrexate 2.5 MG tablet Commonly known as: RHEUMATREX Take 10 mg by mouth every Friday.   predniSONE 5 MG tablet Commonly known as: DELTASONE Take 5 mg by mouth daily before breakfast.   REMICADE IV Inject into the vein every 8 (eight) weeks.   Rivaroxaban 15 MG Tabs tablet Commonly known as: XARELTO Take 1 tablet (15 mg total) by mouth daily with supper. What changed: when to take this   tamsulosin 0.4 MG Caps capsule Commonly known as: FLOMAX Take 0.4 mg  by mouth every evening.               Durable Medical Equipment  (From admission, onward)           Start     Ordered   04/02/23 0825  For home use only DME 4 wheeled rolling walker with seat  Once       Question:  Patient needs a walker to treat with the following condition  Answer:  Hip fracture (HCC)   04/02/23 0824   04/02/23 0825  For home use only DME 3 n 1  Once        04/02/23 0824              Discharge Care Instructions  (From admission, onward)           Start     Ordered   04/03/23 0000  Discharge wound care:       Comments: Reinforce dressing   04/03/23 1127   04/01/23 0000  Weight bearing as tolerated        04/01/23 0820           Disposition: SNF Diet recommendation:  Cardiac diet  Discharge Exam: Vitals:   04/03/23 0446 04/03/23 0848 04/03/23 0849 04/03/23 1114  BP: (!) 127/96  (!) 145/84   Pulse: 92  89   Resp:   16   Temp: (!) 97.5 F (36.4 C)  98.3 F (36.8 C)   TempSrc: Axillary  Oral   SpO2: 94% (!) 78% 98% 94%  Weight:      Height:       General: Appear in mild distress; no visible Abnormal Neck Mass Or lumps, Conjunctiva normal Cardiovascular: S1 and S2 Present, aortic systolic  Murmur, Respiratory: good respiratory effort, Bilateral Air entry present and faint basal Crackles, no wheezes Abdomen: Bowel Sound present, Non tender  Extremities: no Pedal edema Neurology: alert and oriented to self no focal deficit.  Filed Weights   03/30/23 0848 03/30/23 1447 03/31/23 0819  Weight: 63.5 kg 68.9 kg 68.9 kg   Condition at discharge: stable  The results of significant diagnostics from this hospitalization (including imaging, microbiology, ancillary and laboratory) are listed below for reference.   Imaging Studies: DG CHEST PORT 1 VIEW Result Date: 04/02/2023 CLINICAL DATA:  Leukocytosis EXAM: PORTABLE CHEST - 1 VIEW COMPARISON:  03/30/2023 FINDINGS: Low lung volumes. Persistent left retrocardiac opacity. Increasing patchy airspace opacities at the right lung base . Heart size and mediastinal contours are within normal limits. Aortic Atherosclerosis (ICD10-170.0). Blunting of right lateral costophrenic angle. Visualized bones unremarkable. IMPRESSION: Low volumes with bibasilar opacities Electronically Signed   By: Corlis Leak M.D.   On: 04/02/2023 13:14   ECHOCARDIOGRAM COMPLETE Result Date: 04/01/2023    ECHOCARDIOGRAM REPORT   Patient Name:   VADEN BECHERER Date of Exam: 04/01/2023 Medical Rec #:  [id number]   Height:       65.0 in Accession #:    [id number]  Weight:       151.9 lb Date of Birth:  [birth year]   BSA:          1.760 m Patient Age:    89 years    BP:           105/58 mmHg Patient Gender: M           HR:           88 bpm. Exam Location:   Inpatient Procedure: 2D Echo, Cardiac Doppler and Color Doppler (  Both Spectral and Color            Flow Doppler were utilized during procedure). Indications:    Aortic stenosis; Mitral Valve regurg  History:        Patient has prior history of Echocardiogram examinations, most                 recent 07/30/2019. [id number], Aortic Valve Disease and Mitral Valve                 Disease; Risk Factors:Current Smoker and Hypertension.  Sonographer:    Dondra Prader RVT RCS Referring Phys: [phone] [facility] [place] IMPRESSIONS  1. Left ventricular ejection fraction, by estimation, is 60 to 65%. The left ventricle has normal function. The left ventricle has no regional wall motion abnormalities. Left ventricular diastolic function could not be evaluated. Elevated left atrial pressure.  2. Right ventricular systolic function is mildly reduced. The right ventricular size is normal. There is mildly elevated pulmonary artery systolic pressure. The estimated right ventricular systolic pressure is 40.3 mmHg.  3. The mitral valve is normal in structure. Mild mitral valve regurgitation. No evidence of mitral stenosis.  4. The aortic valve is normal in structure. There is moderate calcification of the aortic valve. There is moderate thickening of the aortic valve. Aortic valve regurgitation is mild. Aortic valve sclerosis/calcification is present, without any evidence of aortic stenosis. Aortic valve area, by VTI measures 1.80 cm. Aortic valve mean gradient measures 5.2 mmHg. Aortic valve Vmax measures 1.54 m/s. FINDINGS  Left Ventricle: Left ventricular ejection fraction, by estimation, is 60 to 65%. The left ventricle has normal function. The left ventricle has no regional wall motion abnormalities. The left ventricular internal cavity size was normal in size. There is  no left ventricular hypertrophy. Left ventricular diastolic function could not be evaluated due to mitral annular calcification (moderate or greater). Left ventricular  diastolic function could not be evaluated. Elevated left atrial pressure. Right Ventricle: The right ventricular size is normal. No increase in right ventricular wall thickness. Right ventricular systolic function is mildly reduced. There is mildly elevated pulmonary artery systolic pressure. The tricuspid regurgitant velocity  is 2.84 m/s, and with an assumed right atrial pressure of 8 mmHg, the estimated right ventricular systolic pressure is 40.3 mmHg. Left Atrium: Left atrial size was normal in size. Right Atrium: Right atrial size was normal in size. Pericardium: There is no evidence of pericardial effusion. Mitral Valve: The mitral valve is normal in structure. Mild to moderate mitral annular calcification. Mild mitral valve regurgitation. No evidence of mitral valve stenosis. The mean mitral valve gradient is 2.0 mmHg. Tricuspid Valve: The tricuspid valve is normal in structure. Tricuspid valve regurgitation is not demonstrated. No evidence of tricuspid stenosis. Aortic Valve: The aortic valve is normal in structure. There is moderate calcification of the aortic valve. There is moderate thickening of the aortic valve. Aortic valve regurgitation is mild. Aortic valve sclerosis/calcification is present, without any  evidence of aortic stenosis. Aortic valve mean gradient measures 5.2 mmHg. Aortic valve peak gradient measures 9.5 mmHg. Aortic valve area, by VTI measures 1.80 cm. Pulmonic Valve: The pulmonic valve was normal in structure. Pulmonic valve regurgitation is trivial. No evidence of pulmonic stenosis. Aorta: The aortic root is normal in size and structure. Venous: The inferior vena cava was not well visualized. IAS/Shunts: No atrial level shunt detected by color flow Doppler.  LEFT VENTRICLE PLAX 2D LVIDd:         4.00 cm  Diastology LVIDs:         2.80 cm   LV e' medial:    6.75 cm/s LV PW:         1.20 cm   LV E/e' medial:  16.4 LV IVS:        1.40 cm   LV e' lateral:   12.10 cm/s LVOT diam:      2.00 cm   LV E/e' lateral: 9.2 LV SV:         43 LV SV Index:   24 LVOT Area:     3.14 cm  RIGHT VENTRICLE RV Basal diam:  3.80 cm RV S prime:     11.00 cm/s TAPSE (M-mode): 2.0 cm LEFT ATRIUM             Index        RIGHT ATRIUM           Index LA Vol (A2C):   62.4 ml 35.46 ml/m  RA Area:     13.20 cm LA Vol (A4C):   59.0 ml 33.53 ml/m  RA Volume:   36.60 ml  20.80 ml/m LA Biplane Vol: 62.8 ml 35.68 ml/m  AORTIC VALVE                     PULMONIC VALVE AV Area (Vmax):    1.55 cm      PV Vmax:       0.87 m/s AV Area (Vmean):   1.61 cm      PV Peak grad:  3.1 mmHg AV Area (VTI):     1.80 cm AV Vmax:           153.75 cm/s AV Vmean:          101.975 cm/s AV VTI:            0.239 m AV Peak Grad:      9.5 mmHg AV Mean Grad:      5.2 mmHg LVOT Vmax:         75.78 cm/s LVOT Vmean:        52.120 cm/s LVOT VTI:          0.137 m LVOT/AV VTI ratio: 0.57  AORTA Ao Root diam: 3.10 cm Ao Asc diam:  3.30 cm MITRAL VALVE                TRICUSPID VALVE MV Area (PHT): 4.57 cm     TR Peak grad:   32.3 mmHg MV Mean grad:  2.0 mmHg     TR Vmax:        284.00 cm/s MV Decel Time: 166 msec MV E velocity: 111.00 cm/s  SHUNTS MV A velocity: 35.00 cm/s   Systemic VTI:  0.14 m MV E/A ratio:  3.17         Systemic Diam: 2.00 cm Clearnce Hasten Electronically signed by Clearnce Hasten Signature Date/Time: 04/01/2023/10:06:25 AM    Final    DG FEMUR MIN 2 VIEWS LEFT Result Date: 03/31/2023 CLINICAL DATA:  Intraoperative fluoroscopy for femoral fixation. EXAM: LEFT FEMUR 2 VIEWS FLUOROSCOPY: Fluoroscopy Time:  83.8 seconds Radiation Exposure Index (if provided by the fluoroscopic device): 14.53 mGy Number of Acquired Spot Images: 7 COMPARISON:  Hip radiographs dated 03/30/2023. FINDINGS: Intraoperative fluoroscopy images demonstrate femoral intramedullary nail and femoral neck screw across an intertrochanteric fracture of the femur. The bones are in improved alignment. IMPRESSION: Intraoperative fluoroscopy for femoral fixation.  Electronically Signed   By: Foye Spurling.D.  On: 03/31/2023 13:59   DG C-Arm 1-60 Min-No Report Result Date: 03/31/2023 Fluoroscopy was utilized by the requesting physician.  No radiographic interpretation.   DG Chest 1 View Result Date: 03/30/2023 CLINICAL DATA:  Status post fall.  Possible vomiting. EXAM: CHEST  1 VIEW COMPARISON:  Chest CT 09/12/2020. Radiographs 07/27/2019 and 10/09/2017. FINDINGS: 0914 hours. Evaluation of the left lung base is limited by what appears to be the patient's overlying left hand. The heart size and mediastinal contours are stable for technique. There is mild aortic atherosclerosis and a hiatal hernia. Chronic central airway thickening and scarring at both lung bases without definite superimposed airspace disease, pneumothorax or significant pleural effusion. No acute osseous findings are evident. There is evidence of a chronic rotator cuff tear on the right. IMPRESSION: No definite acute cardiopulmonary process. Chronic central airway thickening and scarring at both lung bases. Limited evaluation of the left lung base due to the patient's overlapping hand. Electronically Signed   By: Carey Bullocks M.D.   On: 03/30/2023 10:17   DG Hip Unilat With Pelvis 2-3 Views Left Result Date: 03/30/2023 CLINICAL DATA:  Status post fall.  Pain. EXAM: DG HIP (WITH OR WITHOUT PELVIS) 2-3V LEFT COMPARISON:  None Available. FINDINGS: There is in acute, comminuted fracture deformity involving the intertrochanteric portions of the proximal left femur. There is medial angulation of the distal fracture fragments. No dislocation. Mild degenerative changes noted within both hips. Signs of penile prosthesis. IMPRESSION: Acute, comminuted intertrochanteric fracture deformity of the proximal left femur. Electronically Signed   By: Signa Kell M.D.   On: 03/30/2023 10:13   CT Cervical Spine Wo Contrast Result Date: 03/30/2023 CLINICAL DATA:  Neck trauma.  Head trauma. EXAM: CT HEAD WITHOUT  CONTRAST CT CERVICAL SPINE WITHOUT CONTRAST TECHNIQUE: Multidetector CT imaging of the head and cervical spine was performed following the standard protocol without intravenous contrast. Multiplanar CT image reconstructions of the cervical spine were also generated. RADIATION DOSE REDUCTION: This exam was performed according to the departmental dose-optimization program which includes automated exposure control, adjustment of the mA and/or kV according to patient size and/or use of iterative reconstruction technique. 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 Small areas of cortical encephalomalacia in the left inferior temporal and low right parietal cortex. Low-density in the cerebral white matter attributed to chronic small vessel ischemia. No evidence of acute hemorrhage, hydrocephalus, mass, or collection. Vascular: No hyperdense vessel or unexpected calcification. Skull: Normal. Negative for fracture or focal lesion. Sinuses/Orbits: Negative CT CERVICAL SPINE FINDINGS Alignment: No traumatic malalignment Skull base and vertebrae: No acute fracture Soft tissues and spinal canal: No prevertebral fluid or swelling. No visible canal hematoma. Disc levels:  Generalized degenerative endplate and facet spurring. Upper chest: Emphysema. IMPRESSION: 1. No evidence of acute intracranial or cervical spine injury. 2. 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 Electronically Signed   By: Tiburcio Pea M.D.   On: 03/30/2023 10:12   CT Head Wo Contrast Result Date: 03/30/2023 CLINICAL DATA:  Neck trauma.  Head trauma. EXAM: CT HEAD WITHOUT CONTRAST CT CERVICAL SPINE WITHOUT CONTRAST TECHNIQUE: Multidetector CT imaging of the head and cervical spine was performed following the standard protocol without intravenous contrast. Multiplanar CT image reconstructions of the cervical  spine were also generated. RADIATION DOSE REDUCTION: This exam was performed according to the departmental dose-optimization program which includes automated exposure control, adjustment  of the mA and/or kV according to patient size and/or use of iterative reconstruction technique. 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 Small areas of cortical encephalomalacia in the left inferior temporal and low right parietal cortex. Low-density in the cerebral white matter attributed to chronic small vessel ischemia. No evidence of acute hemorrhage, hydrocephalus, mass, or collection. Vascular: No hyperdense vessel or unexpected calcification. Skull: Normal. Negative for fracture or focal lesion. Sinuses/Orbits: Negative CT CERVICAL SPINE FINDINGS Alignment: No traumatic malalignment Skull base and vertebrae: No acute fracture Soft tissues and spinal canal: No prevertebral fluid or swelling. No visible canal hematoma. Disc levels:  Generalized degenerative endplate and facet spurring. Upper chest: Emphysema. IMPRESSION: 1. No evidence of acute intracranial or cervical spine injury. 2. 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 Electronically Signed   By: Tiburcio Pea M.D.   On: 03/30/2023 10:12    Microbiology: Results for orders placed or performed during the hospital encounter of 03/30/23  Surgical PCR screen     Status: None   Collection Time: 03/30/23  4:08 PM   Specimen: Nasal Mucosa; Nasal Swab  Result Value Ref Range Status   MRSA, PCR NEGATIVE NEGATIVE Final   Staphylococcus aureus NEGATIVE NEGATIVE Final    Comment: (NOTE) The Xpert SA Assay (FDA approved for NASAL specimens in patients 60 years of age and older), is one component of a comprehensive surveillance program. It is not intended to diagnose infection nor to guide or monitor  treatment. Performed at [facility], [address], [place], [place] [address]   Urine Culture (for pregnant, neutropenic or urologic patients or patients with an indwelling urinary catheter)     Status: None   Collection Time: 03/31/23  8:20 AM   Specimen: Urine, Clean Catch  Result Value Ref Range Status   Specimen Description URINE, CLEAN CATCH  Final   Special Requests NONE  Final   Culture   Final    NO GROWTH Performed at [facility], [address], [place], [place] [address]    Report Status 04/01/2023 FINAL  Final   Labs: CBC: Recent Labs  Lab 03/30/23 0857 03/31/23 0502 04/01/23 0622 04/02/23 0756 04/02/23 1803 04/03/23 0618  WBC 9.7 13.6* 14.7* 13.4*  --  10.4  HGB 10.3* 9.3* 7.7* 7.7* 8.8* 8.9*  HCT 32.0* 27.6* 22.8* 23.1* 25.3* 25.3*  MCV 95.5 92.9 92.3 93.1  --  90.4  PLT 256 239 210 226  --  217   Basic Metabolic Panel: Recent Labs  Lab 03/30/23 0857 03/31/23 0502 04/01/23 0622 04/02/23 0756 04/03/23 0618  NA 136 136 133* 133* 135  K 4.2 3.8 3.9 3.4* 3.7  CL 103 98 100 103 103  CO2 22 21* 21* 23 22  GLUCOSE 150* 159* 117* 108* 106*  BUN 21 22 33* 35* 30*  CREATININE 1.17 1.11 1.19 0.97 0.85  CALCIUM 8.7* 8.8* 8.3* 8.0* 8.0*  MG  --   --  2.0  --  2.1   Liver Function Tests: Recent Labs  Lab 03/30/23 0857  AST 31  ALT 22  ALKPHOS 61  BILITOT 0.7  PROT 5.9*  ALBUMIN 3.3*   CBG: No results for input(s): "GLUCAP" in the last 168 hours.  Discharge time spent: greater than 30 minutes.  Author: Lynden Oxford, MD  [facility]

## 2023-04-03 NOTE — Progress Notes (Signed)
 PATIENT ID: Cory Gonzalez  MRN: [id number]  DOB/AGE:  10-04-1929 / 89 y.o.  3 Days Post-Op Procedure(s) (LRB): INTRAMEDULLARY (IM) NAIL INTERTROCHANTERIC (Left)  Subjective: Patient reports mild discomfort in left hip. Seems to have some confusion. Wife not in the room  Objective: Vital signs in last 24 hours: Temp:  [97.5 F (36.4 C)-99.5 F (37.5 C)] 98.3 F (36.8 C) (03/05 0849) Pulse Rate:  [71-92] 89 (03/05 0849) Resp:  [16-18] 16 (03/05 0849) BP: (118-145)/(71-96) 145/84 (03/05 0849) SpO2:  [94 %-100 %] 98 % (03/05 0849)  Intake/Output from previous day: 03/04 0701 - 03/05 0700 In: 318 [I.V.:3; Blood:315] Out: 200 [Urine:200] Intake/Output this shift: No intake/output data recorded.  Recent Labs    04/01/23 0622 04/02/23 0756 04/02/23 1803 04/03/23 0618  HGB 7.7* 7.7* 8.8* 8.9*   Recent Labs    04/02/23 0756 04/02/23 1803 04/03/23 0618  WBC 13.4*  --  10.4  RBC 2.48*  --  2.80*  HCT 23.1* 25.3* 25.3*  PLT 226  --  217   Recent Labs    04/02/23 0756 04/03/23 0618  NA 133* 135  K 3.4* 3.7  CL 103 103  CO2 23 22  BUN 35* 30*  CREATININE 0.97 0.85  GLUCOSE 108* 106*  CALCIUM 8.0* 8.0*    Physical Exam: Sensation intact distally Intact pulses distally Dorsiflexion/Plantar flexion intact Incision: moderate drainage from proximal incision, dressing changed No cellulitis present Compartment soft  Assessment/Plan: 3 Days Post-Op Procedure(s) (LRB): INTRAMEDULLARY (IM) NAIL INTERTROCHANTERIC (Left)   Advance diet Up with therapy Discharge to SNF when stable and beds arrangements made Weight Bearing as Tolerated (WBAT) VTE prophylaxis: resume Xarelto     CSW working on SNF placement. Patient will need continued PT at rehab. Attempted dressing change today but did not have assistance and patient could not get into appropriate position. RN will change dressing once patient gets up. Follow up in office on 3/14. Rx for pain on chart.  Amadea Keagy L.  [place], PA-C 04/03/2023, 9:12 AM

## 2023-04-03 NOTE — TOC Transition Note (Signed)
 Transition of Care [facility]) - Discharge Note   Patient Details  Name: Cory Gonzalez MRN: [id number] Date of Birth: [birth year]  Transition of Care [facility]) CM/SW Contact:  Lorri Frederick, LCSW Phone Number: 04/03/2023, 12:53 PM   Clinical Narrative:  [facility] community room 802B.  RN call report to [phone]. (Main number: [phone].)  PTAR has been scheduled for 4pm.      Final next level of care: Skilled Nursing Facility Barriers to Discharge: Barriers Resolved   Patient Goals and CMS Choice   CMS Medicare.gov Compare Post Acute Care list provided to:: Patient Represenative (must comment) (wife shirley) Choice offered to / list presented to : Spouse      Discharge Placement              [facility] Patient to be transferred to facility by: PTAR Name of family member notified: wife [name] Patient and family notified of of transfer: 04/03/23  Discharge Plan and Services Additional resources added to the After Visit Summary for   In-house Referral: Clinical Social Work   Post Acute Care Choice: Skilled Nursing Facility                               Social Drivers of Health (SDOH) Interventions SDOH Screenings   Food Insecurity: No Food Insecurity (03/30/2023)  Housing: Low Risk  (03/30/2023)  Transportation Needs: Unmet Transportation Needs (03/30/2023)  Utilities: Not At Risk (03/30/2023)  Depression (PHQ2-9): Low Risk  (09/16/2019)  Social Connections: Moderately Integrated (03/30/2023)  Tobacco Use: High Risk (03/31/2023)     Readmission Risk Interventions     No data to display

## 2023-04-03 NOTE — TOC Progression Note (Addendum)
 Transition of Care [facility]) - Progression Note    Patient Details  Name: Cory Gonzalez MRN: [id number] Date of Birth: [birth year]  [facility]) CM/SW Contact  Lorri Frederick, LCSW Phone Number: 04/03/2023, 11:03 AM  Clinical Narrative:   CSW provided bed offers to pt wife [name] and she accepts offer at [facility].  Per [name], remiciad is not due until the end of March.  CSW confirmed with [facility] that they can receive pt today.  Also informed her of remicaid date.    Medicare payer with inpt order 03/30/23. MD informed.   1200: SNF asking for 4pm pickup.  Expected Discharge Plan: Skilled Nursing Facility Barriers to Discharge: Continued Medical Work up, SNF Pending bed offer  Expected Discharge Plan and Services In-house Referral: Clinical Social Work   Post Acute Care Choice: Skilled Nursing Facility Living arrangements for the past 2 months: Single Family Home                                       Social Determinants of Health (SDOH) Interventions SDOH Screenings   Food Insecurity: No Food Insecurity (03/30/2023)  Housing: Low Risk  (03/30/2023)  Transportation Needs: Unmet Transportation Needs (03/30/2023)  Utilities: Not At Risk (03/30/2023)  Depression (PHQ2-9): Low Risk  (09/16/2019)  Social Connections: Moderately Integrated (03/30/2023)  Tobacco Use: High Risk (03/31/2023)    Readmission Risk Interventions     No data to display

## 2023-04-04 DIAGNOSIS — I482 Chronic atrial fibrillation, unspecified: Secondary | ICD-10-CM | POA: Diagnosis not present

## 2023-04-04 DIAGNOSIS — I10 Essential (primary) hypertension: Secondary | ICD-10-CM | POA: Diagnosis not present

## 2023-04-04 DIAGNOSIS — I5032 Chronic diastolic (congestive) heart failure: Secondary | ICD-10-CM | POA: Diagnosis not present

## 2023-04-04 DIAGNOSIS — N400 Enlarged prostate without lower urinary tract symptoms: Secondary | ICD-10-CM | POA: Diagnosis not present

## 2023-04-04 DIAGNOSIS — D62 Acute posthemorrhagic anemia: Secondary | ICD-10-CM | POA: Diagnosis not present

## 2023-04-04 DIAGNOSIS — N1831 Chronic kidney disease, stage 3a: Secondary | ICD-10-CM | POA: Diagnosis not present

## 2023-04-04 DIAGNOSIS — M069 Rheumatoid arthritis, unspecified: Secondary | ICD-10-CM | POA: Diagnosis not present

## 2023-04-09 DIAGNOSIS — I10 Essential (primary) hypertension: Secondary | ICD-10-CM | POA: Diagnosis not present

## 2023-04-09 DIAGNOSIS — I5032 Chronic diastolic (congestive) heart failure: Secondary | ICD-10-CM | POA: Diagnosis not present

## 2023-04-12 DIAGNOSIS — S72142A Displaced intertrochanteric fracture of left femur, initial encounter for closed fracture: Secondary | ICD-10-CM | POA: Diagnosis not present

## 2023-04-17 DIAGNOSIS — F4320 Adjustment disorder, unspecified: Secondary | ICD-10-CM | POA: Diagnosis not present

## 2023-04-17 DIAGNOSIS — K5900 Constipation, unspecified: Secondary | ICD-10-CM | POA: Diagnosis not present

## 2023-04-17 DIAGNOSIS — I482 Chronic atrial fibrillation, unspecified: Secondary | ICD-10-CM | POA: Diagnosis not present

## 2023-04-17 DIAGNOSIS — I5032 Chronic diastolic (congestive) heart failure: Secondary | ICD-10-CM | POA: Diagnosis not present

## 2023-04-19 DIAGNOSIS — R0602 Shortness of breath: Secondary | ICD-10-CM | POA: Diagnosis not present

## 2023-04-19 DIAGNOSIS — I4891 Unspecified atrial fibrillation: Secondary | ICD-10-CM | POA: Diagnosis not present

## 2023-04-19 DIAGNOSIS — Z9889 Other specified postprocedural states: Secondary | ICD-10-CM | POA: Diagnosis not present

## 2023-04-19 DIAGNOSIS — J9601 Acute respiratory failure with hypoxia: Secondary | ICD-10-CM | POA: Diagnosis not present

## 2023-04-19 DIAGNOSIS — Z66 Do not resuscitate: Secondary | ICD-10-CM | POA: Diagnosis not present

## 2023-04-19 DIAGNOSIS — I509 Heart failure, unspecified: Secondary | ICD-10-CM | POA: Diagnosis not present

## 2023-04-19 DIAGNOSIS — I5082 Biventricular heart failure: Secondary | ICD-10-CM | POA: Diagnosis not present

## 2023-04-19 DIAGNOSIS — J90 Pleural effusion, not elsewhere classified: Secondary | ICD-10-CM | POA: Diagnosis not present

## 2023-04-19 DIAGNOSIS — I110 Hypertensive heart disease with heart failure: Secondary | ICD-10-CM | POA: Diagnosis not present

## 2023-04-19 DIAGNOSIS — R918 Other nonspecific abnormal finding of lung field: Secondary | ICD-10-CM | POA: Diagnosis not present

## 2023-04-19 DIAGNOSIS — U071 COVID-19: Secondary | ICD-10-CM | POA: Diagnosis not present

## 2023-04-19 DIAGNOSIS — I469 Cardiac arrest, cause unspecified: Secondary | ICD-10-CM | POA: Diagnosis not present

## 2023-04-19 DIAGNOSIS — M129 Arthropathy, unspecified: Secondary | ICD-10-CM | POA: Diagnosis not present

## 2023-04-30 DEATH — deceased
